# Patient Record
Sex: Female | Born: 1945 | Race: White | Hispanic: No | Smoking: Current every day smoker
Health system: Southern US, Community
[De-identification: ages and names within clinical notes are randomized; demographics above are authoritative.]

## PROBLEM LIST (undated history)

## (undated) DIAGNOSIS — M109 Gout, unspecified: Secondary | ICD-10-CM

## (undated) DIAGNOSIS — G8929 Other chronic pain: Secondary | ICD-10-CM

## (undated) DIAGNOSIS — J45909 Unspecified asthma, uncomplicated: Secondary | ICD-10-CM

## (undated) DIAGNOSIS — G2581 Restless legs syndrome: Secondary | ICD-10-CM

## (undated) DIAGNOSIS — K5792 Diverticulitis of intestine, part unspecified, without perforation or abscess without bleeding: Secondary | ICD-10-CM

## (undated) DIAGNOSIS — Z9289 Personal history of other medical treatment: Secondary | ICD-10-CM

## (undated) DIAGNOSIS — E039 Hypothyroidism, unspecified: Secondary | ICD-10-CM

## (undated) DIAGNOSIS — K760 Fatty (change of) liver, not elsewhere classified: Secondary | ICD-10-CM

## (undated) DIAGNOSIS — M545 Low back pain, unspecified: Secondary | ICD-10-CM

## (undated) DIAGNOSIS — IMO0001 Reserved for inherently not codable concepts without codable children: Secondary | ICD-10-CM

## (undated) DIAGNOSIS — E119 Type 2 diabetes mellitus without complications: Secondary | ICD-10-CM

## (undated) DIAGNOSIS — K5909 Other constipation: Secondary | ICD-10-CM

## (undated) DIAGNOSIS — F329 Major depressive disorder, single episode, unspecified: Secondary | ICD-10-CM

## (undated) DIAGNOSIS — M1990 Unspecified osteoarthritis, unspecified site: Secondary | ICD-10-CM

## (undated) DIAGNOSIS — F419 Anxiety disorder, unspecified: Secondary | ICD-10-CM

## (undated) DIAGNOSIS — C73 Malignant neoplasm of thyroid gland: Secondary | ICD-10-CM

## (undated) DIAGNOSIS — F17210 Nicotine dependence, cigarettes, uncomplicated: Secondary | ICD-10-CM

## (undated) DIAGNOSIS — D509 Iron deficiency anemia, unspecified: Secondary | ICD-10-CM

## (undated) DIAGNOSIS — E785 Hyperlipidemia, unspecified: Secondary | ICD-10-CM

## (undated) DIAGNOSIS — E1140 Type 2 diabetes mellitus with diabetic neuropathy, unspecified: Secondary | ICD-10-CM

## (undated) DIAGNOSIS — J449 Chronic obstructive pulmonary disease, unspecified: Secondary | ICD-10-CM

## (undated) DIAGNOSIS — R251 Tremor, unspecified: Secondary | ICD-10-CM

## (undated) DIAGNOSIS — M722 Plantar fascial fibromatosis: Secondary | ICD-10-CM

## (undated) DIAGNOSIS — K219 Gastro-esophageal reflux disease without esophagitis: Secondary | ICD-10-CM

## (undated) DIAGNOSIS — R945 Abnormal results of liver function studies: Secondary | ICD-10-CM

## (undated) DIAGNOSIS — G4700 Insomnia, unspecified: Secondary | ICD-10-CM

## (undated) DIAGNOSIS — I10 Essential (primary) hypertension: Secondary | ICD-10-CM

## (undated) DIAGNOSIS — R7989 Other specified abnormal findings of blood chemistry: Secondary | ICD-10-CM

## (undated) DIAGNOSIS — Z972 Presence of dental prosthetic device (complete) (partial): Secondary | ICD-10-CM

## (undated) DIAGNOSIS — J42 Unspecified chronic bronchitis: Secondary | ICD-10-CM

## (undated) DIAGNOSIS — F32A Depression, unspecified: Secondary | ICD-10-CM

## (undated) DIAGNOSIS — Z87891 Personal history of nicotine dependence: Principal | ICD-10-CM

## (undated) HISTORY — DX: Restless legs syndrome: G25.81

## (undated) HISTORY — PX: TUBAL LIGATION: SHX77

## (undated) HISTORY — DX: Abnormal results of liver function studies: R94.5

## (undated) HISTORY — DX: Personal history of nicotine dependence: Z87.891

## (undated) HISTORY — DX: Chronic obstructive pulmonary disease, unspecified: J44.9

## (undated) HISTORY — DX: Iron deficiency anemia, unspecified: D50.9

## (undated) HISTORY — DX: Type 2 diabetes mellitus with diabetic neuropathy, unspecified: E11.40

## (undated) HISTORY — DX: Insomnia, unspecified: G47.00

## (undated) HISTORY — DX: Tremor, unspecified: R25.1

## (undated) HISTORY — DX: Low back pain, unspecified: M54.50

## (undated) HISTORY — DX: Other chronic pain: G89.29

## (undated) HISTORY — DX: Low back pain: M54.5

## (undated) HISTORY — DX: Plantar fascial fibromatosis: M72.2

## (undated) HISTORY — PX: COLONOSCOPY: SHX174

## (undated) HISTORY — DX: Gastro-esophageal reflux disease without esophagitis: K21.9

## (undated) HISTORY — PX: UPPER GASTROINTESTINAL ENDOSCOPY: SHX188

## (undated) HISTORY — DX: Nicotine dependence, cigarettes, uncomplicated: F17.210

## (undated) HISTORY — DX: Anxiety disorder, unspecified: F41.9

## (undated) HISTORY — DX: Personal history of other medical treatment: Z92.89

## (undated) HISTORY — DX: Essential (primary) hypertension: I10

## (undated) HISTORY — DX: Fatty (change of) liver, not elsewhere classified: K76.0

## (undated) HISTORY — DX: Other specified abnormal findings of blood chemistry: R79.89

## (undated) HISTORY — DX: Malignant neoplasm of thyroid gland: C73

## (undated) HISTORY — DX: Unspecified chronic bronchitis: J42

## (undated) HISTORY — DX: Type 2 diabetes mellitus without complications: E11.9

## (undated) HISTORY — DX: Diverticulitis of intestine, part unspecified, without perforation or abscess without bleeding: K57.92

## (undated) HISTORY — DX: Hyperlipidemia, unspecified: E78.5

## (undated) HISTORY — DX: Other constipation: K59.09

---

## 2004-09-26 ENCOUNTER — Ambulatory Visit: Payer: Self-pay | Admitting: Internal Medicine

## 2005-11-28 ENCOUNTER — Ambulatory Visit: Payer: Self-pay | Admitting: Obstetrics and Gynecology

## 2005-12-11 ENCOUNTER — Ambulatory Visit: Payer: Self-pay | Admitting: Obstetrics and Gynecology

## 2007-04-07 ENCOUNTER — Ambulatory Visit: Payer: Self-pay | Admitting: Gastroenterology

## 2007-05-01 DIAGNOSIS — C73 Malignant neoplasm of thyroid gland: Secondary | ICD-10-CM

## 2007-05-01 HISTORY — PX: THYROIDECTOMY: SHX17

## 2007-05-01 HISTORY — PX: POLYPECTOMY: SHX149

## 2007-05-01 HISTORY — DX: Malignant neoplasm of thyroid gland: C73

## 2007-09-25 ENCOUNTER — Ambulatory Visit: Payer: Self-pay | Admitting: Internal Medicine

## 2007-10-09 ENCOUNTER — Ambulatory Visit: Payer: Self-pay | Admitting: Internal Medicine

## 2007-12-25 ENCOUNTER — Ambulatory Visit: Payer: Self-pay | Admitting: Unknown Physician Specialty

## 2008-10-20 ENCOUNTER — Ambulatory Visit: Payer: Self-pay | Admitting: Internal Medicine

## 2010-05-24 ENCOUNTER — Ambulatory Visit: Payer: Self-pay | Admitting: Internal Medicine

## 2011-06-25 ENCOUNTER — Ambulatory Visit: Payer: Self-pay | Admitting: Internal Medicine

## 2013-04-30 DIAGNOSIS — K5792 Diverticulitis of intestine, part unspecified, without perforation or abscess without bleeding: Secondary | ICD-10-CM

## 2013-04-30 HISTORY — DX: Diverticulitis of intestine, part unspecified, without perforation or abscess without bleeding: K57.92

## 2013-05-28 ENCOUNTER — Ambulatory Visit: Payer: Self-pay | Admitting: Internal Medicine

## 2013-05-28 DIAGNOSIS — Z9289 Personal history of other medical treatment: Secondary | ICD-10-CM

## 2013-05-28 HISTORY — DX: Personal history of other medical treatment: Z92.89

## 2014-01-25 ENCOUNTER — Ambulatory Visit: Payer: Self-pay | Admitting: Gastroenterology

## 2014-04-27 DIAGNOSIS — Z720 Tobacco use: Secondary | ICD-10-CM | POA: Insufficient documentation

## 2014-08-24 DIAGNOSIS — D649 Anemia, unspecified: Secondary | ICD-10-CM | POA: Insufficient documentation

## 2014-08-27 ENCOUNTER — Ambulatory Visit
Admit: 2014-08-27 | Disposition: A | Discharge status: Home / Self Care | Payer: Self-pay | Attending: Internal Medicine | Admitting: Internal Medicine

## 2015-03-08 ENCOUNTER — Other Ambulatory Visit: Payer: Self-pay | Admitting: *Deleted

## 2015-03-08 ENCOUNTER — Inpatient Hospital Stay: Payer: Medicare Other | Attending: Internal Medicine | Admitting: Internal Medicine

## 2015-03-08 ENCOUNTER — Encounter: Payer: Self-pay | Admitting: *Deleted

## 2015-03-08 ENCOUNTER — Inpatient Hospital Stay: Payer: Medicare Other

## 2015-03-08 VITALS — BP 140/62 | HR 109 | Temp 97.6°F | Resp 20 | Ht 64.96 in | Wt 165.1 lb

## 2015-03-08 DIAGNOSIS — R251 Tremor, unspecified: Secondary | ICD-10-CM | POA: Insufficient documentation

## 2015-03-08 DIAGNOSIS — D508 Other iron deficiency anemias: Secondary | ICD-10-CM

## 2015-03-08 DIAGNOSIS — J441 Chronic obstructive pulmonary disease with (acute) exacerbation: Secondary | ICD-10-CM | POA: Insufficient documentation

## 2015-03-08 DIAGNOSIS — E785 Hyperlipidemia, unspecified: Secondary | ICD-10-CM | POA: Insufficient documentation

## 2015-03-08 DIAGNOSIS — Z79899 Other long term (current) drug therapy: Secondary | ICD-10-CM | POA: Diagnosis not present

## 2015-03-08 DIAGNOSIS — G2581 Restless legs syndrome: Secondary | ICD-10-CM | POA: Diagnosis not present

## 2015-03-08 DIAGNOSIS — F17210 Nicotine dependence, cigarettes, uncomplicated: Secondary | ICD-10-CM | POA: Insufficient documentation

## 2015-03-08 DIAGNOSIS — F419 Anxiety disorder, unspecified: Secondary | ICD-10-CM | POA: Insufficient documentation

## 2015-03-08 DIAGNOSIS — D509 Iron deficiency anemia, unspecified: Secondary | ICD-10-CM | POA: Diagnosis not present

## 2015-03-08 DIAGNOSIS — Z7984 Long term (current) use of oral hypoglycemic drugs: Secondary | ICD-10-CM | POA: Insufficient documentation

## 2015-03-08 DIAGNOSIS — Z7982 Long term (current) use of aspirin: Secondary | ICD-10-CM | POA: Insufficient documentation

## 2015-03-08 DIAGNOSIS — K219 Gastro-esophageal reflux disease without esophagitis: Secondary | ICD-10-CM | POA: Diagnosis not present

## 2015-03-08 DIAGNOSIS — E1140 Type 2 diabetes mellitus with diabetic neuropathy, unspecified: Secondary | ICD-10-CM | POA: Diagnosis not present

## 2015-03-08 DIAGNOSIS — J449 Chronic obstructive pulmonary disease, unspecified: Secondary | ICD-10-CM | POA: Insufficient documentation

## 2015-03-08 DIAGNOSIS — I10 Essential (primary) hypertension: Secondary | ICD-10-CM | POA: Insufficient documentation

## 2015-03-08 DIAGNOSIS — E119 Type 2 diabetes mellitus without complications: Secondary | ICD-10-CM | POA: Insufficient documentation

## 2015-03-08 DIAGNOSIS — C73 Malignant neoplasm of thyroid gland: Secondary | ICD-10-CM | POA: Insufficient documentation

## 2015-03-08 DIAGNOSIS — D500 Iron deficiency anemia secondary to blood loss (chronic): Secondary | ICD-10-CM | POA: Insufficient documentation

## 2015-03-08 LAB — COMPREHENSIVE METABOLIC PANEL
ALK PHOS: 107 U/L (ref 38–126)
ALT: 29 U/L (ref 14–54)
AST: 65 U/L — ABNORMAL HIGH (ref 15–41)
Albumin: 3.9 g/dL (ref 3.5–5.0)
Anion gap: 12 (ref 5–15)
BUN: 14 mg/dL (ref 6–20)
CALCIUM: 8.9 mg/dL (ref 8.9–10.3)
CO2: 27 mmol/L (ref 22–32)
Chloride: 96 mmol/L — ABNORMAL LOW (ref 101–111)
Creatinine, Ser: 0.73 mg/dL (ref 0.44–1.00)
GFR calc non Af Amer: >60 mL/min (ref >60)
Glucose, Bld: 129 mg/dL — ABNORMAL HIGH (ref 65–99)
Potassium: 4.2 mmol/L (ref 3.5–5.1)
Sodium: 135 mmol/L (ref 135–145)
Total Bilirubin: 0.4 mg/dL (ref 0.3–1.2)
Total Protein: 8.2 g/dL — ABNORMAL HIGH (ref 6.5–8.1)

## 2015-03-08 LAB — CBC WITH DIFFERENTIAL/PLATELET
Basophils Absolute: 0.1 10*3/uL (ref 0–0.1)
EOS ABS: 0.2 10*3/uL (ref 0–0.7)
Eosinophils Relative: 3 %
HCT: 27.8 % — ABNORMAL LOW (ref 35.0–47.0)
Hemoglobin: 8.5 g/dL — ABNORMAL LOW (ref 12.0–16.0)
Lymphocytes Relative: 11 %
Lymphs Abs: 0.9 10*3/uL — ABNORMAL LOW (ref 1.0–3.6)
MCH: 21.3 pg — ABNORMAL LOW (ref 26.0–34.0)
MCHC: 30.7 g/dL — AB (ref 32.0–36.0)
MCV: 69.5 fL — ABNORMAL LOW (ref 80.0–100.0)
Monocytes Absolute: 0.4 10*3/uL (ref 0.2–0.9)
Neutro Abs: 6.9 10*3/uL — ABNORMAL HIGH (ref 1.4–6.5)
Platelets: 322 10*3/uL (ref 150–440)
RBC: 4 MIL/uL (ref 3.80–5.20)
RDW: 19.8 % — ABNORMAL HIGH (ref 11.5–14.5)
WBC: 8.6 10*3/uL (ref 3.6–11.0)

## 2015-03-08 LAB — LACTATE DEHYDROGENASE: LDH: 121 U/L (ref 98–192)

## 2015-03-08 LAB — FERRITIN: FERRITIN: 15 ng/mL (ref 11–307)

## 2015-03-08 NOTE — Patient Instructions (Addendum)
Iron Deficiency Anemia, Adult Anemia is when you have a low number of healthy red blood cells. It is often caused by too little iron. This is called iron deficiency anemia. It may make you tired and short of breath. HOME CARE   Take iron as told by your doctor.  Take vitamins as told by your doctor.  Eat foods that have iron in them. This includes liver, lean beef, whole-grain bread, eggs, dried fruit, and dark green leafy vegetables. GET HELP RIGHT AWAY IF:  You pass out (faint).  You have chest pain.  You feel sick to your stomach (nauseous) or throw up (vomit).  You get very short of breath with activity.  You are weak.  You have a fast heartbeat.  You start to sweat for no reason.  You become light-headed when getting up from a chair or bed. MAKE SURE YOU:  Understand these instructions.  Will watch your condition.  Will get help right away if you are not doing well or get worse.   This information is not intended to replace advice given to you by your health care provider. Make sure you discuss any questions you have with your health care provider.   Document Released: 05/19/2010 Document Revised: 05/07/2014 Document Reviewed: 12/22/2012 Elsevier Interactive Patient Education 2016 [place] [employer] What is this medicine? FERUMOXYTOL is an iron complex. Iron is used to make healthy red blood cells, which carry oxygen and nutrients throughout the body. This medicine is used to treat iron deficiency anemia in people with chronic kidney disease. This medicine may be used for other purposes; ask your health care provider or pharmacist if you have questions. What should I tell my health care provider before I take this medicine? They need to know if you have any of these conditions: -anemia not caused by low iron levels -high levels of iron in the blood -magnetic resonance imaging (MRI) test scheduled -an unusual or allergic reaction to iron, other  medicines, foods, dyes, or preservatives -pregnant or trying to get pregnant -breast-feeding How should I use this medicine? This medicine is for injection into a vein. It is given by a health care professional in a hospital or clinic setting. Talk to your pediatrician regarding the use of this medicine in children. Special care may be needed. Overdosage: If you think you have taken too much of this medicine contact a poison control center or emergency room at once. NOTE: This medicine is only for you. Do not share this medicine with others. What if I miss a dose? It is important not to miss your dose. Call your doctor or health care professional if you are unable to keep an appointment. What may interact with this medicine? This medicine may interact with the following medications: -other iron products This list may not describe all possible interactions. Give your health care provider a list of all the medicines, herbs, non-prescription drugs, or dietary supplements you use. Also tell them if you smoke, drink alcohol, or use illegal drugs. Some items may interact with your medicine. What should I watch for while using this medicine? Visit your doctor or healthcare professional regularly. Tell your doctor or healthcare professional if your symptoms do not start to get better or if they get worse. You may need blood work done while you are taking this medicine. You may need to follow a special diet. Talk to your doctor. Foods that contain iron include: whole grains/cereals, dried fruits, beans, or peas, leafy green  vegetables, and organ meats (liver, kidney). What side effects may I notice from receiving this medicine? Side effects that you should report to your doctor or health care professional as soon as possible: -allergic reactions like skin rash, itching or hives, swelling of the face, lips, or tongue -breathing problems -changes in blood pressure -feeling faint or lightheaded,  falls -fever or chills -flushing, sweating, or hot feelings -swelling of the ankles or feet Side effects that usually do not require medical attention (Report these to your doctor or health care professional if they continue or are bothersome.): -diarrhea -headache -nausea, vomiting -stomach pain This list may not describe all possible side effects. Call your doctor for medical advice about side effects. You may report side effects to FDA at 1-800-FDA-1088. Where should I keep my medicine? This drug is given in a hospital or clinic and will not be stored at home. NOTE: This sheet is a summary. It may not cover all possible information. If you have questions about this medicine, talk to your doctor, pharmacist, or health care provider.    2016, Elsevier/Gold Standard. (2011-11-30 15:23:36)

## 2015-03-08 NOTE — Progress Notes (Signed)
Clarification of lab order. md would like to order celiac panel test # [phone]. Lab department informed.

## 2015-03-08 NOTE — Progress Notes (Signed)
[facility] CONSULT NOTE  No care team member to display  CHIEF COMPLAINTS/PURPOSE OF CONSULTATION: Anemia   # AUG 2015- IRON DEFICIENCY ANEMIA- [Oct 2016 hb~8.7;MCV-76 Ferritin- 13] s/p colo [2015] & EGD;   # Thyroid ca s/p S & RAIU [June 2009]  # Chronic tremors; Active smoker  HISTORY OF PRESENTING ILLNESS:  Martha [name] 69 y.o. pleasant female patient noted to have iron deficiency anemia more profound in the last 4 months has been referred to [place] for further evaluation. Patient denies any blood in stools black stools. She also had a stool occult cards checked the negative. She had EGD and colonoscopies more recently this year again no active source of bleeding was found. Patient also had a CT scan of the abdomen in April 2016 that did not show any concerning kidney lesions.   Given the iron deficiency that was noted 4 months ago she was started on by mouth iron; however her hemoglobin most recently checked in October 2016 was 8.7 MCV 76.  Patient complains of extreme fatigue. Especially with exertion she gets short of breath. Patient denies any significant weight loss. No nausea no vomiting no difficulty swallowing.   ROS: A complete 10 point review of system is done which is negative except mentioned above in history of present illness  MEDICAL HISTORY:  Past Medical History  Diagnosis Date  . Type 2 diabetes mellitus ([place])   . Iron deficiency anemia   . Abnormal LFTs (liver function tests)   . Fatty liver   . Plantar fasciitis   . Diabetic neuropathy ([place])   . Chronic low back pain   . Essential hypertension   . Thyroid cancer ([place]) 2009  . Tremors of nervous system     "I think I have parkinson's disease"  . Anxiety   . Insomnia   . COPD (chronic obstructive pulmonary disease) ([place])   . Cigarette smoker     Has cut back Smoking to 1 pack every other day  . Low serum vitamin D   . Hyperlipidemia   . Adenomatous polyp of colon 2009  . Chronic bronchitis  ([place])   . Chronic constipation   . GERD (gastroesophageal reflux disease)   . RLS (restless legs syndrome)   . Diverticulitis 2015    gi recommended repeat scope in 10 years    SURGICAL HISTORY: Past Surgical History  Procedure Laterality Date  . Thyroidectomy  2009    states she was tx with radiactive iodine  . Colonoscopy  2004, 2009, 2015    Adenoma  . Tubal ligation    . Polypectomy  2009  . Upper gastrointestinal endoscopy      SOCIAL HISTORY: Social History   Social History  . Marital Status: Divorced    Spouse Name: N/A  . Number of Children: N/A  . Years of Education: N/A   Occupational History  . Not on file.   Social History Main Topics  . Smoking status: Current Every Day Smoker -- 0.50 packs/day for 30 years    Types: Cigarettes  . Smokeless tobacco: Never Used     Comment: curently smokes 12 cigerattes a day. I'm trying to cut back.  . Alcohol Use: No  . Drug Use: No  . Sexual Activity: No   Other Topics Concern  . Not on file   Social History Narrative  . No narrative on file    FAMILY HISTORY: Family History  Problem Relation Age of Onset  . Diabetes Mellitus  II Mother   . Arthritis Mother   . Diabetes Mellitus II Father   . Lung disease Father   . Thyroid disease    . Asthma Sister   . Emphysema Sister   . Hypertension Sister     ALLERGIES:  is allergic to codeine sulfate; nicotine polacrilex; nicotrol; and metformin.  MEDICATIONS:  Current Outpatient Prescriptions  Medication Sig Dispense Refill  . Alcohol Swabs (ALCOHOL PREP) 70 % PADS daily.  99  . ALPRAZolam (XANAX) 0.25 MG tablet Take 1 tablet by mouth at bedtime as needed. Sleep/insomnia    . aspirin EC 81 MG tablet Take 1 tablet by mouth daily.    . cetirizine (ZYRTEC) 10 MG tablet Take 1 tablet by mouth daily.    . Cyanocobalamin (RA VITAMIN B-12 TR) 1000 MCG TBCR Take 1 tablet by mouth daily.    [name] Calcium (STOOL SOFTENER PO) Take 1 capsule by mouth daily.    Marland Kitchen  EASY COMFORT LANCETS MISC daily. for testing  99  . ferrous sulfate 325 (65 FE) MG EC tablet Take 1 tablet by mouth daily.    Marland Kitchen gabapentin (NEURONTIN) 400 MG capsule Take 1 capsule by mouth 3 (three) times daily.    Marland Kitchen glipiZIDE (GLUCOTROL) 10 MG tablet Take 1 tablet by mouth 2 (two) times daily.    Marland Kitchen glucose blood test strip     . hydrochlorothiazide (HYDRODIURIL) 25 MG tablet Take 1 tablet by mouth daily.    Marland Kitchen levocetirizine (XYZAL) 5 MG tablet Take 1 tablet by mouth daily.    Marland Kitchen levothyroxine (SYNTHROID, LEVOTHROID) 150 MCG tablet Take 1 tablet by mouth daily.    . metFORMIN (GLUCOPHAGE) 1000 MG tablet Take 1 tablet by mouth 2 (two) times daily.    . ONE TOUCH ULTRA TEST test strip daily. for testing  99  . PROAIR HFA 108 (90 BASE) MCG/ACT inhaler Inhale 2 Inhalers into the lungs every 6 (six) hours as needed. Shortness of breath    . Probiotic Product (ALIGN) 4 MG CAPS Take 1 capsule by mouth daily.    . valsartan (DIOVAN) 160 MG tablet Take 1 tablet by mouth daily.    . Vitamin D, Ergocalciferol, (DRISDOL) 50000 UNITS CAPS capsule Take 1 capsule by mouth once a week.     No current facility-administered medications for this visit.      Marland Kitchen  PHYSICAL EXAMINATION: ECOG PERFORMANCE STATUS: 1 - Symptomatic but completely ambulatory  Filed Vitals:   03/08/15 1015  BP: 140/62  Pulse: 109  Temp: 97.6 F (36.4 C)  Resp: 20   Filed Weights   03/08/15 1015  Weight: 165 lb 2 oz (74.9 kg)    GENERAL: Well-nourished well-developed; Alert, no distress and comfortable.  She walks with a cane.She has chronic tremors of her head.  EYES: no pallor or icterus OROPHARYNX: no thrush or ulceration; poor dentition NECK: supple, no masses felt LYMPH:  no palpable lymphadenopathy in the cervical, axillary or inguinal regions LUNGS: Decreased breath sounds bilaterally No wheeze or crackles HEART/CVS: regular rate & rhythm and no murmurs; No lower extremity edema ABDOMEN: abdomen soft, non-tender  and normal bowel sounds Musculoskeletal:no cyanosis of digits and no clubbing  PSYCH: alert & oriented x 3 with fluent speech NEURO: no focal motor/sensory deficits SKIN:  no rashes or significant lesions  LABORATORY DATA:  I have reviewed the data as listed No results found for: WBC, HGB, HCT, MCV, PLT No results for input(s): NA, K, CL, CO2, GLUCOSE, BUN, CREATININE, CALCIUM,  GFRNONAA, GFRAA, PROT, ALBUMIN, AST, ALT, ALKPHOS, BILITOT, BILIDIR, IBILI in the last 8760 hours.  RADIOGRAPHIC STUDIES: I have personally reviewed the radiological images as listed and agreed with the findings in the report. No results found.  ASSESSMENT & PLAN:   # Iron deficiency anemia- unclear etiology. Check CBC CMP and LDH and reticulocyte count; celiac panel to rule out malabsorption. As patient is not responding to by mouth iron; I recommend IV iron.   # Patient will need IV iron/Feraheme next week 2 doses; check CBC in 1 month; and also CBC ferritin in 2 months/follow-up with me.  # Discussed quitting smoking- patient not interested.   All questions were answered. The patient knows to call the clinic with any problems, questions or concerns.  Thank you Dr.Hande for allowing me to participate in the care of your pleasant patient. Please do not hesitate to contact me with questions or concerns in the interim.  I spent 20 minutes counseling the patient face to face. The total time spent in the appointment was 30 minutes and more than 50% was on counseling.     Cammie Sickle, MD 03/08/2015 10:37 AM

## 2015-03-08 NOTE — Progress Notes (Signed)
Patient education on iron deficiency anemia and fereheme. RN spent 10 minutes educating patient. Teach back process performed with patient.

## 2015-03-08 NOTE — Progress Notes (Signed)
Patient states that she has a rash with metformin in the past, but has able to tolerate this drug without further complaints.  Patient has been on oral iron for only 4 months. Patient states that she has never had to have a blood transfusion.  The patient reports easy bruising. She is a chronic smoker. She has cut back to 12 cigarettes a day. She states that she wants to try to "quit on her own." She reports fatigue and dyspnea with mild to moderate exertion. She does not use any oxygen at home.

## 2015-03-09 LAB — RETICULOCYTES
RBC.: 4.08 MIL/uL (ref 3.80–5.20)
Retic Count, Absolute: 134.6 10*3/uL (ref 19.0–183.0)
Retic Ct Pct: 3.3 % — ABNORMAL HIGH (ref 0.4–3.1)

## 2015-03-10 LAB — CELIAC DISEASE PANEL
Endomysial Ab, IgA: NEGATIVE
IgA: 348 mg/dL (ref 87–352)
Tissue Transglutaminase Ab, IgA: <2 U/mL (ref 0–3)

## 2015-03-15 ENCOUNTER — Inpatient Hospital Stay: Payer: Medicare Other

## 2015-03-15 VITALS — BP 140/58 | HR 100 | Temp 97.0°F | Resp 20

## 2015-03-15 DIAGNOSIS — D508 Other iron deficiency anemias: Secondary | ICD-10-CM

## 2015-03-15 DIAGNOSIS — D509 Iron deficiency anemia, unspecified: Secondary | ICD-10-CM | POA: Diagnosis not present

## 2015-03-15 MED ORDER — SODIUM CHLORIDE 0.9 % IV SOLN
510.0000 mg | Freq: Once | INTRAVENOUS | Status: AC
  Administered 2015-03-15: 510 mg via INTRAVENOUS
  Filled 2015-03-15: qty 17

## 2015-03-15 NOTE — Patient Instructions (Signed)

## 2015-03-22 ENCOUNTER — Inpatient Hospital Stay: Payer: Medicare Other

## 2015-03-22 VITALS — BP 123/58 | HR 93 | Temp 96.4°F | Resp 20

## 2015-03-22 DIAGNOSIS — D509 Iron deficiency anemia, unspecified: Secondary | ICD-10-CM | POA: Diagnosis not present

## 2015-03-22 DIAGNOSIS — D508 Other iron deficiency anemias: Secondary | ICD-10-CM

## 2015-03-22 MED ORDER — SODIUM CHLORIDE 0.9 % IV SOLN
510.0000 mg | Freq: Once | INTRAVENOUS | Status: AC
  Administered 2015-03-22: 510 mg via INTRAVENOUS
  Filled 2015-03-22: qty 17

## 2015-04-05 ENCOUNTER — Inpatient Hospital Stay: Payer: Medicare Other | Attending: Internal Medicine

## 2015-04-05 DIAGNOSIS — D508 Other iron deficiency anemias: Secondary | ICD-10-CM

## 2015-04-05 DIAGNOSIS — D509 Iron deficiency anemia, unspecified: Secondary | ICD-10-CM | POA: Insufficient documentation

## 2015-04-05 DIAGNOSIS — Z79899 Other long term (current) drug therapy: Secondary | ICD-10-CM | POA: Insufficient documentation

## 2015-04-05 LAB — CBC WITH DIFFERENTIAL/PLATELET
BASOS PCT: 1 %
Basophils Absolute: 0.1 10*3/uL (ref 0–0.1)
EOS ABS: 0.2 10*3/uL (ref 0–0.7)
EOS PCT: 2 %
HCT: 36.8 % (ref 35.0–47.0)
HEMOGLOBIN: 11.7 g/dL — AB (ref 12.0–16.0)
LYMPHS ABS: 0.9 10*3/uL — AB (ref 1.0–3.6)
Lymphocytes Relative: 12 %
MCH: 26.4 pg (ref 26.0–34.0)
MCHC: 31.9 g/dL — ABNORMAL LOW (ref 32.0–36.0)
MCV: 82.8 fL (ref 80.0–100.0)
Monocytes Absolute: 0.4 10*3/uL (ref 0.2–0.9)
Monocytes Relative: 5 %
NEUTROS PCT: 80 %
Neutro Abs: 6.1 10*3/uL (ref 1.4–6.5)
PLATELETS: 272 10*3/uL (ref 150–440)
RBC: 4.44 MIL/uL (ref 3.80–5.20)
RDW: 29.5 % — ABNORMAL HIGH (ref 11.5–14.5)
WBC: 7.8 10*3/uL (ref 3.6–11.0)

## 2015-05-06 ENCOUNTER — Inpatient Hospital Stay: Payer: Medicare Other | Attending: Internal Medicine

## 2015-05-06 DIAGNOSIS — F17210 Nicotine dependence, cigarettes, uncomplicated: Secondary | ICD-10-CM | POA: Insufficient documentation

## 2015-05-06 DIAGNOSIS — Z7982 Long term (current) use of aspirin: Secondary | ICD-10-CM | POA: Diagnosis not present

## 2015-05-06 DIAGNOSIS — J449 Chronic obstructive pulmonary disease, unspecified: Secondary | ICD-10-CM | POA: Insufficient documentation

## 2015-05-06 DIAGNOSIS — Z7984 Long term (current) use of oral hypoglycemic drugs: Secondary | ICD-10-CM | POA: Diagnosis not present

## 2015-05-06 DIAGNOSIS — D509 Iron deficiency anemia, unspecified: Secondary | ICD-10-CM | POA: Insufficient documentation

## 2015-05-06 DIAGNOSIS — D508 Other iron deficiency anemias: Secondary | ICD-10-CM

## 2015-05-06 DIAGNOSIS — K5909 Other constipation: Secondary | ICD-10-CM | POA: Diagnosis not present

## 2015-05-06 DIAGNOSIS — Z79899 Other long term (current) drug therapy: Secondary | ICD-10-CM | POA: Insufficient documentation

## 2015-05-06 DIAGNOSIS — E1140 Type 2 diabetes mellitus with diabetic neuropathy, unspecified: Secondary | ICD-10-CM | POA: Diagnosis not present

## 2015-05-06 DIAGNOSIS — Z85850 Personal history of malignant neoplasm of thyroid: Secondary | ICD-10-CM | POA: Diagnosis not present

## 2015-05-06 DIAGNOSIS — I10 Essential (primary) hypertension: Secondary | ICD-10-CM | POA: Insufficient documentation

## 2015-05-06 DIAGNOSIS — K219 Gastro-esophageal reflux disease without esophagitis: Secondary | ICD-10-CM | POA: Insufficient documentation

## 2015-05-06 DIAGNOSIS — E785 Hyperlipidemia, unspecified: Secondary | ICD-10-CM | POA: Insufficient documentation

## 2015-05-06 DIAGNOSIS — G2581 Restless legs syndrome: Secondary | ICD-10-CM | POA: Diagnosis not present

## 2015-05-06 LAB — CBC WITH DIFFERENTIAL/PLATELET
Basophils Absolute: 0.1 10*3/uL (ref 0–0.1)
Basophils Relative: 1 %
Eosinophils Absolute: 0.3 10*3/uL (ref 0–0.7)
Eosinophils Relative: 4 %
HCT: 37.9 % (ref 35.0–47.0)
Hemoglobin: 12.4 g/dL (ref 12.0–16.0)
LYMPHS PCT: 16 %
Lymphs Abs: 1.1 10*3/uL (ref 1.0–3.6)
MCH: 27.2 pg (ref 26.0–34.0)
MCHC: 32.7 g/dL (ref 32.0–36.0)
MCV: 83.3 fL (ref 80.0–100.0)
MONO ABS: 0.4 10*3/uL (ref 0.2–0.9)
MONOS PCT: 5 %
NEUTROS ABS: 5.2 10*3/uL (ref 1.4–6.5)
Neutrophils Relative %: 74 %
Platelets: 238 10*3/uL (ref 150–440)
RBC: 4.55 MIL/uL (ref 3.80–5.20)
RDW: 21.9 % — AB (ref 11.5–14.5)
WBC: 7 10*3/uL (ref 3.6–11.0)

## 2015-05-06 LAB — FERRITIN: Ferritin: 31 ng/mL (ref 11–307)

## 2015-05-10 ENCOUNTER — Ambulatory Visit: Payer: Medicare Other

## 2015-05-10 ENCOUNTER — Ambulatory Visit: Payer: Medicare Other | Admitting: Internal Medicine

## 2015-05-17 ENCOUNTER — Inpatient Hospital Stay: Payer: Medicare Other

## 2015-05-17 ENCOUNTER — Encounter: Payer: Self-pay | Admitting: Internal Medicine

## 2015-05-17 ENCOUNTER — Inpatient Hospital Stay (HOSPITAL_BASED_OUTPATIENT_CLINIC_OR_DEPARTMENT_OTHER): Payer: Medicare Other | Admitting: Internal Medicine

## 2015-05-17 VITALS — BP 139/59 | HR 96 | Temp 98.1°F | Resp 21 | Wt 158.7 lb

## 2015-05-17 VITALS — BP 137/71 | HR 89 | Temp 97.1°F | Resp 16

## 2015-05-17 DIAGNOSIS — Z85850 Personal history of malignant neoplasm of thyroid: Secondary | ICD-10-CM | POA: Diagnosis not present

## 2015-05-17 DIAGNOSIS — Z79899 Other long term (current) drug therapy: Secondary | ICD-10-CM | POA: Diagnosis not present

## 2015-05-17 DIAGNOSIS — D509 Iron deficiency anemia, unspecified: Secondary | ICD-10-CM | POA: Diagnosis not present

## 2015-05-17 DIAGNOSIS — D649 Anemia, unspecified: Secondary | ICD-10-CM

## 2015-05-17 DIAGNOSIS — D508 Other iron deficiency anemias: Secondary | ICD-10-CM

## 2015-05-17 DIAGNOSIS — F17210 Nicotine dependence, cigarettes, uncomplicated: Secondary | ICD-10-CM | POA: Diagnosis not present

## 2015-05-17 MED ORDER — SODIUM CHLORIDE 0.9 % IV SOLN
510.0000 mg | Freq: Once | INTRAVENOUS | Status: AC
  Administered 2015-05-17: 510 mg via INTRAVENOUS
  Filled 2015-05-17: qty 17

## 2015-05-17 MED ORDER — SODIUM CHLORIDE 0.9 % IV SOLN
Freq: Once | INTRAVENOUS | Status: AC
  Administered 2015-05-17: 11:00:00 via INTRAVENOUS
  Filled 2015-05-17: qty 1000

## 2015-05-17 NOTE — Progress Notes (Signed)
[facility] CONSULT NOTE  No care team member to display  CHIEF COMPLAINTS/PURPOSE OF CONSULTATION: Anemia   # AUG 2015- IRON DEFICIENCY ANEMIA- [Oct 2016 hb~8.7;MCV-76 Ferritin- 13] s/p colo [2015] & EGD; April 2016- CT A/P- NEG; NOV 2016- Hb- 8.5/15-Ferritin-[place] x2;  # Thyroid ca s/p S & RAIU [June 2009]  # Chronic tremors; Active smoker  HISTORY OF PRESENTING ILLNESS:  Martha [name] 70 y.o. pleasant female 70 year old with iron deficiency anemia of unclear etiology [question malabsorption] is here for follow-up.  Patient had IV iron infusion in November 2016- with significant improvement of her hemoglobin to 12.4 with a ferritin of 31. Patient has significantly feeling better since the IV iron infusion. She is having better energy; she is able to walk better/study with a cane.   ROS: A complete 10 point review of system is done which is negative except mentioned above in history of present illness  MEDICAL HISTORY:  Past Medical History  Diagnosis Date  . Type 2 diabetes mellitus ([place])   . Iron deficiency anemia   . Abnormal LFTs (liver function tests)   . Fatty liver   . Plantar fasciitis   . Diabetic neuropathy ([place])   . Chronic low back pain   . Essential hypertension   . Thyroid cancer ([place]) 2009  . Tremors of nervous system     "I think I have parkinson's disease"  . Anxiety   . Insomnia   . COPD (chronic obstructive pulmonary disease) ([place])   . Cigarette smoker     Has cut back Smoking to 1 pack every other day  . Low serum vitamin D   . Hyperlipidemia   . Adenomatous polyp of colon 2009  . Chronic bronchitis ([place])   . Chronic constipation   . GERD (gastroesophageal reflux disease)   . RLS (restless legs syndrome)   . Diverticulitis 2015    gi recommended repeat scope in 10 years  . History of mammogram 05/28/2013    SURGICAL HISTORY: Past Surgical History  Procedure Laterality Date  . Thyroidectomy  2009    states she was tx with  radiactive iodine  . Colonoscopy  2004, 2009, 2015    Adenoma  . Tubal ligation    . Polypectomy  2009  . Upper gastrointestinal endoscopy      SOCIAL HISTORY: Social History   Social History  . Marital Status: Divorced    Spouse Name: N/A  . Number of Children: N/A  . Years of Education: N/A   Occupational History  . Not on file.   Social History Main Topics  . Smoking status: Current Every Day Smoker -- 0.50 packs/day for 30 years    Types: Cigarettes  . Smokeless tobacco: Never Used     Comment: curently smokes 12 cigerattes a day. I'm trying to cut back.  . Alcohol Use: No  . Drug Use: No  . Sexual Activity: No   Other Topics Concern  . Not on file   Social History Narrative    FAMILY HISTORY: Family History  Problem Relation Age of Onset  . Diabetes Mellitus II Mother   . Arthritis Mother   . Diabetes Mellitus II Father   . Lung disease Father   . Thyroid disease    . Asthma Sister   . Emphysema Sister   . Hypertension Sister     ALLERGIES:  is allergic to codeine sulfate; nicotine polacrilex; nicotrol; and metformin.  MEDICATIONS:  Current Outpatient Prescriptions  Medication Sig Dispense  Refill  . Alcohol Swabs (ALCOHOL PREP) 70 % PADS daily.  99  . ALPRAZolam (XANAX) 0.25 MG tablet Take 1 tablet by mouth at bedtime as needed. Sleep/insomnia    . aspirin EC 81 MG tablet Take 1 tablet by mouth daily.    . cetirizine (ZYRTEC) 10 MG tablet Take 1 tablet by mouth daily.    Martha [name] Calcium (STOOL SOFTENER PO) Take 1 capsule by mouth daily.    Marland Kitchen EASY COMFORT LANCETS MISC daily. for testing  99  . gabapentin (NEURONTIN) 400 MG capsule Take 1 capsule by mouth 3 (three) times daily.    Marland Kitchen glipiZIDE (GLUCOTROL) 10 MG tablet Take 1 tablet by mouth 2 (two) times daily.    Marland Kitchen glucose blood test strip     . hydrochlorothiazide (HYDRODIURIL) 25 MG tablet Take 1 tablet by mouth daily.    Marland Kitchen levothyroxine (SYNTHROID, LEVOTHROID) 150 MCG tablet Take 1 tablet by  mouth daily.    . metFORMIN (GLUCOPHAGE) 1000 MG tablet Take 1 tablet by mouth 2 (two) times daily.    . ONE TOUCH ULTRA TEST test strip daily. for testing  99  . PROAIR HFA 108 (90 BASE) MCG/ACT inhaler Inhale 2 Inhalers into the lungs every 6 (six) hours as needed. Shortness of breath    . Probiotic Product (ALIGN) 4 MG CAPS Take 1 capsule by mouth daily.    . valsartan (DIOVAN) 160 MG tablet Take 1 tablet by mouth daily.    . vitamin C (ASCORBIC ACID) 500 MG tablet Take 500 mg by mouth daily.    . Vitamin D, Ergocalciferol, (DRISDOL) 50000 UNITS CAPS capsule Take 1 capsule by mouth once a week.    . ferrous sulfate 325 (65 FE) MG EC tablet Take 1 tablet by mouth daily.    Marland Kitchen levocetirizine (XYZAL) 5 MG tablet Take 1 tablet by mouth daily. Reported on 05/17/2015     No current facility-administered medications for this visit.      Marland Kitchen  PHYSICAL EXAMINATION: ECOG PERFORMANCE STATUS: 1 - Symptomatic but completely ambulatory  Filed Vitals:   05/17/15 1057  BP: 139/59  Pulse: 96  Temp: 98.1 F (36.7 C)  Resp: 21   Filed Weights   05/17/15 1057  Weight: 158 lb 11.7 oz (72 kg)    GENERAL: Well-nourished well-developed; Alert, no distress and comfortable.  She walks with a cane.She has chronic tremors of her head.    LABORATORY DATA:  I have reviewed the data as listed Lab Results  Component Value Date   WBC 7.0 05/06/2015   HGB 12.4 05/06/2015   HCT 37.9 05/06/2015   MCV 83.3 05/06/2015   PLT 238 05/06/2015    Recent Labs  03/08/15 1111  NA 135  K 4.2  CL 96*  CO2 27  GLUCOSE 129*  BUN 14  CREATININE 0.73  CALCIUM 8.9  GFRNONAA >60  GFRAA >60  PROT 8.2*  ALBUMIN 3.9  AST 65*  ALT 29  ALKPHOS 107  BILITOT 0.4    RADIOGRAPHIC STUDIES: I have personally reviewed the radiological images as listed and agreed with the findings in the report. No results found.  ASSESSMENT & PLAN:   # Iron deficiency anemia- unclear etiology/probable malabsorption. Responded  well to IV iron November 2016. Most recent ferritin 10 days ago was 31; hemoglobin 12.4. I would recommend a repeat IV iron infusion today which hopefully will keep her iron stores up for the next many months.   # Discussed quitting smoking- patient not  interested.  # Will plan follow-up in approximately 4 months with possible IV iron; CBC ferritin 1 week prior to the visit.  # 15 minutes face-to-face with the patient discussing the above plan of care; more than 50% of time spent on prognosis/ natural history; counseling and coordination.     Cammie Sickle, MD 05/17/2015 11:07 AM

## 2015-07-13 ENCOUNTER — Other Ambulatory Visit: Payer: Self-pay | Admitting: Internal Medicine

## 2015-07-13 DIAGNOSIS — Z1231 Encounter for screening mammogram for malignant neoplasm of breast: Secondary | ICD-10-CM

## 2015-07-15 ENCOUNTER — Ambulatory Visit
Admission: RE | Admit: 2015-07-15 | Discharge: 2015-07-15 | Disposition: A | Discharge status: Home / Self Care | Payer: Medicare Other | Source: Ambulatory Visit | Attending: Internal Medicine | Admitting: Internal Medicine

## 2015-07-15 ENCOUNTER — Other Ambulatory Visit: Payer: Self-pay | Admitting: Internal Medicine

## 2015-07-15 DIAGNOSIS — Z1231 Encounter for screening mammogram for malignant neoplasm of breast: Secondary | ICD-10-CM

## 2015-09-06 ENCOUNTER — Other Ambulatory Visit: Payer: Self-pay | Admitting: Internal Medicine

## 2015-09-06 ENCOUNTER — Inpatient Hospital Stay: Payer: Medicare Other | Attending: Internal Medicine

## 2015-09-06 DIAGNOSIS — G2581 Restless legs syndrome: Secondary | ICD-10-CM | POA: Insufficient documentation

## 2015-09-06 DIAGNOSIS — Z85850 Personal history of malignant neoplasm of thyroid: Secondary | ICD-10-CM | POA: Diagnosis not present

## 2015-09-06 DIAGNOSIS — K219 Gastro-esophageal reflux disease without esophagitis: Secondary | ICD-10-CM | POA: Diagnosis not present

## 2015-09-06 DIAGNOSIS — E785 Hyperlipidemia, unspecified: Secondary | ICD-10-CM | POA: Diagnosis not present

## 2015-09-06 DIAGNOSIS — F17210 Nicotine dependence, cigarettes, uncomplicated: Secondary | ICD-10-CM | POA: Diagnosis not present

## 2015-09-06 DIAGNOSIS — K909 Intestinal malabsorption, unspecified: Secondary | ICD-10-CM | POA: Insufficient documentation

## 2015-09-06 DIAGNOSIS — G8929 Other chronic pain: Secondary | ICD-10-CM | POA: Diagnosis not present

## 2015-09-06 DIAGNOSIS — J449 Chronic obstructive pulmonary disease, unspecified: Secondary | ICD-10-CM | POA: Insufficient documentation

## 2015-09-06 DIAGNOSIS — E1140 Type 2 diabetes mellitus with diabetic neuropathy, unspecified: Secondary | ICD-10-CM | POA: Insufficient documentation

## 2015-09-06 DIAGNOSIS — D509 Iron deficiency anemia, unspecified: Secondary | ICD-10-CM | POA: Diagnosis not present

## 2015-09-06 DIAGNOSIS — M545 Low back pain: Secondary | ICD-10-CM | POA: Diagnosis not present

## 2015-09-06 DIAGNOSIS — K5909 Other constipation: Secondary | ICD-10-CM | POA: Insufficient documentation

## 2015-09-06 DIAGNOSIS — I10 Essential (primary) hypertension: Secondary | ICD-10-CM | POA: Insufficient documentation

## 2015-09-06 DIAGNOSIS — F419 Anxiety disorder, unspecified: Secondary | ICD-10-CM | POA: Diagnosis not present

## 2015-09-06 DIAGNOSIS — Z79899 Other long term (current) drug therapy: Secondary | ICD-10-CM | POA: Diagnosis not present

## 2015-09-06 DIAGNOSIS — Z7982 Long term (current) use of aspirin: Secondary | ICD-10-CM | POA: Diagnosis not present

## 2015-09-06 DIAGNOSIS — D649 Anemia, unspecified: Secondary | ICD-10-CM

## 2015-09-06 DIAGNOSIS — Z7984 Long term (current) use of oral hypoglycemic drugs: Secondary | ICD-10-CM | POA: Diagnosis not present

## 2015-09-06 LAB — CBC WITH DIFFERENTIAL/PLATELET
BASOS ABS: 0.1 10*3/uL (ref 0–0.1)
Eosinophils Absolute: 0.5 10*3/uL (ref 0–0.7)
HCT: 39.9 % (ref 35.0–47.0)
Hemoglobin: 13.2 g/dL (ref 12.0–16.0)
Lymphs Abs: 1.4 10*3/uL (ref 1.0–3.6)
MCH: 27.8 pg (ref 26.0–34.0)
MCHC: 33 g/dL (ref 32.0–36.0)
MCV: 84.1 fL (ref 80.0–100.0)
MONO ABS: 0.5 10*3/uL (ref 0.2–0.9)
Monocytes Relative: 5 %
NEUTROS ABS: 7 10*3/uL — AB (ref 1.4–6.5)
Neutrophils Relative %: 74 %
PLATELETS: 253 10*3/uL (ref 150–440)
RBC: 4.74 MIL/uL (ref 3.80–5.20)
RDW: 15.8 % — AB (ref 11.5–14.5)
WBC: 9.4 10*3/uL (ref 3.6–11.0)

## 2015-09-06 LAB — FERRITIN: Ferritin: 25 ng/mL (ref 11–307)

## 2015-09-13 ENCOUNTER — Inpatient Hospital Stay (HOSPITAL_BASED_OUTPATIENT_CLINIC_OR_DEPARTMENT_OTHER): Payer: Medicare Other | Admitting: Internal Medicine

## 2015-09-13 ENCOUNTER — Inpatient Hospital Stay: Payer: Medicare Other

## 2015-09-13 VITALS — BP 157/64 | HR 118 | Temp 98.2°F | Resp 18 | Wt 156.5 lb

## 2015-09-13 VITALS — BP 129/71 | HR 92 | Temp 96.7°F | Resp 16

## 2015-09-13 DIAGNOSIS — Z79899 Other long term (current) drug therapy: Secondary | ICD-10-CM

## 2015-09-13 DIAGNOSIS — Z85850 Personal history of malignant neoplasm of thyroid: Secondary | ICD-10-CM | POA: Diagnosis not present

## 2015-09-13 DIAGNOSIS — F17210 Nicotine dependence, cigarettes, uncomplicated: Secondary | ICD-10-CM | POA: Diagnosis not present

## 2015-09-13 DIAGNOSIS — K909 Intestinal malabsorption, unspecified: Secondary | ICD-10-CM

## 2015-09-13 DIAGNOSIS — D508 Other iron deficiency anemias: Secondary | ICD-10-CM

## 2015-09-13 DIAGNOSIS — C73 Malignant neoplasm of thyroid gland: Secondary | ICD-10-CM

## 2015-09-13 DIAGNOSIS — D509 Iron deficiency anemia, unspecified: Secondary | ICD-10-CM | POA: Diagnosis not present

## 2015-09-13 DIAGNOSIS — D649 Anemia, unspecified: Secondary | ICD-10-CM

## 2015-09-13 MED ORDER — SODIUM CHLORIDE 0.9 % IV SOLN
510.0000 mg | Freq: Once | INTRAVENOUS | Status: AC
  Administered 2015-09-13: 510 mg via INTRAVENOUS
  Filled 2015-09-13: qty 17

## 2015-09-13 MED ORDER — SODIUM CHLORIDE 0.9 % IV SOLN
Freq: Once | INTRAVENOUS | Status: AC
  Administered 2015-09-13: 11:00:00 via INTRAVENOUS
  Filled 2015-09-13: qty 1000

## 2015-09-13 NOTE — Progress Notes (Signed)
[facility] CONSULT NOTE  Patient Care Team: Tracie Harrier, MD as PCP - General (Internal Medicine)  CHIEF COMPLAINTS/PURPOSE OF CONSULTATION: Anemia   # AUG 2015- IRON DEFICIENCY ANEMIA- [Oct 2016 hb~8.7;MCV-76 Ferritin- 13] s/p colo [2015] & EGD; April 2016- CT A/P- NEG; NOV 2016- Hb- 8.5/15-Ferritin-Ferrahem x2;  # Thyroid ca s/p S & RAIU [June 2009]  # Chronic tremors; Active smoker  HISTORY OF PRESENTING ILLNESS:  Martha [name] 70 y.o. pleasant female 70 year old with iron deficiency anemia of unclear etiology [question malabsorption] is here for follow-up.  Patient's last IV iron was approximately 4 months ago. Patient felt improved energy after her IV iron. However again the last few weeks she noted to have worsening fatigue. She is chronic shortness of breath/underlying COPD.  Denies any blood in stools. Patient takes by mouth iron/constipated on stool softener.  She is having better energy; she is able to walk better/study with a cane.   ROS: A complete 10 point review of system is done which is negative except mentioned above in history of present illness  MEDICAL HISTORY:  Past Medical History  Diagnosis Date  . Type 2 diabetes mellitus ([place])   . Iron deficiency anemia   . Abnormal LFTs (liver function tests)   . Fatty liver   . Plantar fasciitis   . Diabetic neuropathy ([place])   . Chronic low back pain   . Essential hypertension   . Thyroid cancer ([place]) 2009  . Tremors of nervous system     "I think I have parkinson's disease"  . Anxiety   . Insomnia   . COPD (chronic obstructive pulmonary disease) ([place])   . Cigarette smoker     Has cut back Smoking to 1 pack every other day  . Low serum vitamin D   . Hyperlipidemia   . Adenomatous polyp of colon 2009  . Chronic bronchitis ([place])   . Chronic constipation   . GERD (gastroesophageal reflux disease)   . RLS (restless legs syndrome)   . Diverticulitis 2015    gi recommended repeat scope in 10  years  . History of mammogram 05/28/2013    SURGICAL HISTORY: Past Surgical History  Procedure Laterality Date  . Thyroidectomy  2009    states she was tx with radiactive iodine  . Colonoscopy  2004, 2009, 2015    Adenoma  . Tubal ligation    . Polypectomy  2009  . Upper gastrointestinal endoscopy      SOCIAL HISTORY: Social History   Social History  . Marital Status: Divorced    Spouse Name: N/A  . Number of Children: N/A  . Years of Education: N/A   Occupational History  . Not on file.   Social History Main Topics  . Smoking status: Current Every Day Smoker -- 0.50 packs/day for 30 years    Types: Cigarettes  . Smokeless tobacco: Never Used     Comment: curently smokes 12 cigerattes a day. I'm trying to cut back.  . Alcohol Use: No  . Drug Use: No  . Sexual Activity: No   Other Topics Concern  . Not on file   Social History Narrative    FAMILY HISTORY: Family History  Problem Relation Age of Onset  . Diabetes Mellitus II Mother   . Arthritis Mother   . Diabetes Mellitus II Father   . Lung disease Father   . Thyroid disease    . Asthma Sister   . Emphysema Sister   . Hypertension Sister  ALLERGIES:  is allergic to codeine sulfate; nicotine polacrilex; nicotrol; and metformin.  MEDICATIONS:  Current Outpatient Prescriptions  Medication Sig Dispense Refill  . Alcohol Swabs (ALCOHOL PREP) 70 % PADS daily.  99  . ALPRAZolam (XANAX) 0.25 MG tablet Take 1 tablet by mouth at bedtime as needed. Sleep/insomnia    . aspirin EC 81 MG tablet Take 1 tablet by mouth daily.    . cetirizine (ZYRTEC) 10 MG tablet Take 1 tablet by mouth daily.    [name] Calcium (STOOL SOFTENER PO) Take 1 capsule by mouth daily.    Marland Kitchen EASY COMFORT LANCETS MISC daily. for testing  99  . gabapentin (NEURONTIN) 400 MG capsule Take 1 capsule by mouth 3 (three) times daily.    Marland Kitchen glipiZIDE (GLUCOTROL) 10 MG tablet Take 1 tablet by mouth 2 (two) times daily.    Marland Kitchen glucose blood test  strip     . hydrochlorothiazide (HYDRODIURIL) 25 MG tablet Take 1 tablet by mouth daily.    Marland Kitchen levocetirizine (XYZAL) 5 MG tablet Take 1 tablet by mouth daily. Reported on 05/17/2015    . levothyroxine (SYNTHROID, LEVOTHROID) 150 MCG tablet Take 1 tablet by mouth daily.    . metFORMIN (GLUCOPHAGE) 1000 MG tablet Take 1 tablet by mouth 2 (two) times daily.    . ONE TOUCH ULTRA TEST test strip daily. for testing  99  . PROAIR HFA 108 (90 BASE) MCG/ACT inhaler Inhale 2 Inhalers into the lungs every 6 (six) hours as needed. Shortness of breath    . Probiotic Product (ALIGN) 4 MG CAPS Take 1 capsule by mouth daily.    . valsartan (DIOVAN) 160 MG tablet Take 1 tablet by mouth daily.    . vitamin C (ASCORBIC ACID) 500 MG tablet Take 500 mg by mouth daily.    . Vitamin D, Ergocalciferol, (DRISDOL) 50000 UNITS CAPS capsule Take 1 capsule by mouth once a week.    . ferrous sulfate 325 (65 FE) MG EC tablet Take 1 tablet by mouth daily.     No current facility-administered medications for this visit.      Marland Kitchen  PHYSICAL EXAMINATION:   Filed Vitals:   09/13/15 1027  BP: 157/64  Pulse: 118  Temp: 98.2 F (36.8 C)  Resp: 18   Filed Weights   09/13/15 1027  Weight: 156 lb 8.4 oz (71 kg)    GENERAL: Well-nourished well-developed; Alert, no distress and comfortable. She walks with a cane.She has chronic tremors of her head.  EYES: no pallor or icterus OROPHARYNX: no thrush or ulceration; poor dentition NECK: supple, no masses felt LYMPH: no palpable lymphadenopathy in the cervical, axillary or inguinal regions LUNGS: Decreased breath sounds bilaterally No wheeze or crackles HEART/CVS: regular rate & rhythm and no murmurs; No lower extremity edema ABDOMEN: abdomen soft, non-tender and normal bowel sounds Musculoskeletal:no cyanosis of digits and no clubbing  PSYCH: alert & oriented x 3 with fluent speech NEURO: no focal motor/sensory deficits SKIN: no rashes or significant  lesions   LABORATORY DATA:  I have reviewed the data as listed Lab Results  Component Value Date   WBC 9.4 09/06/2015   HGB 13.2 09/06/2015   HCT 39.9 09/06/2015   MCV 84.1 09/06/2015   PLT 253 09/06/2015    Recent Labs  03/08/15 1111  NA 135  K 4.2  CL 96*  CO2 27  GLUCOSE 129*  BUN 14  CREATININE 0.73  CALCIUM 8.9  GFRNONAA >60  GFRAA >60  PROT 8.2*  ALBUMIN  3.9  AST 65*  ALT 29  ALKPHOS 107  BILITOT 0.4     ASSESSMENT & PLAN:   # Iron deficiency anemia- unclear etiology/probable malabsorption. Responds well to IV iron. Most recent IV iron approximately 4 months ago/January 2017- today hemoglobin is 13 however ferritin is 25/trending down. Patient is symptomatic with worsening fatigue. Would recommend IV iron/ferritin today.  # Thyroid cancer-status post RAIU clinically no recurrence of recurrence..Check antithyroglobulin antibodies/thyroid profile next visit.  #  Discussed quitting smoking- patient not interested.  # Smoker- discussed lung cancer screening program. Patient interested. We'll refer to [employer].   # follow-up in 6 months/labs week prior/M.D. Visit/IV ferrahem possible.     Cammie Sickle, MD 09/13/2015 10:29 AM

## 2015-09-22 ENCOUNTER — Telehealth: Payer: Self-pay | Admitting: *Deleted

## 2015-09-22 NOTE — Telephone Encounter (Signed)
Received referral for initial lung cancer screening scan. Contacted patient and obtained smoking history as well as answering questions related to screening process. Patient is tentatively scheduled for shared decision making visit and CT scan on 09/30/15 at 2pm, pending insurance approval from business office.

## 2015-09-29 ENCOUNTER — Encounter: Payer: Self-pay | Admitting: Family Medicine

## 2015-09-29 ENCOUNTER — Other Ambulatory Visit: Payer: Self-pay | Admitting: Family Medicine

## 2015-09-29 DIAGNOSIS — Z87891 Personal history of nicotine dependence: Secondary | ICD-10-CM

## 2015-09-29 HISTORY — DX: Personal history of nicotine dependence: Z87.891

## 2015-09-30 ENCOUNTER — Encounter: Payer: Self-pay | Admitting: Family Medicine

## 2015-09-30 ENCOUNTER — Inpatient Hospital Stay: Payer: Medicare Other | Attending: Family Medicine | Admitting: Family Medicine

## 2015-09-30 ENCOUNTER — Ambulatory Visit
Admission: RE | Admit: 2015-09-30 | Discharge: 2015-09-30 | Disposition: A | Discharge status: Home / Self Care | Payer: Medicare Other | Source: Ambulatory Visit | Attending: Family Medicine | Admitting: Family Medicine

## 2015-09-30 DIAGNOSIS — Z87891 Personal history of nicotine dependence: Secondary | ICD-10-CM | POA: Diagnosis present

## 2015-09-30 DIAGNOSIS — R918 Other nonspecific abnormal finding of lung field: Secondary | ICD-10-CM | POA: Insufficient documentation

## 2015-09-30 DIAGNOSIS — Z122 Encounter for screening for malignant neoplasm of respiratory organs: Secondary | ICD-10-CM

## 2015-09-30 NOTE — Progress Notes (Signed)
In accordance with CMS guidelines, patient has meet eligibility criteria including age, absence of signs or symptoms of lung cancer, the specific calculation of cigarette smoking pack-years was 30 years and is a current smoker.   A shared decision-making session was conducted prior to the performance of CT scan. This includes one or more decision aids, includes benefits and harms of screening, follow-up diagnostic testing, over-diagnosis, false positive rate, and total radiation exposure.  Counseling on the importance of adherence to annual lung cancer LDCT screening, impact of co-morbidities, and ability or willingness to undergo diagnosis and treatment is imperative for compliance of the program.  Counseling on the importance of continued smoking cessation for former smokers; the importance of smoking cessation for current smokers and information about tobacco cessation interventions have been given to patient including the [place] at [facility], 1800 quit [place], as well as Cancer Center specific smoking cessation programs.  Written order for lung cancer screening with LDCT has been given to the patient and any and all questions have been answered to the best of my abilities.   Yearly follow up will be scheduled by Shawn Perkins, Thoracic Navigator.   

## 2015-10-03 ENCOUNTER — Telehealth: Payer: Self-pay | Admitting: *Deleted

## 2015-10-03 NOTE — Telephone Encounter (Signed)
Notified patient of LDCT lung cancer screening results with recommendation for 12 month follow up imaging. Also notified of incidental finding noted below. Patient verbalizes understanding.  Results to be forwarded to PCP and referring provider.  IMPRESSION: No suspicious pulmonary nodules. Lung-RADS Category 1, negative. Continue annual screening with low-dose chest CT without contrast in 12 months. Pack  Moderate emphysematous changes, with bullous changes in the medial left lung apex.

## 2015-11-17 ENCOUNTER — Encounter: Payer: Self-pay | Admitting: *Deleted

## 2015-11-24 ENCOUNTER — Ambulatory Visit
Admission: RE | Admit: 2015-11-24 | Discharge: 2015-11-24 | Disposition: A | Discharge status: Home / Self Care | Payer: Medicare Other | Source: Ambulatory Visit | Attending: Ophthalmology | Admitting: Ophthalmology

## 2015-11-24 ENCOUNTER — Encounter
Admission: RE | Disposition: A | Discharge status: Home / Self Care | Payer: Self-pay | Source: Ambulatory Visit | Attending: Ophthalmology

## 2015-11-24 ENCOUNTER — Ambulatory Visit: Payer: Medicare Other | Admitting: Anesthesiology

## 2015-11-24 DIAGNOSIS — Z885 Allergy status to narcotic agent status: Secondary | ICD-10-CM | POA: Diagnosis not present

## 2015-11-24 DIAGNOSIS — Z7982 Long term (current) use of aspirin: Secondary | ICD-10-CM | POA: Insufficient documentation

## 2015-11-24 DIAGNOSIS — Z9889 Other specified postprocedural states: Secondary | ICD-10-CM | POA: Diagnosis not present

## 2015-11-24 DIAGNOSIS — K219 Gastro-esophageal reflux disease without esophagitis: Secondary | ICD-10-CM | POA: Diagnosis not present

## 2015-11-24 DIAGNOSIS — Z888 Allergy status to other drugs, medicaments and biological substances status: Secondary | ICD-10-CM | POA: Diagnosis not present

## 2015-11-24 DIAGNOSIS — I10 Essential (primary) hypertension: Secondary | ICD-10-CM | POA: Diagnosis not present

## 2015-11-24 DIAGNOSIS — Z7984 Long term (current) use of oral hypoglycemic drugs: Secondary | ICD-10-CM | POA: Insufficient documentation

## 2015-11-24 DIAGNOSIS — Z87891 Personal history of nicotine dependence: Secondary | ICD-10-CM | POA: Insufficient documentation

## 2015-11-24 DIAGNOSIS — M1990 Unspecified osteoarthritis, unspecified site: Secondary | ICD-10-CM | POA: Diagnosis not present

## 2015-11-24 DIAGNOSIS — Z9851 Tubal ligation status: Secondary | ICD-10-CM | POA: Diagnosis not present

## 2015-11-24 DIAGNOSIS — Z79899 Other long term (current) drug therapy: Secondary | ICD-10-CM | POA: Insufficient documentation

## 2015-11-24 DIAGNOSIS — J45909 Unspecified asthma, uncomplicated: Secondary | ICD-10-CM | POA: Diagnosis not present

## 2015-11-24 DIAGNOSIS — F329 Major depressive disorder, single episode, unspecified: Secondary | ICD-10-CM | POA: Diagnosis not present

## 2015-11-24 DIAGNOSIS — E119 Type 2 diabetes mellitus without complications: Secondary | ICD-10-CM | POA: Insufficient documentation

## 2015-11-24 DIAGNOSIS — M109 Gout, unspecified: Secondary | ICD-10-CM | POA: Insufficient documentation

## 2015-11-24 DIAGNOSIS — H2512 Age-related nuclear cataract, left eye: Secondary | ICD-10-CM | POA: Diagnosis not present

## 2015-11-24 DIAGNOSIS — H269 Unspecified cataract: Secondary | ICD-10-CM | POA: Diagnosis present

## 2015-11-24 DIAGNOSIS — E079 Disorder of thyroid, unspecified: Secondary | ICD-10-CM | POA: Insufficient documentation

## 2015-11-24 HISTORY — DX: Gout, unspecified: M10.9

## 2015-11-24 HISTORY — DX: Depression, unspecified: F32.A

## 2015-11-24 HISTORY — DX: Hypothyroidism, unspecified: E03.9

## 2015-11-24 HISTORY — DX: Major depressive disorder, single episode, unspecified: F32.9

## 2015-11-24 HISTORY — DX: Unspecified asthma, uncomplicated: J45.909

## 2015-11-24 HISTORY — PX: CATARACT EXTRACTION W/PHACO: SHX586

## 2015-11-24 HISTORY — DX: Reserved for inherently not codable concepts without codable children: IMO0001

## 2015-11-24 HISTORY — DX: Unspecified osteoarthritis, unspecified site: M19.90

## 2015-11-24 LAB — GLUCOSE, CAPILLARY: GLUCOSE-CAPILLARY: 173 mg/dL — AB (ref 65–99)

## 2015-11-24 SURGERY — PHACOEMULSIFICATION, CATARACT, WITH IOL INSERTION
Anesthesia: Monitor Anesthesia Care | Site: Eye | Laterality: Left | Wound class: Clean

## 2015-11-24 MED ORDER — FENTANYL CITRATE (PF) 100 MCG/2ML IJ SOLN
INTRAMUSCULAR | Status: DC | PRN
  Administered 2015-11-24: 50 ug via INTRAVENOUS

## 2015-11-24 MED ORDER — ARMC OPHTHALMIC DILATING GEL
OPHTHALMIC | Status: AC
  Administered 2015-11-24: 1 via OPHTHALMIC
  Filled 2015-11-24: qty 0.25

## 2015-11-24 MED ORDER — TETRACAINE HCL 0.5 % OP SOLN
1.0000 [drp] | Freq: Once | OPHTHALMIC | Status: AC
  Administered 2015-11-24: 1 [drp] via OPHTHALMIC

## 2015-11-24 MED ORDER — MOXIFLOXACIN HCL 0.5 % OP SOLN
OPHTHALMIC | Status: DC | PRN
  Administered 2015-11-24: 1 [drp] via OPHTHALMIC

## 2015-11-24 MED ORDER — MOXIFLOXACIN HCL 0.5 % OP SOLN: 1.0000 [drp] | OPHTHALMIC | Status: AC | PRN

## 2015-11-24 MED ORDER — MOXIFLOXACIN HCL 0.5 % OP SOLN
OPHTHALMIC | Status: AC
  Filled 2015-11-24: qty 3

## 2015-11-24 MED ORDER — EPINEPHRINE HCL 1 MG/ML IJ SOLN
INTRAMUSCULAR | Status: AC
  Filled 2015-11-24: qty 1

## 2015-11-24 MED ORDER — NA CHONDROIT SULF-NA HYALURON 40-17 MG/ML IO SOLN
INTRAOCULAR | Status: AC
  Filled 2015-11-24: qty 1

## 2015-11-24 MED ORDER — CEFUROXIME OPHTHALMIC INJECTION 1 MG/0.1 ML
INJECTION | OPHTHALMIC | Status: DC | PRN
Start: 2015-11-24 — End: 2015-11-24
  Administered 2015-11-24: 0.1 mL via INTRACAMERAL

## 2015-11-24 MED ORDER — POVIDONE-IODINE 5 % OP SOLN
1.0000 "application " | Freq: Once | OPHTHALMIC | Status: AC
  Administered 2015-11-24: 1 via OPHTHALMIC

## 2015-11-24 MED ORDER — POVIDONE-IODINE 5 % OP SOLN
OPHTHALMIC | Status: AC
  Administered 2015-11-24: 1 via OPHTHALMIC
  Filled 2015-11-24: qty 30

## 2015-11-24 MED ORDER — CARBACHOL 0.01 % IO SOLN
INTRAOCULAR | Status: DC | PRN
  Administered 2015-11-24: 0.5 mL via INTRAOCULAR

## 2015-11-24 MED ORDER — CEFUROXIME OPHTHALMIC INJECTION 1 MG/0.1 ML
INJECTION | OPHTHALMIC | Status: AC
  Filled 2015-11-24: qty 0.1

## 2015-11-24 MED ORDER — NA CHONDROIT SULF-NA HYALURON 40-17 MG/ML IO SOLN
INTRAOCULAR | Status: DC | PRN
  Administered 2015-11-24: 1 mL via INTRAOCULAR

## 2015-11-24 MED ORDER — ARMC OPHTHALMIC DILATING GEL
1.0000 "application " | OPHTHALMIC | Status: AC | PRN
  Administered 2015-11-24 (×2): 1 via OPHTHALMIC

## 2015-11-24 MED ORDER — EPINEPHRINE HCL 1 MG/ML IJ SOLN
INTRAOCULAR | Status: DC | PRN
  Administered 2015-11-24: 08:00:00 via OPHTHALMIC

## 2015-11-24 MED ORDER — SODIUM CHLORIDE 0.9 % IV SOLN
INTRAVENOUS | Status: DC
  Administered 2015-11-24: 07:00:00 via INTRAVENOUS

## 2015-11-24 MED ORDER — TETRACAINE HCL 0.5 % OP SOLN
OPHTHALMIC | Status: AC
  Administered 2015-11-24: 1 [drp] via OPHTHALMIC
  Filled 2015-11-24: qty 2

## 2015-11-24 MED ORDER — MIDAZOLAM HCL 2 MG/2ML IJ SOLN
INTRAMUSCULAR | Status: DC | PRN
  Administered 2015-11-24: 1 mg via INTRAVENOUS

## 2015-11-24 SURGICAL SUPPLY — 21 items
CANNULA ANT/CHMB 27GA (MISCELLANEOUS) ×3 IMPLANT
CUP MEDICINE 2OZ PLAST GRAD ST (MISCELLANEOUS) ×3 IMPLANT
GLOVE BIO SURGEON STRL SZ8 (GLOVE) ×3 IMPLANT
GLOVE BIOGEL M 6.5 STRL (GLOVE) ×3 IMPLANT
GLOVE SURG LX 8.0 MICRO (GLOVE) ×2
GLOVE SURG LX STRL 8.0 MICRO (GLOVE) ×1 IMPLANT
GOWN STRL REUS W/ TWL LRG LVL3 (GOWN DISPOSABLE) ×2 IMPLANT
GOWN STRL REUS W/TWL LRG LVL3 (GOWN DISPOSABLE) ×4
LENS IOL TECNIS ITEC 21.0 (Intraocular Lens) ×3 IMPLANT
PACK CATARACT (MISCELLANEOUS) ×3 IMPLANT
PACK CATARACT BRASINGTON LX (MISCELLANEOUS) ×3 IMPLANT
PACK EYE AFTER SURG (MISCELLANEOUS) ×3 IMPLANT
SOL BSS BAG (MISCELLANEOUS) ×3
SOL PREP PVP 2OZ (MISCELLANEOUS) ×3
SOLUTION BSS BAG (MISCELLANEOUS) ×1 IMPLANT
SOLUTION PREP PVP 2OZ (MISCELLANEOUS) ×1 IMPLANT
SYR 3ML LL SCALE MARK (SYRINGE) ×3 IMPLANT
SYR 5ML LL (SYRINGE) ×3 IMPLANT
SYR TB 1ML 27GX1/2 LL (SYRINGE) ×3 IMPLANT
WATER STERILE IRR 1000ML POUR (IV SOLUTION) ×3 IMPLANT
WIPE NON LINTING 3.25X3.25 (MISCELLANEOUS) ×3 IMPLANT

## 2015-11-24 NOTE — Op Note (Signed)
PREOPERATIVE DIAGNOSIS:  Nuclear sclerotic cataract of the left eye.   POSTOPERATIVE DIAGNOSIS:  NUCLEAR SCLEROTIC CATARACT LEFT EYE   OPERATIVE PROCEDURE:  Procedure(s): CATARACT EXTRACTION PHACO AND INTRAOCULAR LENS PLACEMENT (IOC)   SURGEON:  Birder Robson, MD.   ANESTHESIA:   Anesthesiologist: Gijsbertus Lonia Mad, MD CRNA: Jonna Clark, CRNA  1.      Managed anesthesia care. 2.      Topical tetracaine drops followed by 2% Xylocaine jelly applied in the preoperative holding area.   COMPLICATIONS:  None.   TECHNIQUE:   Stop and chop   DESCRIPTION OF PROCEDURE:  The patient was examined and consented in the preoperative holding area where the aforementioned topical anesthesia was applied to the left eye and then brought back to the Operating Room where the left eye was prepped and draped in the usual sterile ophthalmic fashion and a lid speculum was placed. A paracentesis was created with the side port blade and the anterior chamber was filled with viscoelastic. A near clear corneal incision was performed with the steel keratome. A continuous curvilinear capsulorrhexis was performed with a cystotome followed by the capsulorrhexis forceps. Hydrodissection and hydrodelineation were carried out with BSS on a blunt cannula. The lens was removed in a stop and chop  technique and the remaining cortical material was removed with the irrigation-aspiration handpiece. The capsular bag was inflated with viscoelastic and the Technis [id number] lens was placed in the capsular bag without complication. The remaining viscoelastic was removed from the eye with the irrigation-aspiration handpiece. The wounds were hydrated. The anterior chamber was flushed with Miostat and the eye was inflated to physiologic pressure. 0.1 mL of cefuroxime concentration 10 mg/mL was placed in the anterior chamber. The wounds were found to be water tight. The eye was dressed with Vigamox. The patient was given protective  glasses to wear throughout the day and a shield with which to sleep tonight. The patient was also given drops with which to begin a drop regimen today and will follow-up with me in one day.  Implant Name Type Inv. Item Serial No. Manufacturer Lot No. LRB No. Used  Tecnis Aspheric IOL Intraocular Lens   [id number] [employer]   Left 1   Procedure(s) with comments: CATARACT EXTRACTION PHACO AND INTRAOCULAR LENS PLACEMENT (IOC) (Left) - [place] 38.3 AP% 20.6 CDE 7.89 FLUID PACK LOT # [id number]  Electronically signed: [place] 11/24/2015 8:08 AM

## 2015-11-24 NOTE — H&P (Signed)
  All labs reviewed. Abnormal studies sent to patients PCP when indicated.  Previous H&P reviewed, patient examined, there are NO CHANGES.  Martha Bellanger LOUIS7/27/20177:45 AM

## 2015-11-24 NOTE — Transfer of Care (Signed)
Immediate Anesthesia Transfer of Care Note  Patient: Parthenia Ames  Procedure(s) Performed: Procedure(s) with comments: CATARACT EXTRACTION PHACO AND INTRAOCULAR LENS PLACEMENT (IOC) (Left) - [place] 38.3 AP% 20.6 CDE 7.89 FLUID PACK LOT # [id number]  Patient Location: PACU and Short Stay  Anesthesia Type:MAC  Level of Consciousness: awake, alert  and oriented  Airway & Oxygen Therapy: Patient Spontanous Breathing  Post-op Assessment: Report given to RN and Post -op Vital signs reviewed and stable  Post vital signs: Reviewed and stable  Last Vitals:  Vitals:   11/24/15 0637 11/24/15 0810  BP: (!) 169/65 (!) 152/59  Pulse: 94   Resp: 16 18  Temp: 37.1 C 36.1 C    Last Pain:  Vitals:   11/24/15 0810  TempSrc: Tympanic         Complications: No apparent anesthesia complications

## 2015-11-24 NOTE — Anesthesia Preprocedure Evaluation (Signed)
Anesthesia Evaluation  Patient identified by MRN, date of birth, ID band Patient awake    Reviewed: Allergy & Precautions, NPO status , Patient's Chart, lab work & pertinent test results  Airway Mallampati: II       Dental  (+) Edentulous Upper, Edentulous Lower   Pulmonary COPD,  COPD inhaler, former smoker,    breath sounds clear to auscultation+ rhonchi        Cardiovascular Exercise Tolerance: Poor hypertension, Pt. on medications  Rhythm:Regular     Neuro/Psych Anxiety Depression    GI/Hepatic Neg liver ROS, GERD  Medicated,  Endo/Other  diabetes, Oral Hypoglycemic AgentsHypothyroidism   Renal/GU negative Renal ROS     Musculoskeletal   Abdominal Normal abdominal exam  (+)   Peds  Hematology  (+) anemia ,   Anesthesia Other Findings   Reproductive/Obstetrics                             Anesthesia Physical Anesthesia Plan  ASA: III  Anesthesia Plan: MAC   Post-op Pain Management:    Induction: Intravenous  Airway Management Planned: Natural Airway and Nasal Cannula  Additional Equipment:   Intra-op Plan:   Post-operative Plan:   Informed Consent: I have reviewed the patients History and Physical, chart, labs and discussed the procedure including the risks, benefits and alternatives for the proposed anesthesia with the patient or authorized representative who has indicated his/her understanding and acceptance.     Plan Discussed with: CRNA  Anesthesia Plan Comments:         Anesthesia Quick Evaluation

## 2015-11-24 NOTE — Anesthesia Postprocedure Evaluation (Signed)
Anesthesia Post Note  Patient: Martha [name]  Procedure(s) Performed: Procedure(s) (LRB): CATARACT EXTRACTION PHACO AND INTRAOCULAR LENS PLACEMENT (IOC) (Left)  Patient location during evaluation: Short Stay Anesthesia Type: MAC Level of consciousness: awake and alert and oriented Pain management: pain level controlled Vital Signs Assessment: post-procedure vital signs reviewed and stable Respiratory status: spontaneous breathing and nonlabored ventilation Cardiovascular status: stable Postop Assessment: no signs of nausea or vomiting Anesthetic complications: no    Last Vitals:  Vitals:   11/24/15 0810 11/24/15 0822  BP: (!) 152/59 (!) 157/76  Pulse:    Resp: 18   Temp: 36.1 C     Last Pain:  Vitals:   11/24/15 0810  TempSrc: Tympanic                 Lanora Manis

## 2015-11-24 NOTE — Discharge Instructions (Signed)
Eye Surgery Discharge Instructions  Expect mild scratchy sensation or mild soreness. DO NOT RUB YOUR EYE!  The day of surgery:  Minimal physical activity, but bed rest is not required  No reading, computer work, or close hand work  No bending, lifting, or straining.  May watch TV  For 24 hours:  No driving, legal decisions, or alcoholic beverages  Safety precautions  Eat anything you prefer: It is better to start with liquids, then soup then solid foods.  _____ Eye patch should be worn until postoperative exam tomorrow.  ____ Solar shield eyeglasses should be worn for comfort in the sunlight/patch while sleeping  Resume all regular medications including aspirin or Coumadin if these were discontinued prior to surgery. You may shower, bathe, shave, or wash your hair. Tylenol may be taken for mild discomfort.  Call your doctor if you experience significant pain, nausea, or vomiting, fever > 101 or other signs of infection. [phone] or [phone] Specific instructions:  Follow-up Information    Tim Lair, MD .   Specialty:  Ophthalmology Why:  11-25-15 at 9:45 Contact information: [address] [place] [address] [phone]          Eye Surgery Discharge Instructions  Expect mild scratchy sensation or mild soreness. DO NOT RUB YOUR EYE!  The day of surgery:  Minimal physical activity, but bed rest is not required  No reading, computer work, or close hand work  No bending, lifting, or straining.  May watch TV  For 24 hours:  No driving, legal decisions, or alcoholic beverages  Safety precautions  Eat anything you prefer: It is better to start with liquids, then soup then solid foods.  _____ Eye patch should be worn until postoperative exam tomorrow.  ____ Solar shield eyeglasses should be worn for comfort in the sunlight/patch while sleeping  Resume all regular medications including aspirin or Coumadin if these were  discontinued prior to surgery. You may shower, bathe, shave, or wash your hair. Tylenol may be taken for mild discomfort.  Call your doctor if you experience significant pain, nausea, or vomiting, fever > 101 or other signs of infection. [phone] or [phone] Specific instructions:  Follow-up Information    Tim Lair, MD .   Specialty:  Ophthalmology Why:  11-25-15 at 9:45 Contact information: [address] [place]  [id number] [phone]

## 2016-02-09 ENCOUNTER — Encounter: Payer: Self-pay | Admitting: Ophthalmology

## 2016-03-13 ENCOUNTER — Inpatient Hospital Stay: Payer: Medicare Other | Attending: Internal Medicine

## 2016-03-13 DIAGNOSIS — E039 Hypothyroidism, unspecified: Secondary | ICD-10-CM | POA: Insufficient documentation

## 2016-03-13 DIAGNOSIS — I10 Essential (primary) hypertension: Secondary | ICD-10-CM | POA: Insufficient documentation

## 2016-03-13 DIAGNOSIS — Z7984 Long term (current) use of oral hypoglycemic drugs: Secondary | ICD-10-CM | POA: Diagnosis not present

## 2016-03-13 DIAGNOSIS — C73 Malignant neoplasm of thyroid gland: Secondary | ICD-10-CM

## 2016-03-13 DIAGNOSIS — F419 Anxiety disorder, unspecified: Secondary | ICD-10-CM | POA: Diagnosis not present

## 2016-03-13 DIAGNOSIS — K5909 Other constipation: Secondary | ICD-10-CM | POA: Insufficient documentation

## 2016-03-13 DIAGNOSIS — D649 Anemia, unspecified: Secondary | ICD-10-CM

## 2016-03-13 DIAGNOSIS — D500 Iron deficiency anemia secondary to blood loss (chronic): Secondary | ICD-10-CM | POA: Diagnosis present

## 2016-03-13 DIAGNOSIS — E785 Hyperlipidemia, unspecified: Secondary | ICD-10-CM | POA: Diagnosis not present

## 2016-03-13 DIAGNOSIS — K219 Gastro-esophageal reflux disease without esophagitis: Secondary | ICD-10-CM | POA: Diagnosis not present

## 2016-03-13 DIAGNOSIS — F329 Major depressive disorder, single episode, unspecified: Secondary | ICD-10-CM | POA: Diagnosis not present

## 2016-03-13 DIAGNOSIS — M109 Gout, unspecified: Secondary | ICD-10-CM | POA: Insufficient documentation

## 2016-03-13 DIAGNOSIS — J449 Chronic obstructive pulmonary disease, unspecified: Secondary | ICD-10-CM | POA: Diagnosis not present

## 2016-03-13 DIAGNOSIS — E1140 Type 2 diabetes mellitus with diabetic neuropathy, unspecified: Secondary | ICD-10-CM | POA: Insufficient documentation

## 2016-03-13 DIAGNOSIS — Z79899 Other long term (current) drug therapy: Secondary | ICD-10-CM | POA: Diagnosis not present

## 2016-03-13 DIAGNOSIS — Z7982 Long term (current) use of aspirin: Secondary | ICD-10-CM | POA: Diagnosis not present

## 2016-03-13 DIAGNOSIS — G4700 Insomnia, unspecified: Secondary | ICD-10-CM | POA: Insufficient documentation

## 2016-03-13 DIAGNOSIS — Z85850 Personal history of malignant neoplasm of thyroid: Secondary | ICD-10-CM | POA: Insufficient documentation

## 2016-03-13 DIAGNOSIS — G2581 Restless legs syndrome: Secondary | ICD-10-CM | POA: Diagnosis not present

## 2016-03-13 DIAGNOSIS — Z87891 Personal history of nicotine dependence: Secondary | ICD-10-CM | POA: Insufficient documentation

## 2016-03-13 LAB — CBC WITH DIFFERENTIAL/PLATELET
BASOS PCT: 1 %
Basophils Absolute: 0.2 10*3/uL — ABNORMAL HIGH (ref 0–0.1)
EOS ABS: 0.3 10*3/uL (ref 0–0.7)
Eosinophils Relative: 2 %
HEMATOCRIT: 39.9 % (ref 35.0–47.0)
HEMOGLOBIN: 12.9 g/dL (ref 12.0–16.0)
LYMPHS ABS: 1.8 10*3/uL (ref 1.0–3.6)
Lymphocytes Relative: 14 %
MCH: 27.3 pg (ref 26.0–34.0)
MCHC: 32.3 g/dL (ref 32.0–36.0)
MCV: 84.5 fL (ref 80.0–100.0)
MONO ABS: 0.5 10*3/uL (ref 0.2–0.9)
MONOS PCT: 4 %
NEUTROS ABS: 10.3 10*3/uL — AB (ref 1.4–6.5)
Neutrophils Relative %: 79 %
Platelets: 204 10*3/uL (ref 150–440)
RBC: 4.72 MIL/uL (ref 3.80–5.20)
RDW: 16.3 % — AB (ref 11.5–14.5)
WBC: 13.1 10*3/uL — ABNORMAL HIGH (ref 3.6–11.0)

## 2016-03-13 LAB — COMPREHENSIVE METABOLIC PANEL
ALBUMIN: 4.1 g/dL (ref 3.5–5.0)
ALK PHOS: 91 U/L (ref 38–126)
ALT: 24 U/L (ref 14–54)
ANION GAP: 12 (ref 5–15)
AST: 29 U/L (ref 15–41)
BILIRUBIN TOTAL: 0.5 mg/dL (ref 0.3–1.2)
BUN: 18 mg/dL (ref 6–20)
CALCIUM: 9.8 mg/dL (ref 8.9–10.3)
CO2: 27 mmol/L (ref 22–32)
Chloride: 92 mmol/L — ABNORMAL LOW (ref 101–111)
Creatinine, Ser: 0.77 mg/dL (ref 0.44–1.00)
GFR calc non Af Amer: >60 mL/min (ref >60)
GLUCOSE: 256 mg/dL — AB (ref 65–99)
POTASSIUM: 3.9 mmol/L (ref 3.5–5.1)
Sodium: 131 mmol/L — ABNORMAL LOW (ref 135–145)
TOTAL PROTEIN: 7.8 g/dL (ref 6.5–8.1)

## 2016-03-13 LAB — FERRITIN: FERRITIN: 14 ng/mL (ref 11–307)

## 2016-03-13 LAB — IRON AND TIBC
IRON: 53 ug/dL (ref 28–170)
SATURATION RATIOS: 11 % (ref 10.4–31.8)
TIBC: 480 ug/dL — AB (ref 250–450)
UIBC: 427 ug/dL

## 2016-03-14 LAB — TGAB+THYROGLOBULIN IMA OR RIA: Thyroglobulin Antibody: <1 IU/mL (ref 0.0–0.9)

## 2016-03-14 LAB — THYROID PANEL WITH TSH
Free Thyroxine Index: 2.8 (ref 1.2–4.9)
T3 Uptake Ratio: 28 % (ref 24–39)
T4, Total: 9.9 ug/dL (ref 4.5–12.0)
TSH: 0.766 u[IU]/mL (ref 0.450–4.500)

## 2016-03-14 LAB — THYROGLOBULIN BY IMA: THYROGLOBULIN BY: 0.4 ng/mL — AB (ref 1.5–38.5)

## 2016-03-15 ENCOUNTER — Other Ambulatory Visit: Payer: Medicare Other

## 2016-03-20 ENCOUNTER — Inpatient Hospital Stay (HOSPITAL_BASED_OUTPATIENT_CLINIC_OR_DEPARTMENT_OTHER): Payer: Medicare Other | Admitting: Internal Medicine

## 2016-03-20 ENCOUNTER — Ambulatory Visit: Payer: Medicare Other

## 2016-03-20 VITALS — BP 157/70 | HR 112 | Temp 97.0°F | Resp 18 | Ht 63.0 in | Wt 159.6 lb

## 2016-03-20 VITALS — BP 139/80 | HR 94 | Resp 18

## 2016-03-20 DIAGNOSIS — Z79899 Other long term (current) drug therapy: Secondary | ICD-10-CM | POA: Diagnosis not present

## 2016-03-20 DIAGNOSIS — Z85850 Personal history of malignant neoplasm of thyroid: Secondary | ICD-10-CM

## 2016-03-20 DIAGNOSIS — D500 Iron deficiency anemia secondary to blood loss (chronic): Secondary | ICD-10-CM | POA: Diagnosis not present

## 2016-03-20 DIAGNOSIS — D508 Other iron deficiency anemias: Secondary | ICD-10-CM

## 2016-03-20 MED ORDER — SODIUM CHLORIDE 0.9 % IV SOLN
Freq: Once | INTRAVENOUS | Status: AC
  Administered 2016-03-20: 12:00:00 via INTRAVENOUS
  Filled 2016-03-20: qty 1000

## 2016-03-20 MED ORDER — SODIUM CHLORIDE 0.9 % IV SOLN
510.0000 mg | Freq: Once | INTRAVENOUS | Status: AC
  Administered 2016-03-20: 510 mg via INTRAVENOUS
  Filled 2016-03-20: qty 17

## 2016-03-20 NOTE — Progress Notes (Signed)
[facility] CONSULT NOTE  Patient Care Team: Tracie Harrier, MD as PCP - General (Internal Medicine)  CHIEF COMPLAINTS/PURPOSE OF CONSULTATION: Anemia   # AUG 2015- IRON DEFICIENCY ANEMIA- [Oct 2016 hb~8.7;MCV-76 Ferritin- 13] s/p colo [2015] & EGD; April 2016- CT A/P- NEG; NOV 2016- Hb- 8.5/15-Ferritin-F[place]hem x2;  # Thyroid ca s/p S & RAIU [June 2009]  # Chronic tremors; Active smoker  HISTORY OF PRESENTING ILLNESS:  Martha [name] 70 y.o. pleasant female with iron deficiency anemia of unclear etiology [question malabsorption] is here for follow-up.  Patient felt improved energy after her IV iron appx 4 months ago.Marland Kitchen However again the last few weeks she noted to have worsening fatigue. She is chronic shortness of breath/underlying COPD.  Denies any blood in stools. Patient takes by mouth iron daily. She is having better energy post IV iron; she is able to walk better/study with a cane.   ROS: A complete 10 point review of system is done which is negative except mentioned above in history of present illness  MEDICAL HISTORY:  Past Medical History:  Diagnosis Date  . Abnormal LFTs (liver function tests)   . Adenomatous polyp of colon 2009  . Anxiety   . Arthritis   . Asthma   . Chronic bronchitis ([place])   . Chronic constipation   . Chronic low back pain   . Cigarette smoker    Has cut back Smoking to 1 pack every other day  . COPD (chronic obstructive pulmonary disease) ([place])   . Depression   . Diabetic neuropathy ([place])   . Diverticulitis 2015   gi recommended repeat scope in 10 years  . Essential hypertension   . Fatty liver   . GERD (gastroesophageal reflux disease)   . Gout   . History of mammogram 05/28/2013  . Hyperlipidemia   . Hypothyroidism   . Insomnia   . Iron deficiency anemia   . Low serum vitamin D   . Personal history of tobacco use, presenting hazards to health 09/29/2015  . Plantar fasciitis   . RLS (restless legs syndrome)   .  Shortness of breath dyspnea   . Thyroid cancer ([place]) 2009  . Tremors of nervous system    "I think I have parkinson's disease"  . Type 2 diabetes mellitus ([place])     SURGICAL HISTORY: Past Surgical History:  Procedure Laterality Date  . CATARACT EXTRACTION W/PHACO Left 11/24/2015   Procedure: CATARACT EXTRACTION PHACO AND INTRAOCULAR LENS PLACEMENT (IOC);  Surgeon: Birder Robson, MD;  Location: [facility] ORS;  Service: Ophthalmology;  Laterality: Left;  [place] 38.3 AP% 20.6 CDE 7.89 FLUID PACK LOT # [id number] H  . COLONOSCOPY  2004, 2009, 2015   [place]  . POLYPECTOMY  2009  . THYROIDECTOMY  2009   states she was tx with radiactive iodine  . TUBAL LIGATION    . UPPER GASTROINTESTINAL ENDOSCOPY      SOCIAL HISTORY: Social History   Social History  . Marital status: Divorced    Spouse name: N/A  . Number of children: N/A  . Years of education: N/A   Occupational History  . Not on file.   Social History Main Topics  . Smoking status: Former Smoker    Packs/day: 0.75    Years: 40.00    Types: Cigarettes  . Smokeless tobacco: Never Used     Comment: curently smokes 12 cigerattes a day. I'm trying to cut back.  . Alcohol use No  . Drug use: No  . Sexual  activity: No   Other Topics Concern  . Not on file   Social History Narrative  . No narrative on file    FAMILY HISTORY: Family History  Problem Relation Age of Onset  . Diabetes Mellitus II Father   . Lung disease Father   . Diabetes Mellitus II Mother   . Arthritis Mother   . Thyroid disease    . Asthma Sister   . Emphysema Sister   . Hypertension Sister     ALLERGIES:  is allergic to nicotine polacrilex; nicotrol [nicotine]; and codeine sulfate.  MEDICATIONS:  Current Outpatient Prescriptions  Medication Sig Dispense Refill  . aspirin EC 81 MG tablet Take 81 mg by mouth daily.     . cetirizine (ZYRTEC) 10 MG tablet Take 10 mg by mouth daily.     [name] Calcium (STOOL SOFTENER PO) Take 1 capsule by mouth  daily.    . ferrous sulfate 325 (65 FE) MG EC tablet Take 325 mg by mouth daily.     Marland Kitchen gabapentin (NEURONTIN) 600 MG tablet Take 600 mg by mouth 3 (three) times daily.    Marland Kitchen glipiZIDE (GLUCOTROL) 10 MG tablet Take 10 mg by mouth 2 (two) times daily.     . hydrochlorothiazide (HYDRODIURIL) 25 MG tablet Take 25 mg by mouth at bedtime.     Marland Kitchen levothyroxine (SYNTHROID, LEVOTHROID) 150 MCG tablet Take 150 mcg by mouth daily before breakfast.     . metFORMIN (GLUCOPHAGE) 1000 MG tablet Take 1,000 mg by mouth 2 (two) times daily.     Marland Kitchen PROAIR HFA 108 (90 BASE) MCG/ACT inhaler Inhale 2 Inhalers into the lungs every 6 (six) hours as needed for wheezing or shortness of breath.     . traZODone (DESYREL) 50 MG tablet Take 50 mg by mouth at bedtime as needed for sleep.    . valsartan (DIOVAN) 160 MG tablet Take 160 mg by mouth daily.     . vitamin B-12 (CYANOCOBALAMIN) 1000 MCG tablet Take 1,000 mcg by mouth daily.    . vitamin C (ASCORBIC ACID) 500 MG tablet Take 500 mg by mouth daily.    . Vitamin D, Ergocalciferol, (DRISDOL) 50000 UNITS CAPS capsule Take 50,000 Units by mouth every 7 (seven) days. Tuesday     No current facility-administered medications for this visit.       Marland Kitchen  PHYSICAL EXAMINATION:   Vitals:   03/20/16 1105  BP: (!) 157/70  Pulse: (!) 112  Resp: 18  Temp: 97 F (36.1 C)   Filed Weights   03/20/16 1105  Weight: 159 lb 9.8 oz (72.4 kg)    GENERAL: Well-nourished well-developed; Alert, no distress and comfortable. She walks with a cane.She has chronic tremors of her head. She is accompanied by family. EYES: no pallor or icterus OROPHARYNX: no thrush or ulceration; poor dentition NECK: supple, no masses felt LYMPH: no palpable lymphadenopathy in the cervical, axillary or inguinal regions LUNGS: Decreased breath sounds bilaterally No wheeze or crackles HEART/CVS: regular rate & rhythm and no murmurs; No lower extremity edema ABDOMEN: abdomen soft, non-tender and normal  bowel sounds Musculoskeletal:no cyanosis of digits and no clubbing  PSYCH: alert & oriented x 3 with fluent speech NEURO: no focal motor/sensory deficits SKIN: no rashes or significant lesions   LABORATORY DATA:  I have reviewed the data as listed Lab Results  Component Value Date   WBC 13.1 (H) 03/13/2016   HGB 12.9 03/13/2016   HCT 39.9 03/13/2016   MCV 84.5 03/13/2016  PLT 204 03/13/2016    Recent Labs  03/13/16 1020  NA 131*  K 3.9  CL 92*  CO2 27  GLUCOSE 256*  BUN 18  CREATININE 0.77  CALCIUM 9.8  GFRNONAA >60  GFRAA >60  PROT 7.8  ALBUMIN 4.1  AST 29  ALT 24  ALKPHOS 91  BILITOT 0.5     ASSESSMENT & PLAN:   Iron deficiency anemia due to chronic blood loss # Iron deficiency anemia- unclear etiology/probable malabsorption. Responds well to IV iron. Plan ferrahem today. hemoglobin is 13 however ferritin is 14 /trending down. Patient is symptomatic with worsening fatigue. Would recommend IV iron today.   # Thyroid cancer-status post RAIU [2009] clinically no recurrence of recurrence. On Synthroid.  # Smoker/ lung cancer screening program- CT scan June 2017- NED.   # follow-up in 6 months/labs week prior/M.D. Visit/IV ferrahem possible.     Cammie Sickle, MD 03/20/2016 1:13 PM

## 2016-03-20 NOTE — Assessment & Plan Note (Signed)
#   Iron deficiency anemia- unclear etiology/probable malabsorption. Responds well to IV iron. Plan ferrahem today. hemoglobin is 13 however ferritin is 14 /trending down. Patient is symptomatic with worsening fatigue. Would recommend IV iron today.   # Thyroid cancer-status post RAIU [2009] clinically no recurrence of recurrence. On Synthroid.  # Smoker/ lung cancer screening program- CT scan June 2017- NED.   # follow-up in 6 months/labs week prior/M.D. Visit/IV ferrahem possible.

## 2016-09-11 ENCOUNTER — Inpatient Hospital Stay: Payer: Medicare Other | Attending: Internal Medicine

## 2016-09-11 DIAGNOSIS — J449 Chronic obstructive pulmonary disease, unspecified: Secondary | ICD-10-CM | POA: Diagnosis not present

## 2016-09-11 DIAGNOSIS — G2581 Restless legs syndrome: Secondary | ICD-10-CM | POA: Insufficient documentation

## 2016-09-11 DIAGNOSIS — E785 Hyperlipidemia, unspecified: Secondary | ICD-10-CM | POA: Diagnosis not present

## 2016-09-11 DIAGNOSIS — Z85850 Personal history of malignant neoplasm of thyroid: Secondary | ICD-10-CM | POA: Diagnosis not present

## 2016-09-11 DIAGNOSIS — K219 Gastro-esophageal reflux disease without esophagitis: Secondary | ICD-10-CM | POA: Insufficient documentation

## 2016-09-11 DIAGNOSIS — E039 Hypothyroidism, unspecified: Secondary | ICD-10-CM | POA: Insufficient documentation

## 2016-09-11 DIAGNOSIS — D509 Iron deficiency anemia, unspecified: Secondary | ICD-10-CM | POA: Diagnosis present

## 2016-09-11 DIAGNOSIS — Z79899 Other long term (current) drug therapy: Secondary | ICD-10-CM | POA: Insufficient documentation

## 2016-09-11 DIAGNOSIS — F419 Anxiety disorder, unspecified: Secondary | ICD-10-CM | POA: Insufficient documentation

## 2016-09-11 DIAGNOSIS — D500 Iron deficiency anemia secondary to blood loss (chronic): Secondary | ICD-10-CM

## 2016-09-11 DIAGNOSIS — E871 Hypo-osmolality and hyponatremia: Secondary | ICD-10-CM | POA: Diagnosis not present

## 2016-09-11 DIAGNOSIS — F329 Major depressive disorder, single episode, unspecified: Secondary | ICD-10-CM | POA: Diagnosis not present

## 2016-09-11 DIAGNOSIS — M109 Gout, unspecified: Secondary | ICD-10-CM | POA: Diagnosis not present

## 2016-09-11 DIAGNOSIS — I10 Essential (primary) hypertension: Secondary | ICD-10-CM | POA: Insufficient documentation

## 2016-09-11 DIAGNOSIS — Z7982 Long term (current) use of aspirin: Secondary | ICD-10-CM | POA: Insufficient documentation

## 2016-09-11 DIAGNOSIS — F17210 Nicotine dependence, cigarettes, uncomplicated: Secondary | ICD-10-CM | POA: Diagnosis not present

## 2016-09-11 DIAGNOSIS — Z7984 Long term (current) use of oral hypoglycemic drugs: Secondary | ICD-10-CM | POA: Diagnosis not present

## 2016-09-11 DIAGNOSIS — E1140 Type 2 diabetes mellitus with diabetic neuropathy, unspecified: Secondary | ICD-10-CM | POA: Diagnosis not present

## 2016-09-11 DIAGNOSIS — G4700 Insomnia, unspecified: Secondary | ICD-10-CM | POA: Diagnosis not present

## 2016-09-11 LAB — IRON AND TIBC
Iron: 47 ug/dL (ref 28–170)
Saturation Ratios: 9 % — ABNORMAL LOW (ref 10.4–31.8)
TIBC: 516 ug/dL — ABNORMAL HIGH (ref 250–450)
UIBC: 469 ug/dL

## 2016-09-11 LAB — CBC WITH DIFFERENTIAL/PLATELET
Basophils Absolute: 0.1 10*3/uL (ref 0–0.1)
Basophils Relative: 1 %
Eosinophils Absolute: 0.3 10*3/uL (ref 0–0.7)
Eosinophils Relative: 2 %
HEMATOCRIT: 38.5 % (ref 35.0–47.0)
Hemoglobin: 12.5 g/dL (ref 12.0–16.0)
LYMPHS PCT: 11 %
Lymphs Abs: 1.5 10*3/uL (ref 1.0–3.6)
MCH: 26 pg (ref 26.0–34.0)
MCHC: 32.4 g/dL (ref 32.0–36.0)
MCV: 80.3 fL (ref 80.0–100.0)
MONO ABS: 0.7 10*3/uL (ref 0.2–0.9)
Monocytes Relative: 5 %
NEUTROS ABS: 11.3 10*3/uL — AB (ref 1.4–6.5)
Neutrophils Relative %: 81 %
Platelets: 314 10*3/uL (ref 150–440)
RBC: 4.79 MIL/uL (ref 3.80–5.20)
RDW: 17 % — ABNORMAL HIGH (ref 11.5–14.5)
WBC: 14 10*3/uL — ABNORMAL HIGH (ref 3.6–11.0)

## 2016-09-11 LAB — BASIC METABOLIC PANEL
Anion gap: 10 (ref 5–15)
BUN: 24 mg/dL — AB (ref 6–20)
CALCIUM: 9.5 mg/dL (ref 8.9–10.3)
CHLORIDE: 94 mmol/L — AB (ref 101–111)
CO2: 24 mmol/L (ref 22–32)
CREATININE: 1.1 mg/dL — AB (ref 0.44–1.00)
GFR calc Af Amer: 57 mL/min — ABNORMAL LOW (ref >60)
GFR, EST NON AFRICAN AMERICAN: 49 mL/min — AB (ref >60)
Glucose, Bld: 153 mg/dL — ABNORMAL HIGH (ref 65–99)
Potassium: 4.6 mmol/L (ref 3.5–5.1)
SODIUM: 128 mmol/L — AB (ref 135–145)

## 2016-09-11 LAB — FERRITIN: FERRITIN: 14 ng/mL (ref 11–307)

## 2016-09-18 ENCOUNTER — Inpatient Hospital Stay: Payer: Medicare Other

## 2016-09-18 ENCOUNTER — Inpatient Hospital Stay (HOSPITAL_BASED_OUTPATIENT_CLINIC_OR_DEPARTMENT_OTHER): Payer: Medicare Other | Admitting: Internal Medicine

## 2016-09-18 VITALS — BP 139/69 | HR 94 | Temp 97.7°F | Resp 20 | Wt 161.2 lb

## 2016-09-18 VITALS — BP 128/67 | HR 77 | Resp 20

## 2016-09-18 DIAGNOSIS — D509 Iron deficiency anemia, unspecified: Secondary | ICD-10-CM | POA: Diagnosis not present

## 2016-09-18 DIAGNOSIS — D508 Other iron deficiency anemias: Secondary | ICD-10-CM

## 2016-09-18 DIAGNOSIS — Z85850 Personal history of malignant neoplasm of thyroid: Secondary | ICD-10-CM | POA: Diagnosis not present

## 2016-09-18 DIAGNOSIS — D500 Iron deficiency anemia secondary to blood loss (chronic): Secondary | ICD-10-CM

## 2016-09-18 DIAGNOSIS — E871 Hypo-osmolality and hyponatremia: Secondary | ICD-10-CM | POA: Diagnosis not present

## 2016-09-18 DIAGNOSIS — Z79899 Other long term (current) drug therapy: Secondary | ICD-10-CM | POA: Diagnosis not present

## 2016-09-18 DIAGNOSIS — F17210 Nicotine dependence, cigarettes, uncomplicated: Secondary | ICD-10-CM | POA: Diagnosis not present

## 2016-09-18 DIAGNOSIS — C73 Malignant neoplasm of thyroid gland: Secondary | ICD-10-CM

## 2016-09-18 MED ORDER — SODIUM CHLORIDE 0.9 % IV SOLN
510.0000 mg | Freq: Once | INTRAVENOUS | Status: AC
  Administered 2016-09-18: 510 mg via INTRAVENOUS
  Filled 2016-09-18: qty 17

## 2016-09-18 MED ORDER — SODIUM CHLORIDE 0.9 % IV SOLN
Freq: Once | INTRAVENOUS | Status: AC
  Administered 2016-09-18: 12:00:00 via INTRAVENOUS
  Filled 2016-09-18: qty 1000

## 2016-09-18 NOTE — Patient Instructions (Signed)

## 2016-09-18 NOTE — Assessment & Plan Note (Addendum)
#   Iron deficiency anemia- unclear etiology/probable malabsorption. Responds well to IV iron. Plan ferrahem today. hemoglobin is 12 however ferritin is 14 /trending down. Patient is symptomatic with worsening fatigue. Would recommend IV iron today.   # Hyponatremia sodium 128/ Creat slightly up - 1.1 [baseline 0.9]; recommend holding off NSAIDs; okay with tylenol. [Hx of DM]; on HCTZ.  Defer to PCP.   # Thyroid cancer-status post RAIU [2009] clinically no recurrence of recurrence. On Synthroid. Check thyroglobulin levels; thyroid profile in 6 months/next visit.  # Smoker/ lung cancer screening program- CT scan June 2017- NED. Discussed re: quitting; not interested.   # IV ferrahem today; next week.  follow-up in 6 months/labs week prior/M.D. Visit/IV ferrahem possible.   Cc: Dr.Hande.

## 2016-09-18 NOTE — Progress Notes (Signed)
[facility] NOTE  Patient Care Team: Tracie Harrier, MD as PCP - General (Internal Medicine)  CHIEF COMPLAINTS/PURPOSE OF CONSULTATION: Anemia   # AUG 2015- IRON DEFICIENCY ANEMIA- [Oct 2016 hb~8.7;MCV-76 Ferritin- 13] s/p colo [2015] & EGD; April 2016- CT A/P- NEG; NOV 2016- Hb- 8.5/15-Ferritin-Ferrahem x2;  # Thyroid ca s/p S & RAIU [June 2009]  # Chronic tremors; Active smoker  HISTORY OF PRESENTING ILLNESS:  Martha [name] 71 y.o. pleasant female with iron deficiency anemia of unclear etiology [question malabsorption] is here for follow-up.  Patient felt improved energy after her IV iron appx 6 months ago.Marland Kitchen However again the last few months she noted to have worsening fatigue. She is chronic shortness of breath/underlying COPD.   Denies any blood in stools. She walks with a cane. Denies any bone pain.  ROS: A complete 10 point review of system is done which is negative except mentioned above in history of present illness  MEDICAL HISTORY:  Past Medical History:  Diagnosis Date  . Abnormal LFTs (liver function tests)   . Adenomatous polyp of colon 2009  . Anxiety   . Arthritis   . Asthma   . Chronic bronchitis ([place])   . Chronic constipation   . Chronic low back pain   . Cigarette smoker    Has cut back Smoking to 1 pack every other day  . COPD (chronic obstructive pulmonary disease) ([place])   . Depression   . Diabetic neuropathy ([place])   . Diverticulitis 2015   gi recommended repeat scope in 10 years  . Essential hypertension   . Fatty liver   . GERD (gastroesophageal reflux disease)   . Gout   . History of mammogram 05/28/2013  . Hyperlipidemia   . Hypothyroidism   . Insomnia   . Iron deficiency anemia   . Low serum vitamin D   . Personal history of tobacco use, presenting hazards to health 09/29/2015  . Plantar fasciitis   . RLS (restless legs syndrome)   . Shortness of breath dyspnea   . Thyroid cancer ([place]) 2009  . Tremors of nervous  system    "I think I have parkinson's disease"  . Type 2 diabetes mellitus ([place])     SURGICAL HISTORY: Past Surgical History:  Procedure Laterality Date  . CATARACT EXTRACTION W/PHACO Left 11/24/2015   Procedure: CATARACT EXTRACTION PHACO AND INTRAOCULAR LENS PLACEMENT (IOC);  Surgeon: Birder Robson, MD;  Location: [facility] ORS;  Service: Ophthalmology;  Laterality: Left;  [place] 38.3 AP% 20.6 CDE 7.89 FLUID PACK LOT # [id number] H  . COLONOSCOPY  2004, 2009, 2015   [place]  . POLYPECTOMY  2009  . THYROIDECTOMY  2009   states she was tx with radiactive iodine  . TUBAL LIGATION    . UPPER GASTROINTESTINAL ENDOSCOPY      SOCIAL HISTORY: Social History   Social History  . Marital status: Divorced    Spouse name: N/A  . Number of children: N/A  . Years of education: N/A   Occupational History  . Not on file.   Social History Main Topics  . Smoking status: Former Smoker    Packs/day: 0.75    Years: 40.00    Types: Cigarettes  . Smokeless tobacco: Never Used     Comment: curently smokes 12 cigerattes a day. I'm trying to cut back.  . Alcohol use No  . Drug use: No  . Sexual activity: No   Other Topics Concern  . Not on file  Social History Narrative  . No narrative on file    FAMILY HISTORY: Family History  Problem Relation Age of Onset  . Diabetes Mellitus II Father   . Lung disease Father   . Diabetes Mellitus II Mother   . Arthritis Mother   . Thyroid disease Unknown   . Asthma Sister   . Emphysema Sister   . Hypertension Sister     ALLERGIES:  is allergic to nicotine polacrilex; nicotrol [nicotine]; and codeine sulfate.  MEDICATIONS:  Current Outpatient Prescriptions  Medication Sig Dispense Refill  . aspirin EC 81 MG tablet Take 81 mg by mouth daily.     . cetirizine (ZYRTEC) 10 MG tablet Take 10 mg by mouth daily.     [name] Calcium (STOOL SOFTENER PO) Take 1 capsule by mouth daily.    . ferrous sulfate 325 (65 FE) MG EC tablet Take 325 mg by mouth  daily.     Marland Kitchen gabapentin (NEURONTIN) 600 MG tablet Take 600 mg by mouth 3 (three) times daily.    Marland Kitchen glipiZIDE (GLUCOTROL) 10 MG tablet Take 10 mg by mouth 2 (two) times daily.     . hydrochlorothiazide (HYDRODIURIL) 25 MG tablet Take 25 mg by mouth at bedtime.     Marland Kitchen levothyroxine (SYNTHROID, LEVOTHROID) 150 MCG tablet Take 150 mcg by mouth daily before breakfast.     . metFORMIN (GLUCOPHAGE) 1000 MG tablet Take 1,000 mg by mouth 2 (two) times daily.     . primidone (MYSOLINE) 50 MG tablet 50 mg at bedtime.     Marland Kitchen PROAIR HFA 108 (90 BASE) MCG/ACT inhaler Inhale 2 Inhalers into the lungs every 6 (six) hours as needed for wheezing or shortness of breath.     . traZODone (DESYREL) 50 MG tablet Take 50 mg by mouth at bedtime as needed for sleep.    . valsartan (DIOVAN) 160 MG tablet Take 160 mg by mouth daily.     . vitamin B-12 (CYANOCOBALAMIN) 1000 MCG tablet Take 1,000 mcg by mouth daily.    . vitamin C (ASCORBIC ACID) 500 MG tablet Take 500 mg by mouth daily.    . Vitamin D, Ergocalciferol, (DRISDOL) 50000 UNITS CAPS capsule Take 50,000 Units by mouth every 7 (seven) days. Tuesday     No current facility-administered medications for this visit.       Marland Kitchen  PHYSICAL EXAMINATION:   Vitals:   09/18/16 1109  BP: 139/69  Pulse: 94  Resp: 20  Temp: 97.7 F (36.5 C)   Filed Weights   09/18/16 1109  Weight: 161 lb 2.5 oz (73.1 kg)    GENERAL: Well-nourished well-developed; Alert, no distress and comfortable. She walks with a cane.She has chronic tremors of her head. She is alone.  EYES: no pallor or icterus OROPHARYNX: no thrush or ulceration; poor dentition NECK: supple, no masses felt LYMPH: no palpable lymphadenopathy in the cervical, axillary or inguinal regions LUNGS: Decreased breath sounds bilaterally No wheeze or crackles HEART/CVS: regular rate & rhythm and no murmurs; No lower extremity edema ABDOMEN: abdomen soft, non-tender and normal bowel sounds Musculoskeletal:no  cyanosis of digits and no clubbing  PSYCH: alert & oriented x 3 with fluent speech NEURO: no focal motor/sensory deficits SKIN: no rashes or significant lesions   LABORATORY DATA:  I have reviewed the data as listed Lab Results  Component Value Date   WBC 14.0 (H) 09/11/2016   HGB 12.5 09/11/2016   HCT 38.5 09/11/2016   MCV 80.3 09/11/2016   PLT 314  09/11/2016    Recent Labs  03/13/16 1020 09/11/16 1047  NA 131* 128*  K 3.9 4.6  CL 92* 94*  CO2 27 24  GLUCOSE 256* 153*  BUN 18 24*  CREATININE 0.77 1.10*  CALCIUM 9.8 9.5  GFRNONAA >60 49*  GFRAA >60 57*  PROT 7.8  --   ALBUMIN 4.1  --   AST 29  --   ALT 24  --   ALKPHOS 91  --   BILITOT 0.5  --      ASSESSMENT & PLAN:   Iron deficiency anemia due to chronic blood loss # Iron deficiency anemia- unclear etiology/probable malabsorption. Responds well to IV iron. Plan ferrahem today. hemoglobin is 12 however ferritin is 14 /trending down. Patient is symptomatic with worsening fatigue. Would recommend IV iron today.   # Hyponatremia sodium 128/ Creat slightly up - 1.1 [baseline 0.9]; recommend holding off NSAIDs; okay with tylenol. [Hx of DM]; on HCTZ.  Defer to PCP.   # Thyroid cancer-status post RAIU [2009] clinically no recurrence of recurrence. On Synthroid. Check thyroglobulin levels; thyroid profile in 6 months/next visit.  # Smoker/ lung cancer screening program- CT scan June 2017- NED. Discussed re: quitting; not interested.   # IV ferrahem today; next week.  follow-up in 6 months/labs week prior/M.D. Visit/IV ferrahem possible.   Cc: Dr.Hande.     Cammie Sickle, MD 09/18/2016 5:28 PM

## 2016-09-18 NOTE — Progress Notes (Signed)
Here for follow up

## 2016-09-26 ENCOUNTER — Inpatient Hospital Stay: Payer: Medicare Other

## 2016-09-26 VITALS — BP 115/70 | HR 123 | Temp 98.3°F | Resp 20

## 2016-09-26 DIAGNOSIS — D509 Iron deficiency anemia, unspecified: Secondary | ICD-10-CM | POA: Diagnosis not present

## 2016-09-26 DIAGNOSIS — D508 Other iron deficiency anemias: Secondary | ICD-10-CM

## 2016-09-26 MED ORDER — SODIUM CHLORIDE 0.9 % IV SOLN
510.0000 mg | Freq: Once | INTRAVENOUS | Status: AC
  Administered 2016-09-26: 510 mg via INTRAVENOUS
  Filled 2016-09-26: qty 17

## 2016-09-26 MED ORDER — SODIUM CHLORIDE 0.9 % IV SOLN
Freq: Once | INTRAVENOUS | Status: AC
  Administered 2016-09-26: 11:00:00 via INTRAVENOUS
  Filled 2016-09-26: qty 1000

## 2016-09-26 NOTE — Patient Instructions (Signed)

## 2016-09-27 ENCOUNTER — Telehealth: Payer: Self-pay | Admitting: *Deleted

## 2016-09-27 NOTE — Telephone Encounter (Signed)
Notified patient that annual lung cancer screening low dose CT scan is due currently or will be in near future. Patient requests contact at end of month due to her transportation person having foot surgery next Monday.

## 2016-11-08 ENCOUNTER — Telehealth: Payer: Self-pay | Admitting: *Deleted

## 2016-11-08 DIAGNOSIS — Z87891 Personal history of nicotine dependence: Secondary | ICD-10-CM

## 2016-11-08 NOTE — Telephone Encounter (Signed)
Notified patient that annual lung cancer screening low dose CT scan is due currently or will be in near future. Confirmed that patient is within the age range of 55-77, and asymptomatic, (no signs or symptoms of lung cancer). Patient denies illness that would prevent curative treatment for lung cancer if found. Verified smoking history, (current, 38 pack year). The shared decision making visit was done 09/30/15. Patient is agreeable for CT scan being scheduled.

## 2016-11-23 ENCOUNTER — Ambulatory Visit
Admission: RE | Admit: 2016-11-23 | Discharge: 2016-11-23 | Disposition: A | Discharge status: Home / Self Care | Payer: Medicare Other | Source: Ambulatory Visit | Attending: Oncology | Admitting: Oncology

## 2016-11-23 DIAGNOSIS — F17210 Nicotine dependence, cigarettes, uncomplicated: Secondary | ICD-10-CM | POA: Diagnosis not present

## 2016-11-23 DIAGNOSIS — R590 Localized enlarged lymph nodes: Secondary | ICD-10-CM | POA: Diagnosis not present

## 2016-11-23 DIAGNOSIS — I700 Atherosclerosis of aorta: Secondary | ICD-10-CM | POA: Insufficient documentation

## 2016-11-23 DIAGNOSIS — Z87891 Personal history of nicotine dependence: Secondary | ICD-10-CM

## 2016-11-23 DIAGNOSIS — I2510 Atherosclerotic heart disease of native coronary artery without angina pectoris: Secondary | ICD-10-CM | POA: Insufficient documentation

## 2016-11-23 DIAGNOSIS — Z122 Encounter for screening for malignant neoplasm of respiratory organs: Secondary | ICD-10-CM | POA: Insufficient documentation

## 2016-11-23 DIAGNOSIS — J438 Other emphysema: Secondary | ICD-10-CM | POA: Diagnosis not present

## 2016-11-23 DIAGNOSIS — J432 Centrilobular emphysema: Secondary | ICD-10-CM | POA: Diagnosis not present

## 2016-11-27 ENCOUNTER — Encounter: Payer: Self-pay | Admitting: *Deleted

## 2016-12-22 IMAGING — CT CT CHEST LUNG CANCER SCREENING LOW DOSE W/O CM
1 series · 15 of 33 positions shown, 19 images · non-contrast
Comparison: None.

CLINICAL DATA: 69-year-old female current smoker, with 30 pack-year
history of smoking, for initial lung cancer screening

EXAM:
CT CHEST WITHOUT CONTRAST LOW-DOSE FOR LUNG CANCER SCREENING
TECHNIQUE: Multidetector CT imaging of the chest was performed following the
standard protocol without IV contrast.

[Series 2: axial st · axial · 0.71mm/px · z∈[-717,-467]mm · 15 of 60 slices shown, 19 images]
[im 5/60  mediastinal]
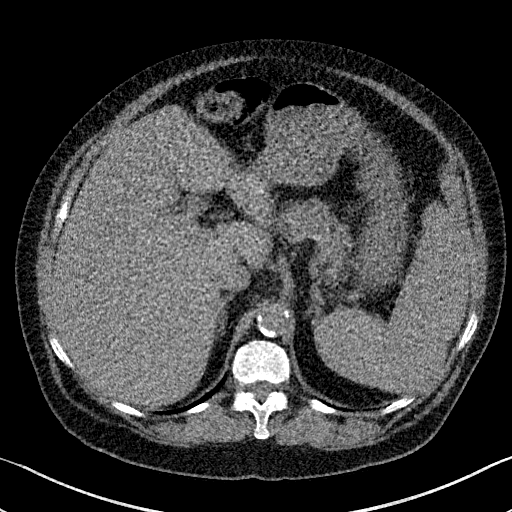
[im 5/60  lung]
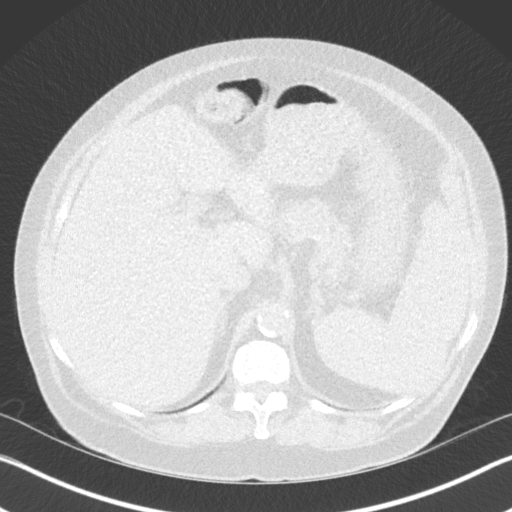
[im 9/60  lung]
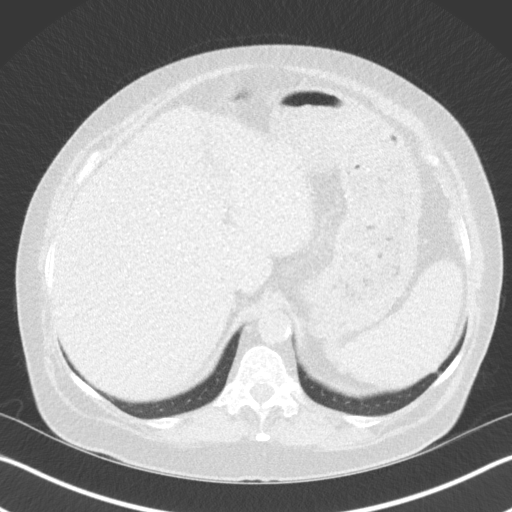
[im 12/60  lung]
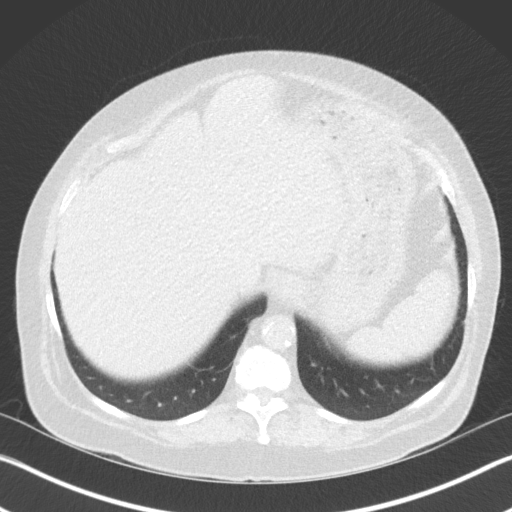
[im 16/60  lung]
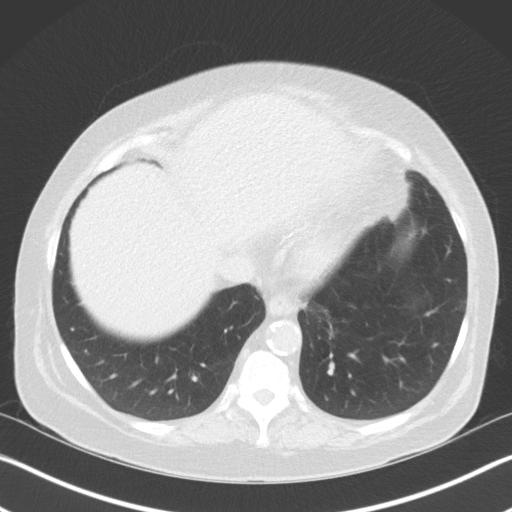
[im 20/60  mediastinal]
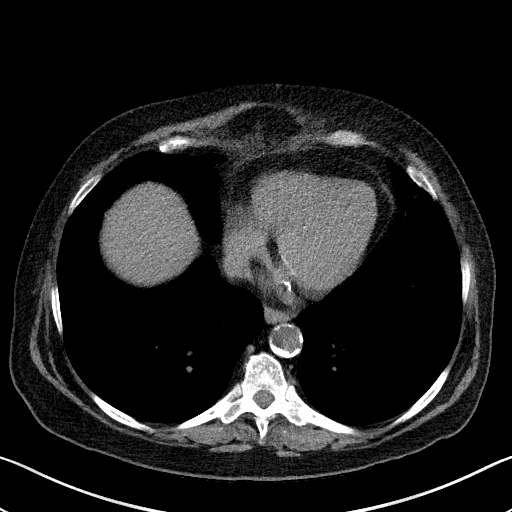
[im 20/60  lung]
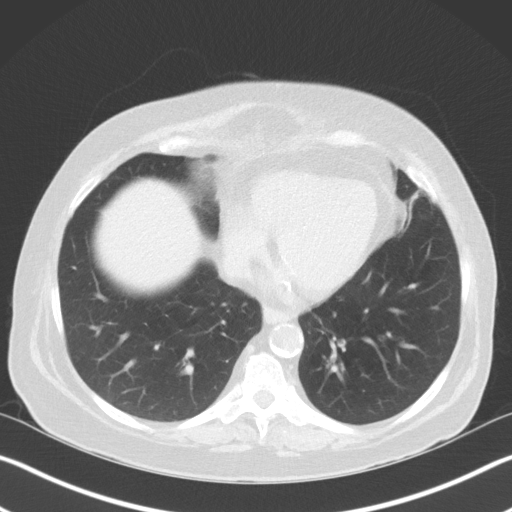
[im 24/60  lung]
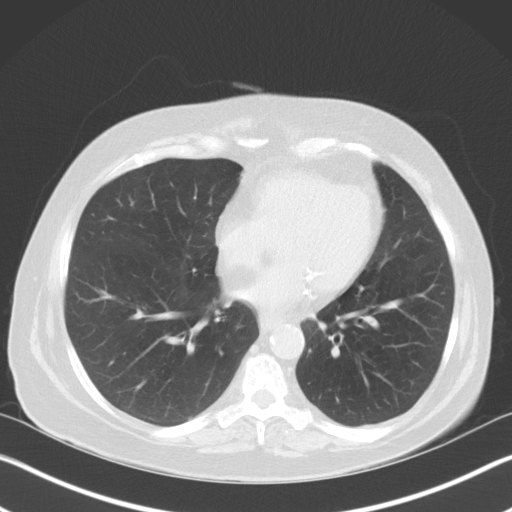
[im 27/60  lung]
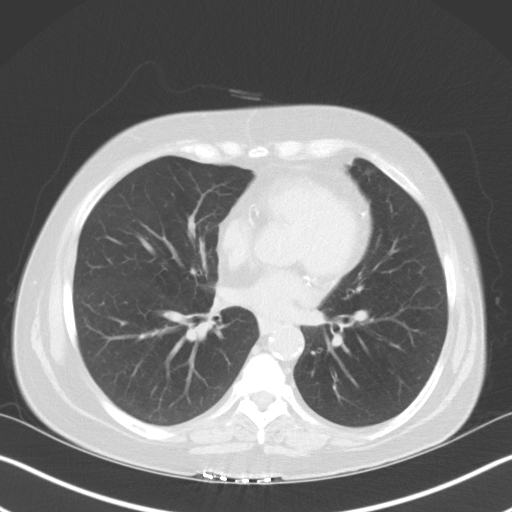
[im 31/60  lung]
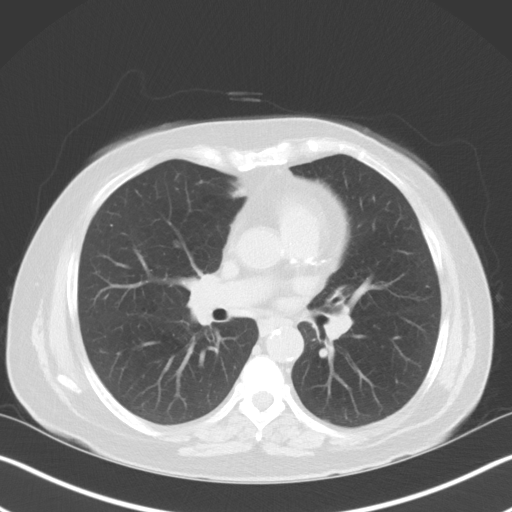
[im 33/60  mediastinal]
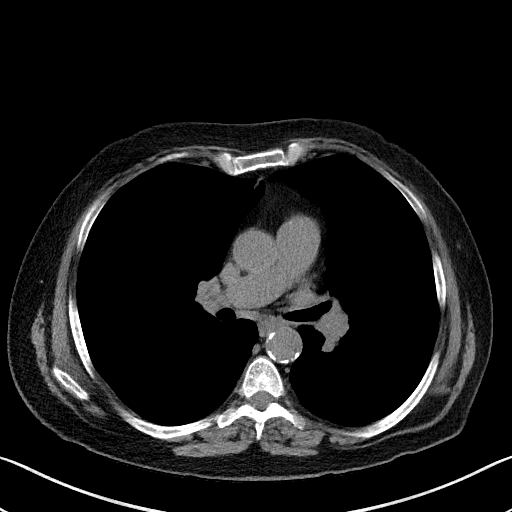
[im 33/60  lung]
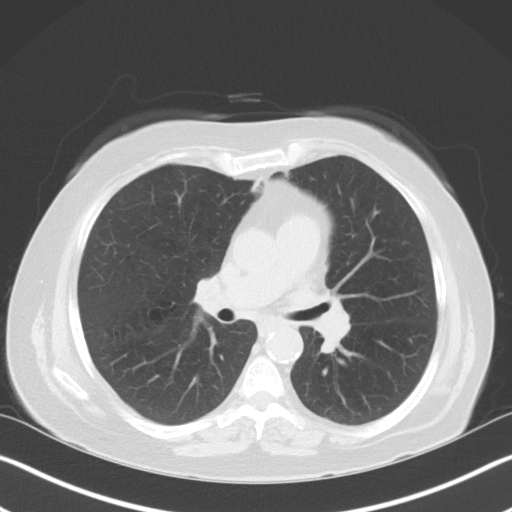
[im 36/60  lung]
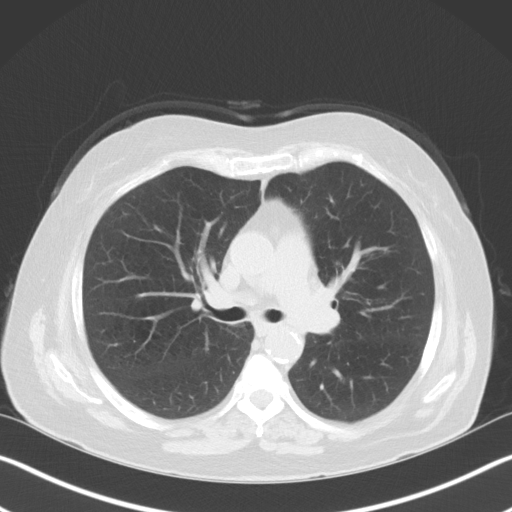
[im 40/60  lung]
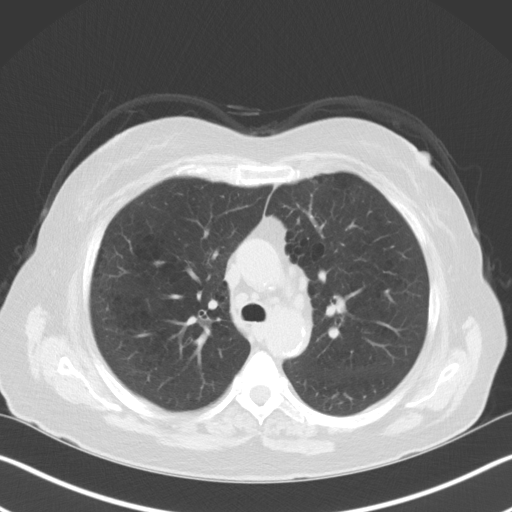
[im 44/60  lung]
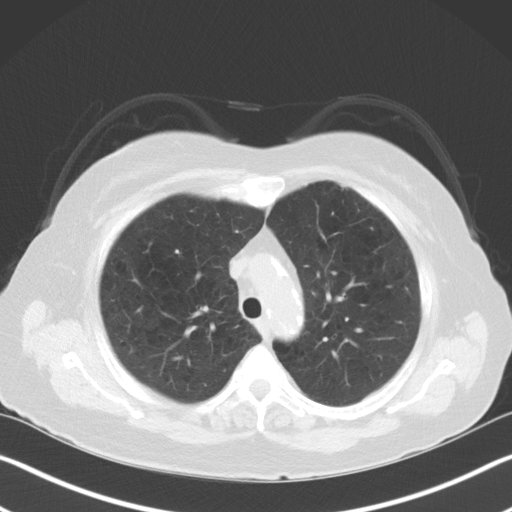
[im 48/60  mediastinal]
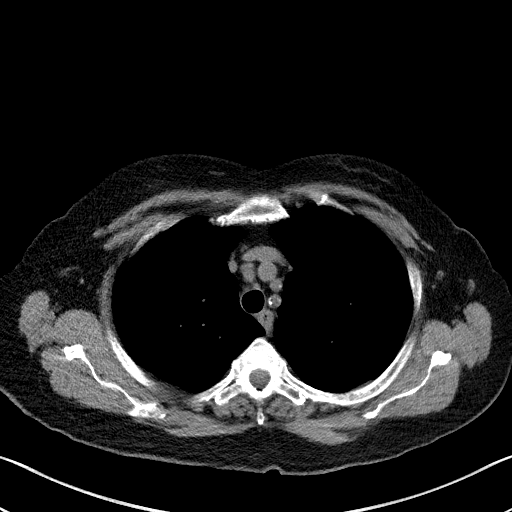
[im 48/60  lung]
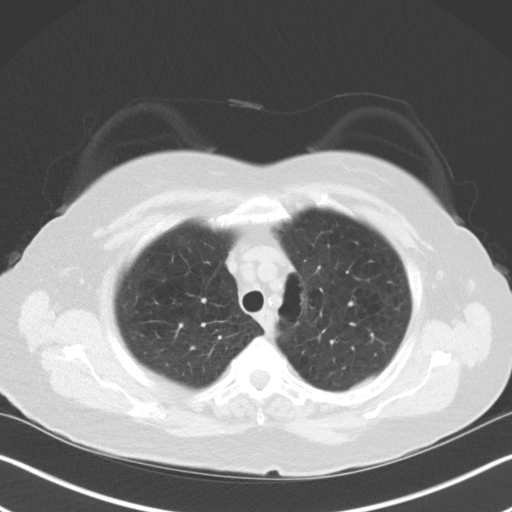
[im 51/60  lung]
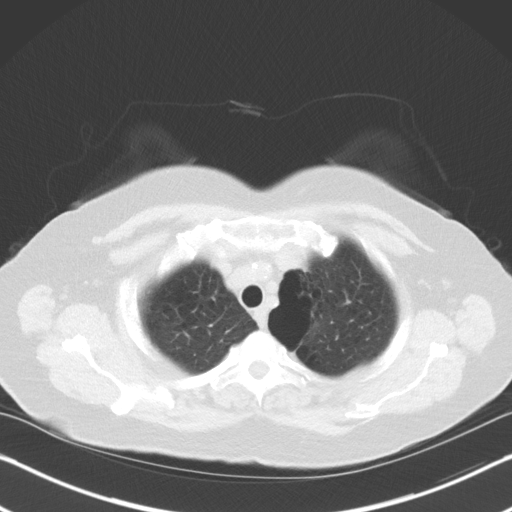
[im 55/60  lung]
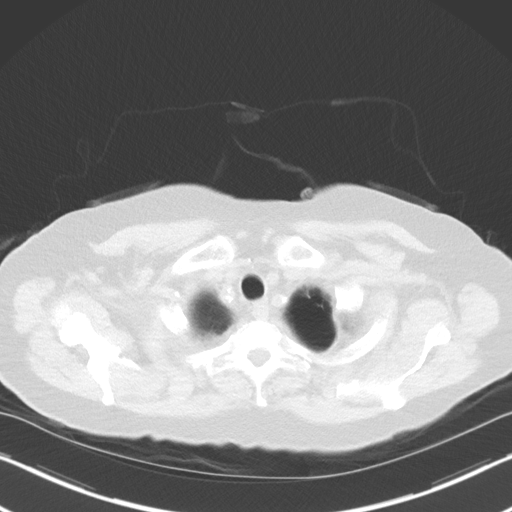

[15 of 33 positions shown; findings below may reference images not displayed]

FINDINGS: Mediastinum/Nodes: The heart is normal in size. No pericardial
effusion.

Three vessel coronary atherosclerosis.

Atherosclerotic calcifications of the aortic arch.

Small mediastinal lymph nodes, including a 9 mm short axis low right
paratracheal node (series 2/image 24), within the upper limits of
normal.

Status post thyroidectomy.

Lungs/Pleura: No suspicious pulmonary nodules.

Moderate centrilobular and paraseptal emphysematous changes, with
bullous changes in the medial left lung apex.

Biapical pleural-parenchymal scarring.

No focal consolidation.

No pleural effusion or pneumothorax.

Upper abdomen: Visualized upper abdomen is notable for mild hepatic
steatosis and vascular calcifications.

Musculoskeletal: Mild degenerative changes of the visualized
thoracolumbar spine.
IMPRESSION: No suspicious pulmonary nodules. Lung-RADS Category 1, negative.
Continue annual screening with low-dose chest CT without contrast in
12 months. Pack

Moderate emphysematous changes, with bullous changes in the medial
left lung apex.

## 2017-03-12 ENCOUNTER — Inpatient Hospital Stay: Payer: Medicare Other | Attending: Internal Medicine

## 2017-03-12 ENCOUNTER — Other Ambulatory Visit: Payer: Self-pay

## 2017-03-12 ENCOUNTER — Encounter: Payer: Self-pay | Admitting: Emergency Medicine

## 2017-03-12 ENCOUNTER — Ambulatory Visit
Admission: EM | Admit: 2017-03-12 | Discharge: 2017-03-12 | Discharge status: Against Medical Advice | Payer: Medicare Other | Attending: Family Medicine | Admitting: Family Medicine

## 2017-03-12 DIAGNOSIS — M109 Gout, unspecified: Secondary | ICD-10-CM | POA: Insufficient documentation

## 2017-03-12 DIAGNOSIS — F17210 Nicotine dependence, cigarettes, uncomplicated: Secondary | ICD-10-CM | POA: Insufficient documentation

## 2017-03-12 DIAGNOSIS — E871 Hypo-osmolality and hyponatremia: Secondary | ICD-10-CM | POA: Insufficient documentation

## 2017-03-12 DIAGNOSIS — C73 Malignant neoplasm of thyroid gland: Secondary | ICD-10-CM

## 2017-03-12 DIAGNOSIS — E1140 Type 2 diabetes mellitus with diabetic neuropathy, unspecified: Secondary | ICD-10-CM | POA: Insufficient documentation

## 2017-03-12 DIAGNOSIS — E039 Hypothyroidism, unspecified: Secondary | ICD-10-CM | POA: Diagnosis not present

## 2017-03-12 DIAGNOSIS — D500 Iron deficiency anemia secondary to blood loss (chronic): Secondary | ICD-10-CM | POA: Diagnosis not present

## 2017-03-12 DIAGNOSIS — J069 Acute upper respiratory infection, unspecified: Secondary | ICD-10-CM | POA: Insufficient documentation

## 2017-03-12 DIAGNOSIS — F329 Major depressive disorder, single episode, unspecified: Secondary | ICD-10-CM | POA: Diagnosis not present

## 2017-03-12 DIAGNOSIS — Z7984 Long term (current) use of oral hypoglycemic drugs: Secondary | ICD-10-CM | POA: Diagnosis not present

## 2017-03-12 DIAGNOSIS — J449 Chronic obstructive pulmonary disease, unspecified: Secondary | ICD-10-CM | POA: Diagnosis not present

## 2017-03-12 DIAGNOSIS — K219 Gastro-esophageal reflux disease without esophagitis: Secondary | ICD-10-CM | POA: Diagnosis not present

## 2017-03-12 DIAGNOSIS — Z79899 Other long term (current) drug therapy: Secondary | ICD-10-CM | POA: Diagnosis not present

## 2017-03-12 DIAGNOSIS — Z85850 Personal history of malignant neoplasm of thyroid: Secondary | ICD-10-CM | POA: Diagnosis not present

## 2017-03-12 DIAGNOSIS — I10 Essential (primary) hypertension: Secondary | ICD-10-CM | POA: Diagnosis not present

## 2017-03-12 DIAGNOSIS — G2581 Restless legs syndrome: Secondary | ICD-10-CM | POA: Diagnosis not present

## 2017-03-12 DIAGNOSIS — Z7982 Long term (current) use of aspirin: Secondary | ICD-10-CM | POA: Insufficient documentation

## 2017-03-12 DIAGNOSIS — E785 Hyperlipidemia, unspecified: Secondary | ICD-10-CM | POA: Insufficient documentation

## 2017-03-12 DIAGNOSIS — G4700 Insomnia, unspecified: Secondary | ICD-10-CM | POA: Diagnosis not present

## 2017-03-12 DIAGNOSIS — F419 Anxiety disorder, unspecified: Secondary | ICD-10-CM | POA: Diagnosis not present

## 2017-03-12 LAB — CBC WITH DIFFERENTIAL/PLATELET
Basophils Absolute: 0.1 10*3/uL (ref 0–0.1)
Basophils Relative: 1 %
EOS PCT: 3 %
Eosinophils Absolute: 0.4 10*3/uL (ref 0–0.7)
HCT: 39.1 % (ref 35.0–47.0)
Hemoglobin: 13.1 g/dL (ref 12.0–16.0)
LYMPHS ABS: 1.3 10*3/uL (ref 1.0–3.6)
LYMPHS PCT: 10 %
MCH: 28.5 pg (ref 26.0–34.0)
MCHC: 33.4 g/dL (ref 32.0–36.0)
MCV: 85.2 fL (ref 80.0–100.0)
MONO ABS: 0.6 10*3/uL (ref 0.2–0.9)
Monocytes Relative: 5 %
Neutro Abs: 11.4 10*3/uL — ABNORMAL HIGH (ref 1.4–6.5)
Neutrophils Relative %: 81 %
PLATELETS: 407 10*3/uL (ref 150–440)
RBC: 4.58 MIL/uL (ref 3.80–5.20)
RDW: 14.9 % — AB (ref 11.5–14.5)
WBC: 13.9 10*3/uL — ABNORMAL HIGH (ref 3.6–11.0)

## 2017-03-12 LAB — IRON AND TIBC
IRON: 35 ug/dL (ref 28–170)
Saturation Ratios: 10 % — ABNORMAL LOW (ref 10.4–31.8)
TIBC: 341 ug/dL (ref 250–450)
UIBC: 306 ug/dL

## 2017-03-12 LAB — BASIC METABOLIC PANEL
Anion gap: 10 (ref 5–15)
BUN: 21 mg/dL — ABNORMAL HIGH (ref 6–20)
CO2: 25 mmol/L (ref 22–32)
Calcium: 8.9 mg/dL (ref 8.9–10.3)
Chloride: 93 mmol/L — ABNORMAL LOW (ref 101–111)
Creatinine, Ser: 0.69 mg/dL (ref 0.44–1.00)
GFR calc Af Amer: >60 mL/min (ref >60)
GFR calc non Af Amer: >60 mL/min (ref >60)
Glucose, Bld: 149 mg/dL — ABNORMAL HIGH (ref 65–99)
Potassium: 4.1 mmol/L (ref 3.5–5.1)
Sodium: 128 mmol/L — ABNORMAL LOW (ref 135–145)

## 2017-03-12 LAB — FERRITIN: FERRITIN: 43 ng/mL (ref 11–307)

## 2017-03-12 NOTE — ED Triage Notes (Addendum)
Patient in today c/o productive cough (clear) and runny nose x 1 week. Patient left w/o seeing a provider.

## 2017-03-13 LAB — THYROID PANEL WITH TSH
FREE THYROXINE INDEX: 3.4 (ref 1.2–4.9)
T3 UPTAKE RATIO: 30 % (ref 24–39)
T4, Total: 11.3 ug/dL (ref 4.5–12.0)
TSH: 0.782 u[IU]/mL (ref 0.450–4.500)

## 2017-03-13 LAB — TGAB+THYROGLOBULIN IMA OR RIA: Thyroglobulin Antibody: <1 IU/mL (ref 0.0–0.9)

## 2017-03-13 LAB — THYROGLOBULIN BY IMA: Thyroglobulin by IMA: 0.1 ng/mL — ABNORMAL LOW (ref 1.5–38.5)

## 2017-03-14 NOTE — ED Provider Notes (Signed)
Patient left without being seen.  No charge.  [place], [place], [place] 03/14/17 [phone]

## 2017-03-19 ENCOUNTER — Other Ambulatory Visit: Payer: Self-pay

## 2017-03-19 ENCOUNTER — Encounter: Payer: Self-pay | Admitting: Internal Medicine

## 2017-03-19 ENCOUNTER — Inpatient Hospital Stay (HOSPITAL_BASED_OUTPATIENT_CLINIC_OR_DEPARTMENT_OTHER): Payer: Medicare Other | Admitting: Internal Medicine

## 2017-03-19 ENCOUNTER — Inpatient Hospital Stay: Payer: Medicare Other

## 2017-03-19 VITALS — BP 158/76 | HR 109 | Temp 97.8°F | Resp 20 | Ht 63.0 in | Wt 154.3 lb

## 2017-03-19 DIAGNOSIS — E871 Hypo-osmolality and hyponatremia: Secondary | ICD-10-CM

## 2017-03-19 DIAGNOSIS — Z85850 Personal history of malignant neoplasm of thyroid: Secondary | ICD-10-CM | POA: Diagnosis not present

## 2017-03-19 DIAGNOSIS — Z79899 Other long term (current) drug therapy: Secondary | ICD-10-CM

## 2017-03-19 DIAGNOSIS — D500 Iron deficiency anemia secondary to blood loss (chronic): Secondary | ICD-10-CM

## 2017-03-19 DIAGNOSIS — F17210 Nicotine dependence, cigarettes, uncomplicated: Secondary | ICD-10-CM | POA: Diagnosis not present

## 2017-03-19 DIAGNOSIS — J069 Acute upper respiratory infection, unspecified: Secondary | ICD-10-CM

## 2017-03-19 NOTE — Assessment & Plan Note (Addendum)
#   Iron deficiency anemia- unclear etiology/probable malabsorption. Responds well to IV iron.  hemoglobin is 13 however ferritin is 43; Iron sat- 10% /trending down.  However patient is asymptomatic.  Hold off IV iron today.  # # Thyroid cancer-status post RAIU [2009] clinically no recurrence of recurrence. On Synthroid.  Thyroglobulin levels normal.  We will again recheck in 12 months.  # Smoker/ lung cancer screening program- CT scan July 2018- NED.  # "Cold"- URI-slightly elevated white count/  symptomatic treatment- clairitin/ mucinex. ?   # Hyponatremia sodium 128-chronic/stable.  # NO IV iron today; recommend follow up in 6 months/ labs in 6 months; labs- few days prior.

## 2017-03-19 NOTE — Progress Notes (Signed)
[place] NOTE  Patient Care Team: Tracie Harrier, MD as PCP - General (Internal Medicine)  CHIEF COMPLAINTS/PURPOSE OF CONSULTATION: Anemia   # AUG 2015- IRON DEFICIENCY ANEMIA- [Oct 2016 hb~8.7;MCV-76 Ferritin- 13] s/p colo [2015] & EGD; April 2016- CT A/P- NEG; NOV 2016- Hb- 8.5/15-Ferritin-Ferrahem x2;  # Thyroid ca s/p S & RAIU [June 2009]  # Chronic tremors; Active smoker  HISTORY OF PRESENTING ILLNESS:  Martha [name] 71 y.o. pleasant female with iron deficiency anemia of unclear etiology [question malabsorption] is here for follow-up.  Patient felt improved energy after her IV iron appx 6 months ago. She is chronic shortness of breath/underlying COPD. Denies any blood in stools. She walks with a cane. Denies any bone pain.  The last few days she noted to have postnasal discharge/drip nasal congestion.  Mild cough.  No fevers or chills.  ROS: A complete 10 point review of system is done which is negative except mentioned above in history of present illness  MEDICAL HISTORY:  Past Medical History:  Diagnosis Date  . Abnormal LFTs (liver function tests)   . Adenomatous polyp of colon 2009  . Anxiety   . Arthritis   . Asthma   . Chronic bronchitis ([place])   . Chronic constipation   . Chronic low back pain   . Cigarette smoker    Has cut back Smoking to 1 pack every other day  . COPD (chronic obstructive pulmonary disease) ([place])   . Depression   . Diabetic neuropathy ([place])   . Diverticulitis 2015   gi recommended repeat scope in 10 years  . Essential hypertension   . Fatty liver   . GERD (gastroesophageal reflux disease)   . Gout   . History of mammogram 05/28/2013  . Hyperlipidemia   . Hypothyroidism   . Insomnia   . Iron deficiency anemia   . Low serum vitamin D   . Personal history of tobacco use, presenting hazards to health 09/29/2015  . Plantar fasciitis   . RLS (restless legs syndrome)   . Shortness of breath dyspnea   . Thyroid  cancer ([place]) 2009  . Tremors of nervous system    "I think I have parkinson's disease"  . Type 2 diabetes mellitus ([place])     SURGICAL HISTORY: Past Surgical History:  Procedure Laterality Date  . CATARACT EXTRACTION W/PHACO Left 11/24/2015   Procedure: CATARACT EXTRACTION PHACO AND INTRAOCULAR LENS PLACEMENT (IOC);  Surgeon: Birder Robson, MD;  Location: [facility] ORS;  Service: Ophthalmology;  Laterality: Left;  [place] 38.3 AP% 20.6 CDE 7.89 FLUID PACK LOT # [id number] H  . COLONOSCOPY  2004, 2009, 2015   [place]  . POLYPECTOMY  2009  . THYROIDECTOMY  2009   states she was tx with radiactive iodine  . TUBAL LIGATION    . UPPER GASTROINTESTINAL ENDOSCOPY      SOCIAL HISTORY: Social History   Socioeconomic History  . Marital status: Divorced    Spouse name: Not on file  . Number of children: Not on file  . Years of education: Not on file  . Highest education level: Not on file  Social Needs  . Financial resource strain: Not on file  . Food insecurity - worry: Not on file  . Food insecurity - inability: Not on file  . Transportation needs - medical: Not on file  . Transportation needs - non-medical: Not on file  Occupational History  . Not on file  Tobacco Use  . Smoking status: Current Every  Day Smoker    Packs/day: 0.50    Years: 40.00    Pack years: 20.00    Types: Cigarettes  . Smokeless tobacco: Never Used  . Tobacco comment: curently smokes 12 cigerattes a day. I'm trying to cut back.  Substance and Sexual Activity  . Alcohol use: No    Alcohol/week: 0.0 oz  . Drug use: No  . Sexual activity: No  Other Topics Concern  . Not on file  Social History Narrative  . Not on file    FAMILY HISTORY: Family History  Problem Relation Age of Onset  . Diabetes Mellitus II Father   . Lung disease Father   . Diabetes Mellitus II Mother   . Arthritis Mother   . Thyroid disease Unknown   . Asthma Sister   . Emphysema Sister   . Hypertension Sister     ALLERGIES:  is  allergic to nicotine polacrilex; nicotrol [nicotine]; and codeine sulfate.  MEDICATIONS:  Current Outpatient Medications  Medication Sig Dispense Refill  . alendronate (FOSAMAX) 70 MG tablet Take 1 tablet by mouth once a week.    Marland Kitchen aspirin EC 81 MG tablet Take 81 mg by mouth daily.     . cetirizine (ZYRTEC) 10 MG tablet Take 10 mg by mouth daily.     [name] Calcium (STOOL SOFTENER PO) Take 1 capsule by mouth daily.    . ferrous sulfate 325 (65 FE) MG EC tablet Take 325 mg by mouth daily.     Marland Kitchen gabapentin (NEURONTIN) 600 MG tablet Take 600 mg by mouth 3 (three) times daily.    Marland Kitchen glipiZIDE (GLUCOTROL) 10 MG tablet Take 10 mg by mouth 2 (two) times daily.     . hydrochlorothiazide (HYDRODIURIL) 25 MG tablet Take 25 mg by mouth at bedtime.     Marland Kitchen levothyroxine (SYNTHROID, LEVOTHROID) 150 MCG tablet Take 150 mcg by mouth daily before breakfast.     . metFORMIN (GLUCOPHAGE) 1000 MG tablet Take 1,000 mg by mouth 2 (two) times daily.     . primidone (MYSOLINE) 50 MG tablet 50 mg at bedtime.     . valsartan (DIOVAN) 160 MG tablet Take 160 mg by mouth daily.     . vitamin B-12 (CYANOCOBALAMIN) 1000 MCG tablet Take 1,000 mcg by mouth daily.    . vitamin C (ASCORBIC ACID) 500 MG tablet Take 500 mg by mouth daily.    . Vitamin D, Ergocalciferol, (DRISDOL) 50000 UNITS CAPS capsule Take 50,000 Units by mouth every 7 (seven) days. Tuesday    . PROAIR HFA 108 (90 BASE) MCG/ACT inhaler Inhale 2 Inhalers into the lungs every 6 (six) hours as needed for wheezing or shortness of breath.      No current facility-administered medications for this visit.       Marland Kitchen  PHYSICAL EXAMINATION:   Vitals:   03/19/17 1051  BP: (!) 158/76  Pulse: (!) 109  Resp: 20  Temp: 97.8 F (36.6 C)   Filed Weights   03/19/17 1051  Weight: 154 lb 5.2 oz (70 kg)    GENERAL: Well-nourished well-developed; Alert, no distress and comfortable. She walks with a cane.She has chronic tremors of her head. She is alone.   EYES: no pallor or icterus OROPHARYNX: no thrush or ulceration; poor dentition NECK: supple, no masses felt LYMPH: no palpable lymphadenopathy in the cervical, axillary or inguinal regions LUNGS: Decreased breath sounds bilaterally No wheeze or crackles HEART/CVS: regular rate & rhythm and no murmurs; No lower extremity edema ABDOMEN:  abdomen soft, non-tender and normal bowel sounds Musculoskeletal:no cyanosis of digits and no clubbing  PSYCH: alert & oriented x 3 with fluent speech NEURO: no focal motor/sensory deficits SKIN: no rashes or significant lesions   LABORATORY DATA:  I have reviewed the data as listed Lab Results  Component Value Date   WBC 13.9 (H) 03/12/2017   HGB 13.1 03/12/2017   HCT 39.1 03/12/2017   MCV 85.2 03/12/2017   PLT 407 03/12/2017   Recent Labs    09/11/16 1047 03/12/17 1009  NA 128* 128*  K 4.6 4.1  CL 94* 93*  CO2 24 25  GLUCOSE 153* 149*  BUN 24* 21*  CREATININE 1.10* 0.69  CALCIUM 9.5 8.9  GFRNONAA 49* >60  GFRAA 57* >60     ASSESSMENT & PLAN:   Iron deficiency anemia due to chronic blood loss # Iron deficiency anemia- unclear etiology/probable malabsorption. Responds well to IV iron.  hemoglobin is 13 however ferritin is 43; Iron sat- 10% /trending down.  However patient is asymptomatic.  Hold off IV iron today.  # # Thyroid cancer-status post RAIU [2009] clinically no recurrence of recurrence. On Synthroid.  Thyroglobulin levels normal.  We will again recheck in 12 months.  # Smoker/ lung cancer screening program- CT scan July 2018- NED.  # "Cold"- URI-slightly elevated white count/  symptomatic treatment- clairitin/ mucinex. ?   # Hyponatremia sodium 128-chronic/stable.  # NO IV iron today; recommend follow up in 6 months/ labs in 6 months; labs- few days prior.     Cammie Sickle, MD 03/21/2017 4:53 AM

## 2017-08-26 ENCOUNTER — Other Ambulatory Visit: Payer: Self-pay | Admitting: Internal Medicine

## 2017-08-26 DIAGNOSIS — Z1239 Encounter for other screening for malignant neoplasm of breast: Secondary | ICD-10-CM

## 2017-09-09 ENCOUNTER — Other Ambulatory Visit: Payer: Self-pay | Admitting: *Deleted

## 2017-09-09 DIAGNOSIS — D500 Iron deficiency anemia secondary to blood loss (chronic): Secondary | ICD-10-CM

## 2017-09-12 ENCOUNTER — Inpatient Hospital Stay: Payer: Medicare Other | Attending: Internal Medicine

## 2017-09-17 ENCOUNTER — Inpatient Hospital Stay: Payer: Medicare Other | Admitting: Internal Medicine

## 2017-09-17 ENCOUNTER — Inpatient Hospital Stay: Payer: Medicare Other | Attending: Internal Medicine

## 2017-09-17 ENCOUNTER — Inpatient Hospital Stay: Payer: Medicare Other

## 2017-09-17 DIAGNOSIS — I10 Essential (primary) hypertension: Secondary | ICD-10-CM | POA: Diagnosis not present

## 2017-09-17 DIAGNOSIS — J449 Chronic obstructive pulmonary disease, unspecified: Secondary | ICD-10-CM | POA: Insufficient documentation

## 2017-09-17 DIAGNOSIS — F17210 Nicotine dependence, cigarettes, uncomplicated: Secondary | ICD-10-CM | POA: Diagnosis not present

## 2017-09-17 DIAGNOSIS — D500 Iron deficiency anemia secondary to blood loss (chronic): Secondary | ICD-10-CM

## 2017-09-17 DIAGNOSIS — Z79899 Other long term (current) drug therapy: Secondary | ICD-10-CM | POA: Insufficient documentation

## 2017-09-17 DIAGNOSIS — Z85850 Personal history of malignant neoplasm of thyroid: Secondary | ICD-10-CM | POA: Diagnosis not present

## 2017-09-17 LAB — COMPREHENSIVE METABOLIC PANEL
ALT: 15 U/L (ref 14–54)
AST: 21 U/L (ref 15–41)
Albumin: 4 g/dL (ref 3.5–5.0)
Alkaline Phosphatase: 82 U/L (ref 38–126)
Anion gap: 14 (ref 5–15)
BUN: 11 mg/dL (ref 6–20)
CHLORIDE: 91 mmol/L — AB (ref 101–111)
CO2: 26 mmol/L (ref 22–32)
Calcium: 9.1 mg/dL (ref 8.9–10.3)
Creatinine, Ser: 0.74 mg/dL (ref 0.44–1.00)
GFR calc Af Amer: >60 mL/min (ref >60)
Glucose, Bld: 190 mg/dL — ABNORMAL HIGH (ref 65–99)
POTASSIUM: 3.7 mmol/L (ref 3.5–5.1)
SODIUM: 131 mmol/L — AB (ref 135–145)
Total Bilirubin: 0.3 mg/dL (ref 0.3–1.2)
Total Protein: 7.8 g/dL (ref 6.5–8.1)

## 2017-09-17 LAB — CBC WITH DIFFERENTIAL/PLATELET
Basophils Absolute: 0.1 10*3/uL (ref 0–0.1)
Basophils Relative: 1 %
EOS PCT: 2 %
Eosinophils Absolute: 0.2 10*3/uL (ref 0–0.7)
HCT: 40.1 % (ref 35.0–47.0)
Hemoglobin: 13.3 g/dL (ref 12.0–16.0)
LYMPHS ABS: 1.5 10*3/uL (ref 1.0–3.6)
Lymphocytes Relative: 12 %
MCH: 28.9 pg (ref 26.0–34.0)
MCHC: 33.2 g/dL (ref 32.0–36.0)
MCV: 87.2 fL (ref 80.0–100.0)
MONOS PCT: 6 %
Monocytes Absolute: 0.7 10*3/uL (ref 0.2–0.9)
Neutro Abs: 9.9 10*3/uL — ABNORMAL HIGH (ref 1.4–6.5)
Neutrophils Relative %: 79 %
PLATELETS: 281 10*3/uL (ref 150–440)
RBC: 4.6 MIL/uL (ref 3.80–5.20)
RDW: 16.5 % — ABNORMAL HIGH (ref 11.5–14.5)
WBC: 12.4 10*3/uL — AB (ref 3.6–11.0)

## 2017-09-17 LAB — FERRITIN: Ferritin: 14 ng/mL (ref 11–307)

## 2017-09-17 LAB — IRON AND TIBC
Iron: 92 ug/dL (ref 28–170)
Saturation Ratios: 23 % (ref 10.4–31.8)
TIBC: 401 ug/dL (ref 250–450)
UIBC: 309 ug/dL

## 2017-09-24 ENCOUNTER — Inpatient Hospital Stay (HOSPITAL_BASED_OUTPATIENT_CLINIC_OR_DEPARTMENT_OTHER): Payer: Medicare Other | Admitting: Internal Medicine

## 2017-09-24 ENCOUNTER — Encounter: Payer: Self-pay | Admitting: Internal Medicine

## 2017-09-24 ENCOUNTER — Inpatient Hospital Stay: Payer: Medicare Other

## 2017-09-24 ENCOUNTER — Other Ambulatory Visit: Payer: Self-pay

## 2017-09-24 VITALS — BP 151/67 | HR 112 | Temp 99.1°F | Resp 22 | Ht 63.0 in | Wt 161.8 lb

## 2017-09-24 DIAGNOSIS — Z79899 Other long term (current) drug therapy: Secondary | ICD-10-CM | POA: Diagnosis not present

## 2017-09-24 DIAGNOSIS — J449 Chronic obstructive pulmonary disease, unspecified: Secondary | ICD-10-CM | POA: Diagnosis not present

## 2017-09-24 DIAGNOSIS — Z85850 Personal history of malignant neoplasm of thyroid: Secondary | ICD-10-CM | POA: Diagnosis not present

## 2017-09-24 DIAGNOSIS — F17210 Nicotine dependence, cigarettes, uncomplicated: Secondary | ICD-10-CM | POA: Diagnosis not present

## 2017-09-24 DIAGNOSIS — D500 Iron deficiency anemia secondary to blood loss (chronic): Secondary | ICD-10-CM | POA: Diagnosis not present

## 2017-09-24 NOTE — Progress Notes (Signed)
[place] NOTE  Patient Care Team: Tracie Harrier, MD as PCP - General (Internal Medicine)  CHIEF COMPLAINTS/PURPOSE OF CONSULTATION: Anemia   # AUG 2015- IRON DEFICIENCY ANEMIA- [Oct 2016 hb~8.7;MCV-76 Ferritin- 13] s/p colo [2015] & EGD; April 2016- CT A/P- NEG; NOV 2016- Hb- 8.5/15-Ferritin-F[place]hem x2;  # Thyroid ca s/p S & RAIU [June 2009]   # Chronic tremors; Active smoker  HISTORY OF PRESENTING ILLNESS:  Martha [name] 72 y.o. pleasant female with iron deficiency anemia of unclear etiology [question malabsorption] is here for follow-up.  Patient denies any blood in stools.  She has chronic shortness of breath secondary to underlying COPD.  She denies any pleural bronchitis for which she was treated with antibiotics.  Unfortunately she continues to smoke.  Review of Systems  Constitutional: Negative for chills, diaphoresis, fever, malaise/fatigue and weight loss.  HENT: Negative for nosebleeds and sore throat.   Eyes: Negative for double vision.  Respiratory: Positive for cough, sputum production and shortness of breath. Negative for hemoptysis.   Cardiovascular: Negative for chest pain, palpitations, orthopnea and leg swelling.  Gastrointestinal: Negative for abdominal pain, blood in stool, constipation, diarrhea, heartburn, melena, nausea and vomiting.  Genitourinary: Negative for dysuria, frequency and urgency.  Musculoskeletal: Negative for back pain and joint pain.  Skin: Negative.  Negative for itching and rash.  Neurological: Negative for dizziness, tingling, focal weakness, weakness and headaches.  Endo/Heme/Allergies: Does not bruise/bleed easily.  Psychiatric/Behavioral: Negative for depression. The patient is not nervous/anxious and does not have insomnia.      MEDICAL HISTORY:  Past Medical History:  Diagnosis Date  . Abnormal LFTs (liver function tests)   . Adenomatous polyp of colon 2009  . Anxiety   . Arthritis   . Asthma   .  Chronic bronchitis ([place])   . Chronic constipation   . Chronic low back pain   . Cigarette smoker    Has cut back Smoking to 1 pack every other day  . COPD (chronic obstructive pulmonary disease) ([place])   . Depression   . Diabetic neuropathy ([place])   . Diverticulitis 2015   gi recommended repeat scope in 10 years  . Essential hypertension   . Fatty liver   . GERD (gastroesophageal reflux disease)   . Gout   . History of mammogram 05/28/2013  . Hyperlipidemia   . Hypothyroidism   . Insomnia   . Iron deficiency anemia   . Low serum vitamin D   . Personal history of tobacco use, presenting hazards to health 09/29/2015  . Plantar fasciitis   . RLS (restless legs syndrome)   . Shortness of breath dyspnea   . Thyroid cancer ([place]) 2009  . Tremors of nervous system    "I think I have parkinson's disease"  . Type 2 diabetes mellitus ([place])     SURGICAL HISTORY: Past Surgical History:  Procedure Laterality Date  . CATARACT EXTRACTION W/PHACO Left 11/24/2015   Procedure: CATARACT EXTRACTION PHACO AND INTRAOCULAR LENS PLACEMENT (IOC);  Surgeon: Birder Robson, MD;  Location: [facility] ORS;  Service: Ophthalmology;  Laterality: Left;  [place] 38.3 AP% 20.6 CDE 7.89 FLUID PACK LOT # [id number] H  . COLONOSCOPY  2004, 2009, 2015   Adenoma  . POLYPECTOMY  2009  . THYROIDECTOMY  2009   states she was tx with radiactive iodine  . TUBAL LIGATION    . UPPER GASTROINTESTINAL ENDOSCOPY      SOCIAL HISTORY: Social History   Socioeconomic History  . Marital status: Divorced  Spouse name: Not on file  . Number of children: Not on file  . Years of education: Not on file  . Highest education level: Not on file  Occupational History  . Not on file  Social Needs  . Financial resource strain: Not on file  . Food insecurity:    Worry: Not on file    Inability: Not on file  . Transportation needs:    Medical: Not on file    Non-medical: Not on file  Tobacco Use  . Smoking status: Current Every Day  Smoker    Packs/day: 0.50    Years: 40.00    Pack years: 20.00    Types: Cigarettes  . Smokeless tobacco: Never Used  . Tobacco comment: curently smokes 12 cigerattes a day. I'm trying to cut back.  Substance and Sexual Activity  . Alcohol use: No    Alcohol/week: 0.0 oz  . Drug use: No  . Sexual activity: Never  Lifestyle  . Physical activity:    Days per week: Not on file    Minutes per session: Not on file  . Stress: Not on file  Relationships  . Social connections:    Talks on phone: Not on file    Gets together: Not on file    Attends religious service: Not on file    Active member of club or organization: Not on file    Attends meetings of clubs or organizations: Not on file    Relationship status: Not on file  . Intimate partner violence:    Fear of current or ex partner: Not on file    Emotionally abused: Not on file    Physically abused: Not on file    Forced sexual activity: Not on file  Other Topics Concern  . Not on file  Social History Narrative  . Not on file    FAMILY HISTORY: Family History  Problem Relation Age of Onset  . Diabetes Mellitus II Father   . Lung disease Father   . Diabetes Mellitus II Mother   . Arthritis Mother   . Thyroid disease Unknown   . Asthma Sister   . Emphysema Sister   . Hypertension Sister     ALLERGIES:  is allergic to nicotine polacrilex; nicotrol [nicotine]; and codeine sulfate.  MEDICATIONS:  Current Outpatient Medications  Medication Sig Dispense Refill  . aspirin EC 81 MG tablet Take 81 mg by mouth daily.     . cetirizine (ZYRTEC) 10 MG tablet Take 10 mg by mouth daily.     [name] Calcium (STOOL SOFTENER PO) Take 1 capsule by mouth daily.    . ferrous sulfate 325 (65 FE) MG EC tablet Take 325 mg by mouth daily.     Marland Kitchen gabapentin (NEURONTIN) 600 MG tablet Take 600 mg by mouth 3 (three) times daily.    Marland Kitchen glipiZIDE (GLUCOTROL) 10 MG tablet Take 10 mg by mouth 2 (two) times daily.     . hydrochlorothiazide  (HYDRODIURIL) 25 MG tablet Take 25 mg by mouth at bedtime.     Marland Kitchen levothyroxine (SYNTHROID, LEVOTHROID) 150 MCG tablet Take 150 mcg by mouth daily before breakfast.     . metFORMIN (GLUCOPHAGE) 1000 MG tablet Take 1,000 mg by mouth 2 (two) times daily.     . primidone (MYSOLINE) 50 MG tablet 50 mg at bedtime.     Marland Kitchen PROAIR HFA 108 (90 BASE) MCG/ACT inhaler Inhale 2 Inhalers into the lungs every 6 (six) hours as needed for wheezing or  shortness of breath.     . valsartan (DIOVAN) 160 MG tablet Take 160 mg by mouth daily.     . vitamin B-12 (CYANOCOBALAMIN) 1000 MCG tablet Take 1,000 mcg by mouth daily.    . vitamin C (ASCORBIC ACID) 500 MG tablet Take 500 mg by mouth daily.    . Vitamin D, Ergocalciferol, (DRISDOL) 50000 UNITS CAPS capsule Take 50,000 Units by mouth every 7 (seven) days. Tuesday     No current facility-administered medications for this visit.       Marland Kitchen  PHYSICAL EXAMINATION:   Vitals:   09/24/17 1430  BP: (!) 151/67  Pulse: (!) 112  Resp: (!) 22  Temp: 99.1 F (37.3 C)   Filed Weights   09/24/17 1440  Weight: 161 lb 13.1 oz (73.4 kg)    GENERAL: Well-nourished well-developed; Alert, no distress and comfortable.  She is alone.  She walks with a cane.  She has chronic tremors of the hand and head. EYES: no pallor or icterus OROPHARYNX: no thrush or ulceration; poor dentition.  NECK: supple; no lymph nodes felt. LYMPH:  no palpable lymphadenopathy in the axillary or inguinal regions LUNGS: Decreased breath sounds auscultation bilaterally. No wheeze or crackles HEART/CVS: regular rate & rhythm and no murmurs; No lower extremity edema ABDOMEN:abdomen soft, non-tender and normal bowel sounds. No hepatomegaly or splenomegaly.  Musculoskeletal:no cyanosis of digits and no clubbing  PSYCH: alert & oriented x 3 with fluent speech NEURO: no focal motor/sensory deficits SKIN:  no rashes or significant lesions  LABORATORY DATA:  I have reviewed the data as listed Lab  Results  Component Value Date   WBC 12.4 (H) 09/17/2017   HGB 13.3 09/17/2017   HCT 40.1 09/17/2017   MCV 87.2 09/17/2017   PLT 281 09/17/2017   Recent Labs    03/12/17 1009 09/17/17 1016  NA 128* 131*  K 4.1 3.7  CL 93* 91*  CO2 25 26  GLUCOSE 149* 190*  BUN 21* 11  CREATININE 0.69 0.74  CALCIUM 8.9 9.1  GFRNONAA >60 >60  GFRAA >60 >60  PROT  --  7.8  ALBUMIN  --  4.0  AST  --  21  ALT  --  15  ALKPHOS  --  82  BILITOT  --  0.3     ASSESSMENT & PLAN:   Iron deficiency anemia due to chronic blood loss #Iron deficient anemia unclear etiology question malabsorption-responded well to IV iron.;  Today hemoglobin 13 saturation-23%; ferritin 14.  Patient asymptomatic.  Hold IV iron.  Continue p.o. Iron.  #Thyroid cancer; there is post radioactive iodine 2009; clinically no evidence of recurrence.  Continue Synthroid.  Follow-up tumor markers in 6 months  # Smoker/ lung cancer screening program- CT scan July 2018- NED.  Again discussed some of smoking cessation patient not interested.  #Recent bronchitis-second smoking; patient thinks otherwise.  Currently resolved.  # Hyponatremia sodium-131 chronic; improved.   # NO IV iron today; recommend follow up in 6 months/ labs in 6 months; labs- few days prior.     Cammie Sickle, MD 09/24/2017 3:44 PM

## 2017-09-24 NOTE — Assessment & Plan Note (Addendum)
#  Iron deficient anemia unclear etiology question malabsorption-responded well to IV iron.;  Today hemoglobin 13 saturation-23%; ferritin 14.  Patient asymptomatic.  Hold IV iron.  Continue p.o. Iron.  #Thyroid cancer; there is post radioactive iodine 2009; clinically no evidence of recurrence.  Continue Synthroid.  Follow-up tumor markers in 6 months  # Smoker/ lung cancer screening program- CT scan July 2018- NED.  Again discussed some of smoking cessation patient not interested.  #Recent bronchitis-second smoking; patient thinks otherwise.  Currently resolved.  # Hyponatremia sodium-131 chronic; improved.   # NO IV iron today; recommend follow up in 6 months/ labs in 6 months; labs- few days prior.

## 2017-10-01 ENCOUNTER — Other Ambulatory Visit: Payer: Self-pay | Admitting: Internal Medicine

## 2017-10-01 DIAGNOSIS — Z1231 Encounter for screening mammogram for malignant neoplasm of breast: Secondary | ICD-10-CM

## 2017-10-18 ENCOUNTER — Ambulatory Visit
Admission: RE | Admit: 2017-10-18 | Discharge: 2017-10-18 | Disposition: A | Discharge status: Home / Self Care | Payer: Medicare Other | Source: Ambulatory Visit | Attending: Internal Medicine | Admitting: Internal Medicine

## 2017-10-18 DIAGNOSIS — Z1231 Encounter for screening mammogram for malignant neoplasm of breast: Secondary | ICD-10-CM

## 2017-11-14 ENCOUNTER — Telehealth: Payer: Self-pay | Admitting: Nurse Practitioner

## 2017-11-19 ENCOUNTER — Telehealth: Payer: Self-pay | Admitting: *Deleted

## 2017-11-19 DIAGNOSIS — Z122 Encounter for screening for malignant neoplasm of respiratory organs: Secondary | ICD-10-CM

## 2017-11-19 DIAGNOSIS — Z87891 Personal history of nicotine dependence: Secondary | ICD-10-CM

## 2017-11-19 NOTE — Telephone Encounter (Signed)
Notified patient that annual lung cancer screening low dose CT scan is due currently or will be in near future. Confirmed that patient is within the age range of 55-77, and asymptomatic, (no signs or symptoms of lung cancer). Patient denies illness that would prevent curative treatment for lung cancer if found. Verified smoking history, (current, 39 pack year). The shared decision making visit was done 09/30/15. Patient is agreeable for CT scan being scheduled.

## 2017-11-26 ENCOUNTER — Ambulatory Visit: Admission: RE | Admit: 2017-11-26 | Payer: Medicare Other | Source: Ambulatory Visit

## 2017-12-05 ENCOUNTER — Telehealth: Payer: Self-pay | Admitting: *Deleted

## 2017-12-05 NOTE — Telephone Encounter (Signed)
Patient called r/t CT screening of lung due at this time.  Patient voiced understanding and agreement with CT screening.  Appointment sent to scheduling for future appointment.    

## 2017-12-13 ENCOUNTER — Ambulatory Visit
Admission: RE | Admit: 2017-12-13 | Discharge: 2017-12-13 | Disposition: A | Discharge status: Home / Self Care | Payer: Medicare Other | Source: Ambulatory Visit | Attending: Nurse Practitioner | Admitting: Nurse Practitioner

## 2017-12-13 ENCOUNTER — Ambulatory Visit: Payer: Medicare Other

## 2017-12-13 DIAGNOSIS — Z122 Encounter for screening for malignant neoplasm of respiratory organs: Secondary | ICD-10-CM | POA: Insufficient documentation

## 2017-12-13 DIAGNOSIS — I700 Atherosclerosis of aorta: Secondary | ICD-10-CM | POA: Insufficient documentation

## 2017-12-13 DIAGNOSIS — Z87891 Personal history of nicotine dependence: Secondary | ICD-10-CM | POA: Diagnosis present

## 2017-12-13 DIAGNOSIS — J439 Emphysema, unspecified: Secondary | ICD-10-CM | POA: Insufficient documentation

## 2017-12-16 ENCOUNTER — Encounter: Payer: Self-pay | Admitting: *Deleted

## 2017-12-16 ENCOUNTER — Telehealth: Payer: Self-pay | Admitting: *Deleted

## 2017-12-16 NOTE — Telephone Encounter (Signed)
Notified patient of LDCT lung cancer screening program results with recommendation for 12 month follow up imaging.  Also notified of incidental findings noted below and is encouraged to discuss further questions with PCP who will receive a copy of this not and/or the CT reports.  Patient verbalized understanding.    IMPRESSION: Lung-RADS 2, benign appearance or behavior. Continue annual screening with low-dose chest CT without contrast in 12 months.  Aortic Atherosclerosis (ICD10-I70.0) and Emphysema (ICD10-J43.9).

## 2017-12-17 ENCOUNTER — Encounter: Payer: Self-pay | Admitting: *Deleted

## 2018-01-04 ENCOUNTER — Encounter: Payer: Self-pay | Admitting: Gynecology

## 2018-01-04 ENCOUNTER — Ambulatory Visit (INDEPENDENT_AMBULATORY_CARE_PROVIDER_SITE_OTHER): Payer: Medicare Other

## 2018-01-04 ENCOUNTER — Other Ambulatory Visit: Payer: Self-pay

## 2018-01-04 ENCOUNTER — Ambulatory Visit
Admission: EM | Admit: 2018-01-04 | Discharge: 2018-01-04 | Disposition: A | Discharge status: Home / Self Care | Payer: Medicare Other | Attending: Family Medicine | Admitting: Family Medicine

## 2018-01-04 DIAGNOSIS — S20212A Contusion of left front wall of thorax, initial encounter: Secondary | ICD-10-CM

## 2018-01-04 DIAGNOSIS — R0789 Other chest pain: Secondary | ICD-10-CM

## 2018-01-04 DIAGNOSIS — W19XXXA Unspecified fall, initial encounter: Secondary | ICD-10-CM | POA: Diagnosis not present

## 2018-01-04 NOTE — ED Provider Notes (Signed)
[facility]    CSN: [id number] Arrival date & time: 01/04/18  1201     History   Chief Complaint Chief Complaint  Patient presents with  . Fall    HPI Martha [name] is a 72 y.o. female.   HPI  72 year old female with multiple comorbidities that she fell 3 days ago in her bathroom when she lost her balance and fell against the door frame.  Daughter who accompanies her dates that because of increasing pain particularly last night they decided to come in.  Her daughter states that the patient has tremors and fell forward while trying to pick up a piece of paper.  The patient states that she did not hit her head did not lose consciousness.  History of COPD and used to smoke.  In addition she has diabetes mellitus type 2 is post cancer of the thyroid gland hypertension.  When she first presented to our office her pulse rate was 123 blood pressure was 156/130 but she had walked in from the parking lot.  Repeat pressure was 156/64 and pulse rate of 94.  She does not have respiratory distress.  Normal bowel movement last night.  Does not have any abdominal pain.  She has not had any hemoptysis.  Pain is localized over the anterior ribs on the left in the anterior axillary line ribs 8 area.  Is a very minimal air movement on auscultation.  There is no crepitus there is no crackles or chai.            Past Medical History:  Diagnosis Date  . Abnormal LFTs (liver function tests)   . Anxiety   . Arthritis   . Asthma   . Chronic bronchitis ([place])   . Chronic constipation   . Chronic low back pain   . Cigarette smoker    Has cut back Smoking to 1 pack every other day  . COPD (chronic obstructive pulmonary disease) ([place])   . Depression   . Diabetic neuropathy ([place])   . Diverticulitis 2015   gi recommended repeat scope in 10 years  . Essential hypertension   . Fatty liver   . GERD (gastroesophageal reflux disease)   . Gout   . History of mammogram 05/28/2013  .  Hyperlipidemia   . Hypothyroidism   . Insomnia   . Iron deficiency anemia   . Low serum vitamin D   . Personal history of tobacco use, presenting hazards to health 09/29/2015  . Plantar fasciitis   . RLS (restless legs syndrome)   . Shortness of breath dyspnea   . Thyroid cancer ([place]) 2009  . Tremors of nervous system    "I think I have parkinson's disease"  . Type 2 diabetes mellitus [facility])     Patient Active Problem List   Diagnosis Date Noted  . Personal history of tobacco use, presenting hazards to health 09/29/2015  . Chronic obstructive pulmonary disease ([place]) 03/08/2015  . Type 2 diabetes mellitus ([place]) 03/08/2015  . BP (high blood pressure) 03/08/2015  . Malignant neoplasm of thyroid gland ([place]) 03/08/2015  . Has a tremor 03/08/2015  . Iron deficiency anemia due to chronic blood loss 03/08/2015  . Chronic anemia 08/24/2014  . Current tobacco use 04/27/2014    Past Surgical History:  Procedure Laterality Date  . CATARACT EXTRACTION W/PHACO Left 11/24/2015   Procedure: CATARACT EXTRACTION PHACO AND INTRAOCULAR LENS PLACEMENT (IOC);  Surgeon: Birder Robson, MD;  Location: [facility] ORS;  Service: Ophthalmology;  Laterality: Left;  [place] 38.3 AP% 20.6 CDE 7.89 FLUID PACK LOT # [id number] H  . COLONOSCOPY  2004, 2009, 2015   [place]  . POLYPECTOMY  2009  . THYROIDECTOMY  2009   states she was tx with radiactive iodine  . TUBAL LIGATION    . UPPER GASTROINTESTINAL ENDOSCOPY      OB History   None      Home Medications    Prior to Admission medications   Medication Sig Start Date End Date Taking? Authorizing Provider  alendronate (FOSAMAX) 70 MG tablet TAKE ONE TABLET BY MOUTH EACH WEEK, ON AN EMPTY STOMACH BEFORE BREAKFAST WITH 8oz OF WATER AND REMAIN UPRIGHT FOR :30 09/17/17  Yes [provider]  aspirin EC 81 MG tablet Take 81 mg by mouth daily.    Yes [provider]  cetirizine (ZYRTEC) 10 MG tablet Take 10 mg by mouth daily.    Yes [provider]  colchicine 0.6 MG tablet 1 tab po bid prn 06/28/15  Yes [provider]  Docusate Calcium (STOOL SOFTENER PO) Take 1 capsule by mouth daily.   Yes [provider]  ferrous sulfate 325 (65 FE) MG EC tablet Take 325 mg by mouth daily.  04/27/14 03/20/67 Yes [provider]  gabapentin (NEURONTIN) 600 MG tablet Take 600 mg by mouth 3 (three) times daily.   Yes [provider]  glipiZIDE (GLUCOTROL) 10 MG tablet Take 10 mg by mouth 2 (two) times daily.  12/10/14  Yes [provider]  hydrochlorothiazide (HYDRODIURIL) 25 MG tablet Take 25 mg by mouth at bedtime.  02/25/15  Yes [provider]  levothyroxine (SYNTHROID, LEVOTHROID) 150 MCG tablet Take 150 mcg by mouth daily before breakfast.  02/25/15  Yes [provider]  metFORMIN (GLUCOPHAGE) 1000 MG tablet Take 1,000 mg by mouth 2 (two) times daily.  12/10/14  Yes [provider]  primidone (MYSOLINE) 50 MG tablet 50 mg at bedtime.  08/22/16  Yes [provider]  PROAIR HFA 108 (90 BASE) MCG/ACT inhaler Inhale 2 Inhalers into the lungs every 6 (six) hours as needed for wheezing or shortness of breath.  12/03/14  Yes [provider]  tiZANidine (ZANAFLEX) 2 MG tablet TAKE 1 TABLET BY MOUTH 3 TIMES DAILY AS NEEDED FOR MUSCLE SPASMS 12/11/17  Yes [provider]  vitamin B-12 (CYANOCOBALAMIN) 1000 MCG tablet Take 1,000 mcg by mouth daily.   Yes [provider]  vitamin C (ASCORBIC ACID) 500 MG tablet Take 500 mg by mouth daily.   Yes [provider]  Vitamin D, Ergocalciferol, (DRISDOL) 50000 UNITS CAPS capsule Take 50,000 Units by mouth every 7 (seven) days. Tuesday 01/04/15  Yes [provider]  valsartan (DIOVAN) 160 MG tablet Take 160 mg by mouth daily.  12/15/14   [provider]    Family History Family History  Problem Relation Age of Onset  . Diabetes Mellitus II Father   . Lung disease Father   .  Diabetes Mellitus II Mother   . Arthritis Mother   . Thyroid disease Unknown   . Asthma Sister   . Emphysema Sister   . Hypertension Sister   . Breast cancer Paternal Aunt     Social History Social History   Tobacco Use  . Smoking status: Current Every Day Smoker    Packs/day: 0.50    Years: 40.00    Pack years: 20.00    Types: Cigarettes  . Smokeless tobacco: Never Used  . Tobacco comment: curently smokes 12  cigerattes a day. I'm trying to cut back.  Substance Use Topics  . Alcohol use: No    Alcohol/week: 0.0 standard drinks  . Drug use: No     Allergies   Nicotine polacrilex; Nicotrol [nicotine]; and Codeine sulfate   Review of Systems Review of Systems  Constitutional: Positive for activity change. Negative for appetite change, chills, fatigue and fever.  Cardiovascular: Positive for chest pain.  All other systems reviewed and are negative.    Physical Exam Triage Vital Signs ED Triage Vitals  Enc Vitals Group     BP 01/04/18 1211 (!) 156/130     Pulse Rate 01/04/18 1211 (!) 123     Resp 01/04/18 1211 16     Temp 01/04/18 1219 97.6 F (36.4 C)     Temp Source 01/04/18 1211 Oral     SpO2 01/04/18 1211 93 %     Weight 01/04/18 1213 157 lb (71.2 kg)     Height 01/04/18 1213 5\' 3"  (1.6 m)     Head Circumference --      Peak Flow --      Pain Score 01/04/18 1213 5     Pain Loc --      Pain Edu? --      Excl. in [place]? --    No data found.  Updated Vital Signs BP (!) 156/64 (BP Location: Right Arm)   Pulse 94   Temp 97.6 F (36.4 C) (Oral)   Resp 16   Ht 5\' 3"  (1.6 m)   Wt 157 lb (71.2 kg)   SpO2 93%   BMI 27.81 kg/m   Visual Acuity Right Eye Distance:   Left Eye Distance:   Bilateral Distance:    Right Eye Near:   Left Eye Near:    Bilateral Near:     Physical Exam  Constitutional: She is oriented to person, place, and time. She appears well-developed and well-nourished. No distress.  HENT:  Head: Normocephalic.  Eyes: Pupils are  equal, round, and reactive to light. Right eye exhibits no discharge. Left eye exhibits no discharge.  Neck: Normal range of motion.  Pulmonary/Chest: Effort normal.  Has decreased air movement and decreased breath sounds throughout.  No crackles or rhonchi or wheezing present.  Tenderness of the lateral ribs anteriorly in line of the anterior axillary line 7 8 level.  This reproduces her symptoms.  Musculoskeletal: She exhibits tenderness.  Neurological: She is alert and oriented to person, place, and time.  Skin: Skin is warm and dry. She is not diaphoretic.  Psychiatric: She has a normal mood and affect. Her behavior is normal. Judgment and thought content normal.  Nursing note and vitals reviewed.    UC Treatments / Results  Labs (all labs ordered are listed, but only abnormal results are displayed) Labs Reviewed - No data to display  EKG None  Radiology Dg Ribs Unilateral W/chest Left  Result Date: 01/04/2018 CLINICAL DATA:  Pain after fall 2 days ago. EXAM: LEFT RIBS AND CHEST - 3+ VIEW COMPARISON:  CT scan December 13, 2017 FINDINGS: Mild opacity in left base is stable, consistent with atelectasis or scar. No pneumothorax. A nodular density projected over the right first costochondral junction is likely focal degenerative change in this region. No pulmonary nodule seen in this region on the comparison CT scan from December 13, 2017. No new nodules. No masses. IMPRESSION: No rib fractures identified.  Atelectasis or scar in the left base. Electronically Signed   By: Dorise Bullion  III M.D   On: 01/04/2018 13:27    Procedures Procedures (including critical care time)  Medications Ordered in UC Medications - No data to display  Initial Impression / Assessment and Plan / UC Course  I have reviewed the triage vital signs and the nursing notes.  Pertinent labs & imaging results that were available during my care of the patient were reviewed by me and considered in my medical decision  making (see chart for details).     Use a Tylenol 500 mg and ibuprofen 200 mg every 6 hours as necessary for pain.  Incentive spirometer which you have at home 10 puffs every hour while awake.  For your rib cage with your opposite hand to help reduce pain with movement.  Follow-up with your primary care physician if you not improving Final Clinical Impressions(s) / UC Diagnoses   Final diagnoses:  Rib contusion, left, initial encounter     Discharge Instructions     Use Tylenol 500 mg and ibuprofen 200 mg every 6 hours as necessary for pain.  Use incentive spirometer 10 puffs every hour while awake.  I holding the area of pain with your other hand to help brace your ribs to not have as much pain.    ED Prescriptions    None     Controlled Substance Prescriptions [place] Controlled Substance Registry consulted? Not Applicable   Lorin Picket, PA-C 01/04/18 1355

## 2018-01-04 NOTE — Discharge Instructions (Signed)
Use Tylenol 500 mg and ibuprofen 200 mg every 6 hours as necessary for pain.  Use incentive spirometer 10 puffs every hour while awake.  I holding the area of pain with your other hand to help brace your ribs to not have as much pain.

## 2018-01-04 NOTE — ED Triage Notes (Addendum)
Patient c/o fell x 3 days ago at home in her bathroom, Per daughter mother has tremors and fell forward while picking up paper in bathroom. Daughter stated mother hit her left side on bathroom door. Per patient has not taken her medications for this am.

## 2018-03-14 ENCOUNTER — Ambulatory Visit
Admission: EM | Admit: 2018-03-14 | Discharge: 2018-03-14 | Disposition: A | Discharge status: Home / Self Care | Payer: Medicare Other | Attending: Family Medicine | Admitting: Family Medicine

## 2018-03-14 DIAGNOSIS — M545 Low back pain, unspecified: Secondary | ICD-10-CM

## 2018-03-14 DIAGNOSIS — M25552 Pain in left hip: Secondary | ICD-10-CM | POA: Diagnosis not present

## 2018-03-14 MED ORDER — PREDNISONE 10 MG (21) PO TBPK: ORAL_TABLET | ORAL | 0 refills | Status: DC

## 2018-03-14 NOTE — Discharge Instructions (Signed)
Rest.  Prednisone as prescribed.  Follow up with your primary.  Take care  Dr. Lacinda Axon

## 2018-03-14 NOTE — ED Triage Notes (Signed)
Pt reports left hip pain ongoing over last 3-4 days. Pt denies known injury. Reports awoke this morning with nausea. While obtaining vitals pt initial O2 84%. Pt denies using O2 at home reports intermittent SOB at baseline states " Mostly when walking" Pt denies CP

## 2018-03-14 NOTE — ED Provider Notes (Signed)
[facility]    CSN: [id number] Arrival date & time: 03/14/18  1029  History   Chief Complaint Chief Complaint  Patient presents with  . Hip Pain   HPI  72 year old female presents with the above complaint.  Patient reports a 3 to 4-day history of "left hip pain".  When asked to localize where the pain is, she points to her left low back.  Patient states that she feels like she "has a pinched nerve".  Worse with activity.  No relieving factors.  No medications or interventions tried.  No fall, trauma, injury.  No radicular symptoms.  No other complaints.  PMH, Surgical Hx, Family Hx, Social History reviewed and updated as below.  Past Medical History:  Diagnosis Date  . Abnormal LFTs (liver function tests)   . Anxiety   . Arthritis   . Asthma   . Chronic bronchitis ([place])   . Chronic constipation   . Chronic low back pain   . Cigarette smoker    Has cut back Smoking to 1 pack every other day  . COPD (chronic obstructive pulmonary disease) ([place])   . Depression   . Diabetic neuropathy ([place])   . Diverticulitis 2015   gi recommended repeat scope in 10 years  . Essential hypertension   . Fatty liver   . GERD (gastroesophageal reflux disease)   . Gout   . History of mammogram 05/28/2013  . Hyperlipidemia   . Hypothyroidism   . Insomnia   . Iron deficiency anemia   . Low serum vitamin D   . Personal history of tobacco use, presenting hazards to health 09/29/2015  . Plantar fasciitis   . RLS (restless legs syndrome)   . Shortness of breath dyspnea   . Thyroid cancer ([place]) 2009  . Tremors of nervous system    "I think I have parkinson's disease"  . Type 2 diabetes mellitus [facility])     Patient Active Problem List   Diagnosis Date Noted  . Personal history of tobacco use, presenting hazards to health 09/29/2015  . Chronic obstructive pulmonary disease ([place]) 03/08/2015  . Type 2 diabetes mellitus ([place]) 03/08/2015  . BP (high blood pressure) 03/08/2015  .  Malignant neoplasm of thyroid gland ([place]) 03/08/2015  . Has a tremor 03/08/2015  . Iron deficiency anemia due to chronic blood loss 03/08/2015  . Chronic anemia 08/24/2014  . Current tobacco use 04/27/2014    Past Surgical History:  Procedure Laterality Date  . CATARACT EXTRACTION W/PHACO Left 11/24/2015   Procedure: CATARACT EXTRACTION PHACO AND INTRAOCULAR LENS PLACEMENT (IOC);  Surgeon: Birder Robson, MD;  Location: [facility] ORS;  Service: Ophthalmology;  Laterality: Left;  [place] 38.3 AP% 20.6 CDE 7.89 FLUID PACK LOT # [id number] H  . COLONOSCOPY  2004, 2009, 2015   [place]  . POLYPECTOMY  2009  . THYROIDECTOMY  2009   states she was tx with radiactive iodine  . TUBAL LIGATION    . UPPER GASTROINTESTINAL ENDOSCOPY      OB History   None      Home Medications    Prior to Admission medications   Medication Sig Start Date End Date Taking? Authorizing Provider  sertraline (ZOLOFT) 100 MG tablet Take 100 mg by mouth daily.   Yes [provider]  alendronate (FOSAMAX) 70 MG tablet TAKE ONE TABLET BY MOUTH EACH WEEK, ON AN EMPTY STOMACH BEFORE BREAKFAST WITH 8oz OF WATER AND REMAIN UPRIGHT FOR :30 09/17/17   [provider]  aspirin EC 81 MG  tablet Take 81 mg by mouth daily.     [provider]  cetirizine (ZYRTEC) 10 MG tablet Take 10 mg by mouth daily.     [provider]  colchicine 0.6 MG tablet 1 tab po bid prn 06/28/15   [provider]  Docusate Calcium (STOOL SOFTENER PO) Take 1 capsule by mouth daily.    [provider]  ferrous sulfate 325 (65 FE) MG EC tablet Take 325 mg by mouth daily.  04/27/14 03/20/67  [provider]  gabapentin (NEURONTIN) 600 MG tablet Take 600 mg by mouth 3 (three) times daily.    [provider]  glipiZIDE (GLUCOTROL) 10 MG tablet Take 10 mg by mouth 2 (two) times daily.  12/10/14   [provider]  hydrochlorothiazide (HYDRODIURIL) 25 MG tablet Take 25 mg by mouth at  bedtime.  02/25/15   [provider]  levothyroxine (SYNTHROID, LEVOTHROID) 150 MCG tablet Take 150 mcg by mouth daily before breakfast.  02/25/15   [provider]  metFORMIN (GLUCOPHAGE) 1000 MG tablet Take 1,000 mg by mouth 2 (two) times daily.  12/10/14   [provider]  predniSONE (STERAPRED UNI-PAK 21 TAB) 10 MG (21) TBPK tablet 6 tablets on day 1; decrease by 1 tablet daily until gone. 03/14/18   Coral Spikes, DO  primidone (MYSOLINE) 50 MG tablet 50 mg at bedtime.  08/22/16   [provider]  PROAIR HFA 108 (90 BASE) MCG/ACT inhaler Inhale 2 Inhalers into the lungs every 6 (six) hours as needed for wheezing or shortness of breath.  12/03/14   [provider]  tiZANidine (ZANAFLEX) 2 MG tablet TAKE 1 TABLET BY MOUTH 3 TIMES DAILY AS NEEDED FOR MUSCLE SPASMS 12/11/17   [provider]  valsartan (DIOVAN) 160 MG tablet Take 160 mg by mouth daily.  12/15/14   [provider]  vitamin B-12 (CYANOCOBALAMIN) 1000 MCG tablet Take 1,000 mcg by mouth daily.    [provider]  vitamin C (ASCORBIC ACID) 500 MG tablet Take 500 mg by mouth daily.    [provider]  Vitamin D, Ergocalciferol, (DRISDOL) 50000 UNITS CAPS capsule Take 50,000 Units by mouth every 7 (seven) days. Tuesday 01/04/15   [provider]    Family History Family History  Problem Relation Age of Onset  . Diabetes Mellitus II Father   . Lung disease Father   . Diabetes Mellitus II Mother   . Arthritis Mother   . Thyroid disease Unknown   . Asthma Sister   . Emphysema Sister   . Hypertension Sister   . Breast cancer Paternal Aunt     Social History Social History   Tobacco Use  . Smoking status: Current Every Day Smoker    Packs/day: 0.50    Years: 40.00    Pack years: 20.00    Types: Cigarettes  . Smokeless tobacco: Never Used  . Tobacco comment: curently smokes 12 cigerattes a day. I'm trying to cut back.  Substance Use Topics  .  Alcohol use: No    Alcohol/week: 0.0 standard drinks  . Drug use: No     Allergies   Nicotine polacrilex; Nicotrol [nicotine]; and Codeine sulfate   Review of Systems Review of Systems  Respiratory: Negative for shortness of breath.   Musculoskeletal: Positive for back pain.   Physical Exam Triage Vital Signs ED Triage Vitals  Enc Vitals Group     BP 03/14/18 1100 (!) 143/66     Pulse Rate 03/14/18  1100 75     Resp --      Temp 03/14/18 1100 97.8 F (36.6 C)     Temp Source 03/14/18 1100 Oral     SpO2 03/14/18 1100 (!) 86 %     Weight 03/14/18 1101 155 lb (70.3 kg)     Height 03/14/18 1101 5\' 3"  (1.6 m)     Head Circumference --      Peak Flow --      Pain Score 03/14/18 1101 8     Pain Loc --      Pain Edu? --      Excl. in [place]? --    Updated Vital Signs BP (!) 143/66   Pulse 75   Temp 97.8 F (36.6 C) (Oral)   Ht 5\' 3"  (1.6 m)   Wt 70.3 kg   SpO2 (!) 86%   BMI 27.46 kg/m SPO2 on recheck was 93%.  Visual Acuity Right Eye Distance:   Left Eye Distance:   Bilateral Distance:    Right Eye Near:   Left Eye Near:    Bilateral Near:     Physical Exam  Constitutional: She is oriented to person, place, and time.  Chronically ill-appearing female no acute distress.  HENT:  Head: Normocephalic and atraumatic.  Eyes: Conjunctivae are normal.  Cardiovascular: Normal rate and regular rhythm.  Pulmonary/Chest: Effort normal. She has no wheezes.  Decreased breath sounds throughout.  Musculoskeletal:  Left low back with mild tenderness to palpation laterally.  Neurological: She is alert and oriented to person, place, and time.  Psychiatric: She has a normal mood and affect. Her behavior is normal.  Nursing note and vitals reviewed.  UC Treatments / Results  Labs (all labs ordered are listed, but only abnormal results are displayed) Labs Reviewed - No data to display  EKG None  Radiology No results found.  Procedures Procedures (including critical care  time)  Medications Ordered in UC Medications - No data to display  Initial Impression / Assessment and Plan / UC Course  I have reviewed the triage vital signs and the nursing notes.  Pertinent labs & imaging results that were available during my care of the patient were reviewed by me and considered in my medical decision making (see chart for details).    72 year old female presents with acute low back pain.  Treating with prednisone taper  Final Clinical Impressions(s) / UC Diagnoses   Final diagnoses:  Acute left-sided low back pain without sciatica     Discharge Instructions     Rest.  Prednisone as prescribed.  Follow up with your primary.  Take care  Dr. Lacinda Axon    ED Prescriptions    Medication Sig Dispense Auth. Provider   predniSONE (STERAPRED UNI-PAK 21 TAB) 10 MG (21) TBPK tablet 6 tablets on day 1; decrease by 1 tablet daily until gone. 21 tablet Coral Spikes, DO     Controlled Substance Prescriptions [place] Controlled Substance Registry consulted? Not Applicable   Coral Spikes, DO 03/14/18 1316

## 2018-03-19 ENCOUNTER — Ambulatory Visit (INDEPENDENT_AMBULATORY_CARE_PROVIDER_SITE_OTHER): Payer: Medicare Other

## 2018-03-19 ENCOUNTER — Other Ambulatory Visit: Payer: Self-pay

## 2018-03-19 ENCOUNTER — Ambulatory Visit
Admission: EM | Admit: 2018-03-19 | Discharge: 2018-03-19 | Disposition: A | Discharge status: Home / Self Care | Payer: Medicare Other | Attending: Family Medicine | Admitting: Family Medicine

## 2018-03-19 ENCOUNTER — Encounter: Payer: Self-pay | Admitting: Emergency Medicine

## 2018-03-19 DIAGNOSIS — S22080A Wedge compression fracture of T11-T12 vertebra, initial encounter for closed fracture: Secondary | ICD-10-CM

## 2018-03-19 DIAGNOSIS — M4854XA Collapsed vertebra, not elsewhere classified, thoracic region, initial encounter for fracture: Secondary | ICD-10-CM

## 2018-03-19 DIAGNOSIS — M545 Low back pain, unspecified: Secondary | ICD-10-CM

## 2018-03-19 MED ORDER — TRAMADOL HCL 50 MG PO TABS: 50.0000 mg | ORAL_TABLET | Freq: Four times a day (QID) | ORAL | 0 refills | Status: DC | PRN

## 2018-03-19 NOTE — Discharge Instructions (Signed)
Follow up with primary care provider 

## 2018-03-19 NOTE — ED Triage Notes (Addendum)
Patient in today c/o back pain x 3 weeks. Patient denies any injury. Patient was seen 03/14/18 for hip pain. Patient was prescribed steroids and Tylenol and arthritis cream. Patient denies any urinary symptoms.

## 2018-03-19 NOTE — ED Provider Notes (Addendum)
[facility]    CSN: [id number] Arrival date & time: 03/19/18  1018     History   Chief Complaint Chief Complaint  Patient presents with  . Back Pain    HPI Martha [name] is a 72 y.o. female.   72 yo female with a c/o low to mid back pain for 3 weeks. Patient denies any traumatic injuries, fevers, chills, rash, bowel or bladder problems. Patient has a h/o osteoporosis.   The history is provided by the patient.    Past Medical History:  Diagnosis Date  . Abnormal LFTs (liver function tests)   . Anxiety   . Arthritis   . Asthma   . Chronic bronchitis ([place])   . Chronic constipation   . Chronic low back pain   . Cigarette smoker    Has cut back Smoking to 1 pack every other day  . COPD (chronic obstructive pulmonary disease) ([place])   . Depression   . Diabetic neuropathy ([place])   . Diverticulitis 2015   gi recommended repeat scope in 10 years  . Essential hypertension   . Fatty liver   . GERD (gastroesophageal reflux disease)   . Gout   . History of mammogram 05/28/2013  . Hyperlipidemia   . Hypothyroidism   . Insomnia   . Iron deficiency anemia   . Low serum vitamin D   . Personal history of tobacco use, presenting hazards to health 09/29/2015  . Plantar fasciitis   . RLS (restless legs syndrome)   . Shortness of breath dyspnea   . Thyroid cancer ([place]) 2009  . Tremors of nervous system    "I think I have parkinson's disease"  . Type 2 diabetes mellitus [facility])     Patient Active Problem List   Diagnosis Date Noted  . Personal history of tobacco use, presenting hazards to health 09/29/2015  . Chronic obstructive pulmonary disease ([place]) 03/08/2015  . Type 2 diabetes mellitus ([place]) 03/08/2015  . BP (high blood pressure) 03/08/2015  . Malignant neoplasm of thyroid gland ([place]) 03/08/2015  . Has a tremor 03/08/2015  . Iron deficiency anemia due to chronic blood loss 03/08/2015  . Chronic anemia 08/24/2014  . Current tobacco use 04/27/2014    Past  Surgical History:  Procedure Laterality Date  . CATARACT EXTRACTION W/PHACO Left 11/24/2015   Procedure: CATARACT EXTRACTION PHACO AND INTRAOCULAR LENS PLACEMENT (IOC);  Surgeon: Birder Robson, MD;  Location: [facility] ORS;  Service: Ophthalmology;  Laterality: Left;  [place] 38.3 AP% 20.6 CDE 7.89 FLUID PACK LOT # [id number] H  . COLONOSCOPY  2004, 2009, 2015   [place]  . POLYPECTOMY  2009  . THYROIDECTOMY  2009   states she was tx with radiactive iodine  . TUBAL LIGATION    . UPPER GASTROINTESTINAL ENDOSCOPY      OB History   None      Home Medications    Prior to Admission medications   Medication Sig Start Date End Date Taking? Authorizing Provider  alendronate (FOSAMAX) 70 MG tablet TAKE ONE TABLET BY MOUTH EACH WEEK, ON AN EMPTY STOMACH BEFORE BREAKFAST WITH 8oz OF WATER AND REMAIN UPRIGHT FOR :30 09/17/17  Yes [provider]  aspirin EC 81 MG tablet Take 81 mg by mouth daily.    Yes [provider]  cetirizine (ZYRTEC) 10 MG tablet Take 10 mg by mouth daily.    Yes [provider]  Docusate Calcium (STOOL SOFTENER PO) Take 1 capsule by mouth daily.   Yes [provider]  ferrous sulfate 325 (65 FE) MG EC tablet Take 325 mg by mouth daily.  04/27/14 03/20/67 Yes [provider]  gabapentin (NEURONTIN) 600 MG tablet Take 600 mg by mouth 3 (three) times daily.   Yes [provider]  glipiZIDE (GLUCOTROL) 10 MG tablet Take 10 mg by mouth 2 (two) times daily.  12/10/14  Yes [provider]  hydrochlorothiazide (HYDRODIURIL) 25 MG tablet Take 25 mg by mouth at bedtime.  02/25/15  Yes [provider]  levothyroxine (SYNTHROID, LEVOTHROID) 150 MCG tablet Take 150 mcg by mouth daily before breakfast.  02/25/15  Yes [provider]  metFORMIN (GLUCOPHAGE) 1000 MG tablet Take 1,000 mg by mouth 2 (two) times daily.  12/10/14  Yes [provider]  predniSONE (STERAPRED UNI-PAK 21 TAB) 10 MG (21) TBPK tablet 6  tablets on day 1; decrease by 1 tablet daily until gone. 03/14/18  Yes Cook, Jayce G, DO  primidone (MYSOLINE) 50 MG tablet 50 mg at bedtime.  08/22/16  Yes [provider]  PROAIR HFA 108 (90 BASE) MCG/ACT inhaler Inhale 2 Inhalers into the lungs every 6 (six) hours as needed for wheezing or shortness of breath.  12/03/14  Yes [provider]  sertraline (ZOLOFT) 100 MG tablet Take 100 mg by mouth daily.   Yes [provider]  tiZANidine (ZANAFLEX) 2 MG tablet TAKE 1 TABLET BY MOUTH 3 TIMES DAILY AS NEEDED FOR MUSCLE SPASMS 12/11/17  Yes [provider]  valsartan (DIOVAN) 160 MG tablet Take 160 mg by mouth daily.  12/15/14  Yes [provider]  vitamin B-12 (CYANOCOBALAMIN) 1000 MCG tablet Take 1,000 mcg by mouth daily.   Yes [provider]  vitamin C (ASCORBIC ACID) 500 MG tablet Take 500 mg by mouth daily.   Yes [provider]  Vitamin D, Ergocalciferol, (DRISDOL) 50000 UNITS CAPS capsule Take 50,000 Units by mouth every 7 (seven) days. Tuesday 01/04/15  Yes [provider]  colchicine 0.6 MG tablet 1 tab po bid prn 06/28/15   [provider]  traMADol (ULTRAM) 50 MG tablet Take 1 tablet (50 mg total) by mouth every 6 (six) hours as needed. 03/19/18   Norval Gable, MD    Family History Family History  Problem Relation Age of Onset  . Diabetes Mellitus II Father   . Lung disease Father   . Diabetes Mellitus II Mother   . Arthritis Mother   . Thyroid disease Unknown   . Asthma Sister   . Emphysema Sister   . Hypertension Sister   . Breast cancer Paternal Aunt     Social History Social History   Tobacco Use  . Smoking status: Current Every Day Smoker    Packs/day: 0.50    Years: 40.00    Pack years: 20.00    Types: Cigarettes  . Smokeless tobacco: Never Used  . Tobacco comment: curently smokes 12 cigerattes a day. I'm trying to cut back.  Substance Use Topics  . Alcohol use: No    Alcohol/week: 0.0  standard drinks  . Drug use: No     Allergies   Nicotine polacrilex; Nicotrol [nicotine]; and Codeine sulfate   Review of Systems Review of Systems   Physical Exam Triage Vital Signs ED Triage Vitals  Enc Vitals Group     BP 03/19/18 1031 114/65     Pulse Rate 03/19/18 1031 80     Resp 03/19/18 1031 16     Temp 03/19/18 1031 (!) 97.4 F (36.3  C)     Temp Source 03/19/18 1031 Oral     SpO2 03/19/18 1031 95 %     Weight 03/19/18 1032 154 lb (69.9 kg)     Height 03/19/18 1032 5\' 3"  (1.6 m)     Head Circumference --      Peak Flow --      Pain Score 03/19/18 1032 10     Pain Loc --      Pain Edu? --      Excl. in [place]? --    No data found.  Updated Vital Signs BP 114/65 (BP Location: Left Arm)   Pulse 80   Temp (!) 97.4 F (36.3 C) (Oral)   Resp 16   Ht 5\' 3"  (1.6 m)   Wt 69.9 kg   SpO2 95%   BMI 27.28 kg/m   Visual Acuity Right Eye Distance:   Left Eye Distance:   Bilateral Distance:    Right Eye Near:   Left Eye Near:    Bilateral Near:     Physical Exam  Constitutional: She appears well-developed and well-nourished. No distress.  Musculoskeletal:       Lumbar back: She exhibits tenderness and bony tenderness. She exhibits normal range of motion, no swelling, no edema, no deformity, no laceration, no pain, no spasm and normal pulse.  Skin: She is not diaphoretic.  Nursing note and vitals reviewed.    UC Treatments / Results  Labs (all labs ordered are listed, but only abnormal results are displayed) Labs Reviewed - No data to display  EKG None  Radiology Dg Lumbar Spine Complete  Result Date: 03/19/2018 CLINICAL DATA:  Low back pain, right-sided over the last week after twisting injury EXAM: LUMBAR SPINE - COMPLETE 4+ VIEW COMPARISON:  CT abdomen pelvis of 08/27/2014 FINDINGS: The lumbar vertebrae are normal alignment with only mild degenerative disc disease at L5-S1. However, there is a mild compression deformity of the superior endplate of  Q00 with approximately 30% compression deformity. Very mild retropulsion may be present. Moderate abdominal aortic atherosclerosis is noted. The bowel gas pattern is nonspecific. IMPRESSION: 1. 30% compression deformity of the anterior superior aspect of T12 vertebral body with very minimal retropulsion. 2. The lumbar vertebrae are normal alignment with mild degenerative disc disease at L5-S1. Electronically Signed   By: Ivar Drape M.D.   On: 03/19/2018 11:38    Procedures Procedures (including critical care time)  Medications Ordered in UC Medications - No data to display  Initial Impression / Assessment and Plan / UC Course  I have reviewed the triage vital signs and the nursing notes.  Pertinent labs & imaging results that were available during my care of the patient were reviewed by me and considered in my medical decision making (see chart for details).      Final Clinical Impressions(s) / UC Diagnoses   Final diagnoses:  Compression fracture of T12 vertebra, initial encounter ([place])  Acute left-sided low back pain without sciatica     Discharge Instructions     Follow up with primary care provider     ED Prescriptions    Medication Sig Dispense Auth. Provider   traMADol (ULTRAM) 50 MG tablet Take 1 tablet (50 mg total) by mouth every 6 (six) hours as needed. 15 tablet Breckon Reeves, Linward Foster, MD      1. x-ray results and diagnosis reviewed with patient 2. rx as per orders above; reviewed possible side effects, interactions, risks and benefits  3. Recommend follow up with PCP  4. Follow-up prn if symptoms worsen or don't improve   Controlled Substance Prescriptions [place] Controlled Substance Registry consulted? Not Applicable   Norval Gable, MD 03/19/18 [place], [place], MD 03/19/18 1504

## 2018-03-25 ENCOUNTER — Ambulatory Visit
Admission: EM | Admit: 2018-03-25 | Discharge: 2018-03-25 | Disposition: A | Discharge status: Home / Self Care | Payer: Medicare Other | Attending: Family Medicine | Admitting: Family Medicine

## 2018-03-25 ENCOUNTER — Encounter: Payer: Self-pay | Admitting: Emergency Medicine

## 2018-03-25 ENCOUNTER — Other Ambulatory Visit: Payer: Self-pay

## 2018-03-25 ENCOUNTER — Inpatient Hospital Stay: Payer: Medicare Other | Attending: Internal Medicine

## 2018-03-25 DIAGNOSIS — Z79899 Other long term (current) drug therapy: Secondary | ICD-10-CM | POA: Insufficient documentation

## 2018-03-25 DIAGNOSIS — Z8781 Personal history of (healed) traumatic fracture: Secondary | ICD-10-CM | POA: Diagnosis not present

## 2018-03-25 DIAGNOSIS — D500 Iron deficiency anemia secondary to blood loss (chronic): Secondary | ICD-10-CM | POA: Diagnosis not present

## 2018-03-25 DIAGNOSIS — Z85850 Personal history of malignant neoplasm of thyroid: Secondary | ICD-10-CM | POA: Diagnosis not present

## 2018-03-25 LAB — CBC WITH DIFFERENTIAL/PLATELET
ABS IMMATURE GRANULOCYTES: 0.16 10*3/uL — AB (ref 0.00–0.07)
Basophils Absolute: 0.1 10*3/uL (ref 0.0–0.1)
Basophils Relative: 0 %
EOS ABS: 0.1 10*3/uL (ref 0.0–0.5)
Eosinophils Relative: 0 %
HCT: 46.8 % — ABNORMAL HIGH (ref 36.0–46.0)
Hemoglobin: 15.8 g/dL — ABNORMAL HIGH (ref 12.0–15.0)
Immature Granulocytes: 1 %
LYMPHS ABS: 0.9 10*3/uL (ref 0.7–4.0)
Lymphocytes Relative: 4 %
MCH: 29.9 pg (ref 26.0–34.0)
MCHC: 33.8 g/dL (ref 30.0–36.0)
MCV: 88.6 fL (ref 80.0–100.0)
MONO ABS: 0.8 10*3/uL (ref 0.1–1.0)
MONOS PCT: 3 %
NEUTROS PCT: 92 %
Neutro Abs: 22.7 10*3/uL — ABNORMAL HIGH (ref 1.7–7.7)
PLATELETS: 310 10*3/uL (ref 150–400)
RBC: 5.28 MIL/uL — AB (ref 3.87–5.11)
RDW: 13.8 % (ref 11.5–15.5)
WBC: 24.8 10*3/uL — ABNORMAL HIGH (ref 4.0–10.5)
nRBC: 0 % (ref 0.0–0.2)

## 2018-03-25 LAB — COMPREHENSIVE METABOLIC PANEL
ALBUMIN: 4.3 g/dL (ref 3.5–5.0)
ALK PHOS: 98 U/L (ref 38–126)
ALT: 15 U/L (ref 0–44)
ANION GAP: 14 (ref 5–15)
AST: 22 U/L (ref 15–41)
BUN: 15 mg/dL (ref 8–23)
CALCIUM: 8.9 mg/dL (ref 8.9–10.3)
CO2: 25 mmol/L (ref 22–32)
CREATININE: 0.59 mg/dL (ref 0.44–1.00)
Chloride: 92 mmol/L — ABNORMAL LOW (ref 98–111)
GFR calc Af Amer: >60 mL/min (ref >60)
GFR calc non Af Amer: >60 mL/min (ref >60)
GLUCOSE: 155 mg/dL — AB (ref 70–99)
Potassium: 3.1 mmol/L — ABNORMAL LOW (ref 3.5–5.1)
SODIUM: 131 mmol/L — AB (ref 135–145)
Total Bilirubin: 0.3 mg/dL (ref 0.3–1.2)
Total Protein: 7.8 g/dL (ref 6.5–8.1)

## 2018-03-25 LAB — IRON AND TIBC
Iron: 57 ug/dL (ref 28–170)
Saturation Ratios: 15 % (ref 10.4–31.8)
TIBC: 388 ug/dL (ref 250–450)
UIBC: 331 ug/dL

## 2018-03-25 LAB — FERRITIN: Ferritin: 22 ng/mL (ref 11–307)

## 2018-03-25 MED ORDER — TRAMADOL HCL 50 MG PO TABS: 50.0000 mg | ORAL_TABLET | Freq: Four times a day (QID) | ORAL | 0 refills | Status: DC | PRN

## 2018-03-25 NOTE — ED Provider Notes (Signed)
[facility]    CSN: [id number] Arrival date & time: 03/25/18  1453     History   Chief Complaint Chief Complaint  Patient presents with  . Back Pain    HPI Martha [name] is a 72 y.o. female.   72 yo female seen one week ago here with back pain found to have a vertebral compression fracture. States has not seen PCP yet, but has an appointment for next month. States tramadol helps and is requesting a refill on the medication.   The history is provided by the patient.  Back Pain    Past Medical History:  Diagnosis Date  . Abnormal LFTs (liver function tests)   . Anxiety   . Arthritis   . Asthma   . Chronic bronchitis ([place])   . Chronic constipation   . Chronic low back pain   . Cigarette smoker    Has cut back Smoking to 1 pack every other day  . COPD (chronic obstructive pulmonary disease) ([place])   . Depression   . Diabetic neuropathy ([place])   . Diverticulitis 2015   gi recommended repeat scope in 10 years  . Essential hypertension   . Fatty liver   . GERD (gastroesophageal reflux disease)   . Gout   . History of mammogram 05/28/2013  . Hyperlipidemia   . Hypothyroidism   . Insomnia   . Iron deficiency anemia   . Low serum vitamin D   . Personal history of tobacco use, presenting hazards to health 09/29/2015  . Plantar fasciitis   . RLS (restless legs syndrome)   . Shortness of breath dyspnea   . Thyroid cancer ([place]) 2009  . Tremors of nervous system    "I think I have parkinson's disease"  . Type 2 diabetes mellitus [facility])     Patient Active Problem List   Diagnosis Date Noted  . Personal history of tobacco use, presenting hazards to health 09/29/2015  . Chronic obstructive pulmonary disease ([place]) 03/08/2015  . Type 2 diabetes mellitus ([place]) 03/08/2015  . BP (high blood pressure) 03/08/2015  . Malignant neoplasm of thyroid gland ([place]) 03/08/2015  . Has a tremor 03/08/2015  . Iron deficiency anemia due to chronic blood loss 03/08/2015  .  Chronic anemia 08/24/2014  . Current tobacco use 04/27/2014    Past Surgical History:  Procedure Laterality Date  . CATARACT EXTRACTION W/PHACO Left 11/24/2015   Procedure: CATARACT EXTRACTION PHACO AND INTRAOCULAR LENS PLACEMENT (IOC);  Surgeon: Birder Robson, MD;  Location: [facility] ORS;  Service: Ophthalmology;  Laterality: Left;  [place] 38.3 AP% 20.6 CDE 7.89 FLUID PACK LOT # [id number] H  . COLONOSCOPY  2004, 2009, 2015   [place]  . POLYPECTOMY  2009  . THYROIDECTOMY  2009   states she was tx with radiactive iodine  . TUBAL LIGATION    . UPPER GASTROINTESTINAL ENDOSCOPY      OB History   None      Home Medications    Prior to Admission medications   Medication Sig Start Date End Date Taking? Authorizing Provider  alendronate (FOSAMAX) 70 MG tablet TAKE ONE TABLET BY MOUTH EACH WEEK, ON AN EMPTY STOMACH BEFORE BREAKFAST WITH 8oz OF WATER AND REMAIN UPRIGHT FOR :30 09/17/17  Yes [provider]  aspirin EC 81 MG tablet Take 81 mg by mouth daily.    Yes [provider]  cetirizine (ZYRTEC) 10 MG tablet Take 10 mg by mouth daily.    Yes [provider]  colchicine 0.6  MG tablet 1 tab po bid prn 06/28/15  Yes [provider]  Docusate Calcium (STOOL SOFTENER PO) Take 1 capsule by mouth daily.   Yes [provider]  ferrous sulfate 325 (65 FE) MG EC tablet Take 325 mg by mouth daily.  04/27/14 03/20/67 Yes [provider]  gabapentin (NEURONTIN) 600 MG tablet Take 600 mg by mouth 3 (three) times daily.   Yes [provider]  glipiZIDE (GLUCOTROL) 10 MG tablet Take 10 mg by mouth 2 (two) times daily.  12/10/14  Yes [provider]  hydrochlorothiazide (HYDRODIURIL) 25 MG tablet Take 25 mg by mouth at bedtime.  02/25/15  Yes [provider]  levothyroxine (SYNTHROID, LEVOTHROID) 150 MCG tablet Take 150 mcg by mouth daily before breakfast.  02/25/15  Yes [provider]  metFORMIN (GLUCOPHAGE) 1000 MG  tablet Take 1,000 mg by mouth 2 (two) times daily.  12/10/14  Yes [provider]  predniSONE (STERAPRED UNI-PAK 21 TAB) 10 MG (21) TBPK tablet 6 tablets on day 1; decrease by 1 tablet daily until gone. 03/14/18  Yes Cook, Jayce G, DO  primidone (MYSOLINE) 50 MG tablet 50 mg at bedtime.  08/22/16  Yes [provider]  PROAIR HFA 108 (90 BASE) MCG/ACT inhaler Inhale 2 Inhalers into the lungs every 6 (six) hours as needed for wheezing or shortness of breath.  12/03/14  Yes [provider]  sertraline (ZOLOFT) 100 MG tablet Take 100 mg by mouth daily.   Yes [provider]  tiZANidine (ZANAFLEX) 2 MG tablet TAKE 1 TABLET BY MOUTH 3 TIMES DAILY AS NEEDED FOR MUSCLE SPASMS 12/11/17  Yes [provider]  valsartan (DIOVAN) 160 MG tablet Take 160 mg by mouth daily.  12/15/14  Yes [provider]  vitamin B-12 (CYANOCOBALAMIN) 1000 MCG tablet Take 1,000 mcg by mouth daily.   Yes [provider]  vitamin C (ASCORBIC ACID) 500 MG tablet Take 500 mg by mouth daily.   Yes [provider]  traMADol (ULTRAM) 50 MG tablet Take 1 tablet (50 mg total) by mouth every 6 (six) hours as needed. 03/25/18   Norval Gable, MD  Vitamin D, Ergocalciferol, (DRISDOL) 50000 UNITS CAPS capsule Take 50,000 Units by mouth every 7 (seven) days. Tuesday 01/04/15   [provider]    Family History Family History  Problem Relation Age of Onset  . Diabetes Mellitus II Father   . Lung disease Father   . Diabetes Mellitus II Mother   . Arthritis Mother   . Thyroid disease Unknown   . Asthma Sister   . Emphysema Sister   . Hypertension Sister   . Breast cancer Paternal Aunt     Social History Social History   Tobacco Use  . Smoking status: Current Every Day Smoker    Packs/day: 0.50    Years: 40.00    Pack years: 20.00    Types: Cigarettes  . Smokeless tobacco: Never Used  . Tobacco comment: curently smokes 12 cigerattes a day. I'm trying to cut  back.  Substance Use Topics  . Alcohol use: No    Alcohol/week: 0.0 standard drinks  . Drug use: No     Allergies   Nicotine polacrilex; Nicotrol [nicotine]; and Codeine sulfate   Review of Systems Review of Systems  Musculoskeletal: Positive for back pain.     Physical Exam Triage Vital Signs ED Triage Vitals  Enc Vitals Group     BP 03/25/18 1502 (S) (!) 180/79  Pulse Rate 03/25/18 1502 97     Resp 03/25/18 1502 16     Temp 03/25/18 1502 98.2 F (36.8 C)     Temp Source 03/25/18 1502 Oral     SpO2 03/25/18 1502 94 %     Weight 03/25/18 1459 150 lb (68 kg)     Height 03/25/18 1459 5\' 3"  (1.6 m)     Head Circumference --      Peak Flow --      Pain Score 03/25/18 1459 8     Pain Loc --      Pain Edu? --      Excl. in [place]? --    No data found.  Updated Vital Signs BP (S) (!) 180/79 (BP Location: Left Arm)   Pulse 97   Temp 98.2 F (36.8 C) (Oral)   Resp 16   Ht 5\' 3"  (1.6 m)   Wt 68 kg   SpO2 94%   BMI 26.57 kg/m   Visual Acuity Right Eye Distance:   Left Eye Distance:   Bilateral Distance:    Right Eye Near:   Left Eye Near:    Bilateral Near:     Physical Exam  Constitutional: She appears well-developed and well-nourished. No distress.  HENT:  Right Ear: Tympanic membrane and ear canal normal.  Left Ear: Tympanic membrane and ear canal normal.  Nose: Mucosal edema and rhinorrhea present. No nose lacerations, sinus tenderness, nasal deformity, septal deviation or nasal septal hematoma. No epistaxis.  No foreign bodies. Right sinus exhibits maxillary sinus tenderness and frontal sinus tenderness. Left sinus exhibits maxillary sinus tenderness and frontal sinus tenderness.  Mouth/Throat: Uvula is midline and mucous membranes are normal.  Cardiovascular: Normal rate.  Pulmonary/Chest: Effort normal. No respiratory distress.  Musculoskeletal:       Lumbar back: She exhibits tenderness (upper lumbar/lower thoracic).  Skin: She is not diaphoretic.    Nursing note and vitals reviewed.    UC Treatments / Results  Labs (all labs ordered are listed, but only abnormal results are displayed) Labs Reviewed - No data to display  EKG None  Radiology No results found.  Procedures Procedures (including critical care time)  Medications Ordered in UC Medications - No data to display  Initial Impression / Assessment and Plan / UC Course  I have reviewed the triage vital signs and the nursing notes.  Pertinent labs & imaging results that were available during my care of the patient were reviewed by me and considered in my medical decision making (see chart for details).      Final Clinical Impressions(s) / UC Diagnoses   Final diagnoses:  History of vertebral compression fracture     Discharge Instructions     Follow up with primary care provider    ED Prescriptions    Medication Sig Dispense Auth. Provider   traMADol (ULTRAM) 50 MG tablet  (Status: Discontinued) Take 1 tablet (50 mg total) by mouth every 6 (six) hours as needed. 30 tablet Norval Gable, MD   traMADol (ULTRAM) 50 MG tablet Take 1 tablet (50 mg total) by mouth every 6 (six) hours as needed. 30 tablet Norval Gable, MD      1. diagnosis reviewed with patient 2. rx as per orders above; reviewed possible side effects, interactions, risks and benefits  3. Follow up with PCP 4. Follow up here prn Controlled Substance Prescriptions [place] Controlled Substance Registry consulted? Not Applicable   Norval Gable, MD 03/25/18 1530

## 2018-03-25 NOTE — Discharge Instructions (Signed)
Follow up with primary care provider 

## 2018-03-25 NOTE — ED Triage Notes (Signed)
Patient c/o ongoing back pain that has not gotten better since her last visit here.  Patient states that she wants a refill on her Tramadol.

## 2018-04-01 ENCOUNTER — Inpatient Hospital Stay: Payer: Medicare Other | Attending: Internal Medicine | Admitting: Internal Medicine

## 2018-04-01 ENCOUNTER — Encounter: Payer: Self-pay | Admitting: Internal Medicine

## 2018-04-01 VITALS — BP 174/89 | HR 101 | Temp 97.9°F | Resp 16 | Wt 155.0 lb

## 2018-04-01 DIAGNOSIS — E871 Hypo-osmolality and hyponatremia: Secondary | ICD-10-CM

## 2018-04-01 DIAGNOSIS — D500 Iron deficiency anemia secondary to blood loss (chronic): Secondary | ICD-10-CM | POA: Insufficient documentation

## 2018-04-01 DIAGNOSIS — D751 Secondary polycythemia: Secondary | ICD-10-CM | POA: Insufficient documentation

## 2018-04-01 DIAGNOSIS — D72829 Elevated white blood cell count, unspecified: Secondary | ICD-10-CM | POA: Diagnosis not present

## 2018-04-01 DIAGNOSIS — F17210 Nicotine dependence, cigarettes, uncomplicated: Secondary | ICD-10-CM | POA: Insufficient documentation

## 2018-04-01 DIAGNOSIS — C73 Malignant neoplasm of thyroid gland: Secondary | ICD-10-CM | POA: Insufficient documentation

## 2018-04-01 LAB — THYROGLOBULIN BY IMA: THYROGLOBULIN BY: 0.2 ng/mL

## 2018-04-01 LAB — THYROID PANEL WITH TSH
Free Thyroxine Index: 3 (ref 1.2–4.9)
T3 UPTAKE RATIO: 31 %
T4, Total: 9.7 ug/dL (ref 4.5–12.0)
TSH: 1.52 u[IU]/mL (ref 0.450–4.500)

## 2018-04-01 LAB — TGAB+THYROGLOBULIN IMA OR RIA

## 2018-04-01 NOTE — Assessment & Plan Note (Addendum)
#  Iron deficient anemia unclear etiology question malabsorption-responded well to IV iron.  Currently on p.o. iron.   #  However today today Hb 15.8/ HCT- 46.8.  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  Monitor for now- will repeat labs in 3 months-CBC BMP erythropoietin level Jak 2 mutation.  # Thyroid cancer; there is post radioactive iodine 2009; clinically no evidence of recurrence.  Continue Synthroid.  Repeat tumor markers.  # Smoker/ lung cancer screening program-August 2019 CT scan negative for any concerning nodules.  Because cessation not interested.  # Leucocytosis/neutrophilia- ? Prednisone; recent LCSP- CT aug 2019- WNL.   # Hyponatremia sodium-131 chronic-STABLE  # Mild Hypokalemia- 3.1 sec to diuretics- recommend dietary supplementation.   # DISPOSITION: # labs- in 3 months/erythropoietin/cbc/Jak-2 testing.  # Follow up in 6 months- Dr.Corocoran/labs- cbc/bmp/iron studies/ferrtin/thyroid studies.-Dr.B

## 2018-04-01 NOTE — Progress Notes (Signed)
[facility] CONSULT NOTE  Patient Care Team: Tracie Harrier, MD as PCP - General (Internal Medicine)  CHIEF COMPLAINTS/PURPOSE OF CONSULTATION: Anemia   Oncology History   # AUG 2015- IRON DEFICIENCY ANEMIA- [Oct 2016 hb~8.7;MCV-76 Ferritin- 13] s/p colo [2015] & EGD; April 2016- CT A/P- NEG; NOV 2016- Hb- 8.5/15-Ferritin-[place] x2;  # Thyroid ca s/p S & RAIU [June 2009]-on Synthroid.  # Chronic tremors; Active smoker     Malignant neoplasm of thyroid gland (HCC)     HISTORY OF PRESENTING ILLNESS:  Martha [name] 72 y.o. pleasant female with iron deficiency anemia of unclear etiology [question malabsorption] is here for follow-up.  Patient unfortunately continues to smoke.  Complains of hip pain for which she has been started on prednisone by PCP.   Has chronic cough chronic shortness of breath.  Chronic tremors.  Not worse.  Review of Systems  Constitutional: Negative for chills, diaphoresis, fever, malaise/fatigue and weight loss.  HENT: Negative for nosebleeds and sore throat.   Eyes: Negative for double vision.  Respiratory: Positive for cough, sputum production and shortness of breath. Negative for hemoptysis.   Cardiovascular: Negative for chest pain, palpitations, orthopnea and leg swelling.  Gastrointestinal: Negative for abdominal pain, blood in stool, constipation, diarrhea, heartburn, melena, nausea and vomiting.  Genitourinary: Negative for dysuria, frequency and urgency.  Musculoskeletal: Negative for back pain and joint pain.  Skin: Negative.  Negative for itching and rash.  Neurological: Negative for dizziness, tingling, focal weakness, weakness and headaches.  Endo/Heme/Allergies: Does not bruise/bleed easily.  Psychiatric/Behavioral: Negative for depression. The patient is not nervous/anxious and does not have insomnia.      MEDICAL HISTORY:  Past Medical History:  Diagnosis Date  . Abnormal LFTs (liver function tests)   . Anxiety    . Arthritis   . Asthma   . Chronic bronchitis ([place])   . Chronic constipation   . Chronic low back pain   . Cigarette smoker    Has cut back Smoking to 1 pack every other day  . COPD (chronic obstructive pulmonary disease) ([place])   . Depression   . Diabetic neuropathy ([place])   . Diverticulitis 2015   gi recommended repeat scope in 10 years  . Essential hypertension   . Fatty liver   . GERD (gastroesophageal reflux disease)   . Gout   . History of mammogram 05/28/2013  . Hyperlipidemia   . Hypothyroidism   . Insomnia   . Iron deficiency anemia   . Low serum vitamin D   . Personal history of tobacco use, presenting hazards to health 09/29/2015  . Plantar fasciitis   . RLS (restless legs syndrome)   . Shortness of breath dyspnea   . Thyroid cancer ([place]) 2009  . Tremors of nervous system    "I think I have parkinson's disease"  . Type 2 diabetes mellitus ([place])     SURGICAL HISTORY: Past Surgical History:  Procedure Laterality Date  . CATARACT EXTRACTION W/PHACO Left 11/24/2015   Procedure: CATARACT EXTRACTION PHACO AND INTRAOCULAR LENS PLACEMENT (IOC);  Surgeon: Birder Robson, MD;  Location: [facility] ORS;  Service: Ophthalmology;  Laterality: Left;  [place] 38.3 AP% 20.6 CDE 7.89 FLUID PACK LOT # [id number] H  . COLONOSCOPY  2004, 2009, 2015   [place]  . POLYPECTOMY  2009  . THYROIDECTOMY  2009   states she was tx with radiactive iodine  . TUBAL LIGATION    . UPPER GASTROINTESTINAL ENDOSCOPY      SOCIAL HISTORY: Social History  Socioeconomic History  . Marital status: Divorced    Spouse name: Not on file  . Number of children: Not on file  . Years of education: Not on file  . Highest education level: Not on file  Occupational History  . Not on file  Social Needs  . Financial resource strain: Not on file  . Food insecurity:    Worry: Not on file    Inability: Not on file  . Transportation needs:    Medical: Not on file    Non-medical: Not on file  Tobacco Use  .  Smoking status: Current Every Day Smoker    Packs/day: 0.50    Years: 40.00    Pack years: 20.00    Types: Cigarettes  . Smokeless tobacco: Never Used  . Tobacco comment: curently smokes 12 cigerattes a day. I'm trying to cut back.  Substance and Sexual Activity  . Alcohol use: No    Alcohol/week: 0.0 standard drinks  . Drug use: No  . Sexual activity: Never  Lifestyle  . Physical activity:    Days per week: Not on file    Minutes per session: Not on file  . Stress: Not on file  Relationships  . Social connections:    Talks on phone: Not on file    Gets together: Not on file    Attends religious service: Not on file    Active member of club or organization: Not on file    Attends meetings of clubs or organizations: Not on file    Relationship status: Not on file  . Intimate partner violence:    Fear of current or ex partner: Not on file    Emotionally abused: Not on file    Physically abused: Not on file    Forced sexual activity: Not on file  Other Topics Concern  . Not on file  Social History Narrative  . Not on file    FAMILY HISTORY: Family History  Problem Relation Age of Onset  . Diabetes Mellitus II Father   . Lung disease Father   . Diabetes Mellitus II Mother   . Arthritis Mother   . Thyroid disease Unknown   . Asthma Sister   . Emphysema Sister   . Hypertension Sister   . Breast cancer Paternal Aunt     ALLERGIES:  is allergic to nicotine polacrilex; nicotrol [nicotine]; and codeine sulfate.  MEDICATIONS:  Current Outpatient Medications  Medication Sig Dispense Refill  . alendronate (FOSAMAX) 70 MG tablet TAKE ONE TABLET BY MOUTH EACH WEEK, ON AN EMPTY STOMACH BEFORE BREAKFAST WITH 8oz OF WATER AND REMAIN UPRIGHT FOR :30    . aspirin EC 81 MG tablet Take 81 mg by mouth daily.     . cetirizine (ZYRTEC) 10 MG tablet Take 10 mg by mouth daily.     . colchicine 0.6 MG tablet 1 tab po bid prn    . Docusate Calcium (STOOL SOFTENER PO) Take 1 capsule by  mouth daily.    . ferrous sulfate 325 (65 FE) MG EC tablet Take 325 mg by mouth daily.     Marland Kitchen gabapentin (NEURONTIN) 600 MG tablet Take 600 mg by mouth 3 (three) times daily.    Marland Kitchen glipiZIDE (GLUCOTROL) 10 MG tablet Take 10 mg by mouth 2 (two) times daily.     . hydrochlorothiazide (HYDRODIURIL) 25 MG tablet Take 25 mg by mouth at bedtime.     Marland Kitchen levothyroxine (SYNTHROID, LEVOTHROID) 150 MCG tablet Take 150 mcg by mouth daily  before breakfast.     . metFORMIN (GLUCOPHAGE) 1000 MG tablet Take 1,000 mg by mouth 2 (two) times daily.     . predniSONE (STERAPRED UNI-PAK 21 TAB) 10 MG (21) TBPK tablet 6 tablets on day 1; decrease by 1 tablet daily until gone. 21 tablet 0  . primidone (MYSOLINE) 50 MG tablet 50 mg at bedtime.     Marland Kitchen PROAIR HFA 108 (90 BASE) MCG/ACT inhaler Inhale 2 Inhalers into the lungs every 6 (six) hours as needed for wheezing or shortness of breath.     . sertraline (ZOLOFT) 100 MG tablet Take 100 mg by mouth daily.    Marland Kitchen tiZANidine (ZANAFLEX) 2 MG tablet TAKE 1 TABLET BY MOUTH 3 TIMES DAILY AS NEEDED FOR MUSCLE SPASMS    . traMADol (ULTRAM) 50 MG tablet Take 1 tablet (50 mg total) by mouth every 6 (six) hours as needed. 30 tablet 0  . valsartan (DIOVAN) 160 MG tablet Take 160 mg by mouth daily.     . vitamin B-12 (CYANOCOBALAMIN) 1000 MCG tablet Take 1,000 mcg by mouth daily.    . vitamin C (ASCORBIC ACID) 500 MG tablet Take 500 mg by mouth daily.    . Vitamin D, Ergocalciferol, (DRISDOL) 50000 UNITS CAPS capsule Take 50,000 Units by mouth every 7 (seven) days. Tuesday     No current facility-administered medications for this visit.       Marland Kitchen  PHYSICAL EXAMINATION:   Vitals:   04/01/18 1430  BP: (!) 174/89  Pulse: (!) 101  Resp: 16  Temp: 97.9 F (36.6 C)   Filed Weights   04/01/18 1430  Weight: 155 lb (70.3 kg)    Physical Exam  Constitutional: She is oriented to person, place, and time and well-developed, well-nourished, and in no distress.  Alone.  She is in a  wheelchair because of arthritis.  Chronic bilateral tremors upper extremities.  HENT:  Head: Normocephalic and atraumatic.  Mouth/Throat: Oropharynx is clear and moist. No oropharyngeal exudate.  Eyes: Pupils are equal, round, and reactive to light.  Neck: Normal range of motion. Neck supple.  Cardiovascular: Normal rate and regular rhythm.  Decreased air entry bilaterally.  Pulmonary/Chest: No respiratory distress. She has no wheezes.  Abdominal: Soft. Bowel sounds are normal. She exhibits no distension and no mass. There is no tenderness. There is no rebound and no guarding.  Musculoskeletal: Normal range of motion. She exhibits no edema or tenderness.  Tremors.  Chronic  Neurological: She is alert and oriented to person, place, and time.  Skin: Skin is warm.  Psychiatric: Affect normal.     LABORATORY DATA:  I have reviewed the data as listed Lab Results  Component Value Date   WBC 24.8 (H) 03/25/2018   HGB 15.8 (H) 03/25/2018   HCT 46.8 (H) 03/25/2018   MCV 88.6 03/25/2018   PLT 310 03/25/2018   Recent Labs    09/17/17 1016 03/25/18 1434  NA 131* 131*  K 3.7 3.1*  CL 91* 92*  CO2 26 25  GLUCOSE 190* 155*  BUN 11 15  CREATININE 0.74 0.59  CALCIUM 9.1 8.9  GFRNONAA >60 >60  GFRAA >60 >60  PROT 7.8 7.8  ALBUMIN 4.0 4.3  AST 21 22  ALT 15 15  ALKPHOS 82 98  BILITOT 0.3 0.3     ASSESSMENT & PLAN:   Iron deficiency anemia due to chronic blood loss #Iron deficient anemia unclear etiology question malabsorption-responded well to IV iron.  Currently on p.o. iron.   #  However today today Hb 15.8/ HCT- 46.8.  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  Monitor for now- will repeat labs in 3 months-CBC BMP erythropoietin level Jak 2 mutation.  # Thyroid cancer; there is post radioactive iodine 2009; clinically no evidence of recurrence.  Continue Synthroid.  Repeat tumor markers.  # Smoker/ lung cancer screening program-August 2019 CT scan negative for any concerning  nodules.  Because cessation not interested.  # Leucocytosis/neutrophilia- ? Prednisone; recent LCSP- CT aug 2019- WNL.   # Hyponatremia sodium-131 chronic-STABLE  # Mild Hypokalemia- 3.1 sec to diuretics- recommend dietary supplementation.   # DISPOSITION: # labs- in 3 months/erythropoietin/cbc/Jak-2 testing.  # Follow up in 6 months- Dr.Corocoran/labs- cbc/bmp/iron studies/ferrtin/thyroid studies.-Dr.B    Cammie Sickle, MD 04/01/2018 4:06 PM

## 2018-05-31 ENCOUNTER — Encounter: Payer: Self-pay | Admitting: Emergency Medicine

## 2018-05-31 ENCOUNTER — Ambulatory Visit
Admission: EM | Admit: 2018-05-31 | Discharge: 2018-05-31 | Disposition: A | Discharge status: Home / Self Care | Payer: Medicare Other | Attending: Family Medicine | Admitting: Family Medicine

## 2018-05-31 ENCOUNTER — Other Ambulatory Visit: Payer: Self-pay

## 2018-05-31 DIAGNOSIS — R42 Dizziness and giddiness: Secondary | ICD-10-CM

## 2018-05-31 DIAGNOSIS — I10 Essential (primary) hypertension: Secondary | ICD-10-CM | POA: Diagnosis not present

## 2018-05-31 MED ORDER — MECLIZINE HCL 25 MG PO TABS: 25.0000 mg | ORAL_TABLET | Freq: Three times a day (TID) | ORAL | 0 refills | Status: DC | PRN

## 2018-05-31 NOTE — ED Provider Notes (Signed)
[facility]  CSN: [id number] Arrival date & time: 05/31/18  1441   History   Chief Complaint Chief Complaint  Patient presents with  . Dizziness   HPI  73 year old female with an extensive PMH presents with dizziness.   Patient reports ongoing chronic cough. Daughter states she has been less active and has been in the bed a lot since she has been having back pain. This morning patient states that she developed dizziness. Describes it as being off balance. Worse with activity. No reports of nausea or vomiting. No known relieving factors. No other associated symptoms. No other complaints.   PMH, Surgical Hx, Family Hx, Social History reviewed and updated as below.  Past Medical History:  Diagnosis Date  . Abnormal LFTs (liver function tests)   . Anxiety   . Arthritis   . Asthma   . Chronic bronchitis ([place])   . Chronic constipation   . Chronic low back pain   . Cigarette smoker    Has cut back Smoking to 1 pack every other day  . COPD (chronic obstructive pulmonary disease) ([place])   . Depression   . Diabetic neuropathy ([place])   . Diverticulitis 2015   gi recommended repeat scope in 10 years  . Essential hypertension   . Fatty liver   . GERD (gastroesophageal reflux disease)   . Gout   . History of mammogram 05/28/2013  . Hyperlipidemia   . Hypothyroidism   . Insomnia   . Iron deficiency anemia   . Low serum vitamin D   . Personal history of tobacco use, presenting hazards to health 09/29/2015  . Plantar fasciitis   . RLS (restless legs syndrome)   . Shortness of breath dyspnea   . Thyroid cancer ([place]) 2009  . Tremors of nervous system    "I think I have parkinson's disease"  . Type 2 diabetes mellitus [facility])     Patient Active Problem List   Diagnosis Date Noted  . Personal history of tobacco use, presenting hazards to health 09/29/2015  . Chronic obstructive pulmonary disease ([place]) 03/08/2015  . Type 2 diabetes mellitus ([place]) 03/08/2015  . BP (high blood  pressure) 03/08/2015  . Malignant neoplasm of thyroid gland ([place]) 03/08/2015  . Has a tremor 03/08/2015  . Iron deficiency anemia due to chronic blood loss 03/08/2015  . Chronic anemia 08/24/2014  . Current tobacco use 04/27/2014    Past Surgical History:  Procedure Laterality Date  . CATARACT EXTRACTION W/PHACO Left 11/24/2015   Procedure: CATARACT EXTRACTION PHACO AND INTRAOCULAR LENS PLACEMENT (IOC);  Surgeon: Birder Robson, MD;  Location: [facility] ORS;  Service: Ophthalmology;  Laterality: Left;  [place] 38.3 AP% 20.6 CDE 7.89 FLUID PACK LOT # [id number] H  . COLONOSCOPY  2004, 2009, 2015   [place]  . POLYPECTOMY  2009  . THYROIDECTOMY  2009   states she was tx with radiactive iodine  . TUBAL LIGATION    . UPPER GASTROINTESTINAL ENDOSCOPY      OB History   No obstetric history on file.      Home Medications    Prior to Admission medications   Medication Sig Start Date End Date Taking? Authorizing Provider  alendronate (FOSAMAX) 70 MG tablet TAKE ONE TABLET BY MOUTH EACH WEEK, ON AN EMPTY STOMACH BEFORE BREAKFAST WITH 8oz OF WATER AND REMAIN UPRIGHT FOR :30 09/17/17   [provider]  aspirin EC 81 MG tablet Take 81 mg by mouth daily.     [provider]  cetirizine (ZYRTEC) 10 MG tablet Take 10 mg by mouth daily.     [provider]  colchicine 0.6 MG tablet 1 tab po bid prn 06/28/15   [provider]  Docusate Calcium (STOOL SOFTENER PO) Take 1 capsule by mouth daily.    [provider]  ferrous sulfate 325 (65 FE) MG EC tablet Take 325 mg by mouth daily.  04/27/14 03/20/67  [provider]  gabapentin (NEURONTIN) 600 MG tablet Take 600 mg by mouth 3 (three) times daily.    [provider]  glipiZIDE (GLUCOTROL) 10 MG tablet Take 10 mg by mouth 2 (two) times daily.  12/10/14   [provider]  hydrochlorothiazide (HYDRODIURIL) 25 MG tablet Take 25 mg by mouth at bedtime.  02/25/15   [provider]    levothyroxine (SYNTHROID, LEVOTHROID) 150 MCG tablet Take 150 mcg by mouth daily before breakfast.  02/25/15   [provider]  meclizine (ANTIVERT) 25 MG tablet Take 1 tablet (25 mg total) by mouth 3 (three) times daily as needed for dizziness. 05/31/18   Coral Spikes, DO  metFORMIN (GLUCOPHAGE) 1000 MG tablet Take 1,000 mg by mouth 2 (two) times daily.  12/10/14   [provider]  primidone (MYSOLINE) 50 MG tablet 50 mg at bedtime.  08/22/16   [provider]  PROAIR HFA 108 (90 BASE) MCG/ACT inhaler Inhale 2 Inhalers into the lungs every 6 (six) hours as needed for wheezing or shortness of breath.  12/03/14   [provider]  sertraline (ZOLOFT) 100 MG tablet Take 100 mg by mouth daily.    [provider]  tiZANidine (ZANAFLEX) 2 MG tablet TAKE 1 TABLET BY MOUTH 3 TIMES DAILY AS NEEDED FOR MUSCLE SPASMS 12/11/17   [provider]  traMADol (ULTRAM) 50 MG tablet Take 1 tablet (50 mg total) by mouth every 6 (six) hours as needed. 03/25/18   Norval Gable, MD  valsartan (DIOVAN) 160 MG tablet Take 160 mg by mouth daily.  12/15/14   [provider]  vitamin B-12 (CYANOCOBALAMIN) 1000 MCG tablet Take 1,000 mcg by mouth daily.    [provider]  vitamin C (ASCORBIC ACID) 500 MG tablet Take 500 mg by mouth daily.    [provider]  Vitamin D, Ergocalciferol, (DRISDOL) 50000 UNITS CAPS capsule Take 50,000 Units by mouth every 7 (seven) days. Tuesday 01/04/15   [provider]    Family History Family History  Problem Relation Age of Onset  . Diabetes Mellitus II Father   . Lung disease Father   . Diabetes Mellitus II Mother   . Arthritis Mother   . Thyroid disease Other   . Asthma Sister   . Emphysema Sister   . Hypertension Sister   . Breast cancer Paternal Aunt     Social History Social History   Tobacco Use  . Smoking status: Current Every Day Smoker    Packs/day: 0.50    Years: 40.00    Pack years:  20.00    Types: Cigarettes  . Smokeless tobacco: Never Used  . Tobacco comment: curently smokes 12 cigerattes a day. I'm trying to cut back.  Substance Use Topics  . Alcohol use: No    Alcohol/week: 0.0 standard drinks  . Drug use: No     Allergies   Nicotine polacrilex; Nicotrol [nicotine]; and Codeine sulfate   Review of Systems Review of Systems  Respiratory: Positive for cough.   Neurological: Positive for dizziness.   Physical Exam  Triage Vital Signs ED Triage Vitals  Enc Vitals Group     BP 05/31/18 1509 (!) 153/93     Pulse Rate 05/31/18 1509 90     Resp 05/31/18 1509 16     Temp 05/31/18 1509 98 F (36.7 C)     Temp Source 05/31/18 1509 Oral     SpO2 05/31/18 1509 92 %     Weight 05/31/18 1508 154 lb (69.9 kg)     Height 05/31/18 1508 5\' 4"  (1.626 m)     Head Circumference --      Peak Flow --      Pain Score 05/31/18 1507 0     Pain Loc --      Pain Edu? --      Excl. in [place]? --    Updated Vital Signs BP (!) 153/93 (BP Location: Left Arm)   Pulse 90   Temp 98 F (36.7 C) (Oral)   Resp 16   Ht 5\' 4"  (1.626 m)   Wt 69.9 kg   SpO2 92%   BMI 26.43 kg/m   Visual Acuity Right Eye Distance:   Left Eye Distance:   Bilateral Distance:    Right Eye Near:   Left Eye Near:    Bilateral Near:     Physical Exam Vitals signs and nursing note reviewed.  Constitutional:      General: She is not in acute distress.    Appearance: Normal appearance.  HENT:     Head: Normocephalic and atraumatic.  Eyes:     General:        Right eye: No discharge.        Left eye: No discharge.     Conjunctiva/sclera: Conjunctivae normal.  Cardiovascular:     Rate and Rhythm: Normal rate and regular rhythm.  Pulmonary:     Effort: Pulmonary effort is normal.     Breath sounds: Normal breath sounds.  Neurological:     Mental Status: She is alert and oriented to person, place, and time.     Comments: No apparent deficits.  Psychiatric:        Mood and Affect: Mood  normal.        Behavior: Behavior normal.      UC Treatments / Results  Labs (all labs ordered are listed, but only abnormal results are displayed) Labs Reviewed - No data to display  EKG None  Radiology No results found.  Procedures Procedures (including critical care time)  Medications Ordered in UC Medications - No data to display  Initial Impression / Assessment and Plan / UC Course  I have reviewed the triage vital signs and the nursing notes.  Pertinent labs & imaging results that were available during my care of the patient were reviewed by me and considered in my medical decision making (see chart for details).    73 year old female presents with dizziness.  Benign exam. Likely multifactorial - medications, deconditioning/inactivity, etc. Meclizine as needed. Supportive care.  Final Clinical Impressions(s) / UC Diagnoses   Final diagnoses:  Dizziness   Discharge Instructions   None    ED Prescriptions    Medication Sig Dispense Auth. Provider   meclizine (ANTIVERT) 25 MG tablet Take 1 tablet (25 mg total) by mouth 3 (three) times daily as needed for dizziness. 30 tablet Coral Spikes, DO     Controlled Substance Prescriptions [place] Controlled Substance Registry consulted? Not Applicable   Coral Spikes, DO 05/31/18 2119

## 2018-05-31 NOTE — ED Triage Notes (Signed)
Patient c/o cough for 2 weeks.  Patient c/o dizziness that started this morning.

## 2018-07-01 ENCOUNTER — Inpatient Hospital Stay: Payer: Medicare Other | Attending: Urgent Care

## 2018-09-23 ENCOUNTER — Encounter (INDEPENDENT_AMBULATORY_CARE_PROVIDER_SITE_OTHER): Payer: Medicare Other | Admitting: Vascular Surgery

## 2018-09-26 ENCOUNTER — Other Ambulatory Visit: Payer: Self-pay

## 2018-09-26 ENCOUNTER — Encounter (INDEPENDENT_AMBULATORY_CARE_PROVIDER_SITE_OTHER): Payer: Self-pay | Admitting: Vascular Surgery

## 2018-09-26 ENCOUNTER — Ambulatory Visit (INDEPENDENT_AMBULATORY_CARE_PROVIDER_SITE_OTHER): Payer: Medicare Other | Admitting: Vascular Surgery

## 2018-09-26 VITALS — BP 180/92 | HR 91 | Resp 16 | Ht 65.0 in | Wt 154.0 lb

## 2018-09-26 DIAGNOSIS — E119 Type 2 diabetes mellitus without complications: Secondary | ICD-10-CM

## 2018-09-26 DIAGNOSIS — M79605 Pain in left leg: Secondary | ICD-10-CM | POA: Diagnosis not present

## 2018-09-26 DIAGNOSIS — M79604 Pain in right leg: Secondary | ICD-10-CM | POA: Diagnosis not present

## 2018-09-26 DIAGNOSIS — R600 Localized edema: Secondary | ICD-10-CM

## 2018-09-26 DIAGNOSIS — M79609 Pain in unspecified limb: Secondary | ICD-10-CM | POA: Insufficient documentation

## 2018-09-26 DIAGNOSIS — Z7984 Long term (current) use of oral hypoglycemic drugs: Secondary | ICD-10-CM

## 2018-09-26 DIAGNOSIS — I10 Essential (primary) hypertension: Secondary | ICD-10-CM | POA: Diagnosis not present

## 2018-09-26 DIAGNOSIS — M7989 Other specified soft tissue disorders: Secondary | ICD-10-CM

## 2018-09-26 DIAGNOSIS — F17210 Nicotine dependence, cigarettes, uncomplicated: Secondary | ICD-10-CM

## 2018-09-26 DIAGNOSIS — Z79899 Other long term (current) drug therapy: Secondary | ICD-10-CM

## 2018-09-26 NOTE — Progress Notes (Signed)
Patient ID: Martha [name], female   DOB: 1945-12-23, 73 y.o.   MRN: [id number]  Chief Complaint  Patient presents with   New Patient (Initial Visit)    ref [facility] for abnormal ABI    HPI Martha [name] is a 73 y.o. female.  I am asked to see the patient by Dr. Ginette Pitman for evaluation of abnormal ABIs and leg.  She had ABIs done that likely were increased due to medial calcification but had normal numbers of 0.97 on the right and 1.04 on the left.  She has longstanding diabetes and tobacco use.  She complains of a lot of pain and swelling in both legs.  The left leg is the more severely affected of the 2 legs.  She brings her study in today and she actually has strong biphasic waveforms distally in both lower extremities with his ABIs.  The leg swelling is more pronounced as the day goes on.  Her ambulation and to video are far more limited than the used to be and she is now in a wheelchair.  Her legs are dependent much of the time.  No open ulcerations or infection.  No fever or chills.   Past Medical History:  Diagnosis Date   Abnormal LFTs (liver function tests)    Anxiety    Arthritis    Asthma    Chronic bronchitis (HCC)    Chronic constipation    Chronic low back pain    Cigarette smoker    Has cut back Smoking to 1 pack every other day   COPD (chronic obstructive pulmonary disease) ([place])    Depression    Diabetic neuropathy ([place])    Diverticulitis 2015   gi recommended repeat scope in 10 years   Essential hypertension    Fatty liver    GERD (gastroesophageal reflux disease)    Gout    History of mammogram 05/28/2013   Hyperlipidemia    Hypothyroidism    Insomnia    Iron deficiency anemia    Low serum vitamin D    Personal history of tobacco use, presenting hazards to health 09/29/2015   Plantar fasciitis    RLS (restless legs syndrome)    Shortness of breath dyspnea    Thyroid cancer ([place]) 2009   Tremors of nervous system    "I think I  have parkinson's disease"   Type 2 diabetes mellitus ([place])     Past Surgical History:  Procedure Laterality Date   CATARACT EXTRACTION W/PHACO Left 11/24/2015   Procedure: CATARACT EXTRACTION PHACO AND INTRAOCULAR LENS PLACEMENT ([place]);  Surgeon: Birder Robson, MD;  Location: [facility] ORS;  Service: Ophthalmology;  Laterality: Left;  [place] 38.3 AP% 20.6 CDE 7.89 FLUID PACK LOT # [id number] H   COLONOSCOPY  2004, 2009, 2015   [place]   POLYPECTOMY  2009   THYROIDECTOMY  2009   states she was tx with radiactive iodine   TUBAL LIGATION     UPPER GASTROINTESTINAL ENDOSCOPY      Family History Family History  Problem Relation Age of Onset   Diabetes Mellitus II Father    Lung disease Father    Diabetes Mellitus II Mother    Arthritis Mother    Thyroid disease Other    Asthma Sister    Emphysema Sister    Hypertension Sister    Breast cancer Paternal Aunt     Social History Social History   Tobacco Use   Smoking status: Current Every Day Smoker    Packs/day:  0.50    Years: 40.00    Pack years: 20.00    Types: Cigarettes   Smokeless tobacco: Never Used   Tobacco comment: curently smokes 12 cigerattes a day. I'm trying to cut back.  Substance Use Topics   Alcohol use: No    Alcohol/week: 0.0 standard drinks   Drug use: No    Allergies  Allergen Reactions   Nicotine Polacrilex Cough    Onset 04/29/2006.   Nicotrol [Nicotine] Cough   Codeine Sulfate Itching    Onset 11/24/1998. tingling    Current Outpatient Medications  Medication Sig Dispense Refill   alendronate (FOSAMAX) 70 MG tablet TAKE ONE TABLET BY MOUTH EACH WEEK, ON AN EMPTY STOMACH BEFORE BREAKFAST WITH 8oz OF WATER AND REMAIN UPRIGHT FOR :30     aspirin EC 81 MG tablet Take 81 mg by mouth daily.      cetirizine (ZYRTEC) 10 MG tablet Take 10 mg by mouth daily.      colchicine 0.6 MG tablet 1 tab po bid prn     Docusate Calcium (STOOL SOFTENER PO) Take 1 capsule by mouth daily.      ferrous sulfate 325 (65 FE) MG EC tablet Take 325 mg by mouth daily.      gabapentin (NEURONTIN) 600 MG tablet Take 600 mg by mouth 3 (three) times daily.     glipiZIDE (GLUCOTROL) 10 MG tablet Take 10 mg by mouth 2 (two) times daily.      hydrochlorothiazide (HYDRODIURIL) 25 MG tablet Take 25 mg by mouth at bedtime.      levothyroxine (SYNTHROID, LEVOTHROID) 150 MCG tablet Take 150 mcg by mouth daily before breakfast.      meclizine (ANTIVERT) 25 MG tablet Take 1 tablet (25 mg total) by mouth 3 (three) times daily as needed for dizziness. 30 tablet 0   metFORMIN (GLUCOPHAGE) 1000 MG tablet Take 1,000 mg by mouth 2 (two) times daily.      primidone (MYSOLINE) 50 MG tablet 50 mg at bedtime.      PROAIR HFA 108 (90 BASE) MCG/ACT inhaler Inhale 2 Inhalers into the lungs every 6 (six) hours as needed for wheezing or shortness of breath.      sertraline (ZOLOFT) 100 MG tablet Take 100 mg by mouth daily.     tiZANidine (ZANAFLEX) 2 MG tablet TAKE 1 TABLET BY MOUTH 3 TIMES DAILY AS NEEDED FOR MUSCLE SPASMS     traMADol (ULTRAM) 50 MG tablet Take 1 tablet (50 mg total) by mouth every 6 (six) hours as needed. 30 tablet 0   valsartan (DIOVAN) 160 MG tablet Take 160 mg by mouth daily.      vitamin B-12 (CYANOCOBALAMIN) 1000 MCG tablet Take 1,000 mcg by mouth daily.     vitamin C (ASCORBIC ACID) 500 MG tablet Take 500 mg by mouth daily.     Vitamin D, Ergocalciferol, (DRISDOL) 50000 UNITS CAPS capsule Take 50,000 Units by mouth every 7 (seven) days. Tuesday     No current facility-administered medications for this visit.       REVIEW OF SYSTEMS (Negative unless checked)  Constitutional: [] Weight loss  [] Fever  [] Chills Cardiac: [] Chest pain   [] Chest pressure   [] Palpitations   [] Shortness of breath when laying flat   [] Shortness of breath at rest   [x] Shortness of breath with exertion. Vascular:  [] Pain in legs with walking   [] Pain in legs at rest   [] Pain in legs when laying flat    [] Claudication   [] Pain in feet  when walking  [] Pain in feet at rest  [] Pain in feet when laying flat   [] History of DVT   [] Phlebitis   [] Swelling in legs   [] Varicose veins   [] Non-healing ulcers Pulmonary:   [] Uses home oxygen   [] Productive cough   [] Hemoptysis   [] Wheeze  [x] COPD   [x] Asthma Neurologic:  [] Dizziness  [] Blackouts   [] Seizures   [] History of stroke   [] History of TIA  [] Aphasia   [] Temporary blindness   [] Dysphagia   [] Weakness or numbness in arms   [] Weakness or numbness in legs Musculoskeletal:  [x] Arthritis   [] Joint swelling   [] Joint pain   [] Low back pain Hematologic:  [] Easy bruising  [] Easy bleeding   [] Hypercoagulable state   [x] Anemic  [] Hepatitis Gastrointestinal:  [] Blood in stool   [] Vomiting blood  [x] Gastroesophageal reflux/heartburn   [] Abdominal pain Genitourinary:  [] Chronic kidney disease   [] Difficult urination  [] Frequent urination  [] Burning with urination   [] Hematuria Skin:  [] Rashes   [] Ulcers   [] Wounds Psychological:  [x] History of anxiety   []  History of major depression.    Physical Exam BP (!) 180/92 (BP Location: Right Arm)    Pulse 91    Resp 16    Ht 5\' 5"  (1.651 m)    Wt 154 lb (69.9 kg)    BMI 25.63 kg/m  Gen:  WD/WN, NAD Head: [place]/AT, No temporalis wasting.  Ear/Nose/Throat: Hearing grossly intact, nares w/o erythema or drainage, oropharynx w/o Erythema/Exudate Eyes: Conjunctiva clear, sclera non-icteric  Neck: trachea midline.  No JVD.  Pulmonary:  Good air movement, respirations not labored, no use of accessory muscles  Cardiac: RRR, no JVD Vascular:  Vessel Right Left  Radial Palpable Palpable                          PT  1+  trace  DP  2+  2+   Gastrointestinal:. No masses, surgical incisions, or scars. Musculoskeletal: M/S 5/5 throughout.  Extremities without ischemic changes.  No deformity or atrophy.  Trace right lower extremity edema, 1-2+ left lower extremity edema.  Uses a wheelchair. Neurologic: Sensation grossly  intact in extremities.  Symmetrical.  Speech is fluent. Motor exam as listed above. Psychiatric: Judgment intact, Mood & affect appropriate for pt's clinical situation. Dermatologic: No rashes or ulcers noted.  No cellulitis or open wounds.    Radiology Imaging Results - documented in this encounter  CARD Lower Extremity ABI/PVR Complete at Rest (08/22/2018 11:02 AM EDT) CARD Lower Extremity ABI/PVR Complete at Rest (08/22/2018 11:02 AM EDT)  Specimen     CARD Lower Extremity ABI/PVR Complete at Rest (08/22/2018 11:02 AM EDT)  Narrative Performed At  [facility]    Lower extremity arterial evaluation.     SONOGRAPHER: Avanell Shackleton, RCS  ORDERING: Tracie Harrier, MD  READING: Tracie Harrier, MD    Indications: Bilateral Leg Pain    History: Leg weakness, Smoker, HTN, DM    Study Information:    Study status: Routine.   Procedure: A vascular evaluation was performed. Image quality was good.   ABI. Segmental pressure measurements, ankle-brachial indices, and   photoplethysmography.   Location: Internal Medicine.     Brachial pressures:    Right Left Max    Systolic [id number]      Arterial pressure indices:    Location  Pressure Brachial Index Waveform    Right dorsalis pedis 179 mmHg 0.92  Biphasic  Right posterior tibial 189 mmHg  0.97  Biphasic  Left dorsalis pedis 171 mmHg 0.88  Biphasic  Left posterior tibial 201 mmHg 1.04  Biphasic       Summary:    Right Ankle-Brachial Index: 0.97  Left Ankle-Brachial Index: 1.04    Normal Ankle-Brachial Indecis bilaterally        Signed: Tracie Harrier, MD        Labs No results found for this or any previous visit (from the past 2160 hour(s)).  Assessment/Plan:  BP (high blood pressure) blood pressure control important in reducing the progression of atherosclerotic disease. On appropriate oral  medications.   Type 2 diabetes mellitus (HCC) blood glucose control important in reducing the progression of atherosclerotic disease. Also, involved in wound healing. On appropriate medications.   Pain in limb Noninvasive studies show that her perfusion is intact although there is likely some medial calcification and some degree of peripheral arterial disease.  A venous work-up will be performed to help evaluate her leg swelling and pain.  Recommend compression stockings, elevation, and increasing her activity.  Swelling of limb I have had a long discussion with the patient regarding swelling and why it  causes symptoms.  Patient will begin wearing graduated compression stockings class 1 (20-30 mmHg) on a daily basis a prescription was given. The patient will  beginning wearing the stockings first thing in the morning and removing them in the evening. The patient is instructed specifically not to sleep in the stockings.   In addition, behavioral modification will be initiated.  This will include frequent elevation, use of over the counter pain medications and exercise such as walking.  I have reviewed systemic causes for chronic edema such as liver, kidney and cardiac etiologies.  The patient denies problems with these organ systems.    Consideration for a lymph pump will also be made based upon the effectiveness of conservative therapy.  This would help to improve the edema control and prevent sequela such as ulcers and infections   Patient should undergo duplex ultrasound of the venous system to ensure that DVT or reflux is not present.  The patient will follow-up with me after the ultrasound.        Leotis Pain 09/26/2018, 12:24 PM   This note was created with Dragon medical transcription system.  Any errors from dictation are unintentional.

## 2018-09-26 NOTE — Patient Instructions (Signed)

## 2018-09-26 NOTE — Assessment & Plan Note (Signed)
Noninvasive studies show that her perfusion is intact although there is likely some medial calcification and some degree of peripheral arterial disease.  A venous work-up will be performed to help evaluate her leg swelling and pain.  Recommend compression stockings, elevation, and increasing her activity.

## 2018-09-26 NOTE — Assessment & Plan Note (Signed)
blood glucose control important in reducing the progression of atherosclerotic disease. Also, involved in wound healing. On appropriate medications.  

## 2018-09-26 NOTE — Assessment & Plan Note (Signed)
blood pressure control important in reducing the progression of atherosclerotic disease. On appropriate oral medications.  

## 2018-09-26 NOTE — Assessment & Plan Note (Signed)

## 2018-09-30 ENCOUNTER — Other Ambulatory Visit: Payer: Medicare Other

## 2018-10-01 ENCOUNTER — Ambulatory Visit: Payer: Medicare Other | Admitting: Hematology and Oncology

## 2018-10-10 ENCOUNTER — Encounter (INDEPENDENT_AMBULATORY_CARE_PROVIDER_SITE_OTHER): Payer: Self-pay | Admitting: Nurse Practitioner

## 2018-10-10 ENCOUNTER — Other Ambulatory Visit: Payer: Self-pay

## 2018-10-10 ENCOUNTER — Ambulatory Visit (INDEPENDENT_AMBULATORY_CARE_PROVIDER_SITE_OTHER): Payer: Medicare Other

## 2018-10-10 ENCOUNTER — Ambulatory Visit (INDEPENDENT_AMBULATORY_CARE_PROVIDER_SITE_OTHER): Payer: Medicare Other | Admitting: Nurse Practitioner

## 2018-10-10 VITALS — BP 147/83 | HR 93 | Resp 16 | Wt 152.0 lb

## 2018-10-10 DIAGNOSIS — M7989 Other specified soft tissue disorders: Secondary | ICD-10-CM

## 2018-10-10 DIAGNOSIS — M79604 Pain in right leg: Secondary | ICD-10-CM | POA: Diagnosis not present

## 2018-10-10 DIAGNOSIS — I890 Lymphedema, not elsewhere classified: Secondary | ICD-10-CM | POA: Diagnosis not present

## 2018-10-10 DIAGNOSIS — I872 Venous insufficiency (chronic) (peripheral): Secondary | ICD-10-CM

## 2018-10-10 DIAGNOSIS — Z720 Tobacco use: Secondary | ICD-10-CM | POA: Diagnosis not present

## 2018-10-10 DIAGNOSIS — M79605 Pain in left leg: Secondary | ICD-10-CM

## 2018-10-10 DIAGNOSIS — I739 Peripheral vascular disease, unspecified: Secondary | ICD-10-CM

## 2018-10-10 DIAGNOSIS — R600 Localized edema: Secondary | ICD-10-CM

## 2018-10-10 NOTE — Progress Notes (Signed)
SUBJECTIVE:  Patient ID: Martha [name], female    DOB: [birth year], 73 y.o.   MRN: [id number] Chief Complaint  Patient presents with   Follow-up    ultrasound follow up    HPI  Martha [name] is a 73 y.o. female that presents today for follow-up studies regarding lower extremity leg swelling.  The patient states that her leg swelling is more pronounced as the day goes on.  The patient is limited to utilizing wheelchair on a daily basis.  Her legs are dependent for a great period of time.  She is also had a fracture of her left lower extremity ankle and after that time it swells much more than the right.  She has no open ulcerations or wounds.  She denies any fever, chills nausea or vomiting.  Her main concern however continues to be the pain in her bilateral lower extremities.  The patient is a current smoker.  Today the patient's noninvasive studies show that she has reflux of the common femoral vein in the left lower extremity as well as reflux within the popliteal and great saphenous vein of the right.  There is no evidence of DVT or superficial venous thrombosis bilaterally.  Past Medical History:  Diagnosis Date   Abnormal LFTs (liver function tests)    Anxiety    Arthritis    Asthma    Chronic bronchitis (HCC)    Chronic constipation    Chronic low back pain    Cigarette smoker    Has cut back Smoking to 1 pack every other day   COPD (chronic obstructive pulmonary disease) ([place])    Depression    Diabetic neuropathy ([place])    Diverticulitis 2015   gi recommended repeat scope in 10 years   Essential hypertension    Fatty liver    GERD (gastroesophageal reflux disease)    Gout    History of mammogram 05/28/2013   Hyperlipidemia    Hypothyroidism    Insomnia    Iron deficiency anemia    Low serum vitamin D    Personal history of tobacco use, presenting hazards to health 09/29/2015   Plantar fasciitis    RLS (restless legs syndrome)     Shortness of breath dyspnea    Thyroid cancer ([place]) 2009   Tremors of nervous system    "I think I have parkinson's disease"   Type 2 diabetes mellitus ([place])     Past Surgical History:  Procedure Laterality Date   CATARACT EXTRACTION W/PHACO Left 11/24/2015   Procedure: CATARACT EXTRACTION PHACO AND INTRAOCULAR LENS PLACEMENT ([place]);  Surgeon: Birder Robson, MD;  Location: [facility] ORS;  Service: Ophthalmology;  Laterality: Left;  [place] 38.3 AP% 20.6 CDE 7.89 FLUID PACK LOT # [id number] H   COLONOSCOPY  2004, 2009, 2015   [place]   POLYPECTOMY  2009   THYROIDECTOMY  2009   states she was tx with radiactive iodine   TUBAL LIGATION     UPPER GASTROINTESTINAL ENDOSCOPY      Social History   Socioeconomic History   Marital status: Divorced    Spouse name: Not on file   Number of children: Not on file   Years of education: Not on file   Highest education level: Not on file  Occupational History   Not on file  Social Needs   Financial resource strain: Not on file   Food insecurity    Worry: Not on file    Inability: Not on file   Transportation needs  Medical: Not on file    Non-medical: Not on file  Tobacco Use   Smoking status: Current Every Day Smoker    Packs/day: 0.50    Years: 40.00    Pack years: 20.00    Types: Cigarettes   Smokeless tobacco: Never Used   Tobacco comment: curently smokes 12 cigerattes a day. I'm trying to cut back.  Substance and Sexual Activity   Alcohol use: No    Alcohol/week: 0.0 standard drinks   Drug use: No   Sexual activity: Never  Lifestyle   Physical activity    Days per week: Not on file    Minutes per session: Not on file   Stress: Not on file  Relationships   Social connections    Talks on phone: Not on file    Gets together: Not on file    Attends religious service: Not on file    Active member of club or organization: Not on file    Attends meetings of clubs or organizations: Not on file     Relationship status: Not on file   Intimate partner violence    Fear of current or ex partner: Not on file    Emotionally abused: Not on file    Physically abused: Not on file    Forced sexual activity: Not on file  Other Topics Concern   Not on file  Social History Narrative   Not on file    Family History  Problem Relation Age of Onset   Diabetes Mellitus II Father    Lung disease Father    Diabetes Mellitus II Mother    Arthritis Mother    Thyroid disease Other    Asthma Sister    Emphysema Sister    Hypertension Sister    Breast cancer Paternal Aunt     Allergies  Allergen Reactions   Nicotine Polacrilex Cough    Onset 04/29/2006.   Nicotrol [Nicotine] Cough   Codeine Sulfate Itching    Onset 11/24/1998. tingling     Review of Systems   Review of Systems: Negative Unless Checked Constitutional: [] Weight loss  [] Fever  [] Chills Cardiac: [] Chest pain   []  Atrial Fibrillation  [] Palpitations   [] Shortness of breath when laying flat   [x] Shortness of breath with exertion. [] Shortness of breath at rest Vascular:  [] Pain in legs with walking   [] Pain in legs with standing [] Pain in legs when laying flat   [] Claudication    [x] Pain in feet when laying flat    [] History of DVT   [] Phlebitis   [x] Swelling in legs   [] Varicose veins   [] Non-healing ulcers Pulmonary:   [] Uses home oxygen   [] Productive cough   [] Hemoptysis   [] Wheeze  [x] COPD   [x] Asthma Neurologic:  [] Dizziness   [] Seizures  [] Blackouts [] History of stroke   [] History of TIA  [] Aphasia   [] Temporary Blindness   [] Weakness or numbness in arm   [] Weakness or numbness in leg Musculoskeletal:   [] Joint swelling   [] Joint pain   [] Low back pain  []  History of Knee Replacement [x] Arthritis [] back Surgeries  []  Spinal Stenosis    Hematologic:  [] Easy bruising  [] Easy bleeding   [] Hypercoagulable state   [x] Anemic Gastrointestinal:  [] Diarrhea   [] Vomiting  [] Gastroesophageal reflux/heartburn    [] Difficulty swallowing. [] Abdominal pain Genitourinary:  [] Chronic kidney disease   [] Difficult urination  [] Anuric   [] Blood in urine [] Frequent urination  [] Burning with urination   [] Hematuria Skin:  [] Rashes   [] Ulcers [] Wounds Psychological:  [  x]History of anxiety   []  History of major depression  [x]  Memory Difficulties      OBJECTIVE:   Physical Exam  BP (!) 147/83 (BP Location: Left Arm)    Pulse 93    Resp 16    Wt 152 lb (68.9 kg)    BMI 25.29 kg/m   Gen: WD/WN, NAD Head: [place]/AT, No temporalis wasting.  Ear/Nose/Throat: Hearing grossly intact, nares w/o erythema or drainage Eyes: PER, EOMI, sclera nonicteric.  Neck: Supple, no masses.  No JVD.  Pulmonary:  Good air movement, no use of accessory muscles.  Cardiac: RRR Vascular:  2+ left lower extremity edema, 1+ right lower extremity Vessel Right Left  Radial Palpable Palpable  Dorsalis Pedis Palpable Palpable  Posterior Tibial Palpable Palpable   Gastrointestinal: soft, non-distended. No guarding/no peritoneal signs.  Musculoskeletal: Wheelchair-bound No deformity or atrophy.  Neurologic: Pain and light touch intact in extremities.  Symmetrical.  Speech is fluent. Motor exam as listed above..  Tremors Psychiatric: Judgment intact, Mood & affect appropriate for pt's clinical situation. Dermatologic: No Venous rashes. No Ulcers Noted.  No changes consistent with cellulitis. Lymph : No Cervical lymphadenopathy, no lichenification or skin changes of chronic lymphedema.       ASSESSMENT AND PLAN:  1. Peripheral artery disease ([place]) Patient does have some evidence of peripheral arterial disease as evidenced by the ABIs done at Dr. Linton Ham office.  However, the degree of peripheral artery disease that she has should not explain the pain that she had her lower extremities, especially due to the fact that this pain is been ongoing for several years.  Given the patient's multiple risk factors for peripheral arterial disease,  as well as current smoking status we will continue to follow the patient on a regular basis for peripheral artery disease.  The patient should follow-up in 6 months with noninvasive studies as ordered. - VAS [place] ABI WITH/WO TBI; Future - VAS [place] LOWER EXTREMITY ARTERIAL DUPLEX; Future  2. Current tobacco use Smoking cessation was discussed, 3-10 minutes spent on this topic specifically   3. Chronic venous insufficiency Recommend:  The patient is complaining of varicose veins.    I have had a long discussion with the patient regarding  varicose veins and why they cause symptoms.  Patient will begin wearing graduated compression stockings on a daily basis, beginning first thing in the morning and removing them in the evening. The patient is instructed specifically not to sleep in the stockings.    The patient  will also begin using over-the-counter analgesics such as Motrin 600 mg po TID to help control the symptoms as needed.    In addition, behavioral modification including elevation during the day will be initiated, utilizing a recliner was recommended.    We will discuss conservative therapy measures with the patient when she returns in 6 months.  4. Lymphedema See above, depending on conservative therapy measures we may discuss lymph pump once again.    Current Outpatient Medications on File Prior to Visit  Medication Sig Dispense Refill   alendronate (FOSAMAX) 70 MG tablet TAKE ONE TABLET BY MOUTH EACH WEEK, ON AN EMPTY STOMACH BEFORE BREAKFAST WITH 8oz OF WATER AND REMAIN UPRIGHT FOR :30     aspirin EC 81 MG tablet Take 81 mg by mouth daily.      cetirizine (ZYRTEC) 10 MG tablet Take 10 mg by mouth daily.      colchicine 0.6 MG tablet 1 tab po bid prn     Docusate  Calcium (STOOL SOFTENER PO) Take 1 capsule by mouth daily.     ferrous sulfate 325 (65 FE) MG EC tablet Take 325 mg by mouth daily.      gabapentin (NEURONTIN) 600 MG tablet Take 600 mg by mouth 3 (three) times  daily.     glipiZIDE (GLUCOTROL) 10 MG tablet Take 10 mg by mouth 2 (two) times daily.      hydrochlorothiazide (HYDRODIURIL) 25 MG tablet Take 25 mg by mouth at bedtime.      levothyroxine (SYNTHROID, LEVOTHROID) 150 MCG tablet Take 150 mcg by mouth daily before breakfast.      meclizine (ANTIVERT) 25 MG tablet Take 1 tablet (25 mg total) by mouth 3 (three) times daily as needed for dizziness. 30 tablet 0   metFORMIN (GLUCOPHAGE) 1000 MG tablet Take 1,000 mg by mouth 2 (two) times daily.      primidone (MYSOLINE) 50 MG tablet 50 mg at bedtime.      PROAIR HFA 108 (90 BASE) MCG/ACT inhaler Inhale 2 Inhalers into the lungs every 6 (six) hours as needed for wheezing or shortness of breath.      sertraline (ZOLOFT) 100 MG tablet Take 100 mg by mouth daily.     tiZANidine (ZANAFLEX) 2 MG tablet TAKE 1 TABLET BY MOUTH 3 TIMES DAILY AS NEEDED FOR MUSCLE SPASMS     traMADol (ULTRAM) 50 MG tablet Take 1 tablet (50 mg total) by mouth every 6 (six) hours as needed. 30 tablet 0   valsartan (DIOVAN) 160 MG tablet Take 160 mg by mouth daily.      vitamin B-12 (CYANOCOBALAMIN) 1000 MCG tablet Take 1,000 mcg by mouth daily.     vitamin C (ASCORBIC ACID) 500 MG tablet Take 500 mg by mouth daily.     Vitamin D, Ergocalciferol, (DRISDOL) 50000 UNITS CAPS capsule Take 50,000 Units by mouth every 7 (seven) days. Tuesday     No current facility-administered medications on file prior to visit.     There are no Patient Instructions on file for this visit. No follow-ups on file.   Kris Hartmann, NP  This note was completed with Sales executive.  Any errors are purely unintentional.

## 2018-11-04 ENCOUNTER — Other Ambulatory Visit: Payer: Self-pay

## 2018-11-04 ENCOUNTER — Inpatient Hospital Stay: Payer: Medicare Other

## 2018-11-04 NOTE — Progress Notes (Signed)
[facility]  [address], [address] [place], [place] [address] Phone: [phone]  Fax: [phone]   Clinic Day:  11/05/2018  Referring physician: Tracie Harrier, MD  Chief Complaint: Martha [name] is a 73 y.o. female with iron deficiency anemia who is seen for new patient assessment.  HPI: The patient was diagnosed with iron deficiency anemia in 11/2013. She was initially seen in the clinic on 03/08/2015 by Dr. Rogue Bussing for worsening anemia; she had been on oral iron for 4 months. She was fatigued and had shortness of breath with exertion. Work-up showed: hemoglobin 8.5, hematocrit 27.8, MCV 69.5, platelets 322,000, LDH 121, retic 3.3%, negative for celiac, ferritin 15.    EGD and colonoscopy on 01/25/2014 revealed no blood loss to explain anemia.   Abdomen and pelvis CT on 08/27/2014 showed fatty infiltration of liver without focal liver lesion. Spleen upper normal in size. There was nodular mild enlargement left adrenal gland likely due to adenoma.  There was a 10 x 15 mm lipoma second portion of the duodenum has a benign appearance. There was sigmoid diverticulosis.  Ferritin has been followed: 15 on 03/08/2015, 31 on 05/06/2015, 25 on 09/06/2015, 14 on 03/13/2016, 14 on 09/11/2016, 43 on 03/12/2017, 14 on 09/17/2017, and 22 on 03/25/2018.   She has received Feraheme x2 (03/15/2015 - 03/22/2015), 05/17/2015, 09/13/2015, 05/21/2015, and x2 (09/18/2016 - 09/26/2016).     She is followed in the low dose chest CT lung cancer screening program. Chest CT on 12/13/2017 revealed Lung-RADS 2, benign appearance or behavior. Annual follow-up was recommended.   She has a history of thyroid cancer.  She is s/p thyroidectomy and radioactive iodine in 09/2007. She is followed by Dr. Ginette Pitman.   The patient was last seen in the hematology clinic on 04/11/2018.  At that time, she continued to smoke. She had hip pain for which she was on prednisone. She had stable, chronic  shortness of breath and tremors. Hematocrit was 46.8, hemoglobin 15.8, and MCV 88.6.  Ferritin was 22 with an iron saturation of 15%.  She continued on oral iron.   During the interim, she is doing "okay." She is a poor historian. She denies any fevers, sweats, or weight loss. She denies any headaches or vision changes. She has a cough. She denies any blood in her stools or black stools. She has joint pain in her knees.   She eats what she wants, but only consumes meat every few days. She takes oral iron once daily.   She continues to smoke about 1/2 ppd, but is trying to cut back. Bp is 97/65 in the clinic today with pulse of 85. She denies any symptoms or new medications.    Past Medical History:  Diagnosis Date  . Abnormal LFTs (liver function tests)   . Anxiety   . Arthritis   . Asthma   . Chronic bronchitis ([place])   . Chronic constipation   . Chronic low back pain   . Cigarette smoker    Has cut back Smoking to 1 pack every other day  . COPD (chronic obstructive pulmonary disease) ([place])   . Depression   . Diabetic neuropathy ([place])   . Diverticulitis 2015   gi recommended repeat scope in 10 years  . Essential hypertension   . Fatty liver   . GERD (gastroesophageal reflux disease)   . Gout   . History of mammogram 05/28/2013  . Hyperlipidemia   . Hypothyroidism   . Insomnia   . Iron  deficiency anemia   . Low serum vitamin D   . Personal history of tobacco use, presenting hazards to health 09/29/2015  . Plantar fasciitis   . RLS (restless legs syndrome)   . Shortness of breath dyspnea   . Thyroid cancer ([place]) 2009  . Tremors of nervous system    "I think I have parkinson's disease"  . Type 2 diabetes mellitus ([place])     Past Surgical History:  Procedure Laterality Date  . CATARACT EXTRACTION W/PHACO Left 11/24/2015   Procedure: CATARACT EXTRACTION PHACO AND INTRAOCULAR LENS PLACEMENT (IOC);  Surgeon: Birder Robson, MD;  Location: [facility] ORS;  Service: Ophthalmology;   Laterality: Left;  [place] 38.3 AP% 20.6 CDE 7.89 FLUID PACK LOT # [id number]  . COLONOSCOPY  2004, 2009, 2015   Adenoma  . POLYPECTOMY  2009  . THYROIDECTOMY  2009   states she was tx with radiactive iodine  . TUBAL LIGATION    . UPPER GASTROINTESTINAL ENDOSCOPY      Family History  Problem Relation Age of Onset  . Diabetes Mellitus II Father   . Lung disease Father   . Diabetes Mellitus II Mother   . Arthritis Mother   . Thyroid disease Other   . Asthma Sister   . Emphysema Sister   . Hypertension Sister   . Breast cancer Paternal Aunt     Social History:  reports that she has been smoking cigarettes. She has a 20.00 pack-year smoking history. She has never used smokeless tobacco. She reports that she does not drink alcohol or use drugs. She is retired but she used to work at [employer]. She currently smokes 1/2 ppd. She lives in [place], [place]. The patient is alone today.  Allergies:  Allergies  Allergen Reactions  . Nicotine Polacrilex Cough    Onset 04/29/2006.  Marland Kitchen Nicotrol [Nicotine] Cough  . Codeine Sulfate Itching    Onset 11/24/1998. tingling    Current Medications: Current Outpatient Medications  Medication Sig Dispense Refill  . alendronate (FOSAMAX) 70 MG tablet TAKE ONE TABLET BY MOUTH EACH WEEK, ON AN EMPTY STOMACH BEFORE BREAKFAST WITH 8oz OF WATER AND REMAIN UPRIGHT FOR :30    . aspirin EC 81 MG tablet Take 81 mg by mouth daily.     . cetirizine (ZYRTEC) 10 MG tablet Take 10 mg by mouth daily.     . colchicine 0.6 MG tablet 1 tab po bid prn    . Docusate Calcium (STOOL SOFTENER PO) Take 1 capsule by mouth as needed.     . ferrous sulfate 325 (65 FE) MG EC tablet Take 325 mg by mouth daily.     Marland Kitchen gabapentin (NEURONTIN) 600 MG tablet Take 600 mg by mouth 3 (three) times daily.    Marland Kitchen glipiZIDE (GLUCOTROL) 10 MG tablet Take 10 mg by mouth 2 (two) times daily.     . hydrochlorothiazide (HYDRODIURIL) 25 MG tablet Take 25 mg by mouth at bedtime.     Marland Kitchen levothyroxine  (SYNTHROID, LEVOTHROID) 150 MCG tablet Take 150 mcg by mouth daily before breakfast.     . losartan (COZAAR) 50 MG tablet Take 50 mg by mouth daily.     . metFORMIN (GLUCOPHAGE) 1000 MG tablet Take 1,000 mg by mouth 2 (two) times daily.     . primidone (MYSOLINE) 50 MG tablet 50 mg at bedtime.     Marland Kitchen PROAIR HFA 108 (90 BASE) MCG/ACT inhaler Inhale 2 Inhalers into the lungs every 6 (six) hours as needed for wheezing or  shortness of breath.     . rosuvastatin (CRESTOR) 5 MG tablet Take 5 mg by mouth daily at 6 PM.     . sertraline (ZOLOFT) 100 MG tablet Take 100 mg by mouth daily.    Marland Kitchen tiZANidine (ZANAFLEX) 2 MG tablet TAKE 1 TABLET BY MOUTH 3 TIMES DAILY AS NEEDED FOR MUSCLE SPASMS    . traMADol (ULTRAM) 50 MG tablet Take 1 tablet (50 mg total) by mouth every 6 (six) hours as needed. 30 tablet 0  . valsartan (DIOVAN) 160 MG tablet Take 160 mg by mouth daily.     . vitamin B-12 (CYANOCOBALAMIN) 1000 MCG tablet Take 1,000 mcg by mouth daily.    . vitamin C (ASCORBIC ACID) 500 MG tablet Take 500 mg by mouth daily.    . Vitamin D, Ergocalciferol, (DRISDOL) 50000 UNITS CAPS capsule Take 50,000 Units by mouth every 7 (seven) days. Tuesday     No current facility-administered medications for this visit.     Review of Systems  Constitutional: Negative.  Negative for chills, diaphoresis, fever, malaise/fatigue and weight loss.       Doing "okay."  HENT: Negative.  Negative for congestion, hearing loss, sinus pain and sore throat.   Eyes: Negative.  Negative for blurred vision.  Respiratory: Positive for cough and shortness of breath. Negative for sputum production and wheezing.   Cardiovascular: Negative.  Negative for chest pain, palpitations, orthopnea, leg swelling and PND.  Gastrointestinal: Negative.  Negative for abdominal pain, blood in stool, constipation, diarrhea, melena, nausea and vomiting.  Genitourinary: Negative.  Negative for dysuria, frequency, hematuria and urgency.  Musculoskeletal:  Positive for joint pain (knees). Negative for back pain and myalgias.  Skin: Negative.  Negative for rash.  Neurological: Positive for tremors (chronic). Negative for dizziness, sensory change, weakness and headaches.  Endo/Heme/Allergies: Negative.  Does not bruise/bleed easily.  Psychiatric/Behavioral: Positive for memory loss. Negative for depression and substance abuse. The patient is not nervous/anxious and does not have insomnia.   All other systems reviewed and are negative.  Performance status (ECOG): 2  Vitals Blood pressure 97/65, pulse 85, temperature 97.7 F (36.5 C), temperature source Oral, resp. rate 20, weight 155 lb 6.4 oz (70.5 kg), SpO2 94 %.   Physical Exam  Constitutional: She is oriented to person, place, and time. She appears well-developed and well-nourished. No distress.  She is sitting comfortably in a wheelchair in no acute distress.  HENT:  Head: Normocephalic and atraumatic.  Mouth/Throat: Oropharynx is clear and moist. No oropharyngeal exudate.  Gray hair. Mask.  Edentulous.  Eyes: Pupils are equal, round, and reactive to light. Conjunctivae and EOM are normal. No scleral icterus.  Glasses. Brown eyes s/p cataract surgery.  Bilateral arcus.  Neck: Normal range of motion. Neck supple. No JVD present.  Cardiovascular: Normal rate, regular rhythm and normal heart sounds.  No murmur heard. Pulmonary/Chest: Effort normal and breath sounds normal. No respiratory distress. She has no wheezes. She has no rales.  Intermittent cough.  Abdominal: Soft. Bowel sounds are normal. She exhibits no distension and no mass. There is no abdominal tenderness. There is no rebound and no guarding. A hernia (umbilical) is present.  Musculoskeletal: Normal range of motion.        General: No edema.  Lymphadenopathy:    She has no cervical adenopathy.    She has no axillary adenopathy.       Right: No supraclavicular adenopathy present.       Left: No supraclavicular adenopathy  present.  Neurological: She is alert and oriented to person, place, and time.  Skin: Skin is warm and dry. She is not diaphoretic. No erythema.  Psychiatric: She has a normal mood and affect. Her behavior is normal. Judgment and thought content normal.  Nursing note and vitals reviewed.   Appointment on 11/05/2018  Component Date Value Ref Range Status  . Sodium 11/05/2018 131* 135 - 145 mmol/L Final  . Potassium 11/05/2018 3.9  3.5 - 5.1 mmol/L Final  . Chloride 11/05/2018 96* 98 - 111 mmol/L Final  . CO2 11/05/2018 23  22 - 32 mmol/L Final  . Glucose, Bld 11/05/2018 87  70 - 99 mg/dL Final  . BUN 11/05/2018 14  8 - 23 mg/dL Final  . Creatinine, Ser 11/05/2018 0.77  0.44 - 1.00 mg/dL Final  . Calcium 11/05/2018 8.9  8.9 - 10.3 mg/dL Final  . Total Protein 11/05/2018 8.0  6.5 - 8.1 g/dL Final  . Albumin 11/05/2018 4.5  3.5 - 5.0 g/dL Final  . AST 11/05/2018 19  15 - 41 U/L Final  . ALT 11/05/2018 16  0 - 44 U/L Final  . Alkaline Phosphatase 11/05/2018 69  38 - 126 U/L Final  . Total Bilirubin 11/05/2018 0.6  0.3 - 1.2 mg/dL Final  . GFR calc non Af Amer 11/05/2018 >60  >60 mL/min Final  . GFR calc Af Amer 11/05/2018 >60  >60 mL/min Final  . Anion gap 11/05/2018 12  5 - 15 Final   Performed at [facility], [address], [place], [place] [address]  . WBC 11/05/2018 10.2  4.0 - 10.5 K/uL Final  . RBC 11/05/2018 4.91  3.87 - 5.11 MIL/uL Final  . Hemoglobin 11/05/2018 15.2* 12.0 - 15.0 g/dL Final  . HCT 11/05/2018 44.1  36.0 - 46.0 % Final  . MCV 11/05/2018 89.8  80.0 - 100.0 fL Final  . MCH 11/05/2018 31.0  26.0 - 34.0 pg Final  . MCHC 11/05/2018 34.5  30.0 - 36.0 g/dL Final  . RDW 11/05/2018 13.2  11.5 - 15.5 % Final  . Platelets 11/05/2018 214  150 - 400 K/uL Final  . nRBC 11/05/2018 0.0  0.0 - 0.2 % Final  . Neutrophils Relative % 11/05/2018 78  % Final  . Neutro Abs 11/05/2018 8.0* 1.7 - 7.7 K/uL Final  . Lymphocytes Relative 11/05/2018 13  % Final  . Lymphs  Abs 11/05/2018 1.3  0.7 - 4.0 K/uL Final  . Monocytes Relative 11/05/2018 5  % Final  . Monocytes Absolute 11/05/2018 0.6  0.1 - 1.0 K/uL Final  . Eosinophils Relative 11/05/2018 2  % Final  . Eosinophils Absolute 11/05/2018 0.2  0.0 - 0.5 K/uL Final  . Basophils Relative 11/05/2018 1  % Final  . Basophils Absolute 11/05/2018 0.1  0.0 - 0.1 K/uL Final  . Immature Granulocytes 11/05/2018 1  % Final  . Abs Immature Granulocytes 11/05/2018 0.05  0.00 - 0.07 K/uL Final   Performed at [facility], [address] [place],  [address]    Assessment:  Martha [name] is a 73 y.o. female with iron deficiency anemia.  Diet is fair.  She is on oral iron 1/day.  She has received Feraheme x2 (03/15/2015 - 03/22/2015), 05/17/2015, 09/13/2015, 05/21/2015, and x2 (09/18/2016 - 09/26/2016).     Ferritin has been followed: 15 on 03/08/2015, 31 on 05/06/2015, 25 on 09/06/2015, 14 on 03/13/2016, 14 on 09/11/2016, 43 on 03/12/2017, 14 on 09/17/2017, and 22 on 03/25/2018.  EGD and colonoscopy on 01/25/2014 revealed no blood loss to explain anemia.   Abdomen and pelvis CT on 08/27/2014 showed fatty infiltration of liver without focal liver lesion. Spleen upper normal in size. There was nodular mild enlargement left adrenal gland likely due to adenoma.  There was a 10 x 15 mm lipoma second portion of the duodenum has a benign appearance. There was sigmoid diverticulosis.  She has a history of thyroid cancer.  She has smoking history.  Low dose chest CT on 12/13/2017 revealed Lung-RADS 2, benign appearance or behavior. Annual follow-up was recommended.   Symptomatically, she feels "ok".  She has a chronic cough.  She denies any bleeding.  Exam is unremarkable.  Plan: 1.   Labs today: CBC with diff, CMP, ferritin, iron studies. 2.   Iron deficiency anemia  Review entire medical history, diagnosis and management of iron deficiency anemia.     Hematocrit 44.1.  Hemoglobin 15.2.  Ferritin 27  with an iron saturation of 28%.  No Venofer for today.  Obtain copy of EGD and colonoscopy report. 3.   Smoking history  Discuss low dose chest CT program.  Patient is interested.  RN to contact [facility] re: low dose chest CT program.  Encourage smoking cessation. 4.   RTC in 6 months for MD assessment, labs (CBC with diff, ferritin, iron studies).  I discussed the assessment and treatment plan with the patient.  The patient was provided an opportunity to ask questions and all were answered.  The patient agreed with the plan and demonstrated an understanding of the instructions.  The patient was advised to call back if the symptoms worsen or if the condition fails to improve as anticipated.  I provided 25 minutes of face-to-face time during this this encounter and > 50% was spent counseling as documented under my assessment and plan.    Lequita Asal, MD, PhD    11/05/2018, 3:47 PM  I, Molly Dorshimer, am acting as Education administrator for [employer]. Mike Gip, MD, PhD.  I,  C. Mike Gip, MD, have reviewed the above documentation for accuracy and completeness, and I agree with the above.

## 2018-11-05 ENCOUNTER — Inpatient Hospital Stay: Payer: Medicare Other

## 2018-11-05 ENCOUNTER — Encounter: Payer: Self-pay | Admitting: Hematology and Oncology

## 2018-11-05 ENCOUNTER — Inpatient Hospital Stay: Payer: Medicare Other | Attending: Hematology and Oncology | Admitting: Hematology and Oncology

## 2018-11-05 VITALS — BP 97/65 | HR 85 | Temp 97.7°F | Resp 20 | Wt 155.4 lb

## 2018-11-05 DIAGNOSIS — R251 Tremor, unspecified: Secondary | ICD-10-CM | POA: Diagnosis not present

## 2018-11-05 DIAGNOSIS — Z85850 Personal history of malignant neoplasm of thyroid: Secondary | ICD-10-CM

## 2018-11-05 DIAGNOSIS — M25559 Pain in unspecified hip: Secondary | ICD-10-CM | POA: Insufficient documentation

## 2018-11-05 DIAGNOSIS — Z833 Family history of diabetes mellitus: Secondary | ICD-10-CM | POA: Diagnosis not present

## 2018-11-05 DIAGNOSIS — K760 Fatty (change of) liver, not elsewhere classified: Secondary | ICD-10-CM | POA: Diagnosis not present

## 2018-11-05 DIAGNOSIS — C73 Malignant neoplasm of thyroid gland: Secondary | ICD-10-CM

## 2018-11-05 DIAGNOSIS — F17210 Nicotine dependence, cigarettes, uncomplicated: Secondary | ICD-10-CM | POA: Diagnosis not present

## 2018-11-05 DIAGNOSIS — Z885 Allergy status to narcotic agent status: Secondary | ICD-10-CM

## 2018-11-05 DIAGNOSIS — M25561 Pain in right knee: Secondary | ICD-10-CM

## 2018-11-05 DIAGNOSIS — Z825 Family history of asthma and other chronic lower respiratory diseases: Secondary | ICD-10-CM | POA: Diagnosis not present

## 2018-11-05 DIAGNOSIS — D500 Iron deficiency anemia secondary to blood loss (chronic): Secondary | ICD-10-CM

## 2018-11-05 DIAGNOSIS — M25562 Pain in left knee: Secondary | ICD-10-CM

## 2018-11-05 DIAGNOSIS — Z8261 Family history of arthritis: Secondary | ICD-10-CM | POA: Diagnosis not present

## 2018-11-05 DIAGNOSIS — D509 Iron deficiency anemia, unspecified: Secondary | ICD-10-CM | POA: Diagnosis present

## 2018-11-05 DIAGNOSIS — R0602 Shortness of breath: Secondary | ICD-10-CM | POA: Diagnosis not present

## 2018-11-05 DIAGNOSIS — Z8249 Family history of ischemic heart disease and other diseases of the circulatory system: Secondary | ICD-10-CM | POA: Diagnosis not present

## 2018-11-05 DIAGNOSIS — Z87891 Personal history of nicotine dependence: Secondary | ICD-10-CM

## 2018-11-05 DIAGNOSIS — I10 Essential (primary) hypertension: Secondary | ICD-10-CM

## 2018-11-05 DIAGNOSIS — M1990 Unspecified osteoarthritis, unspecified site: Secondary | ICD-10-CM | POA: Insufficient documentation

## 2018-11-05 DIAGNOSIS — Z8349 Family history of other endocrine, nutritional and metabolic diseases: Secondary | ICD-10-CM

## 2018-11-05 DIAGNOSIS — Z803 Family history of malignant neoplasm of breast: Secondary | ICD-10-CM

## 2018-11-05 DIAGNOSIS — K5730 Diverticulosis of large intestine without perforation or abscess without bleeding: Secondary | ICD-10-CM | POA: Insufficient documentation

## 2018-11-05 DIAGNOSIS — Z79899 Other long term (current) drug therapy: Secondary | ICD-10-CM | POA: Diagnosis not present

## 2018-11-05 DIAGNOSIS — E1140 Type 2 diabetes mellitus with diabetic neuropathy, unspecified: Secondary | ICD-10-CM | POA: Diagnosis not present

## 2018-11-05 LAB — COMPREHENSIVE METABOLIC PANEL
ALT: 16 U/L (ref 0–44)
AST: 19 U/L (ref 15–41)
Albumin: 4.5 g/dL (ref 3.5–5.0)
Alkaline Phosphatase: 69 U/L (ref 38–126)
Anion gap: 12 (ref 5–15)
BUN: 14 mg/dL (ref 8–23)
CO2: 23 mmol/L (ref 22–32)
Calcium: 8.9 mg/dL (ref 8.9–10.3)
Chloride: 96 mmol/L — ABNORMAL LOW (ref 98–111)
Creatinine, Ser: 0.77 mg/dL (ref 0.44–1.00)
GFR calc Af Amer: >60 mL/min (ref >60)
GFR calc non Af Amer: >60 mL/min (ref >60)
Glucose, Bld: 87 mg/dL (ref 70–99)
Potassium: 3.9 mmol/L (ref 3.5–5.1)
Sodium: 131 mmol/L — ABNORMAL LOW (ref 135–145)
Total Bilirubin: 0.6 mg/dL (ref 0.3–1.2)
Total Protein: 8 g/dL (ref 6.5–8.1)

## 2018-11-05 LAB — IRON AND TIBC
Iron: 107 ug/dL (ref 28–170)
Saturation Ratios: 28 % (ref 10.4–31.8)
TIBC: 384 ug/dL (ref 250–450)
UIBC: 277 ug/dL

## 2018-11-05 LAB — CBC WITH DIFFERENTIAL/PLATELET
Abs Immature Granulocytes: 0.05 10*3/uL (ref 0.00–0.07)
Basophils Absolute: 0.1 10*3/uL (ref 0.0–0.1)
Basophils Relative: 1 %
Eosinophils Absolute: 0.2 10*3/uL (ref 0.0–0.5)
Eosinophils Relative: 2 %
HCT: 44.1 % (ref 36.0–46.0)
Hemoglobin: 15.2 g/dL — ABNORMAL HIGH (ref 12.0–15.0)
Immature Granulocytes: 1 %
Lymphocytes Relative: 13 %
Lymphs Abs: 1.3 10*3/uL (ref 0.7–4.0)
MCH: 31 pg (ref 26.0–34.0)
MCHC: 34.5 g/dL (ref 30.0–36.0)
MCV: 89.8 fL (ref 80.0–100.0)
Monocytes Absolute: 0.6 10*3/uL (ref 0.1–1.0)
Monocytes Relative: 5 %
Neutro Abs: 8 10*3/uL — ABNORMAL HIGH (ref 1.7–7.7)
Neutrophils Relative %: 78 %
Platelets: 214 10*3/uL (ref 150–400)
RBC: 4.91 MIL/uL (ref 3.87–5.11)
RDW: 13.2 % (ref 11.5–15.5)
WBC: 10.2 10*3/uL (ref 4.0–10.5)
nRBC: 0 % (ref 0.0–0.2)

## 2018-11-05 LAB — FERRITIN: Ferritin: 27 ng/mL (ref 11–307)

## 2018-11-05 NOTE — Progress Notes (Signed)
Pt here for follow up. Previous Dr. Brahmanday patient. Denies any concerns.  

## 2018-12-16 ENCOUNTER — Encounter: Payer: Self-pay | Admitting: Gastroenterology

## 2018-12-26 ENCOUNTER — Telehealth: Payer: Self-pay | Admitting: *Deleted

## 2018-12-26 NOTE — Telephone Encounter (Signed)
Patient has been notified that lung cancer screening CT scan is due currently or will be in near future. Confirmed that patient is within the appropriate age range, and asymptomatic, (no signs or symptoms of lung cancer). Patient denies illness that would prevent curative treatment for lung cancer if found. Verified smoking history (Current Smoker 0.5ppd). Patient is agreeable for CT scan being scheduled and needs her appointment in the afternoon.

## 2018-12-30 ENCOUNTER — Encounter: Payer: Self-pay | Admitting: *Deleted

## 2018-12-30 ENCOUNTER — Other Ambulatory Visit: Payer: Self-pay | Admitting: *Deleted

## 2018-12-30 DIAGNOSIS — Z87891 Personal history of nicotine dependence: Secondary | ICD-10-CM

## 2018-12-30 DIAGNOSIS — Z122 Encounter for screening for malignant neoplasm of respiratory organs: Secondary | ICD-10-CM

## 2018-12-30 NOTE — Telephone Encounter (Signed)
Current smoking history is current smoker, 39.5 pack year

## 2019-01-06 ENCOUNTER — Other Ambulatory Visit: Payer: Self-pay

## 2019-01-06 ENCOUNTER — Ambulatory Visit
Admission: RE | Admit: 2019-01-06 | Discharge: 2019-01-06 | Disposition: A | Discharge status: Home / Self Care | Payer: Medicare Other | Source: Ambulatory Visit | Attending: Nurse Practitioner | Admitting: Nurse Practitioner

## 2019-01-06 DIAGNOSIS — Z122 Encounter for screening for malignant neoplasm of respiratory organs: Secondary | ICD-10-CM | POA: Diagnosis not present

## 2019-01-06 DIAGNOSIS — Z87891 Personal history of nicotine dependence: Secondary | ICD-10-CM | POA: Insufficient documentation

## 2019-01-08 ENCOUNTER — Encounter: Payer: Self-pay | Admitting: *Deleted

## 2019-04-13 ENCOUNTER — Encounter (INDEPENDENT_AMBULATORY_CARE_PROVIDER_SITE_OTHER): Payer: Medicare Other

## 2019-04-13 ENCOUNTER — Ambulatory Visit (INDEPENDENT_AMBULATORY_CARE_PROVIDER_SITE_OTHER): Payer: Medicare Other | Admitting: Nurse Practitioner

## 2019-05-05 ENCOUNTER — Other Ambulatory Visit: Payer: Self-pay

## 2019-05-05 ENCOUNTER — Inpatient Hospital Stay: Payer: Medicare HMO | Attending: Hematology and Oncology

## 2019-05-05 DIAGNOSIS — C73 Malignant neoplasm of thyroid gland: Secondary | ICD-10-CM

## 2019-05-05 DIAGNOSIS — Z85850 Personal history of malignant neoplasm of thyroid: Secondary | ICD-10-CM | POA: Insufficient documentation

## 2019-05-05 DIAGNOSIS — D509 Iron deficiency anemia, unspecified: Secondary | ICD-10-CM | POA: Diagnosis not present

## 2019-05-05 DIAGNOSIS — D500 Iron deficiency anemia secondary to blood loss (chronic): Secondary | ICD-10-CM

## 2019-05-05 LAB — IRON AND TIBC
Iron: 56 ug/dL (ref 28–170)
Saturation Ratios: 14 % (ref 10.4–31.8)
TIBC: 405 ug/dL (ref 250–450)
UIBC: 349 ug/dL

## 2019-05-05 LAB — CBC WITH DIFFERENTIAL/PLATELET
Abs Immature Granulocytes: 0.03 10*3/uL (ref 0.00–0.07)
Basophils Absolute: 0.1 10*3/uL (ref 0.0–0.1)
Basophils Relative: 1 %
Eosinophils Absolute: 0.2 10*3/uL (ref 0.0–0.5)
Eosinophils Relative: 2 %
HCT: 43.7 % (ref 36.0–46.0)
Hemoglobin: 14.5 g/dL (ref 12.0–15.0)
Immature Granulocytes: 0 %
Lymphocytes Relative: 16 %
Lymphs Abs: 1.2 10*3/uL (ref 0.7–4.0)
MCH: 30 pg (ref 26.0–34.0)
MCHC: 33.2 g/dL (ref 30.0–36.0)
MCV: 90.5 fL (ref 80.0–100.0)
Monocytes Absolute: 0.6 10*3/uL (ref 0.1–1.0)
Monocytes Relative: 8 %
Neutro Abs: 5.3 10*3/uL (ref 1.7–7.7)
Neutrophils Relative %: 73 %
Platelets: 227 10*3/uL (ref 150–400)
RBC: 4.83 MIL/uL (ref 3.87–5.11)
RDW: 15.5 % (ref 11.5–15.5)
WBC: 7.4 10*3/uL (ref 4.0–10.5)
nRBC: 0 % (ref 0.0–0.2)

## 2019-05-05 LAB — FERRITIN: Ferritin: 31 ng/mL (ref 11–307)

## 2019-05-05 NOTE — Progress Notes (Signed)
[facility]  [address] [place], [place] [address] Phone: [phone]  Fax: [phone]   Telephone Office Visit:  05/06/2019  Referring physician: Tracie Harrier, MD  I connected with Martha [name] at 12:00 PM by telephone and verified that I was speaking with the correct person using 2 identifiers.  The patient was at home.  I discussed the limitations, risk, security and privacy concerns of performing an evaluation and management service by telephone and the availability of in person appointments.  I also discussed with the patient that there may be a patient responsible charge related to this service.  The patient expressed understanding and agreed to proceed.   Chief Complaint: Martha [name] is a 74 y.o. female with iron deficiency anemia who is seen for 6 month assessment.  HPI: The patient was last seen in the hematology clinic on 11/05/2018 for new patient assessment.  At that time, she felt "ok".  She had a chronic cough.  She denied any bleeding.  Exam was unremarkable.  Hematocrit was 44.1, hemoglobin 15.2.  Ferritin was 27 with an iron saturation of 28%.  She did not receive Venofer.  Low dose chest CT on 01/06/2019 showed lung-RADS 2, benign appearance or behavior and emphysema. Low-dose chest CT without contrast in 12 months was recommended.  CBC at [facility] on 12/31/2018 revealed hematocrit 46.6, hemoglobin 15.4, MCV 91.9, platelets 288,000, WBC 9900, ANC 7060.   Labs on 05/04/2018 revealed a hematocrit 43.7, hemoglobin 14.5, MCV 90.5, platelets 227,000, WBC 7400, ANC 5300. Ferritin was 31 with an iron saturation of 14% and a TIBC 384.  Colonoscopy in 01/25/2014 was performed by Dr. Candace Cruise. She had diverticulosis in her entire colon.  During the interim, she has been doing "ok".  She denies any bleeding.  She notes tremors.  She has some joint pain.  Her knees don't bother her as long as she takes her medication.  She is still smoking 1/2 ppd.  She has cut back from 10 cigarettes to 8 a day.   Past Medical History:  Diagnosis Date  . Abnormal LFTs (liver function tests)   . Anxiety   . Arthritis   . Asthma   . Chronic bronchitis ([place])   . Chronic constipation   . Chronic low back pain   . Cigarette smoker    Has cut back Smoking to 1 pack every other day  . COPD (chronic obstructive pulmonary disease) ([place])   . Depression   . Diabetic neuropathy ([place])   . Diverticulitis 2015   gi recommended repeat scope in 10 years  . Essential hypertension   . Fatty liver   . GERD (gastroesophageal reflux disease)   . Gout   . History of mammogram 05/28/2013  . Hyperlipidemia   . Hypothyroidism   . Insomnia   . Iron deficiency anemia   . Low serum vitamin D   . Personal history of tobacco use, presenting hazards to health 09/29/2015  . Plantar fasciitis   . RLS (restless legs syndrome)   . Shortness of breath dyspnea   . Thyroid cancer ([place]) 2009  . Tremors of nervous system    "I think I have parkinson's disease"  . Type 2 diabetes mellitus ([place])     Past Surgical History:  Procedure Laterality Date  . CATARACT EXTRACTION W/PHACO Left 11/24/2015   Procedure: CATARACT EXTRACTION PHACO AND INTRAOCULAR LENS PLACEMENT (IOC);  Surgeon: Birder Robson, MD;  Location: [facility] ORS;  Service: Ophthalmology;  Laterality: Left;  [place] 38.3 AP% 20.6 CDE 7.89 FLUID PACK LOT # [id number]  . COLONOSCOPY  2004, 2009, 2015   Aden[place]  . POLYPECTOMY  2009  . THYROIDECTOMY  2009   states she was tx with radiactive iodine  . TUBAL LIGATION    . UPPER GASTROINTESTINAL ENDOSCOPY      Family History  Problem Relation Age of Onset  . Diabetes Mellitus II Father   . Lung disease Father   . Diabetes Mellitus II Mother   . Arthritis Mother   . Thyroid disease Other   . Asthma Sister   . Emphysema Sister   . Hypertension Sister   . Breast cancer Paternal Aunt     Social History:  reports that she has been smoking cigarettes. She has a 39.50  pack-year smoking history. She has never used smokeless tobacco. She reports that she does not drink alcohol or use drugs. She is retired but she used to work at [employer]. She currently smokes 1/2 ppd. She lives in [place], [place]. The patient is alone today.  Participants in the patient's visit and their role in the encounter included the patient, Samul Dada, scribe, and Orlene Och, RN today.  The intake visit was provided by Samul Dada, scribe Orlene Och, RN.   Allergies:  Allergies  Allergen Reactions  . Nicotine Polacrilex Cough    Onset 04/29/2006.  Marland Kitchen Nicotrol [Nicotine] Cough  . Codeine Sulfate Itching    Onset 11/24/1998. tingling    Current Medications: Current Outpatient Medications  Medication Sig Dispense Refill  . alendronate (FOSAMAX) 70 MG tablet TAKE ONE TABLET BY MOUTH EACH WEEK, ON AN EMPTY STOMACH BEFORE BREAKFAST WITH 8oz OF WATER AND REMAIN UPRIGHT FOR :30    . aspirin EC 81 MG tablet Take 81 mg by mouth daily.     . cetirizine (ZYRTEC) 10 MG tablet Take 10 mg by mouth daily.     . colchicine 0.6 MG tablet 1 tab po bid prn    . Docusate Calcium (STOOL SOFTENER PO) Take 1 capsule by mouth as needed.     . ferrous sulfate 325 (65 FE) MG EC tablet Take 325 mg by mouth daily.     Marland Kitchen gabapentin (NEURONTIN) 600 MG tablet Take 600 mg by mouth 3 (three) times daily.    Marland Kitchen glipiZIDE (GLUCOTROL) 10 MG tablet Take 10 mg by mouth 2 (two) times daily.     . hydrochlorothiazide (HYDRODIURIL) 25 MG tablet Take 25 mg by mouth at bedtime.     Marland Kitchen levothyroxine (SYNTHROID, LEVOTHROID) 150 MCG tablet Take 150 mcg by mouth daily before breakfast.     . losartan (COZAAR) 50 MG tablet Take 50 mg by mouth daily.     . metFORMIN (GLUCOPHAGE) 1000 MG tablet Take 1,000 mg by mouth 2 (two) times daily.     . primidone (MYSOLINE) 50 MG tablet 50 mg at bedtime.     Marland Kitchen PROAIR HFA 108 (90 BASE) MCG/ACT inhaler Inhale 2 Inhalers into the lungs every 6 (six) hours as needed for wheezing or  shortness of breath.     . rosuvastatin (CRESTOR) 5 MG tablet Take 5 mg by mouth daily at 6 PM.     . sertraline (ZOLOFT) 100 MG tablet Take 100 mg by mouth daily.    Marland Kitchen tiZANidine (ZANAFLEX) 2 MG tablet TAKE 1 TABLET BY MOUTH 3 TIMES DAILY AS NEEDED FOR MUSCLE SPASMS    . traMADol (ULTRAM) 50 MG tablet Take 1 tablet (50 mg total) by mouth  every 6 (six) hours as needed. 30 tablet 0  . valsartan (DIOVAN) 160 MG tablet Take 160 mg by mouth daily.     . vitamin B-12 (CYANOCOBALAMIN) 1000 MCG tablet Take 1,000 mcg by mouth daily.    . vitamin C (ASCORBIC ACID) 500 MG tablet Take 500 mg by mouth daily.    . Vitamin D, Ergocalciferol, (DRISDOL) 50000 UNITS CAPS capsule Take 50,000 Units by mouth every 7 (seven) days. Tuesday     No current facility-administered medications for this visit.    Review of Systems  Constitutional: Negative.  Negative for chills, diaphoresis, fever, malaise/fatigue and weight loss.       Feels "ok".  HENT: Negative.  Negative for congestion, ear pain, hearing loss, nosebleeds, sinus pain and sore throat.   Eyes: Negative.  Negative for blurred vision and double vision.  Respiratory: Positive for cough and shortness of breath. Negative for hemoptysis, sputum production and wheezing.   Cardiovascular: Negative.  Negative for chest pain, palpitations, orthopnea, leg swelling and PND.  Gastrointestinal: Negative.  Negative for abdominal pain, blood in stool, constipation, diarrhea, melena, nausea and vomiting.       Diverticulosis.  Genitourinary: Negative.  Negative for dysuria, frequency, hematuria and urgency.  Musculoskeletal: Positive for joint pain (knees). Negative for myalgias.  Skin: Negative.  Negative for rash.  Neurological: Positive for tremors (chronic). Negative for sensory change, weakness and headaches.  Endo/Heme/Allergies: Negative.  Does not bruise/bleed easily.  Psychiatric/Behavioral: Positive for memory loss. Negative for depression. The patient is  not nervous/anxious and does not have insomnia.   All other systems reviewed and are negative.  Performance status (ECOG): 1-2  Vitals There were no vitals taken for this visit.   Physical Exam  Constitutional: She is oriented to person, place, and time.  Neurological: She is alert and oriented to person, place, and time.  Psychiatric: She has a normal mood and affect. Her behavior is normal. Judgment and thought content normal.  Nursing note reviewed.   Appointment on 05/05/2019  Component Date Value Ref Range Status  . Iron 05/05/2019 56  28 - 170 ug/dL Final  . TIBC 05/05/2019 405  250 - 450 ug/dL Final  . Saturation Ratios 05/05/2019 14  10.4 - 31.8 % Final  . UIBC 05/05/2019 349  ug/dL Final   Performed at [facility], [address] [place], [place] [address]  . Ferritin 05/05/2019 31  11 - 307 ng/mL Final   Performed at [facility], [address] [place], [place] [address]  . WBC 05/05/2019 7.4  4.0 - 10.5 K/uL Final  . RBC 05/05/2019 4.83  3.87 - 5.11 MIL/uL Final  . Hemoglobin 05/05/2019 14.5  12.0 - 15.0 g/dL Final  . HCT 05/05/2019 43.7  36.0 - 46.0 % Final  . MCV 05/05/2019 90.5  80.0 - 100.0 fL Final  . MCH 05/05/2019 30.0  26.0 - 34.0 pg Final  . MCHC 05/05/2019 33.2  30.0 - 36.0 g/dL Final  . RDW 05/05/2019 15.5  11.5 - 15.5 % Final  . Platelets 05/05/2019 227  150 - 400 K/uL Final  . nRBC 05/05/2019 0.0  0.0 - 0.2 % Final  . Neutrophils Relative % 05/05/2019 73  % Final  . Neutro Abs 05/05/2019 5.3  1.7 - 7.7 K/uL Final  . Lymphocytes Relative 05/05/2019 16  % Final  . Lymphs Abs 05/05/2019 1.2  0.7 - 4.0 K/uL Final  . Monocytes Relative 05/05/2019 8  % Final  . Monocytes Absolute  05/05/2019 0.6  0.1 - 1.0 K/uL Final  . Eosinophils Relative 05/05/2019 2  % Final  . Eosinophils Absolute 05/05/2019 0.2  0.0 - 0.5 K/uL Final  . Basophils Relative 05/05/2019 1  % Final  . Basophils Absolute 05/05/2019 0.1  0.0 - 0.1 K/uL Final  .  Immature Granulocytes 05/05/2019 0  % Final  . Abs Immature Granulocytes 05/05/2019 0.03  0.00 - 0.07 K/uL Final   Performed at [facility], [address] [place], [place] [address]    Assessment:  Martha [name] is a 74 y.o. female with iron deficiency anemia.  Diet is fair.  She is on oral iron 1/day.  She has received Feraheme x2 (03/15/2015 - 03/22/2015), 05/17/2015, 09/13/2015, 05/21/2015, and x2 (09/18/2016 - 09/26/2016).     Ferritin has been followed: 15 on 03/08/2015, 31 on 05/06/2015, 25 on 09/06/2015, 14 on 03/13/2016, 14 on 09/11/2016, 43 on 03/12/2017, 14 on 09/17/2017, and 22 on 03/25/2018.   EGD on 01/25/2014 revealed no blood loss to explain anemia.  Colonoscopy on 01/25/2014 revealed diverticulosis in her entire colon.  Abdomen and pelvis CT on 08/27/2014 showed fatty infiltration of liver without focal liver lesion. Spleen upper normal in size. There was nodular mild enlargement left adrenal gland likely due to adenoma.  There was a 10 x 15 mm lipoma second portion of the duodenum has a benign appearance. There was sigmoid diverticulosis.  She has a history of thyroid cancer.  She has smoking history.  Low dose chest CT on 12/13/2017 revealed Lung-RADS 2, benign appearance or behavior.  Low dose chest CT on 01/06/2019 showed lung-RADS 2, benign appearance or behavior and emphysema.   Symptomatically, she is doing well.  She denies any bleeding.  Plan: 1.   Review labs from 05/05/2019. 2.   Iron deficiency anemia  Hematocrit 43.7.  Hemoglobin 14.5.  MCV 90.5.             Ferritin 31 with an iron saturation of 14%.             No Venofer today.             Review interval colonoscopy report. 3.   Smoking history             Review interval low dose chest CT- images reviewed.  Agree with radiology.   No evidence of malignancy.  Continue annual low dose chest CT.  Encourage smoking cessation.  Patient has cut back from 10 to 8 cigarettes/day. 4.    RTC in 6 months for labs (CBC with diff, ferritin).   5.   RTC in 12 months for MD assessment, labs (CBC with diff, ferritin, iron studies).  I discussed the assessment and treatment plan with the patient.  The patient was provided an opportunity to ask questions and all were answered.  The patient agreed with the plan and demonstrated an understanding of the instructions.  The patient was advised to call back if the symptoms worsen or if the condition fails to improve as anticipated.  I provided 9 minutes (12:00 PM - 12:08 PM) of face-to-face time during this this encounter and > 50% was spent counseling as documented under my assessment and plan.    Lequita Asal, MD, PhD    05/06/2019, 12:08 PM  I, Samul Dada, am acting as a scribe for Lequita Asal, MD.  I, [place] Mike Gip, MD, have reviewed the above documentation for accuracy and completeness, and I agree with the  above.

## 2019-05-06 ENCOUNTER — Inpatient Hospital Stay (HOSPITAL_BASED_OUTPATIENT_CLINIC_OR_DEPARTMENT_OTHER): Payer: Medicare HMO | Admitting: Hematology and Oncology

## 2019-05-06 ENCOUNTER — Encounter: Payer: Self-pay | Admitting: Hematology and Oncology

## 2019-05-06 ENCOUNTER — Other Ambulatory Visit: Payer: Medicare Other

## 2019-05-06 DIAGNOSIS — I10 Essential (primary) hypertension: Secondary | ICD-10-CM | POA: Diagnosis not present

## 2019-05-06 DIAGNOSIS — R0602 Shortness of breath: Secondary | ICD-10-CM | POA: Diagnosis not present

## 2019-05-06 DIAGNOSIS — Z122 Encounter for screening for malignant neoplasm of respiratory organs: Secondary | ICD-10-CM

## 2019-05-06 DIAGNOSIS — R05 Cough: Secondary | ICD-10-CM

## 2019-05-06 DIAGNOSIS — D500 Iron deficiency anemia secondary to blood loss (chronic): Secondary | ICD-10-CM

## 2019-05-06 DIAGNOSIS — Z7984 Long term (current) use of oral hypoglycemic drugs: Secondary | ICD-10-CM

## 2019-05-06 DIAGNOSIS — E785 Hyperlipidemia, unspecified: Secondary | ICD-10-CM

## 2019-05-06 DIAGNOSIS — Z833 Family history of diabetes mellitus: Secondary | ICD-10-CM

## 2019-05-06 DIAGNOSIS — E039 Hypothyroidism, unspecified: Secondary | ICD-10-CM | POA: Diagnosis not present

## 2019-05-06 DIAGNOSIS — E119 Type 2 diabetes mellitus without complications: Secondary | ICD-10-CM

## 2019-05-06 DIAGNOSIS — Z7982 Long term (current) use of aspirin: Secondary | ICD-10-CM

## 2019-05-06 DIAGNOSIS — F17210 Nicotine dependence, cigarettes, uncomplicated: Secondary | ICD-10-CM

## 2019-05-06 DIAGNOSIS — Z79899 Other long term (current) drug therapy: Secondary | ICD-10-CM

## 2019-05-06 DIAGNOSIS — Z85850 Personal history of malignant neoplasm of thyroid: Secondary | ICD-10-CM

## 2019-05-06 NOTE — Progress Notes (Signed)
Patient just wants to know about her blood results

## 2019-05-29 DIAGNOSIS — E113393 Type 2 diabetes mellitus with moderate nonproliferative diabetic retinopathy without macular edema, bilateral: Secondary | ICD-10-CM | POA: Diagnosis not present

## 2019-06-12 DIAGNOSIS — J449 Chronic obstructive pulmonary disease, unspecified: Secondary | ICD-10-CM | POA: Diagnosis not present

## 2019-06-12 DIAGNOSIS — H2511 Age-related nuclear cataract, right eye: Secondary | ICD-10-CM | POA: Diagnosis not present

## 2019-06-15 ENCOUNTER — Encounter: Payer: Self-pay | Admitting: Ophthalmology

## 2019-06-19 ENCOUNTER — Other Ambulatory Visit
Admission: RE | Admit: 2019-06-19 | Discharge: 2019-06-19 | Disposition: A | Discharge status: Home / Self Care | Payer: Medicare HMO | Source: Ambulatory Visit | Attending: Ophthalmology | Admitting: Ophthalmology

## 2019-06-19 DIAGNOSIS — Z01812 Encounter for preprocedural laboratory examination: Secondary | ICD-10-CM | POA: Diagnosis not present

## 2019-06-19 DIAGNOSIS — Z20822 Contact with and (suspected) exposure to covid-19: Secondary | ICD-10-CM | POA: Diagnosis not present

## 2019-06-19 LAB — SARS CORONAVIRUS 2 (TAT 6-24 HRS): SARS Coronavirus 2: NEGATIVE

## 2019-06-19 NOTE — Discharge Instructions (Signed)

## 2019-06-22 NOTE — Anesthesia Preprocedure Evaluation (Addendum)
Anesthesia Evaluation  Patient identified by MRN, date of birth, ID band Patient awake    Reviewed: NPO status   History of Anesthesia Complications Negative for: history of anesthetic complications  Airway Mallampati: II  TM Distance: >3 FB Neck ROM: full    Dental no notable dental hx. (+) Edentulous Upper, Edentulous Lower   Pulmonary COPD (moderate; R.A. sats in high 80's%), Current SmokerPatient did not abstain from smoking.,    Pulmonary exam normal        Cardiovascular Exercise Tolerance: Good hypertension, Normal cardiovascular exam     Neuro/Psych Anxiety Depression Neuropathy;  Hand tremors; RLS    GI/Hepatic Neg liver ROS, GERD  Controlled,Fatty liver   Endo/Other  diabetesHypothyroidism   Renal/GU negative Renal ROS  negative genitourinary   Musculoskeletal  (+) Arthritis , gout   Abdominal   Peds  Hematology  (+) anemia ,   Anesthesia Other Findings Lexiscan Myoview on 06/04/2017. Gated scintigraphy revealed LVEF 57%. SPECT imaging revealed normal wall motion without evidence of scar or ischemia. 2D echocardiogram on 06/04/2017 revealed normal left ventricular function, with LVEF greater than 55%, with moderate LVH, with mild mitral and pulmonic valve regurgitation.    Cards: Isaias Cowman, MD at 06/17/2019;  Covid: NEG.  3/20: Oxygen Saturation 93%  8/20: Oxygen Saturation 94%  9/20: Oxygen Saturation 91%  06/17/2019: Oxygen Saturation 89%  06/23/19: room air sats: 89%   Reproductive/Obstetrics                            Anesthesia Physical Anesthesia Plan  ASA: III  Anesthesia Plan: MAC   Post-op Pain Management:    Induction:   PONV Risk Score and Plan: 2 and TIVA and Midazolam  Airway Management Planned:   Additional Equipment:   Intra-op Plan:   Post-operative Plan:   Informed Consent: I have reviewed the patients History and Physical, chart,  labs and discussed the procedure including the risks, benefits and alternatives for the proposed anesthesia with the patient or authorized representative who has indicated his/her understanding and acceptance.       Plan Discussed with: CRNA  Anesthesia Plan Comments:         Anesthesia Quick Evaluation

## 2019-06-23 ENCOUNTER — Encounter
Admission: RE | Disposition: A | Discharge status: Home / Self Care | Payer: Self-pay | Source: Ambulatory Visit | Attending: Ophthalmology

## 2019-06-23 ENCOUNTER — Other Ambulatory Visit: Payer: Self-pay

## 2019-06-23 ENCOUNTER — Ambulatory Visit
Admission: RE | Admit: 2019-06-23 | Discharge: 2019-06-23 | Disposition: A | Discharge status: Home / Self Care | Payer: Medicare HMO | Source: Ambulatory Visit | Attending: Ophthalmology | Admitting: Ophthalmology

## 2019-06-23 ENCOUNTER — Encounter: Payer: Self-pay | Admitting: Ophthalmology

## 2019-06-23 ENCOUNTER — Ambulatory Visit: Payer: Medicare HMO | Admitting: Anesthesiology

## 2019-06-23 DIAGNOSIS — R251 Tremor, unspecified: Secondary | ICD-10-CM | POA: Insufficient documentation

## 2019-06-23 DIAGNOSIS — J449 Chronic obstructive pulmonary disease, unspecified: Secondary | ICD-10-CM | POA: Insufficient documentation

## 2019-06-23 DIAGNOSIS — I371 Nonrheumatic pulmonary valve insufficiency: Secondary | ICD-10-CM | POA: Diagnosis not present

## 2019-06-23 DIAGNOSIS — M109 Gout, unspecified: Secondary | ICD-10-CM | POA: Insufficient documentation

## 2019-06-23 DIAGNOSIS — I10 Essential (primary) hypertension: Secondary | ICD-10-CM | POA: Insufficient documentation

## 2019-06-23 DIAGNOSIS — F329 Major depressive disorder, single episode, unspecified: Secondary | ICD-10-CM | POA: Diagnosis not present

## 2019-06-23 DIAGNOSIS — H2511 Age-related nuclear cataract, right eye: Secondary | ICD-10-CM | POA: Diagnosis not present

## 2019-06-23 DIAGNOSIS — F419 Anxiety disorder, unspecified: Secondary | ICD-10-CM | POA: Insufficient documentation

## 2019-06-23 DIAGNOSIS — F17200 Nicotine dependence, unspecified, uncomplicated: Secondary | ICD-10-CM | POA: Diagnosis not present

## 2019-06-23 DIAGNOSIS — G629 Polyneuropathy, unspecified: Secondary | ICD-10-CM | POA: Insufficient documentation

## 2019-06-23 DIAGNOSIS — M1990 Unspecified osteoarthritis, unspecified site: Secondary | ICD-10-CM | POA: Diagnosis not present

## 2019-06-23 DIAGNOSIS — D649 Anemia, unspecified: Secondary | ICD-10-CM | POA: Diagnosis not present

## 2019-06-23 DIAGNOSIS — I340 Nonrheumatic mitral (valve) insufficiency: Secondary | ICD-10-CM | POA: Insufficient documentation

## 2019-06-23 DIAGNOSIS — K760 Fatty (change of) liver, not elsewhere classified: Secondary | ICD-10-CM | POA: Diagnosis not present

## 2019-06-23 DIAGNOSIS — K219 Gastro-esophageal reflux disease without esophagitis: Secondary | ICD-10-CM | POA: Insufficient documentation

## 2019-06-23 DIAGNOSIS — H25811 Combined forms of age-related cataract, right eye: Secondary | ICD-10-CM | POA: Diagnosis not present

## 2019-06-23 HISTORY — PX: CATARACT EXTRACTION W/PHACO: SHX586

## 2019-06-23 HISTORY — DX: Presence of dental prosthetic device (complete) (partial): Z97.2

## 2019-06-23 LAB — GLUCOSE, CAPILLARY
Glucose-Capillary: 148 mg/dL — ABNORMAL HIGH (ref 70–99)
Glucose-Capillary: 145 mg/dL — ABNORMAL HIGH (ref 70–99)

## 2019-06-23 SURGERY — PHACOEMULSIFICATION, CATARACT, WITH IOL INSERTION
Anesthesia: Monitor Anesthesia Care | Site: Eye | Laterality: Right

## 2019-06-23 MED ORDER — MIDAZOLAM HCL 2 MG/2ML IJ SOLN
INTRAMUSCULAR | Status: DC | PRN
  Administered 2019-06-23: 1 mg via INTRAVENOUS

## 2019-06-23 MED ORDER — EPINEPHRINE PF 1 MG/ML IJ SOLN
INTRAOCULAR | Status: DC | PRN
  Administered 2019-06-23: 40 mL via OPHTHALMIC

## 2019-06-23 MED ORDER — ARMC OPHTHALMIC DILATING DROPS
1.0000 "application " | OPHTHALMIC | Status: DC | PRN
  Administered 2019-06-23 (×3): 1 via OPHTHALMIC

## 2019-06-23 MED ORDER — LACTATED RINGERS IV SOLN: 100.0000 mL/h | INTRAVENOUS | Status: DC

## 2019-06-23 MED ORDER — TETRACAINE HCL 0.5 % OP SOLN
1.0000 [drp] | OPHTHALMIC | Status: DC | PRN
  Administered 2019-06-23 (×3): 1 [drp] via OPHTHALMIC

## 2019-06-23 MED ORDER — MOXIFLOXACIN HCL 0.5 % OP SOLN
OPHTHALMIC | Status: DC | PRN
  Administered 2019-06-23: 0.2 mL via OPHTHALMIC

## 2019-06-23 MED ORDER — LIDOCAINE HCL (PF) 2 % IJ SOLN
INTRAOCULAR | Status: DC | PRN
  Administered 2019-06-23: 1 mL via INTRAMUSCULAR

## 2019-06-23 MED ORDER — NA CHONDROIT SULF-NA HYALURON 40-17 MG/ML IO SOLN
INTRAOCULAR | Status: DC | PRN
  Administered 2019-06-23: 1 mL via INTRAOCULAR

## 2019-06-23 MED ORDER — FENTANYL CITRATE (PF) 100 MCG/2ML IJ SOLN
INTRAMUSCULAR | Status: DC | PRN
  Administered 2019-06-23: 50 ug via INTRAVENOUS

## 2019-06-23 MED ORDER — BRIMONIDINE TARTRATE-TIMOLOL 0.2-0.5 % OP SOLN
OPHTHALMIC | Status: DC | PRN
  Administered 2019-06-23: 1 [drp] via OPHTHALMIC

## 2019-06-23 SURGICAL SUPPLY — 22 items
CANNULA ANT/CHMB 27G (MISCELLANEOUS) ×2 IMPLANT
CANNULA ANT/CHMB 27GA (MISCELLANEOUS) ×4 IMPLANT
GLOVE SURG LX 8.0 MICRO (GLOVE) ×1
GLOVE SURG LX STRL 8.0 MICRO (GLOVE) ×1 IMPLANT
GLOVE SURG TRIUMPH 8.0 PF LTX (GLOVE) ×3 IMPLANT
GOWN STRL REUS W/ TWL LRG LVL3 (GOWN DISPOSABLE) ×2 IMPLANT
GOWN STRL REUS W/TWL LRG LVL3 (GOWN DISPOSABLE) ×2
LENS IOL TECNIS ITEC 20.5 (Intraocular Lens) ×1 IMPLANT
MARKER SKIN DUAL TIP RULER LAB (MISCELLANEOUS) ×2 IMPLANT
NDL FILTER BLUNT 18X1 1/2 (NEEDLE) ×1 IMPLANT
NDL RETROBULBAR .5 NSTRL (NEEDLE) ×2 IMPLANT
NEEDLE FILTER BLUNT 18X 1/2SAF (NEEDLE) ×1
NEEDLE FILTER BLUNT 18X1 1/2 (NEEDLE) ×1 IMPLANT
PACK EYE AFTER SURG (MISCELLANEOUS) ×2 IMPLANT
PACK OPTHALMIC (MISCELLANEOUS) ×2 IMPLANT
PACK PORFILIO (MISCELLANEOUS) ×2 IMPLANT
SUT ETHILON 10-0 CS-B-6CS-B-6 (SUTURE)
SUTURE EHLN 10-0 CS-B-6CS-B-6 (SUTURE) IMPLANT
SYR 3ML LL SCALE MARK (SYRINGE) ×2 IMPLANT
SYR TB 1ML LUER SLIP (SYRINGE) ×2 IMPLANT
WATER STERILE IRR 250ML POUR (IV SOLUTION) ×2 IMPLANT
WIPE NON LINTING 3.25X3.25 (MISCELLANEOUS) ×2 IMPLANT

## 2019-06-23 NOTE — Transfer of Care (Signed)
Immediate Anesthesia Transfer of Care Note  Patient: Martha [name]  Procedure(s) Performed: CATARACT EXTRACTION PHACO AND INTRAOCULAR LENS PLACEMENT (IOC) RIGHT DIABETIC (Right Eye)  Patient Location: PACU  Anesthesia Type: MAC  Level of Consciousness: awake, alert  and patient cooperative  Airway and Oxygen Therapy: Patient Spontanous Breathing and Patient connected to supplemental oxygen  Post-op Assessment: Post-op Vital signs reviewed, Patient's Cardiovascular Status Stable, Respiratory Function Stable, Patent Airway and No signs of Nausea or vomiting  Post-op Vital Signs: Reviewed and stable  Complications: No apparent anesthesia complications

## 2019-06-23 NOTE — Op Note (Signed)
PREOPERATIVE DIAGNOSIS:  Nuclear sclerotic cataract of the right eye.   POSTOPERATIVE DIAGNOSIS:  Cataract   OPERATIVE PROCEDURE:@   SURGEON:  Birder Robson, MD.   ANESTHESIA:  Anesthesiologist: Fidel Levy, MD CRNA: Mayme Genta, CRNA  1.      Managed anesthesia care. 2.      0.39ml of Shugarcaine was instilled in the eye following the paracentesis.   COMPLICATIONS:  None.   TECHNIQUE:   Stop and chop   DESCRIPTION OF PROCEDURE:  The patient was examined and consented in the preoperative holding area where the aforementioned topical anesthesia was applied to the right eye and then brought back to the Operating Room where the right eye was prepped and draped in the usual sterile ophthalmic fashion and a lid speculum was placed. A paracentesis was created with the side port blade and the anterior chamber was filled with viscoelastic. A near clear corneal incision was performed with the steel keratome. A continuous curvilinear capsulorrhexis was performed with a cystotome followed by the capsulorrhexis forceps. Hydrodissection and hydrodelineation were carried out with BSS on a blunt cannula. The lens was removed in a stop and chop  technique and the remaining cortical material was removed with the irrigation-aspiration handpiece. The capsular bag was inflated with viscoelastic and the Technis [id number]  lens was placed in the capsular bag without complication. The remaining viscoelastic was removed from the eye with the irrigation-aspiration handpiece. The wounds were hydrated. The anterior chamber was flushed with BSS and the eye was inflated to physiologic pressure. 0.43ml of Vigamox was placed in the anterior chamber. The wounds were found to be water tight. The eye was dressed with Combigan. The patient was given protective glasses to wear throughout the day and a shield with which to sleep tonight. The patient was also given drops with which to begin a drop regimen today and will follow-up  with me in one day. Implant Name Type Inv. Item Serial No. Manufacturer Lot No. LRB No. Used Action  LENS IOL DIOP 20.5 - [id number] Intraocular Lens LENS IOL DIOP 20.5 [id number]  Right 1 Implanted   Procedure(s) with comments: CATARACT EXTRACTION PHACO AND INTRAOCULAR LENS PLACEMENT (IOC) RIGHT DIABETIC (Right) - Diabetic  Electronically signed: Birder Robson 06/23/2019 7:51 AM

## 2019-06-23 NOTE — Anesthesia Procedure Notes (Signed)
Performed by: Rajni Holsworth, CRNA Pre-anesthesia Checklist: Patient identified, Emergency Drugs available, Suction available, Timeout performed and Patient being monitored Patient Re-evaluated:Patient Re-evaluated prior to induction Oxygen Delivery Method: Nasal cannula Placement Confirmation: positive ETCO2       

## 2019-06-23 NOTE — Anesthesia Postprocedure Evaluation (Signed)
Anesthesia Post Note  Patient: Martha [name]  Procedure(s) Performed: CATARACT EXTRACTION PHACO AND INTRAOCULAR LENS PLACEMENT (IOC) RIGHT DIABETIC (Right Eye)     Patient location during evaluation: PACU Anesthesia Type: MAC Level of consciousness: awake and alert Pain management: pain level controlled Vital Signs Assessment: post-procedure vital signs reviewed and stable Respiratory status: spontaneous breathing, nonlabored ventilation, respiratory function stable and patient connected to nasal cannula oxygen Cardiovascular status: stable and blood pressure returned to baseline Postop Assessment: no apparent nausea or vomiting Anesthetic complications: no Comments: Post op room air sats 88-89%. Baseline sats were similar.    Fidel Levy

## 2019-06-23 NOTE — H&P (Signed)
All labs reviewed. Abnormal studies sent to patients PCP when indicated.  Previous H&P reviewed, patient examined, there are NO CHANGES.  Martha Campus Porfilio2/23/20217:22 AM

## 2019-06-24 ENCOUNTER — Encounter: Payer: Self-pay | Admitting: *Deleted

## 2019-11-03 ENCOUNTER — Other Ambulatory Visit: Payer: Self-pay

## 2019-11-03 ENCOUNTER — Inpatient Hospital Stay: Payer: Medicare Other | Attending: Hematology and Oncology

## 2019-11-03 DIAGNOSIS — D509 Iron deficiency anemia, unspecified: Secondary | ICD-10-CM | POA: Diagnosis present

## 2019-11-03 DIAGNOSIS — Z122 Encounter for screening for malignant neoplasm of respiratory organs: Secondary | ICD-10-CM

## 2019-11-03 DIAGNOSIS — D500 Iron deficiency anemia secondary to blood loss (chronic): Secondary | ICD-10-CM

## 2019-11-03 LAB — CBC WITH DIFFERENTIAL/PLATELET
Abs Immature Granulocytes: 0.06 10*3/uL (ref 0.00–0.07)
Basophils Absolute: 0.1 10*3/uL (ref 0.0–0.1)
Basophils Relative: 1 %
Eosinophils Absolute: 0.3 10*3/uL (ref 0.0–0.5)
Eosinophils Relative: 2 %
HCT: 44.9 % (ref 36.0–46.0)
Hemoglobin: 14.9 g/dL (ref 12.0–15.0)
Immature Granulocytes: 1 %
Lymphocytes Relative: 13 %
Lymphs Abs: 1.5 10*3/uL (ref 0.7–4.0)
MCH: 29.4 pg (ref 26.0–34.0)
MCHC: 33.2 g/dL (ref 30.0–36.0)
MCV: 88.7 fL (ref 80.0–100.0)
Monocytes Absolute: 0.7 10*3/uL (ref 0.1–1.0)
Monocytes Relative: 6 %
Neutro Abs: 8.8 10*3/uL — ABNORMAL HIGH (ref 1.7–7.7)
Neutrophils Relative %: 77 %
Platelets: 273 10*3/uL (ref 150–400)
RBC: 5.06 MIL/uL (ref 3.87–5.11)
RDW: 16.2 % — ABNORMAL HIGH (ref 11.5–15.5)
WBC: 11.4 10*3/uL — ABNORMAL HIGH (ref 4.0–10.5)
nRBC: 0 % (ref 0.0–0.2)

## 2019-11-03 LAB — FERRITIN: Ferritin: 30 ng/mL (ref 11–307)

## 2019-12-09 ENCOUNTER — Telehealth: Payer: Self-pay | Admitting: *Deleted

## 2019-12-09 NOTE — Telephone Encounter (Signed)
(  12/09/2019) Left message for pt to notify them that it is time to schedule annual low dose lung cancer screening CT scan. Instructed patient to call back to verify information prior to the scan being scheduled SRW

## 2019-12-15 ENCOUNTER — Telehealth: Payer: Self-pay | Admitting: *Deleted

## 2019-12-15 NOTE — Telephone Encounter (Signed)
(  12/15/2019) Pt notified that lung cancer screening imaging is due currently or in the near future. Verified smoking history (Current Smoker,1 ppd ). Tentative appt for 01/21/20 @ 11:00 am. SRW

## 2019-12-16 ENCOUNTER — Other Ambulatory Visit: Payer: Self-pay | Admitting: *Deleted

## 2019-12-16 DIAGNOSIS — Z87891 Personal history of nicotine dependence: Secondary | ICD-10-CM

## 2019-12-16 DIAGNOSIS — Z122 Encounter for screening for malignant neoplasm of respiratory organs: Secondary | ICD-10-CM

## 2019-12-23 ENCOUNTER — Ambulatory Visit: Payer: Medicare HMO

## 2020-01-18 ENCOUNTER — Other Ambulatory Visit: Payer: Self-pay

## 2020-01-18 ENCOUNTER — Other Ambulatory Visit: Payer: Self-pay | Admitting: Internal Medicine

## 2020-01-18 ENCOUNTER — Ambulatory Visit
Admission: RE | Admit: 2020-01-18 | Discharge: 2020-01-18 | Disposition: A | Discharge status: Home / Self Care | Payer: Medicare Other | Source: Ambulatory Visit | Attending: Internal Medicine | Admitting: Internal Medicine

## 2020-01-18 DIAGNOSIS — R600 Localized edema: Secondary | ICD-10-CM

## 2020-01-18 DIAGNOSIS — M7989 Other specified soft tissue disorders: Secondary | ICD-10-CM | POA: Diagnosis present

## 2020-01-18 DIAGNOSIS — R809 Proteinuria, unspecified: Secondary | ICD-10-CM

## 2020-01-19 ENCOUNTER — Other Ambulatory Visit: Payer: Self-pay | Admitting: Internal Medicine

## 2020-01-19 DIAGNOSIS — R600 Localized edema: Secondary | ICD-10-CM

## 2020-01-19 DIAGNOSIS — R809 Proteinuria, unspecified: Secondary | ICD-10-CM

## 2020-01-21 ENCOUNTER — Ambulatory Visit: Admission: RE | Admit: 2020-01-21 | Payer: Medicare Other | Source: Ambulatory Visit

## 2020-01-28 ENCOUNTER — Ambulatory Visit: Payer: Medicare Other | Attending: Internal Medicine

## 2020-02-03 ENCOUNTER — Telehealth: Payer: Self-pay | Admitting: *Deleted

## 2020-02-03 DIAGNOSIS — Z87891 Personal history of nicotine dependence: Secondary | ICD-10-CM

## 2020-02-03 DIAGNOSIS — Z122 Encounter for screening for malignant neoplasm of respiratory organs: Secondary | ICD-10-CM

## 2020-02-03 NOTE — Telephone Encounter (Signed)
Attempted to contact and schedule lung screening scan. Message left for patient to call back to schedule. Note, patient had prior no show appointment for lung screening.

## 2020-02-15 NOTE — Addendum Note (Signed)
Addended by: Lieutenant Diego on: 02/15/2020 11:21 AM   Modules accepted: Orders

## 2020-02-15 NOTE — Telephone Encounter (Signed)
Contacted and scheduled. Prefers [place] location. Current smoker, 42.5 pack year

## 2020-02-22 ENCOUNTER — Ambulatory Visit: Admission: RE | Admit: 2020-02-22 | Payer: Medicare Other | Source: Ambulatory Visit

## 2020-03-03 ENCOUNTER — Other Ambulatory Visit (HOSPITAL_COMMUNITY): Payer: Self-pay | Admitting: Nephrology

## 2020-03-03 ENCOUNTER — Other Ambulatory Visit: Payer: Self-pay | Admitting: Nephrology

## 2020-03-03 DIAGNOSIS — R809 Proteinuria, unspecified: Secondary | ICD-10-CM

## 2020-03-03 DIAGNOSIS — E1122 Type 2 diabetes mellitus with diabetic chronic kidney disease: Secondary | ICD-10-CM

## 2020-03-03 DIAGNOSIS — N1831 Chronic kidney disease, stage 3a: Secondary | ICD-10-CM

## 2020-03-03 DIAGNOSIS — R600 Localized edema: Secondary | ICD-10-CM

## 2020-03-10 ENCOUNTER — Ambulatory Visit: Admission: RE | Admit: 2020-03-10 | Payer: Medicare Other | Source: Ambulatory Visit

## 2020-03-15 ENCOUNTER — Other Ambulatory Visit (INDEPENDENT_AMBULATORY_CARE_PROVIDER_SITE_OTHER): Payer: Self-pay | Admitting: Nurse Practitioner

## 2020-03-15 ENCOUNTER — Other Ambulatory Visit (INDEPENDENT_AMBULATORY_CARE_PROVIDER_SITE_OTHER): Payer: Self-pay | Admitting: Vascular Surgery

## 2020-03-15 DIAGNOSIS — I739 Peripheral vascular disease, unspecified: Secondary | ICD-10-CM

## 2020-03-15 DIAGNOSIS — R609 Edema, unspecified: Secondary | ICD-10-CM

## 2020-03-15 DIAGNOSIS — I872 Venous insufficiency (chronic) (peripheral): Secondary | ICD-10-CM

## 2020-03-16 ENCOUNTER — Ambulatory Visit (INDEPENDENT_AMBULATORY_CARE_PROVIDER_SITE_OTHER): Payer: Medicare Other | Admitting: Nurse Practitioner

## 2020-03-16 ENCOUNTER — Encounter (INDEPENDENT_AMBULATORY_CARE_PROVIDER_SITE_OTHER): Payer: Medicare Other

## 2020-03-22 ENCOUNTER — Telehealth: Payer: Self-pay | Admitting: *Deleted

## 2020-03-22 NOTE — Telephone Encounter (Signed)
Contacted regarding no show appt for lung screening. Patient would like appt. Rescheduled in [place] location. Will obtain appt and call patient back.

## 2020-03-23 ENCOUNTER — Other Ambulatory Visit: Payer: Self-pay | Admitting: Nephrology

## 2020-03-23 DIAGNOSIS — R809 Proteinuria, unspecified: Secondary | ICD-10-CM

## 2020-03-29 NOTE — Progress Notes (Signed)
Patient on schedule for Renal Biopsy 03/31/2020, called and spoke with Bernice/daughter on phone with instructions given. Made aware to be here @ 0830, NPO after [place] as well as driver for discharge post procedure/recovery. Will also hold am dose of Metformin,and will take am dose BP meds for procedure. Stated understanding.

## 2020-03-30 ENCOUNTER — Other Ambulatory Visit: Payer: Self-pay | Admitting: Student

## 2020-03-30 ENCOUNTER — Ambulatory Visit: Payer: Medicare Other

## 2020-03-31 ENCOUNTER — Ambulatory Visit: Admission: RE | Admit: 2020-03-31 | Payer: Medicare Other | Source: Ambulatory Visit

## 2020-03-31 ENCOUNTER — Ambulatory Visit
Admission: RE | Admit: 2020-03-31 | Discharge: 2020-03-31 | Disposition: A | Discharge status: Home / Self Care | Payer: Medicare Other | Source: Ambulatory Visit | Attending: Nephrology | Admitting: Nephrology

## 2020-03-31 ENCOUNTER — Other Ambulatory Visit: Payer: Self-pay

## 2020-03-31 ENCOUNTER — Ambulatory Visit: Payer: Medicare Other

## 2020-03-31 DIAGNOSIS — R809 Proteinuria, unspecified: Secondary | ICD-10-CM

## 2020-03-31 DIAGNOSIS — I130 Hypertensive heart and chronic kidney disease with heart failure and stage 1 through stage 4 chronic kidney disease, or unspecified chronic kidney disease: Secondary | ICD-10-CM | POA: Diagnosis not present

## 2020-03-31 DIAGNOSIS — J810 Acute pulmonary edema: Secondary | ICD-10-CM | POA: Diagnosis not present

## 2020-03-31 LAB — CBC
HCT: 52.6 % — ABNORMAL HIGH (ref 36.0–46.0)
Hemoglobin: 16.9 g/dL — ABNORMAL HIGH (ref 12.0–15.0)
MCH: 28.5 pg (ref 26.0–34.0)
MCHC: 32.1 g/dL (ref 30.0–36.0)
MCV: 88.9 fL (ref 80.0–100.0)
Platelets: 246 10*3/uL (ref 150–400)
RBC: 5.92 MIL/uL — ABNORMAL HIGH (ref 3.87–5.11)
RDW: 19.6 % — ABNORMAL HIGH (ref 11.5–15.5)
WBC: 12.7 10*3/uL — ABNORMAL HIGH (ref 4.0–10.5)
nRBC: 0 % (ref 0.0–0.2)

## 2020-03-31 LAB — PROTIME-INR
INR: 1 (ref 0.8–1.2)
Prothrombin Time: 12.8 seconds (ref 11.4–15.2)

## 2020-03-31 MED ORDER — SODIUM CHLORIDE 0.9 % IV SOLN: INTRAVENOUS | Status: DC

## 2020-03-31 NOTE — H&P (Signed)
Chief Complaint: Patient was seen in consultation today for random renal biopsy at the request of Korrapati,Madhu  Referring Physician(s): Korrapati,Madhu  Patient Status: [facility] - Out-pt  History of Present Illness: Martha [name] is a 74 y.o. female referred by Dr. Lanora Manis with nephrology for random renal biopsy due to worsening CKD and severe proteinuria. Presents today wheezing, dyspneic and hypoxic. Smokes at least 1 pack of cigarettes per day. Sees Dr. Ginette Pitman but admits that she rarely ever uses her prescribed inhalers. Not on home oxygen. O2 sat on RA today 85-88%. Increased to low 90's on 2L O2. Does not feel that she can lay on stomach for procedure.    Past Medical History:  Diagnosis Date  . Abnormal LFTs (liver function tests)   . Anxiety   . Arthritis    knees, feet  . Asthma   . Chronic bronchitis ([place])   . Chronic constipation   . Chronic low back pain   . Cigarette smoker    Has cut back Smoking to 1 pack every other day  . COPD (chronic obstructive pulmonary disease) ([place])   . Depression   . Diabetic neuropathy ([place])   . Diverticulitis 2015   gi recommended repeat scope in 10 years  . Essential hypertension   . Fatty liver   . GERD (gastroesophageal reflux disease)   . Gout   . History of mammogram 05/28/2013  . Hyperlipidemia   . Hypothyroidism   . Insomnia   . Iron deficiency anemia   . Low serum vitamin D   . Personal history of tobacco use, presenting hazards to health 09/29/2015  . Plantar fasciitis   . RLS (restless legs syndrome)   . Shortness of breath dyspnea   . Thyroid cancer ([place]) 2009  . Tremors of nervous system    "I think I have parkinson's disease"  . Type 2 diabetes mellitus ([place])   . Wears dentures    uppers    Past Surgical History:  Procedure Laterality Date  . CATARACT EXTRACTION W/PHACO Left 11/24/2015   Procedure: CATARACT EXTRACTION PHACO AND INTRAOCULAR LENS PLACEMENT (IOC);  Surgeon: Birder Robson, MD;  Location:  [facility] ORS;  Service: Ophthalmology;  Laterality: Left;  [place] 38.3 AP% 20.6 CDE 7.89 FLUID PACK LOT # [id number]  . CATARACT EXTRACTION W/PHACO Right 06/23/2019   Procedure: CATARACT EXTRACTION PHACO AND INTRAOCULAR LENS PLACEMENT ([place]) RIGHT DIABETIC;  Surgeon: Birder Robson, MD;  Location: [place];  Service: Ophthalmology;  Laterality: Right;  Diabetic  . COLONOSCOPY  2004, 2009, 2015   [place]  . POLYPECTOMY  2009  . THYROIDECTOMY  2009   states she was tx with radiactive iodine  . TUBAL LIGATION    . UPPER GASTROINTESTINAL ENDOSCOPY      Allergies: Nicotine polacrilex, Nicotrol [nicotine], and Codeine sulfate  Medications: Prior to Admission medications   Medication Sig Start Date End Date Taking? Authorizing Provider  alendronate (FOSAMAX) 70 MG tablet TAKE ONE TABLET BY MOUTH EACH WEEK, ON AN EMPTY STOMACH BEFORE BREAKFAST WITH 8oz OF WATER AND REMAIN UPRIGHT FOR :30 09/17/17   [provider]  aspirin EC 81 MG tablet Take 81 mg by mouth daily.     [provider]  cetirizine (ZYRTEC) 10 MG tablet Take 10 mg by mouth daily.     [provider]  colchicine 0.6 MG tablet 1 tab po bid prn 06/28/15   [provider]  Docusate Calcium (STOOL SOFTENER PO) Take 1 capsule by mouth as needed.  [provider]  ferrous sulfate 325 (65 FE) MG EC tablet Take 325 mg by mouth daily.  04/27/14 03/20/67  [provider]  fluticasone (FLONASE) 50 MCG/ACT nasal spray Place 2 sprays into both nostrils daily.    [provider]  gabapentin (NEURONTIN) 600 MG tablet Take 600 mg by mouth 3 (three) times daily.    [provider]  glipiZIDE (GLUCOTROL) 10 MG tablet Take 10 mg by mouth 2 (two) times daily.  12/10/14   [provider]  hydrochlorothiazide (HYDRODIURIL) 25 MG tablet Take 25 mg by mouth at bedtime.  02/25/15   [provider]  ketotifen (ZADITOR) 0.025 % ophthalmic solution Place 1 drop into  both eyes as needed.    [provider]  levothyroxine (SYNTHROID, LEVOTHROID) 150 MCG tablet Take 150 mcg by mouth daily before breakfast.  02/25/15   [provider]  losartan (COZAAR) 50 MG tablet Take 50 mg by mouth daily.  09/30/18   [provider]  metFORMIN (GLUCOPHAGE) 1000 MG tablet Take 1,000 mg by mouth 2 (two) times daily.  12/10/14   [provider]  primidone (MYSOLINE) 50 MG tablet 50 mg at bedtime.  08/22/16   [provider]  PROAIR HFA 108 (90 BASE) MCG/ACT inhaler Inhale 2 Inhalers into the lungs every 6 (six) hours as needed for wheezing or shortness of breath.  12/03/14   [provider]  rosuvastatin (CRESTOR) 5 MG tablet Take 5 mg by mouth daily at 6 PM.  05/07/18 06/23/19  [provider]  sertraline (ZOLOFT) 100 MG tablet Take 100 mg by mouth daily.    [provider]  tiZANidine (ZANAFLEX) 2 MG tablet TAKE 1 TABLET BY MOUTH 3 TIMES DAILY AS NEEDED FOR MUSCLE SPASMS 12/11/17   [provider]  traMADol (ULTRAM) 50 MG tablet Take 1 tablet (50 mg total) by mouth every 6 (six) hours as needed. Patient not taking: Reported on 06/15/2019 03/25/18   Norval Gable, MD  valsartan (DIOVAN) 160 MG tablet Take 160 mg by mouth daily.  12/15/14   [provider]  vitamin B-12 (CYANOCOBALAMIN) 1000 MCG tablet Take 1,000 mcg by mouth daily.    [provider]  vitamin C (ASCORBIC ACID) 500 MG tablet Take 500 mg by mouth daily.    [provider]  Vitamin D, Ergocalciferol, (DRISDOL) 50000 UNITS CAPS capsule Take 50,000 Units by mouth every 7 (seven) days. Tuesday 01/04/15   [provider]     Family History  Problem Relation Age of Onset  . Diabetes Mellitus II Father   . Lung disease Father   . Diabetes Mellitus II Mother   . Arthritis Mother   . Thyroid disease Other   . Asthma Sister   . Emphysema Sister   . Hypertension Sister   . Breast cancer Paternal Aunt     Social  History   Socioeconomic History  . Marital status: Divorced    Spouse name: Not on file  . Number of children: Not on file  . Years of education: Not on file  . Highest education level: Not on file  Occupational History  . Not on file  Tobacco Use  . Smoking status: Current Every Day Smoker    Packs/day: 1.00    Years: 39.50    Pack years: 39.50    Types: Cigarettes  . Smokeless tobacco: Never Used  . Tobacco comment: curently smokes 12 cigerattes a day. I'm trying to cut back.  Vaping Use  .  Vaping Use: Never used  Substance and Sexual Activity  . Alcohol use: No    Alcohol/week: 0.0 standard drinks  . Drug use: No  . Sexual activity: Never  Other Topics Concern  . Not on file  Social History Narrative  . Not on file   Social Determinants of Health   Financial Resource Strain:   . Difficulty of Paying Living Expenses: Not on file  Food Insecurity:   . Worried About Charity fundraiser in the Last Year: Not on file  . Ran Out of Food in the Last Year: Not on file  Transportation Needs:   . Lack of Transportation (Medical): Not on file  . Lack of Transportation (Non-Medical): Not on file  Physical Activity:   . Days of Exercise per Week: Not on file  . Minutes of Exercise per Session: Not on file  Stress:   . Feeling of Stress : Not on file  Social Connections:   . Frequency of Communication with Friends and Family: Not on file  . Frequency of Social Gatherings with Friends and Family: Not on file  . Attends Religious Services: Not on file  . Active Member of Clubs or Organizations: Not on file  . Attends Archivist Meetings: Not on file  . Marital Status: Not on file   Review of Systems: A 12 point ROS discussed and pertinent positives are indicated in the HPI above.  All other systems are negative.  Review of Systems  Constitutional: Positive for fatigue.  Respiratory: Positive for shortness of breath and wheezing.   Cardiovascular: Positive for  leg swelling.  Gastrointestinal: Negative.   Genitourinary: Negative.   Musculoskeletal: Positive for arthralgias, gait problem and joint swelling.  Neurological:       No neurological symptoms.    Vital Signs: BP (!) 155/75   Pulse 94   Temp 97.8 F (36.6 C) (Oral)   Resp (!) 27   Ht 5' (1.524 m)   Wt 72.6 kg   SpO2 93%   BMI 31.26 kg/m   Physical Exam Constitutional:      General: She is in acute distress.     Appearance: She is obese. She is ill-appearing and toxic-appearing.  Cardiovascular:     Rate and Rhythm: Regular rhythm. Tachycardia present.     Heart sounds: Murmur heard.  No gallop.   Pulmonary:     [place]: Respiratory distress present.     Breath sounds: Stridor present. Wheezing and rales present.  Abdominal:     General: Bowel sounds are normal.  Musculoskeletal:        General: Swelling present.     Cervical back: Neck supple.  Skin:    Comments: Discolored fingers with rubor and soot/burn discoloration of fingertips.  Neurological:     Mental Status: She is alert.     Imaging: No results found.  Labs:  CBC: Recent Labs    05/05/19 1501 11/03/19 1555 03/31/20 0900  WBC 7.4 11.4* 12.7*  HGB 14.5 14.9 16.9*  HCT 43.7 44.9 52.6*  PLT 227 273 246    COAGS: Recent Labs    03/31/20 0900  INR 1.0    Assessment and Plan:  Martha [name] is not in an adequate state currently to be able to tolerate a biopsy procedure under moderate conscious sedation. Her COPD is clearly not under good control and she is on the verge of acute respiratory failure. She cannot be sedated safely and would likely not tolerate a bleeding complication  from biopsy. I have explained this to her and her daughter. I will message Drs. Korrapati and Hande. She may have to be hospitalized if renal biopsy needs to be performed in the future.  Thank you for this interesting consult.  I greatly enjoyed meeting Martha [name] and look forward to participating in their care.   A copy of this report was sent to the requesting provider on this date.  Electronically Signed: Azzie Roup, MD 03/31/2020, 10:05 AM     I spent a total of 30 Minutes in face to face in clinical consultation, greater than 50% of which was counseling/coordinating care for renal biopsy.

## 2020-04-03 ENCOUNTER — Emergency Department: Payer: Medicare Other

## 2020-04-03 ENCOUNTER — Encounter: Payer: Self-pay | Admitting: Emergency Medicine

## 2020-04-03 ENCOUNTER — Inpatient Hospital Stay
Admission: EM | Admit: 2020-04-03 | Discharge: 2020-04-21 | DRG: 291 | Disposition: A | Discharge status: Hospice | Payer: Medicare Other | Attending: Internal Medicine | Admitting: Internal Medicine

## 2020-04-03 ENCOUNTER — Inpatient Hospital Stay: Payer: Medicare Other

## 2020-04-03 ENCOUNTER — Other Ambulatory Visit: Payer: Self-pay

## 2020-04-03 DIAGNOSIS — B9620 Unspecified Escherichia coli [E. coli] as the cause of diseases classified elsewhere: Secondary | ICD-10-CM | POA: Diagnosis present

## 2020-04-03 DIAGNOSIS — L03119 Cellulitis of unspecified part of limb: Secondary | ICD-10-CM | POA: Diagnosis not present

## 2020-04-03 DIAGNOSIS — R042 Hemoptysis: Secondary | ICD-10-CM | POA: Diagnosis present

## 2020-04-03 DIAGNOSIS — E119 Type 2 diabetes mellitus without complications: Secondary | ICD-10-CM

## 2020-04-03 DIAGNOSIS — N390 Urinary tract infection, site not specified: Secondary | ICD-10-CM | POA: Diagnosis present

## 2020-04-03 DIAGNOSIS — J690 Pneumonitis due to inhalation of food and vomit: Secondary | ICD-10-CM | POA: Diagnosis not present

## 2020-04-03 DIAGNOSIS — J9622 Acute and chronic respiratory failure with hypercapnia: Secondary | ICD-10-CM | POA: Diagnosis not present

## 2020-04-03 DIAGNOSIS — Z79899 Other long term (current) drug therapy: Secondary | ICD-10-CM

## 2020-04-03 DIAGNOSIS — D751 Secondary polycythemia: Secondary | ICD-10-CM | POA: Diagnosis present

## 2020-04-03 DIAGNOSIS — I10 Essential (primary) hypertension: Secondary | ICD-10-CM | POA: Diagnosis not present

## 2020-04-03 DIAGNOSIS — Z79890 Hormone replacement therapy (postmenopausal): Secondary | ICD-10-CM

## 2020-04-03 DIAGNOSIS — Z6824 Body mass index (BMI) 24.0-24.9, adult: Secondary | ICD-10-CM

## 2020-04-03 DIAGNOSIS — I248 Other forms of acute ischemic heart disease: Secondary | ICD-10-CM | POA: Diagnosis present

## 2020-04-03 DIAGNOSIS — N049 Nephrotic syndrome with unspecified morphologic changes: Secondary | ICD-10-CM | POA: Diagnosis present

## 2020-04-03 DIAGNOSIS — R809 Proteinuria, unspecified: Secondary | ICD-10-CM | POA: Diagnosis not present

## 2020-04-03 DIAGNOSIS — E861 Hypovolemia: Secondary | ICD-10-CM | POA: Diagnosis present

## 2020-04-03 DIAGNOSIS — I5033 Acute on chronic diastolic (congestive) heart failure: Secondary | ICD-10-CM | POA: Diagnosis present

## 2020-04-03 DIAGNOSIS — R109 Unspecified abdominal pain: Secondary | ICD-10-CM

## 2020-04-03 DIAGNOSIS — I83019 Varicose veins of right lower extremity with ulcer of unspecified site: Secondary | ICD-10-CM | POA: Diagnosis present

## 2020-04-03 DIAGNOSIS — G9340 Encephalopathy, unspecified: Secondary | ICD-10-CM | POA: Diagnosis not present

## 2020-04-03 DIAGNOSIS — Z833 Family history of diabetes mellitus: Secondary | ICD-10-CM

## 2020-04-03 DIAGNOSIS — I878 Other specified disorders of veins: Secondary | ICD-10-CM | POA: Diagnosis present

## 2020-04-03 DIAGNOSIS — F329 Major depressive disorder, single episode, unspecified: Secondary | ICD-10-CM | POA: Diagnosis present

## 2020-04-03 DIAGNOSIS — G2581 Restless legs syndrome: Secondary | ICD-10-CM | POA: Diagnosis present

## 2020-04-03 DIAGNOSIS — E876 Hypokalemia: Secondary | ICD-10-CM | POA: Diagnosis not present

## 2020-04-03 DIAGNOSIS — E1140 Type 2 diabetes mellitus with diabetic neuropathy, unspecified: Secondary | ICD-10-CM | POA: Diagnosis present

## 2020-04-03 DIAGNOSIS — M109 Gout, unspecified: Secondary | ICD-10-CM | POA: Diagnosis present

## 2020-04-03 DIAGNOSIS — J9621 Acute and chronic respiratory failure with hypoxia: Secondary | ICD-10-CM | POA: Diagnosis present

## 2020-04-03 DIAGNOSIS — R7989 Other specified abnormal findings of blood chemistry: Secondary | ICD-10-CM | POA: Diagnosis not present

## 2020-04-03 DIAGNOSIS — Z20822 Contact with and (suspected) exposure to covid-19: Secondary | ICD-10-CM | POA: Diagnosis present

## 2020-04-03 DIAGNOSIS — Z825 Family history of asthma and other chronic lower respiratory diseases: Secondary | ICD-10-CM

## 2020-04-03 DIAGNOSIS — G928 Other toxic encephalopathy: Secondary | ICD-10-CM | POA: Diagnosis not present

## 2020-04-03 DIAGNOSIS — Z720 Tobacco use: Secondary | ICD-10-CM

## 2020-04-03 DIAGNOSIS — I5031 Acute diastolic (congestive) heart failure: Secondary | ICD-10-CM | POA: Diagnosis not present

## 2020-04-03 DIAGNOSIS — J810 Acute pulmonary edema: Secondary | ICD-10-CM | POA: Diagnosis present

## 2020-04-03 DIAGNOSIS — J9601 Acute respiratory failure with hypoxia: Secondary | ICD-10-CM

## 2020-04-03 DIAGNOSIS — I872 Venous insufficiency (chronic) (peripheral): Secondary | ICD-10-CM | POA: Diagnosis present

## 2020-04-03 DIAGNOSIS — F419 Anxiety disorder, unspecified: Secondary | ICD-10-CM | POA: Diagnosis present

## 2020-04-03 DIAGNOSIS — E874 Mixed disorder of acid-base balance: Secondary | ICD-10-CM | POA: Diagnosis not present

## 2020-04-03 DIAGNOSIS — L03116 Cellulitis of left lower limb: Secondary | ICD-10-CM | POA: Diagnosis present

## 2020-04-03 DIAGNOSIS — F32A Depression, unspecified: Secondary | ICD-10-CM | POA: Diagnosis present

## 2020-04-03 DIAGNOSIS — Z8249 Family history of ischemic heart disease and other diseases of the circulatory system: Secondary | ICD-10-CM

## 2020-04-03 DIAGNOSIS — Z7984 Long term (current) use of oral hypoglycemic drugs: Secondary | ICD-10-CM

## 2020-04-03 DIAGNOSIS — J441 Chronic obstructive pulmonary disease with (acute) exacerbation: Secondary | ICD-10-CM | POA: Diagnosis present

## 2020-04-03 DIAGNOSIS — N181 Chronic kidney disease, stage 1: Secondary | ICD-10-CM | POA: Diagnosis present

## 2020-04-03 DIAGNOSIS — Z515 Encounter for palliative care: Secondary | ICD-10-CM

## 2020-04-03 DIAGNOSIS — Z888 Allergy status to other drugs, medicaments and biological substances status: Secondary | ICD-10-CM

## 2020-04-03 DIAGNOSIS — J449 Chronic obstructive pulmonary disease, unspecified: Secondary | ICD-10-CM | POA: Diagnosis not present

## 2020-04-03 DIAGNOSIS — G252 Other specified forms of tremor: Secondary | ICD-10-CM | POA: Diagnosis present

## 2020-04-03 DIAGNOSIS — I509 Heart failure, unspecified: Secondary | ICD-10-CM | POA: Diagnosis not present

## 2020-04-03 DIAGNOSIS — L03115 Cellulitis of right lower limb: Secondary | ICD-10-CM | POA: Diagnosis present

## 2020-04-03 DIAGNOSIS — Z66 Do not resuscitate: Secondary | ICD-10-CM | POA: Diagnosis not present

## 2020-04-03 DIAGNOSIS — F17210 Nicotine dependence, cigarettes, uncomplicated: Secondary | ICD-10-CM | POA: Diagnosis present

## 2020-04-03 DIAGNOSIS — R251 Tremor, unspecified: Secondary | ICD-10-CM

## 2020-04-03 DIAGNOSIS — I130 Hypertensive heart and chronic kidney disease with heart failure and stage 1 through stage 4 chronic kidney disease, or unspecified chronic kidney disease: Secondary | ICD-10-CM | POA: Diagnosis present

## 2020-04-03 DIAGNOSIS — E785 Hyperlipidemia, unspecified: Secondary | ICD-10-CM | POA: Diagnosis present

## 2020-04-03 DIAGNOSIS — E1122 Type 2 diabetes mellitus with diabetic chronic kidney disease: Secondary | ICD-10-CM | POA: Diagnosis present

## 2020-04-03 DIAGNOSIS — R059 Cough, unspecified: Secondary | ICD-10-CM

## 2020-04-03 DIAGNOSIS — Z7189 Other specified counseling: Secondary | ICD-10-CM | POA: Diagnosis not present

## 2020-04-03 DIAGNOSIS — E440 Moderate protein-calorie malnutrition: Secondary | ICD-10-CM | POA: Diagnosis present

## 2020-04-03 DIAGNOSIS — Z85850 Personal history of malignant neoplasm of thyroid: Secondary | ICD-10-CM

## 2020-04-03 DIAGNOSIS — E039 Hypothyroidism, unspecified: Secondary | ICD-10-CM | POA: Diagnosis not present

## 2020-04-03 DIAGNOSIS — Z8261 Family history of arthritis: Secondary | ICD-10-CM

## 2020-04-03 DIAGNOSIS — T501X5A Adverse effect of loop [high-ceiling] diuretics, initial encounter: Secondary | ICD-10-CM | POA: Diagnosis not present

## 2020-04-03 DIAGNOSIS — G9341 Metabolic encephalopathy: Secondary | ICD-10-CM | POA: Diagnosis not present

## 2020-04-03 DIAGNOSIS — R1312 Dysphagia, oropharyngeal phase: Secondary | ICD-10-CM | POA: Diagnosis present

## 2020-04-03 DIAGNOSIS — J439 Emphysema, unspecified: Secondary | ICD-10-CM | POA: Diagnosis present

## 2020-04-03 DIAGNOSIS — I83029 Varicose veins of left lower extremity with ulcer of unspecified site: Secondary | ICD-10-CM | POA: Diagnosis present

## 2020-04-03 DIAGNOSIS — R0902 Hypoxemia: Secondary | ICD-10-CM

## 2020-04-03 DIAGNOSIS — K219 Gastro-esophageal reflux disease without esophagitis: Secondary | ICD-10-CM | POA: Diagnosis present

## 2020-04-03 LAB — COMPREHENSIVE METABOLIC PANEL
ALT: 11 U/L (ref 0–44)
AST: 19 U/L (ref 15–41)
Albumin: 2.7 g/dL — ABNORMAL LOW (ref 3.5–5.0)
Alkaline Phosphatase: 89 U/L (ref 38–126)
Anion gap: 9 (ref 5–15)
BUN: 16 mg/dL (ref 8–23)
CO2: 28 mmol/L (ref 22–32)
Calcium: 8.1 mg/dL — ABNORMAL LOW (ref 8.9–10.3)
Chloride: 95 mmol/L — ABNORMAL LOW (ref 98–111)
Creatinine, Ser: 0.86 mg/dL (ref 0.44–1.00)
GFR, Estimated: >60 mL/min (ref >60)
Glucose, Bld: 156 mg/dL — ABNORMAL HIGH (ref 70–99)
Potassium: 4.5 mmol/L (ref 3.5–5.1)
Sodium: 132 mmol/L — ABNORMAL LOW (ref 135–145)
Total Bilirubin: 0.4 mg/dL (ref 0.3–1.2)
Total Protein: 6.7 g/dL (ref 6.5–8.1)

## 2020-04-03 LAB — URINALYSIS, COMPLETE (UACMP) WITH MICROSCOPIC
Bilirubin Urine: NEGATIVE
Glucose, UA: NEGATIVE mg/dL
Ketones, ur: NEGATIVE mg/dL
Nitrite: POSITIVE — AB
Protein, ur: 100 mg/dL — AB
Specific Gravity, Urine: 1.01 (ref 1.005–1.030)
WBC, UA: >50 WBC/hpf — ABNORMAL HIGH (ref 0–5)
pH: 6 (ref 5.0–8.0)

## 2020-04-03 LAB — CBC WITH DIFFERENTIAL/PLATELET
Abs Immature Granulocytes: 0.04 10*3/uL (ref 0.00–0.07)
Basophils Absolute: 0.1 10*3/uL (ref 0.0–0.1)
Basophils Relative: 1 %
Eosinophils Absolute: 0.1 10*3/uL (ref 0.0–0.5)
Eosinophils Relative: 1 %
HCT: 53.7 % — ABNORMAL HIGH (ref 36.0–46.0)
Hemoglobin: 17.5 g/dL — ABNORMAL HIGH (ref 12.0–15.0)
Immature Granulocytes: 0 %
Lymphocytes Relative: 6 %
Lymphs Abs: 0.6 10*3/uL — ABNORMAL LOW (ref 0.7–4.0)
MCH: 29.2 pg (ref 26.0–34.0)
MCHC: 32.6 g/dL (ref 30.0–36.0)
MCV: 89.6 fL (ref 80.0–100.0)
Monocytes Absolute: 0.5 10*3/uL (ref 0.1–1.0)
Monocytes Relative: 5 %
Neutro Abs: 9.4 10*3/uL — ABNORMAL HIGH (ref 1.7–7.7)
Neutrophils Relative %: 87 %
Platelets: 296 10*3/uL (ref 150–400)
RBC: 5.99 MIL/uL — ABNORMAL HIGH (ref 3.87–5.11)
RDW: 20.1 % — ABNORMAL HIGH (ref 11.5–15.5)
WBC: 10.7 10*3/uL — ABNORMAL HIGH (ref 4.0–10.5)
nRBC: 0 % (ref 0.0–0.2)

## 2020-04-03 LAB — BLOOD GAS, VENOUS
Acid-Base Excess: 6.2 mmol/L — ABNORMAL HIGH (ref 0.0–2.0)
Bicarbonate: 31.9 mmol/L — ABNORMAL HIGH (ref 20.0–28.0)
O2 Saturation: 86.8 %
Patient temperature: 37
pCO2, Ven: 48 mmHg (ref 44.0–60.0)
pH, Ven: 7.43 (ref 7.250–7.430)
pO2, Ven: 51 mmHg — ABNORMAL HIGH (ref 32.0–45.0)

## 2020-04-03 LAB — GLUCOSE, CAPILLARY: Glucose-Capillary: 214 mg/dL — ABNORMAL HIGH (ref 70–99)

## 2020-04-03 LAB — RESP PANEL BY RT-PCR (FLU A&B, COVID) ARPGX2
Influenza A by PCR: NEGATIVE
Influenza B by PCR: NEGATIVE
SARS Coronavirus 2 by RT PCR: NEGATIVE

## 2020-04-03 LAB — PROCALCITONIN: Procalcitonin: <0.1 ng/mL

## 2020-04-03 LAB — TROPONIN I (HIGH SENSITIVITY): Troponin I (High Sensitivity): 10 ng/L (ref <18)

## 2020-04-03 LAB — CBG MONITORING, ED: Glucose-Capillary: 127 mg/dL — ABNORMAL HIGH (ref 70–99)

## 2020-04-03 LAB — HEMOGLOBIN A1C
Hgb A1c MFr Bld: 6.5 % — ABNORMAL HIGH (ref 4.8–5.6)
Mean Plasma Glucose: 139.85 mg/dL

## 2020-04-03 LAB — SEDIMENTATION RATE: Sed Rate: 24 mm/hr (ref 0–30)

## 2020-04-03 LAB — BRAIN NATRIURETIC PEPTIDE: B Natriuretic Peptide: 1113.8 pg/mL — ABNORMAL HIGH (ref 0.0–100.0)

## 2020-04-03 MED ORDER — HYDRALAZINE HCL 20 MG/ML IJ SOLN
5.0000 mg | INTRAMUSCULAR | Status: DC | PRN
  Administered 2020-04-03 – 2020-04-07 (×3): 5 mg via INTRAVENOUS
  Filled 2020-04-03 (×4): qty 1

## 2020-04-03 MED ORDER — VARENICLINE TARTRATE 0.5 MG PO TABS
0.5000 mg | ORAL_TABLET | Freq: Every day | ORAL | Status: DC
  Filled 2020-04-03: qty 1

## 2020-04-03 MED ORDER — ACETAMINOPHEN 325 MG PO TABS: 650.0000 mg | ORAL_TABLET | Freq: Four times a day (QID) | ORAL | Status: DC | PRN

## 2020-04-03 MED ORDER — ALBUTEROL SULFATE HFA 108 (90 BASE) MCG/ACT IN AERS: 2.0000 | INHALATION_SPRAY | RESPIRATORY_TRACT | Status: DC | PRN

## 2020-04-03 MED ORDER — FUROSEMIDE 10 MG/ML IJ SOLN
40.0000 mg | Freq: Two times a day (BID) | INTRAMUSCULAR | Status: DC
  Administered 2020-04-04: 40 mg via INTRAVENOUS
  Filled 2020-04-03: qty 4

## 2020-04-03 MED ORDER — ALBUTEROL SULFATE (2.5 MG/3ML) 0.083% IN NEBU
2.5000 mg | INHALATION_SOLUTION | RESPIRATORY_TRACT | Status: DC | PRN
  Administered 2020-04-18: 20:00:00 2.5 mg via RESPIRATORY_TRACT
  Filled 2020-04-03: qty 3

## 2020-04-03 MED ORDER — MORPHINE SULFATE (PF) 2 MG/ML IV SOLN
1.0000 mg | INTRAVENOUS | Status: DC | PRN
  Administered 2020-04-03 (×2): 1 mg via INTRAVENOUS
  Filled 2020-04-03 (×2): qty 1

## 2020-04-03 MED ORDER — IPRATROPIUM-ALBUTEROL 0.5-2.5 (3) MG/3ML IN SOLN
3.0000 mL | RESPIRATORY_TRACT | Status: DC
  Administered 2020-04-03 – 2020-04-08 (×27): 3 mL via RESPIRATORY_TRACT
  Filled 2020-04-03 (×26): qty 3

## 2020-04-03 MED ORDER — FUROSEMIDE 10 MG/ML IJ SOLN
80.0000 mg | Freq: Once | INTRAMUSCULAR | Status: AC
  Administered 2020-04-03: 80 mg via INTRAVENOUS
  Filled 2020-04-03: qty 8

## 2020-04-03 MED ORDER — SODIUM CHLORIDE 0.9 % IV SOLN
1.0000 g | INTRAVENOUS | Status: DC
  Administered 2020-04-03: 1 g via INTRAVENOUS
  Filled 2020-04-03 (×2): qty 10

## 2020-04-03 MED ORDER — INSULIN ASPART 100 UNIT/ML ~~LOC~~ SOLN
0.0000 [IU] | Freq: Every day | SUBCUTANEOUS | Status: DC
  Administered 2020-04-04: 3 [IU] via SUBCUTANEOUS
  Administered 2020-04-09 – 2020-04-10 (×2): 2 [IU] via SUBCUTANEOUS
  Administered 2020-04-11: 22:00:00 3 [IU] via SUBCUTANEOUS
  Administered 2020-04-12 – 2020-04-18 (×7): 2 [IU] via SUBCUTANEOUS
  Filled 2020-04-03 (×11): qty 1

## 2020-04-03 MED ORDER — ACETAMINOPHEN 500 MG PO TABS
1000.0000 mg | ORAL_TABLET | Freq: Once | ORAL | Status: AC
  Administered 2020-04-03: 1000 mg via ORAL
  Filled 2020-04-03: qty 2

## 2020-04-03 MED ORDER — OXYCODONE-ACETAMINOPHEN 5-325 MG PO TABS
1.0000 | ORAL_TABLET | Freq: Four times a day (QID) | ORAL | Status: DC | PRN
  Administered 2020-04-03 – 2020-04-06 (×4): 1 via ORAL
  Filled 2020-04-03 (×4): qty 1

## 2020-04-03 MED ORDER — INSULIN ASPART 100 UNIT/ML ~~LOC~~ SOLN
0.0000 [IU] | Freq: Three times a day (TID) | SUBCUTANEOUS | Status: DC
  Administered 2020-04-03: 1 [IU] via SUBCUTANEOUS
  Administered 2020-04-04 – 2020-04-05 (×3): 2 [IU] via SUBCUTANEOUS
  Administered 2020-04-05: 1 [IU] via SUBCUTANEOUS
  Administered 2020-04-05: 2 [IU] via SUBCUTANEOUS
  Administered 2020-04-06: 3 [IU] via SUBCUTANEOUS
  Administered 2020-04-06 (×2): 1 [IU] via SUBCUTANEOUS
  Administered 2020-04-07: 2 [IU] via SUBCUTANEOUS
  Administered 2020-04-07: 5 [IU] via SUBCUTANEOUS
  Administered 2020-04-08: 3 [IU] via SUBCUTANEOUS
  Administered 2020-04-08: 2 [IU] via SUBCUTANEOUS
  Administered 2020-04-09 – 2020-04-10 (×6): 3 [IU] via SUBCUTANEOUS
  Administered 2020-04-11: 17:00:00 5 [IU] via SUBCUTANEOUS
  Administered 2020-04-11: 12:00:00 3 [IU] via SUBCUTANEOUS
  Administered 2020-04-11: 09:00:00 5 [IU] via SUBCUTANEOUS
  Administered 2020-04-12: 12:00:00 7 [IU] via SUBCUTANEOUS
  Administered 2020-04-12: 17:00:00 2 [IU] via SUBCUTANEOUS
  Administered 2020-04-12: 08:00:00 7 [IU] via SUBCUTANEOUS
  Administered 2020-04-13 (×2): 1 [IU] via SUBCUTANEOUS
  Administered 2020-04-13: 10:00:00 7 [IU] via SUBCUTANEOUS
  Administered 2020-04-14: 18:00:00 5 [IU] via SUBCUTANEOUS
  Administered 2020-04-14: 09:00:00 2 [IU] via SUBCUTANEOUS
  Administered 2020-04-14: 13:00:00 3 [IU] via SUBCUTANEOUS
  Administered 2020-04-15: 09:00:00 2 [IU] via SUBCUTANEOUS
  Administered 2020-04-15: 13:00:00 5 [IU] via SUBCUTANEOUS
  Administered 2020-04-15: 17:00:00 9 [IU] via SUBCUTANEOUS
  Administered 2020-04-16: 09:00:00 1 [IU] via SUBCUTANEOUS
  Administered 2020-04-16 (×2): 7 [IU] via SUBCUTANEOUS
  Administered 2020-04-17: 11:00:00 1 [IU] via SUBCUTANEOUS
  Administered 2020-04-17 (×2): 2 [IU] via SUBCUTANEOUS
  Administered 2020-04-18: 18:00:00 3 [IU] via SUBCUTANEOUS
  Administered 2020-04-18: 13:00:00 1 [IU] via SUBCUTANEOUS
  Administered 2020-04-19: 12:00:00 2 [IU] via SUBCUTANEOUS
  Administered 2020-04-19: 17:00:00 7 [IU] via SUBCUTANEOUS
  Administered 2020-04-20: 12:00:00 3 [IU] via SUBCUTANEOUS
  Filled 2020-04-03 (×46): qty 1

## 2020-04-03 MED ORDER — FENTANYL CITRATE (PF) 100 MCG/2ML IJ SOLN
12.5000 ug | INTRAMUSCULAR | Status: DC | PRN
  Administered 2020-04-04 – 2020-04-09 (×13): 12.5 ug via INTRAVENOUS
  Filled 2020-04-03 (×14): qty 2

## 2020-04-03 MED ORDER — DM-GUAIFENESIN ER 30-600 MG PO TB12
1.0000 | ORAL_TABLET | Freq: Two times a day (BID) | ORAL | Status: DC | PRN
  Administered 2020-04-06 – 2020-04-18 (×3): 1 via ORAL
  Filled 2020-04-03 (×3): qty 1

## 2020-04-03 MED ORDER — ONDANSETRON HCL 4 MG/2ML IJ SOLN
4.0000 mg | Freq: Three times a day (TID) | INTRAMUSCULAR | Status: DC | PRN
  Administered 2020-04-05 – 2020-04-20 (×8): 4 mg via INTRAVENOUS
  Filled 2020-04-03 (×10): qty 2

## 2020-04-03 MED ORDER — METHYLPREDNISOLONE SODIUM SUCC 40 MG IJ SOLR
40.0000 mg | Freq: Two times a day (BID) | INTRAMUSCULAR | Status: DC
  Administered 2020-04-03 – 2020-04-05 (×4): 40 mg via INTRAVENOUS
  Filled 2020-04-03 (×4): qty 1

## 2020-04-03 NOTE — ED Notes (Signed)
Changed purewick and provided peri care. Pt had BM. Pt states feeling kay.

## 2020-04-03 NOTE — ED Notes (Signed)
Pharm tech will communicate with main pharmacy to get meds verified. Unable to give PRN morphine or scheduled meds at this time because meds have not been verified yet.

## 2020-04-03 NOTE — ED Notes (Signed)
Advised nurse that patient has assigned bed 

## 2020-04-03 NOTE — H&P (Signed)
History and Physical    [name] JQG:[id number] DOB: 04/16/1946 DOA: 04/03/2020  Referring MD/NP/PA:   PCP: Tracie Harrier, MD   Patient coming from:  The patient is coming from home.  At baseline, pt is independent for most of ADL.        Chief Complaint: Bilateral lower leg edema, pain and SOB  HPI: Martha [name] is a 74 y.o. female with medical history significant of  HTN, HLD, DM, COPD, tobacco abuse, hypothyroidism, gout, depression, tremor, proteinuria, depression, anxiety, GERD, diverticulitis, who presents with bilateral leg edema and pain, and SOB.   Patient states that she has chronic bilateral leg edema which has been progressively worsened recently. Both lower legs are painful and erythematous. She states that she gets her lower extremities wrapped once a week.  She has chronic cough and shortness of breath which has slightly worsened on exertion.  She coughs up some white mucus.  No fever or chills.  No chest pain.  Patient denies nausea vomiting, diarrhea, abdominal pain, symptoms of UTI.  No hematuria. Patient was found to have oxygen desaturation to 83% on room air, patient does not wear oxygen at home.  Patient was placed on 4 L oxygen, still has oxygen desaturation to upper 80s, BiPAP started in ED. Of note, pt has proteinuria, is currently being worked up by nephrology on outpatient basis, planning for biopsy.    ED Course: pt was found to have BNP 1113, troponin level 10, negative Covid PCR, creatinine 0.86, BUN 16, GFR>60, temperature normal, blood pressure 169/89, heart rate 100, RR 21, chest x-ray showed borderline cardiomegaly and mild pulmonary edema.  Patient is admitted to progressive bed as inpatient.  Cardiology, Dr. Clayborn Bigness is consulted.  Review of Systems:   General: no fevers, chills, no body weight gain,  has fatigue HEENT: no blurry vision, hearing changes or sore throat Respiratory: has dyspnea, coughing, wheezing CV: no chest pain, no  palpitations GI: no nausea, vomiting, abdominal pain, diarrhea, constipation GU: no dysuria, burning on urination, increased urinary frequency, hematuria  Ext: has leg edema and pain Neuro: no unilateral weakness, numbness, or tingling, no vision change or hearing loss Skin: no rash, no skin tear. MSK: No muscle spasm, no deformity, no limitation of range of movement in spin Heme: No easy bruising.  Travel history: No recent long distant travel.  Allergy:  Allergies  Allergen Reactions  . Nicotine Polacrilex Cough    Onset 04/29/2006.  Marland Kitchen Nicotrol [Nicotine] Cough  . Codeine Sulfate Itching    Onset 11/24/1998. tingling    Past Medical History:  Diagnosis Date  . Abnormal LFTs (liver function tests)   . Anxiety   . Arthritis    knees, feet  . Asthma   . Chronic bronchitis ([place])   . Chronic constipation   . Chronic low back pain   . Cigarette smoker    Has cut back Smoking to 1 pack every other day  . COPD (chronic obstructive pulmonary disease) ([place])   . Depression   . Diabetic neuropathy ([place])   . Diverticulitis 2015   gi recommended repeat scope in 10 years  . Essential hypertension   . Fatty liver   . GERD (gastroesophageal reflux disease)   . Gout   . History of mammogram 05/28/2013  . Hyperlipidemia   . Hypothyroidism   . Insomnia   . Iron deficiency anemia   . Low serum vitamin D   . Personal history of tobacco use, presenting hazards to health  09/29/2015  . Plantar fasciitis   . RLS (restless legs syndrome)   . Shortness of breath dyspnea   . Thyroid cancer ([place]) 2009  . Tremors of nervous system    "I think I have parkinson's disease"  . Type 2 diabetes mellitus ([place])   . Wears dentures    uppers    Past Surgical History:  Procedure Laterality Date  . CATARACT EXTRACTION W/PHACO Left 11/24/2015   Procedure: CATARACT EXTRACTION PHACO AND INTRAOCULAR LENS PLACEMENT (IOC);  Surgeon: Birder Robson, MD;  Location: [facility];  Service: Ophthalmology;   Laterality: Left;  [place] 38.3 AP% 20.6 CDE 7.89 FLUID PACK LOT # [id number]  . CATARACT EXTRACTION W/PHACO Right 06/23/2019   Procedure: CATARACT EXTRACTION PHACO AND INTRAOCULAR LENS PLACEMENT ([place]) RIGHT DIABETIC;  Surgeon: Birder Robson, MD;  Location: [facility];  Service: Ophthalmology;  Laterality: Right;  Diabetic  . COLONOSCOPY  2004, 2009, 2015   [place]  . POLYPECTOMY  2009  . THYROIDECTOMY  2009   states she was tx with radiactive iodine  . TUBAL LIGATION    . UPPER GASTROINTESTINAL ENDOSCOPY      Social History:  reports that she has been smoking cigarettes. She has a 39.50 pack-year smoking history. She has never used smokeless tobacco. She reports that she does not drink alcohol and does not use drugs.  Family History:  Family History  Problem Relation Age of Onset  . Diabetes Mellitus II Father   . Lung disease Father   . Diabetes Mellitus II Mother   . Arthritis Mother   . Thyroid disease Other   . Asthma Sister   . Emphysema Sister   . Hypertension Sister   . Breast cancer Paternal Aunt      Prior to Admission medications   Medication Sig Start Date End Date Taking? Authorizing Provider  alendronate (FOSAMAX) 70 MG tablet TAKE ONE TABLET BY MOUTH EACH WEEK, ON AN EMPTY STOMACH BEFORE BREAKFAST WITH 8oz OF WATER AND REMAIN UPRIGHT FOR :30 09/17/17   [provider]  aspirin EC 81 MG tablet Take 81 mg by mouth daily.     [provider]  cetirizine (ZYRTEC) 10 MG tablet Take 10 mg by mouth daily.     [provider]  colchicine 0.6 MG tablet 1 tab po bid prn 06/28/15   [provider]  Docusate Calcium (STOOL SOFTENER PO) Take 1 capsule by mouth as needed.     [provider]  ferrous sulfate 325 (65 FE) MG EC tablet Take 325 mg by mouth daily.  04/27/14 03/20/67  [provider]  fluticasone (FLONASE) 50 MCG/ACT nasal spray Place 2 sprays into both nostrils daily.    [provider]  gabapentin  (NEURONTIN) 600 MG tablet Take 600 mg by mouth 3 (three) times daily.    [provider]  glipiZIDE (GLUCOTROL) 10 MG tablet Take 10 mg by mouth 2 (two) times daily.  12/10/14   [provider]  hydrochlorothiazide (HYDRODIURIL) 25 MG tablet Take 25 mg by mouth at bedtime.  02/25/15   [provider]  ketotifen (ZADITOR) 0.025 % ophthalmic solution Place 1 drop into both eyes as needed.    [provider]  levothyroxine (SYNTHROID, LEVOTHROID) 150 MCG tablet Take 150 mcg by mouth daily before breakfast.  02/25/15   [provider]  losartan (COZAAR) 50 MG tablet Take 50 mg by mouth daily.  09/30/18   [provider]  metFORMIN (GLUCOPHAGE) 1000 MG tablet Take 1,000  mg by mouth 2 (two) times daily.  12/10/14   [provider]  primidone (MYSOLINE) 50 MG tablet 50 mg at bedtime.  08/22/16   [provider]  PROAIR HFA 108 (90 BASE) MCG/ACT inhaler Inhale 2 Inhalers into the lungs every 6 (six) hours as needed for wheezing or shortness of breath.  12/03/14   [provider]  rosuvastatin (CRESTOR) 5 MG tablet Take 5 mg by mouth daily at 6 PM.  05/07/18 06/23/19  [provider]  sertraline (ZOLOFT) 100 MG tablet Take 100 mg by mouth daily.    [provider]  tiZANidine (ZANAFLEX) 2 MG tablet TAKE 1 TABLET BY MOUTH 3 TIMES DAILY AS NEEDED FOR MUSCLE SPASMS 12/11/17   [provider]  traMADol (ULTRAM) 50 MG tablet Take 1 tablet (50 mg total) by mouth every 6 (six) hours as needed. Patient not taking: Reported on 06/15/2019 03/25/18   Norval Gable, MD  valsartan (DIOVAN) 160 MG tablet Take 160 mg by mouth daily.  12/15/14   [provider]  vitamin B-12 (CYANOCOBALAMIN) 1000 MCG tablet Take 1,000 mcg by mouth daily.    [provider]  vitamin C (ASCORBIC ACID) 500 MG tablet Take 500 mg by mouth daily.    [provider]  Vitamin D, Ergocalciferol, (DRISDOL) 50000 UNITS CAPS  capsule Take 50,000 Units by mouth every 7 (seven) days. Tuesday 01/04/15   [provider]    Physical Exam: Vitals:   04/03/20 1350 04/03/20 1400 04/03/20 1430 04/03/20 1440  BP: (!) 178/76 (!) 175/73 (!) 162/141   Pulse: 100 (!) 103 100 96  Resp: (!) 23 19 (!) 25 (!) 21  Temp:      TempSrc:      SpO2: 90% (!) 88% 92% 94%  Weight:      Height:       General: Not in acute distress HEENT:       Eyes: PERRL, EOMI, no scleral icterus.       ENT: No discharge from the ears and nose, no pharynx injection, no tonsillar enlargement.        Neck: No JVD, no bruit, no mass felt. Heme: No neck lymph node enlargement. Cardiac: S1/S2, RRR, No murmurs, No gallops or rubs. Respiratory: Has crackles bilaterally and expiratory wheezing bilaterally GI: Soft, nondistended, nontender, no rebound pain, no organomegaly, BS present. GU: No hematuria Ext: has 2+ pitting leg edema bilaterally.  Patient has tenderness, erythema, warmth in both lower legs. 1+DP/PT pulse bilaterally. Musculoskeletal: No joint deformities, No joint redness or warmth, no limitation of ROM in spin. Skin: No rashes.  Neuro: Alert, oriented X3, cranial nerves II-XII grossly intact, moves all extremities normally.  Psych: Patient is not psychotic, no suicidal or hemocidal ideation.  Labs on Admission: I have personally reviewed following labs and imaging studies  CBC: Recent Labs  Lab 03/31/20 0900 04/03/20 1141  WBC 12.7* 10.7*  NEUTROABS  --  9.4*  HGB 16.9* 17.5*  HCT 52.6* 53.7*  MCV 88.9 89.6  PLT 246 681   Basic Metabolic Panel: Recent Labs  Lab 04/03/20 1141  NA 132*  K 4.5  CL 95*  CO2 28  GLUCOSE 156*  BUN 16  CREATININE 0.86  CALCIUM 8.1*   GFR: Estimated Creatinine Clearance: 55 mL/min (by C-G formula based on SCr of 0.86 mg/dL). Liver Function Tests: Recent Labs  Lab 04/03/20 1141  AST 19  ALT 11  ALKPHOS 89  BILITOT 0.4  PROT 6.7  ALBUMIN 2.7*  No results for input(s):  LIPASE, AMYLASE in the last 168 hours. No results for input(s): AMMONIA in the last 168 hours. Coagulation Profile: Recent Labs  Lab 03/31/20 0900  INR 1.0   Cardiac Enzymes: No results for input(s): CKTOTAL, CKMB, CKMBINDEX, TROPONINI in the last 168 hours. BNP (last 3 results) No results for input(s): PROBNP in the last 8760 hours. HbA1C: No results for input(s): HGBA1C in the last 72 hours. CBG: No results for input(s): GLUCAP in the last 168 hours. Lipid Profile: No results for input(s): CHOL, HDL, LDLCALC, TRIG, CHOLHDL, LDLDIRECT in the last 72 hours. Thyroid Function Tests: No results for input(s): TSH, T4TOTAL, FREET4, T3FREE, THYROIDAB in the last 72 hours. Anemia Panel: No results for input(s): VITAMINB12, FOLATE, FERRITIN, TIBC, IRON, RETICCTPCT in the last 72 hours. Urine analysis: No results found for: COLORURINE, APPEARANCEUR, LABSPEC, [place], GLUCOSEU, HGBUR, BILIRUBINUR, KETONESUR, PROTEINUR, UROBILINOGEN, NITRITE, LEUKOCYTESUR Sepsis Labs: _0 (procalcitonin:4,lacticidven:4) ) Recent Results (from the past 240 hour(s))  Resp Panel by RT-PCR (Flu A&B, Covid) Nasopharyngeal Swab     Status: None   Collection Time: 04/03/20 11:41 AM   Specimen: Nasopharyngeal Swab; Nasopharyngeal(NP) swabs in vial transport medium  Result Value Ref Range Status   SARS Coronavirus 2 by RT PCR NEGATIVE NEGATIVE Final    Comment: (NOTE) SARS-CoV-2 target nucleic acids are NOT DETECTED.  The SARS-CoV-2 RNA is generally detectable in upper respiratory specimens during the acute phase of infection. The lowest concentration of SARS-CoV-2 viral copies this assay can detect is 138 copies/mL. A negative result does not preclude SARS-Cov-2 infection and should not be used as the sole basis for treatment or other patient management decisions. A negative result may occur with  improper specimen collection/handling, submission of specimen other than nasopharyngeal swab, presence of  viral mutation(s) within the areas targeted by this assay, and inadequate number of viral copies(<138 copies/mL). A negative result must be combined with clinical observations, patient history, and epidemiological information. The expected result is Negative.  Fact Sheet for Patients:  EntrepreneurPulse.com.au  Fact Sheet for Healthcare Providers:  IncredibleEmployment.be  This test is no t yet approved or cleared by the [place] FDA and  has been authorized for detection and/or diagnosis of SARS-CoV-2 by FDA under an Emergency Use Authorization (EUA). This EUA will remain  in effect (meaning this test can be used) for the duration of the COVID-19 declaration under Section 564(b)(1) of the Act, 21 U.S.C.section 360bbb-3(b)(1), unless the authorization is terminated  or revoked sooner.       Influenza A by PCR NEGATIVE NEGATIVE Final   Influenza B by PCR NEGATIVE NEGATIVE Final    Comment: (NOTE) The Xpert Xpress SARS-CoV-2/FLU/RSV plus assay is intended as an aid in the diagnosis of influenza from Nasopharyngeal swab specimens and should not be used as a sole basis for treatment. Nasal washings and aspirates are unacceptable for Xpert Xpress SARS-CoV-2/FLU/RSV testing.  Fact Sheet for Patients: EntrepreneurPulse.com.au  Fact Sheet for Healthcare Providers: IncredibleEmployment.be  This test is not yet approved or cleared by the [place] FDA and has been authorized for detection and/or diagnosis of SARS-CoV-2 by FDA under an Emergency Use Authorization (EUA). This EUA will remain in effect (meaning this test can be used) for the duration of the COVID-19 declaration under Section 564(b)(1) of the Act, 21 U.S.C. section 360bbb-3(b)(1), unless the authorization is terminated or revoked.  Performed at [facility], [address], [place], [place] [address]      Radiological Exams on  Admission: DG  Chest 2 View  Result Date: 04/03/2020 CLINICAL DATA:  Hypoxia. Leg edema and shortness of breath. Pt daughter states that pt is supposed to be getting a renal bx but they are not able to do it because of the edema in pts legs. Pt states that she does feel short of breath. Pt does not wear oxygen at home. Pt placed on O2 in triage due to sats being in the 80s. Hx of asthma, chronic bronchitis, COPD, thyroid cancer- 2009, DM, current smoker. EXAM: CHEST - 2 VIEW COMPARISON:  01/04/2018 FINDINGS: Cardiac silhouette is borderline enlarged. Stable aortic atherosclerotic calcifications. No mediastinal or hilar masses. No evidence of adenopathy. Lungs demonstrate diffuse bilateral irregular interstitial thickening which has increased compared to the prior study. There is also linear/reticular scarring in the left lung base, which is stable. No lung consolidation. No pleural effusion and no pneumothorax. Skeletal structures are demineralized, but grossly intact. IMPRESSION: 1. Bilateral irregular interstitial thickening, increased compared to the prior study, along with borderline cardiomegaly. Suspect mild congestive heart failure. Consider diffuse interstitial infection or inflammation in the proper clinical setting. No evidence of lobar pneumonia. Electronically Signed   By: Lajean Manes M.D.   On: 04/03/2020 13:06     EKG: I have personally reviewed.  Sinus rhythm, QTC 454, LAE, anteroseptal infarction pattern, poor R wave progression, T wave inversion in lateral leads.  Assessment/Plan Principal Problem:   Acute CHF (congestive heart failure) (HCC) Active Problems:   COPD exacerbation (HCC)   Diabetes mellitus without complication ([place])   Current tobacco use   Hyperlipidemia   Essential hypertension   Hypothyroidism   Gout   Depression   Tremors of nervous system   Acute respiratory failure with hypoxia (HCC)   Lower extremity cellulitis   Acute respiratory failure with hypoxia  due to combination of acute CHF and COPD exacerbation:  Patient has bilateral leg edema, elevated BNP 1113, pulmonary edema on chest x-ray, clinically consistent with acute CHF.  Patient also has expiratory wheezing on auscultation, indicating COPD exacerbation.  -Admitted to progressive bed as inpatient -Continue BiPAP -Nasal cannula oxygen to maintain oxygen saturation above 93% when patient is off BiPAP -Bronchodilators -IV Lasix for CHF  Acute CHF (congestive heart failure) ([place]): 2D echo on 06/04/2017 showed EF> 55%, now patient has possible acute CHF, likely diastolic CHF.  Dr. Clayborn Bigness of cardiology is consulted. -Lasix 40 mg bid by IV (patient has received 80 mg of Lasix in ED) -2d echo -Daily weights -strict I/O's -Low salt diet -Fluid restriction -Obtain REDs Vest reading  COPD exacerbation (HCC) -Bronchodilators -Solu-Medrol 40 mg IV bid -pt is on Rocephin which is for leg cellulitis -Mucinex for cough  -Incentive spirometry -Follow up sputum culture -Nasal cannula oxygen as needed to maintain O2 saturation 93% or greater when she is off BiPAP  Diabetes mellitus without complication ([place]): No Z3A on record.  Patient is taking Metformin and glipizide at home.  Blood sugar 156 on BMAP -SSI  Current tobacco use: Patient is allergic to nicotine patch, hospital does not carry Chantix -Educated patient about importance of quitting smoking  Hyperlipidemia -Crestor  Essential hypertension -IV hydralazine as needed -[place] -pt is on IV lasix   Hypothyroidism -Synthroid  Gout -Colchicine prn  Depression -continue home meds  Tremors of nervous system -Primidone  Lower extremity cellulitis: - Empiric antimicrobial treatment with  Rocephin - PRN tylenol, morphine and Percocet for pain - Blood cultures x 2  - ESR and CRP - wound care consult -  LE doppler to r/o DVT           DVT ppx: SQ Lovenox Code Status: Full code Family Communication:  Yes,  patient's daughter at bed side Disposition Plan:  Anticipate discharge back to previous environment Consults called:  Dr. Clayborn Bigness of card Admission status:  progressive unit  as inpt        Status is: Inpatient  Remains inpatient appropriate because:Inpatient level of care appropriate due to severity of illness.  Patient has multiple comorbidities, now presents with acute respiratory failure due to possible acute CHF.  Patient has a new oxygen requirement.  Her presentation is highly complicated.  Patient is at high risk of deteriorating.  Will need to be treated in hospital for at least 2 days   Dispo: The patient is from: Home              Anticipated d/c is to: Home              Anticipated d/c date is: 2 days              Patient currently is not medically stable to d/c.          Date of Service 04/03/2020    Ivor Costa [facility]   If 7PM-7AM, please contact night-coverage [email or web address] 04/03/2020, 3:07 PM

## 2020-04-03 NOTE — ED Notes (Signed)
SPO2 now staying between 87-89% on 4L O2 per [place]. Attending provider made aware. SPO2 dipped down to 83% but recovered to 88% and now is between 88-89%.

## 2020-04-03 NOTE — ED Notes (Signed)
EDP at bedside. Daughter at bedside. Pt has COPD and is current smoker. Pt to ED complaining of increased bilateral leg edema. Feet and legs are visibly swollen. Lower legs are wrapped because per daughter legs are "weeping". EDP is cutting off bandages to examine legs.

## 2020-04-03 NOTE — ED Notes (Signed)
Called [facility] to have them bring nebulizer attachment for bipap machine.

## 2020-04-03 NOTE — ED Notes (Signed)
This nurse attempted 3 times to pull ordered meds from ED pyxis. Orders still not showing up in pyxis.

## 2020-04-03 NOTE — ED Triage Notes (Signed)
Pt to ED via POV for leg edema and shortness of breath. Pt daughter states that pt is supposed to be getting a renal bx but they are not able to do it because of the edema in pts legs. Pt has bilateral unna boots on. Pt states that she does feel short of breath. Pt does not wear oxygen at home. Pt placed on O2 in triage due to sats being in the 80s

## 2020-04-03 NOTE — ED Notes (Signed)
EDP still at bedside. Lower legs both ruddy color, with pitting edema +1 to knees. Feet are red. Thick toenails. Multiple areas on lower legs that are abraded and weeping.

## 2020-04-03 NOTE — ED Notes (Signed)
Bipap paused so pt can eat. Pt on 4L oxygen per [place].

## 2020-04-03 NOTE — ED Notes (Signed)
This nurse unable to obtain all ordered blood tests. Was able to insert one IV on L arm but IV does not draw back well. Second nurse called in to draw rest of blood.  Lab called and said they need redraw on purple tube.

## 2020-04-03 NOTE — ED Notes (Signed)
Lab called again. Both green and purple tubes were hemolyzed and need redraw. Green tube redrawn, purple already sent.

## 2020-04-03 NOTE — ED Provider Notes (Signed)
[facility] Emergency Department Provider Note  ____________________________________________   First MD Initiated Contact with Patient 04/03/20 1135     (approximate)  I have reviewed the triage vital signs and the nursing notes.   HISTORY  Chief Complaint Leg Swelling   HPI Martha [name] is a 74 y.o. female with a past medical history of COPD and ongoing tobacco abuse, asthma, DM, HTN, HDL, hypothyroidism, RLS, gout, GERD, and concern for possible nephrosis currently being worked up by nephrology on outpatient basis as well as chronic bilateral lower extremity swelling who presents for assessment of some swelling in her legs and worsening pain.  States she has chronic shortness of breath with any exertion as well as a chronic cough does not change at all lately.  She states she has chronic pain in both legs and was post come to the ER last week after they were unable to obtain a kidney biopsy on outpatient clinic but did not want to come at that time.  She denies any other acute symptoms including headache, earache, sore throat, chest pain abdominal pain, nausea, vomiting, diarrhea, dysuria, blood in her stool, blood in her urine or pain in her upper extremities or any change in the pain in her lower extremities.  She states that she gets her lower extremities wrapped once a week.  She denies any recent injuries or falls.  She is not on a blood thinner and has no known history of heart failure or other cardiac history.         Past Medical History:  Diagnosis Date  . Abnormal LFTs (liver function tests)   . Anxiety   . Arthritis    knees, feet  . Asthma   . Chronic bronchitis ([place])   . Chronic constipation   . Chronic low back pain   . Cigarette smoker    Has cut back Smoking to 1 pack every other day  . COPD (chronic obstructive pulmonary disease) ([place])   . Depression   . Diabetic neuropathy ([place])   . Diverticulitis 2015   gi recommended repeat  scope in 10 years  . Essential hypertension   . Fatty liver   . GERD (gastroesophageal reflux disease)   . Gout   . History of mammogram 05/28/2013  . Hyperlipidemia   . Hypothyroidism   . Insomnia   . Iron deficiency anemia   . Low serum vitamin D   . Personal history of tobacco use, presenting hazards to health 09/29/2015  . Plantar fasciitis   . RLS (restless legs syndrome)   . Shortness of breath dyspnea   . Thyroid cancer ([place]) 2009  . Tremors of nervous system    "I think I have parkinson's disease"  . Type 2 diabetes mellitus ([place])   . Wears dentures    uppers    Patient Active Problem List   Diagnosis Date Noted  . Acute CHF (congestive heart failure) ([place]) 04/03/2020  . Lower extremity cellulitis 04/03/2020  . Hyperlipidemia   . Essential hypertension   . Hypothyroidism   . Gout   . Depression   . Tremors of nervous system   . Acute respiratory failure with hypoxia ([place])   . Pain in limb 09/26/2018  . Swelling of limb 09/26/2018  . Personal history of tobacco use, presenting hazards to health 09/29/2015  . COPD exacerbation ([place]) 03/08/2015  . Diabetes mellitus without complication ([place]) 50/38/8828  . BP (high blood pressure) 03/08/2015  . Malignant neoplasm of thyroid  gland ([place]) 03/08/2015  . Has a tremor 03/08/2015  . Iron deficiency anemia due to chronic blood loss 03/08/2015  . Chronic anemia 08/24/2014  . Current tobacco use 04/27/2014    Past Surgical History:  Procedure Laterality Date  . CATARACT EXTRACTION W/PHACO Left 11/24/2015   Procedure: CATARACT EXTRACTION PHACO AND INTRAOCULAR LENS PLACEMENT (IOC);  Surgeon: Birder Robson, MD;  Location: [facility] ORS;  Service: Ophthalmology;  Laterality: Left;  [place] 38.3 AP% 20.6 CDE 7.89 FLUID PACK LOT # [id number]  . CATARACT EXTRACTION W/PHACO Right 06/23/2019   Procedure: CATARACT EXTRACTION PHACO AND INTRAOCULAR LENS PLACEMENT ([place]) RIGHT DIABETIC;  Surgeon: Birder Robson, MD;  Location: [facility];  Service: Ophthalmology;  Laterality: Right;  Diabetic  . COLONOSCOPY  2004, 2009, 2015   [place]  . POLYPECTOMY  2009  . THYROIDECTOMY  2009   states she was tx with radiactive iodine  . TUBAL LIGATION    . UPPER GASTROINTESTINAL ENDOSCOPY      Prior to Admission medications   Medication Sig Start Date End Date Taking? Authorizing Provider  alendronate (FOSAMAX) 70 MG tablet TAKE ONE TABLET BY MOUTH EACH WEEK, ON AN EMPTY STOMACH BEFORE BREAKFAST WITH 8oz OF WATER AND REMAIN UPRIGHT FOR :30 09/17/17   [provider]  aspirin EC 81 MG tablet Take 81 mg by mouth daily.     [provider]  cetirizine (ZYRTEC) 10 MG tablet Take 10 mg by mouth daily.     [provider]  colchicine 0.6 MG tablet 1 tab po bid prn 06/28/15   [provider]  Docusate Calcium (STOOL SOFTENER PO) Take 1 capsule by mouth as needed.     [provider]  ferrous sulfate 325 (65 FE) MG EC tablet Take 325 mg by mouth daily.  04/27/14 03/20/67  [provider]  fluticasone (FLONASE) 50 MCG/ACT nasal spray Place 2 sprays into both nostrils daily.    [provider]  gabapentin (NEURONTIN) 600 MG tablet Take 600 mg by mouth 3 (three) times daily.    [provider]  glipiZIDE (GLUCOTROL) 10 MG tablet Take 10 mg by mouth 2 (two) times daily.  12/10/14   [provider]  hydrochlorothiazide (HYDRODIURIL) 25 MG tablet Take 25 mg by mouth at bedtime.  02/25/15   [provider]  ketotifen (ZADITOR) 0.025 % ophthalmic solution Place 1 drop into both eyes as needed.    [provider]  levothyroxine (SYNTHROID, LEVOTHROID) 150 MCG tablet Take 150 mcg by mouth daily before breakfast.  02/25/15   [provider]  losartan (COZAAR) 50 MG tablet Take 50 mg by mouth daily.  09/30/18   [provider]  metFORMIN (GLUCOPHAGE) 1000 MG tablet Take 1,000 mg by mouth 2 (two) times daily.  12/10/14   [provider]   primidone (MYSOLINE) 50 MG tablet 50 mg at bedtime.  08/22/16   [provider]  PROAIR HFA 108 (90 BASE) MCG/ACT inhaler Inhale 2 Inhalers into the lungs every 6 (six) hours as needed for wheezing or shortness of breath.  12/03/14   [provider]  rosuvastatin (CRESTOR) 5 MG tablet Take 5 mg by mouth daily at 6 PM.  05/07/18 06/23/19  [provider]  sertraline (ZOLOFT) 100 MG tablet Take 100 mg by mouth daily.    [provider]  tiZANidine (ZANAFLEX) 2 MG tablet TAKE 1 TABLET BY MOUTH 3 TIMES DAILY AS NEEDED FOR MUSCLE SPASMS 12/11/17   [provider]  traMADol (  ULTRAM) 50 MG tablet Take 1 tablet (50 mg total) by mouth every 6 (six) hours as needed. Patient not taking: Reported on 06/15/2019 03/25/18   Norval Gable, MD  valsartan (DIOVAN) 160 MG tablet Take 160 mg by mouth daily.  12/15/14   [provider]  vitamin B-12 (CYANOCOBALAMIN) 1000 MCG tablet Take 1,000 mcg by mouth daily.    [provider]  vitamin C (ASCORBIC ACID) 500 MG tablet Take 500 mg by mouth daily.    [provider]  Vitamin D, Ergocalciferol, (DRISDOL) 50000 UNITS CAPS capsule Take 50,000 Units by mouth every 7 (seven) days. Tuesday 01/04/15   [provider]    Allergies Nicotine polacrilex, Nicotrol [nicotine], and Codeine sulfate  Family History  Problem Relation Age of Onset  . Diabetes Mellitus II Father   . Lung disease Father   . Diabetes Mellitus II Mother   . Arthritis Mother   . Thyroid disease Other   . Asthma Sister   . Emphysema Sister   . Hypertension Sister   . Breast cancer Paternal Aunt     Social History Social History   Tobacco Use  . Smoking status: Current Every Day Smoker    Packs/day: 1.00    Years: 39.50    Pack years: 39.50    Types: Cigarettes  . Smokeless tobacco: Never Used  . Tobacco comment: curently smokes 12 cigerattes a day. I'm trying to cut back.  Vaping Use  . Vaping Use: Never used   Substance Use Topics  . Alcohol use: No    Alcohol/week: 0.0 standard drinks  . Drug use: No    Review of Systems  Review of Systems  Constitutional: Positive for malaise/fatigue. Negative for chills and fever.  HENT: Negative for sore throat.   Eyes: Negative for pain.  Respiratory: Positive for cough ( chronic) and shortness of breath ( chronic w/ exertion ). Negative for stridor.   Cardiovascular: Negative for chest pain.  Gastrointestinal: Negative for vomiting.  Genitourinary: Negative for dysuria.  Musculoskeletal: Positive for myalgias ( b/l lower extremities).  Skin: Negative for rash.  Neurological: Negative for seizures, loss of consciousness and headaches.  Psychiatric/Behavioral: Negative for suicidal ideas.  All other systems reviewed and are negative.     ____________________________________________   PHYSICAL EXAM:  VITAL SIGNS: ED Triage Vitals  Enc Vitals Group     BP 04/03/20 1127 93/76     Pulse Rate 04/03/20 1127 100     Resp 04/03/20 1127 20     Temp 04/03/20 1127 98.8 F (37.1 C)     Temp Source 04/03/20 1127 Oral     SpO2 04/03/20 1127 (!) 83 %     Weight 04/03/20 1131 158 lb 11.7 oz (72 kg)     Height 04/03/20 1131 5\' 4"  (1.626 m)     Head Circumference --      Peak Flow --      Pain Score 04/03/20 1130 10     Pain Loc --      Pain Edu? --      Excl. in [place]? --    Vitals:   04/03/20 1430 04/03/20 1440  BP: (!) 162/141   Pulse: 100 96  Resp: (!) 25 (!) 21  Temp:    SpO2: 92% 94%   Physical Exam Vitals and nursing note reviewed.  Constitutional:      General: She is not in acute distress.    Appearance: She is well-developed.  HENT:  Head: Normocephalic and atraumatic.     Right Ear: External ear normal.     Left Ear: External ear normal.     Nose: Nose normal.  Eyes:     Conjunctiva/sclera: Conjunctivae normal.  Cardiovascular:     Rate and Rhythm: Normal rate and regular rhythm.     Heart sounds: No murmur heard.    Pulmonary:     Effort: Pulmonary effort is normal. No respiratory distress.     Breath sounds: Examination of the right-lower field reveals rales. Examination of the left-lower field reveals rales. Rales present.  Abdominal:     Palpations: Abdomen is soft.     Tenderness: There is no abdominal tenderness.  Musculoskeletal:     Cervical back: Neck supple.     Right lower leg: Edema present.     Left lower leg: Edema present.  Skin:    General: Skin is warm and dry.     Capillary Refill: Capillary refill takes less than 2 seconds.  Neurological:     Mental Status: She is alert and oriented to person, place, and time.  Psychiatric:        Mood and Affect: Mood normal.     2+ bilateral radial DP pulses.  Patient has some erythema circumferentially about her bilateral feet and lower legs.  She is able to flex and extend her feet bilaterally and sensation is intact light touch throughout both lower extremities. ____________________________________________   LABS (all labs ordered are listed, but only abnormal results are displayed)  Labs Reviewed  CBC WITH DIFFERENTIAL/PLATELET - Abnormal; Notable for the following components:      Result Value   WBC 10.7 (*)    RBC 5.99 (*)    Hemoglobin 17.5 (*)    HCT 53.7 (*)    RDW 20.1 (*)    Neutro Abs 9.4 (*)    Lymphs Abs 0.6 (*)    All other components within normal limits  BLOOD GAS, VENOUS - Abnormal; Notable for the following components:   pO2, Ven 51.0 (*)    Bicarbonate 31.9 (*)    Acid-Base Excess 6.2 (*)    All other components within normal limits  BRAIN NATRIURETIC PEPTIDE - Abnormal; Notable for the following components:   B Natriuretic Peptide 1,113.8 (*)    All other components within normal limits  COMPREHENSIVE METABOLIC PANEL - Abnormal; Notable for the following components:   Sodium 132 (*)    Chloride 95 (*)    Glucose, Bld 156 (*)    Calcium 8.1 (*)    Albumin 2.7 (*)    All other components within normal  limits  RESP PANEL BY RT-PCR (FLU A&B, COVID) ARPGX2  CULTURE, BLOOD (ROUTINE X 2)  CULTURE, BLOOD (ROUTINE X 2)  EXPECTORATED SPUTUM ASSESSMENT W REFEX TO RESP CULTURE  PROCALCITONIN  SEDIMENTATION RATE  URINALYSIS, COMPLETE (UACMP) WITH MICROSCOPIC  HEMOGLOBIN A1C  C-REACTIVE PROTEIN  TROPONIN I (HIGH SENSITIVITY)   ____________________________________________  EKG  Sinus rhythm with a ventricular of 96, normal axis, unremarkable intervals, nonspecific ST changes in lead I and lead II and a V6 without other clear evidence of acute ischemia or significant arrhythmia. ____________________________________________  RADIOLOGY  ED MD interpretation: Cardiomegaly with bilateral pulmonary edema.  No significant large effusion, pneumothorax, focal consolidation  Official radiology report(s): DG Chest 2 View  Result Date: 04/03/2020 CLINICAL DATA:  Hypoxia. Leg edema and shortness of breath. Pt daughter states that pt is supposed to be getting a renal bx but they are  not able to do it because of the edema in pts legs. Pt states that she does feel short of breath. Pt does not wear oxygen at home. Pt placed on O2 in triage due to sats being in the 80s. Hx of asthma, chronic bronchitis, COPD, thyroid cancer- 2009, DM, current smoker. EXAM: CHEST - 2 VIEW COMPARISON:  01/04/2018 FINDINGS: Cardiac silhouette is borderline enlarged. Stable aortic atherosclerotic calcifications. No mediastinal or hilar masses. No evidence of adenopathy. Lungs demonstrate diffuse bilateral irregular interstitial thickening which has increased compared to the prior study. There is also linear/reticular scarring in the left lung base, which is stable. No lung consolidation. No pleural effusion and no pneumothorax. Skeletal structures are demineralized, but grossly intact. IMPRESSION: 1. Bilateral irregular interstitial thickening, increased compared to the prior study, along with borderline cardiomegaly. Suspect mild  congestive heart failure. Consider diffuse interstitial infection or inflammation in the proper clinical setting. No evidence of lobar pneumonia. Electronically Signed   By: Lajean Manes M.D.   On: 04/03/2020 13:06    ____________________________________________   PROCEDURES  Procedure(s) performed (including Critical Care):  .Critical Care Performed by: Lucrezia Starch, MD Authorized by: Lucrezia Starch, MD   Critical care provider statement:    Critical care time (minutes):  45   Critical care time was exclusive of:  Separately billable procedures and treating other patients   Critical care was necessary to treat or prevent imminent or life-threatening deterioration of the following conditions:  Respiratory failure   Critical care was time spent personally by me on the following activities:  Discussions with consultants, evaluation of patient's response to treatment, examination of patient, ordering and performing treatments and interventions, ordering and review of laboratory studies, ordering and review of radiographic studies, pulse oximetry, re-evaluation of patient's condition, obtaining history from patient or surrogate and review of old charts     ____________________________________________   INITIAL IMPRESSION / [place] / ED COURSE      Patient presents for assessment of chronic bilateral lower extremity swelling and discomfort.  However on arrival she is noted to be in acute hypoxic respiratory failure with an SPO2 of 83% on room air with otherwise stable vital signs.  Is improved to mid 90s on 4 L.  Regarding patient's lower extremity edema and pain low suspicion for DVT or acute infectious process.  No recent trauma or injuries.  Certainly possible this is related to nephrosis and protein loss that she is currently being worked up for by nephrology but given hypoxic respiratory failure and findings of global volume overload and muscle concerned about acute  heart failure.  Chest x-ray does show evidence of edema and BNP is greater than 1000 currently supporting this.  There is no wheezing or evidence of hypercarbic respiratory failure to suggest acute COPD exacerbation.  No significant arrhythmia that would explain patient's hypoxia and while ECG does have some nonspecific findings of low suspicion for ACS given 2 nonelevated opponent's obtained over 2 hours.  CMP shows a glucose of 156 without evidence of acidosis and no other significant electrolyte or metabolic derangements.  CBC is grossly unremarkable.  Low suspicion for PE at this time given evidence on exam and imaging as well as elevated BNP suggestive of heart failure as etiology for hypoxic respiratory failure.  Patient given 80 mg of Lasix.  She will be admitted to medicine service for further evaluation management.    ____________________________________________   FINAL CLINICAL IMPRESSION(S) / ED DIAGNOSES  Final diagnoses:  Acute respiratory failure with hypoxia (HCC)  Acute pulmonary edema (HCC)  Elevated brain natriuretic peptide (BNP) level  Acute heart failure, unspecified heart failure type (HCC)    Medications  dextromethorphan-guaiFENesin (MUCINEX DM) 30-600 MG per 12 hr tablet 1 tablet (has no administration in time range)  ondansetron (ZOFRAN) injection 4 mg (has no administration in time range)  hydrALAZINE (APRESOLINE) injection 5 mg (has no administration in time range)  acetaminophen (TYLENOL) tablet 650 mg (has no administration in time range)  insulin aspart (novoLOG) injection 0-9 Units (has no administration in time range)  insulin aspart (novoLOG) injection 0-5 Units (has no administration in time range)  albuterol (PROVENTIL) (2.5 MG/3ML) 0.083% nebulizer solution 2.5 mg (has no administration in time range)  oxyCODONE-acetaminophen (PERCOCET/ROXICET) 5-325 MG per tablet 1 tablet (has no administration in time range)  cefTRIAXone (ROCEPHIN) 1 g in sodium  chloride 0.9 % 100 mL IVPB (1 g Intravenous New Bag/Given 04/03/20 1524)  furosemide (LASIX) injection 40 mg (has no administration in time range)  ipratropium-albuterol (DUONEB) 0.5-2.5 (3) MG/3ML nebulizer solution 3 mL (has no administration in time range)  methylPREDNISolone sodium succinate (SOLU-MEDROL) 40 mg/mL injection 40 mg (40 mg Intravenous Given 04/03/20 1522)  morphine 2 MG/ML injection 1 mg (1 mg Intravenous Given 04/03/20 1518)  furosemide (LASIX) injection 80 mg (80 mg Intravenous Given 04/03/20 1356)  acetaminophen (TYLENOL) tablet 1,000 mg (1,000 mg Oral Given 04/03/20 1354)     ED Discharge Orders    None       Note:  This document was prepared using Dragon voice recognition software and may include unintentional dictation errors.   Lucrezia Starch, MD 04/03/20 1539

## 2020-04-03 NOTE — ED Notes (Signed)
RT aware of order for bipap.

## 2020-04-03 NOTE — ED Notes (Signed)
Ultrasound at bedside

## 2020-04-03 NOTE — ED Notes (Signed)
Put pt back on Bipap. Explained to pt and daughter that pt needs to stay on Bipap because continues to have episodes of desaturation to low 80s while eatign and on nasal cannula. Called  RT to have them verify mask placement and bipap settings. Pt currently 91% with Bipap mask on face.

## 2020-04-03 NOTE — ED Notes (Signed)
RT in room applying bipap.

## 2020-04-03 NOTE — ED Notes (Signed)
Pt declined IV placement for lab collection. Explained that there may be more labs ordered as time goes on.

## 2020-04-03 NOTE — ED Notes (Signed)
Pt SPO2 is 90-92%. Moved from 4L to 5L per [place]. Attending made aware.

## 2020-04-03 NOTE — ED Notes (Signed)
Pt eating meal tray. Daughter at bedside.

## 2020-04-03 NOTE — ED Notes (Signed)
1st set of cultures drawn. Unable to collect 2nd set of cultures at this time. Per attending, abx are not to be delayed.

## 2020-04-04 ENCOUNTER — Inpatient Hospital Stay
Admit: 2020-04-04 | Discharge: 2020-04-04 | Disposition: A | Discharge status: Home / Self Care | Payer: Medicare Other | Attending: Internal Medicine | Admitting: Internal Medicine

## 2020-04-04 DIAGNOSIS — R7989 Other specified abnormal findings of blood chemistry: Secondary | ICD-10-CM

## 2020-04-04 DIAGNOSIS — J810 Acute pulmonary edema: Secondary | ICD-10-CM

## 2020-04-04 DIAGNOSIS — L03119 Cellulitis of unspecified part of limb: Secondary | ICD-10-CM

## 2020-04-04 DIAGNOSIS — I5031 Acute diastolic (congestive) heart failure: Secondary | ICD-10-CM

## 2020-04-04 LAB — CBC
HCT: 51.7 % — ABNORMAL HIGH (ref 36.0–46.0)
Hemoglobin: 16.4 g/dL — ABNORMAL HIGH (ref 12.0–15.0)
MCH: 28.2 pg (ref 26.0–34.0)
MCHC: 31.7 g/dL (ref 30.0–36.0)
MCV: 88.8 fL (ref 80.0–100.0)
Platelets: 236 10*3/uL (ref 150–400)
RBC: 5.82 MIL/uL — ABNORMAL HIGH (ref 3.87–5.11)
RDW: 19.8 % — ABNORMAL HIGH (ref 11.5–15.5)
WBC: 13.8 10*3/uL — ABNORMAL HIGH (ref 4.0–10.5)
nRBC: 0 % (ref 0.0–0.2)

## 2020-04-04 LAB — BASIC METABOLIC PANEL
Anion gap: 11 (ref 5–15)
BUN: 16 mg/dL (ref 8–23)
CO2: 29 mmol/L (ref 22–32)
Calcium: 8.1 mg/dL — ABNORMAL LOW (ref 8.9–10.3)
Chloride: 94 mmol/L — ABNORMAL LOW (ref 98–111)
Creatinine, Ser: 0.7 mg/dL (ref 0.44–1.00)
GFR, Estimated: >60 mL/min (ref >60)
Glucose, Bld: 159 mg/dL — ABNORMAL HIGH (ref 70–99)
Potassium: 4.4 mmol/L (ref 3.5–5.1)
Sodium: 134 mmol/L — ABNORMAL LOW (ref 135–145)

## 2020-04-04 LAB — C-REACTIVE PROTEIN: CRP: 4.7 mg/dL — ABNORMAL HIGH (ref <1.0)

## 2020-04-04 LAB — ECHOCARDIOGRAM COMPLETE
AR max vel: 2.51 cm2
AV Area VTI: 2.46 cm2
AV Area mean vel: 2.43 cm2
AV Mean grad: 3 mmHg
AV Peak grad: 6.8 mmHg
Ao pk vel: 1.3 m/s
Area-P 1/2: 3 cm2
Calc EF: 54.5 %
Height: 64 in
S' Lateral: 3.64 cm
Single Plane A2C EF: 38.2 %
Single Plane A4C EF: 63.3 %
Weight: 2908.8 oz

## 2020-04-04 LAB — GLUCOSE, CAPILLARY
Glucose-Capillary: 172 mg/dL — ABNORMAL HIGH (ref 70–99)
Glucose-Capillary: 199 mg/dL — ABNORMAL HIGH (ref 70–99)
Glucose-Capillary: 123 mg/dL — ABNORMAL HIGH (ref 70–99)
Glucose-Capillary: 263 mg/dL — ABNORMAL HIGH (ref 70–99)

## 2020-04-04 LAB — PROTIME-INR
INR: 1 (ref 0.8–1.2)
Prothrombin Time: 12.6 seconds (ref 11.4–15.2)

## 2020-04-04 LAB — PROCALCITONIN: Procalcitonin: <0.1 ng/mL

## 2020-04-04 LAB — MAGNESIUM: Magnesium: 1.7 mg/dL (ref 1.7–2.4)

## 2020-04-04 MED ORDER — SODIUM CHLORIDE 0.9 % IV SOLN
2.0000 g | INTRAVENOUS | Status: DC
  Administered 2020-04-05 – 2020-04-06 (×2): 2 g via INTRAVENOUS
  Filled 2020-04-04 (×2): qty 20

## 2020-04-04 MED ORDER — FLUTICASONE PROPIONATE 50 MCG/ACT NA SUSP
1.0000 | Freq: Every day | NASAL | Status: DC | PRN
  Filled 2020-04-04: qty 16

## 2020-04-04 MED ORDER — SODIUM CHLORIDE 0.9 % IV SOLN
250.0000 mL | INTRAVENOUS | Status: DC | PRN
  Administered 2020-04-09 – 2020-04-15 (×11): 250 mL via INTRAVENOUS

## 2020-04-04 MED ORDER — FUROSEMIDE 10 MG/ML IJ SOLN
6.0000 mg/h | INTRAVENOUS | Status: DC
  Administered 2020-04-04 – 2020-04-08 (×3): 6 mg/h via INTRAVENOUS
  Filled 2020-04-04 (×6): qty 20

## 2020-04-04 MED ORDER — ASCORBIC ACID 500 MG PO TABS
1000.0000 mg | ORAL_TABLET | Freq: Every day | ORAL | Status: DC
  Administered 2020-04-04 – 2020-04-20 (×13): 1000 mg via ORAL
  Filled 2020-04-04 (×14): qty 2

## 2020-04-04 MED ORDER — LEVOTHYROXINE SODIUM 50 MCG PO TABS
150.0000 ug | ORAL_TABLET | Freq: Every day | ORAL | Status: DC
  Administered 2020-04-04 – 2020-04-20 (×9): 150 ug via ORAL
  Filled 2020-04-04 (×11): qty 1

## 2020-04-04 MED ORDER — MAGNESIUM SULFATE 2 GM/50ML IV SOLN
2.0000 g | Freq: Once | INTRAVENOUS | Status: AC
  Administered 2020-04-04: 2 g via INTRAVENOUS
  Filled 2020-04-04: qty 50

## 2020-04-04 MED ORDER — GABAPENTIN 600 MG PO TABS
600.0000 mg | ORAL_TABLET | Freq: Three times a day (TID) | ORAL | Status: DC
  Administered 2020-04-04 – 2020-04-07 (×10): 600 mg via ORAL
  Filled 2020-04-04 (×11): qty 1

## 2020-04-04 MED ORDER — VITAMIN B-12 1000 MCG PO TABS
1000.0000 ug | ORAL_TABLET | Freq: Every day | ORAL | Status: DC
  Administered 2020-04-04 – 2020-04-20 (×13): 1000 ug via ORAL
  Filled 2020-04-04 (×14): qty 1

## 2020-04-04 MED ORDER — NICOTINE 21 MG/24HR TD PT24
21.0000 mg | MEDICATED_PATCH | Freq: Every day | TRANSDERMAL | Status: DC
  Administered 2020-04-04 – 2020-04-11 (×8): 21 mg via TRANSDERMAL
  Filled 2020-04-04 (×8): qty 1

## 2020-04-04 MED ORDER — SODIUM CHLORIDE 0.9% FLUSH
3.0000 mL | Freq: Two times a day (BID) | INTRAVENOUS | Status: DC
  Administered 2020-04-04 – 2020-04-21 (×32): 3 mL via INTRAVENOUS

## 2020-04-04 MED ORDER — TIZANIDINE HCL 2 MG PO TABS
2.0000 mg | ORAL_TABLET | Freq: Three times a day (TID) | ORAL | Status: DC | PRN
  Filled 2020-04-04: qty 1

## 2020-04-04 MED ORDER — SODIUM CHLORIDE 0.9% FLUSH: 3.0000 mL | INTRAVENOUS | Status: DC | PRN

## 2020-04-04 MED ORDER — ROPINIROLE HCL 1 MG PO TABS
0.5000 mg | ORAL_TABLET | Freq: Every day | ORAL | Status: DC
  Administered 2020-04-04 – 2020-04-07 (×4): 0.5 mg via ORAL
  Filled 2020-04-04 (×5): qty 1

## 2020-04-04 MED ORDER — SERTRALINE HCL 50 MG PO TABS
50.0000 mg | ORAL_TABLET | Freq: Every day | ORAL | Status: DC
  Administered 2020-04-04 – 2020-04-08 (×5): 50 mg via ORAL
  Filled 2020-04-04 (×5): qty 1

## 2020-04-04 MED ORDER — ROSUVASTATIN CALCIUM 5 MG PO TABS
5.0000 mg | ORAL_TABLET | Freq: Every day | ORAL | Status: DC
  Administered 2020-04-04 – 2020-04-19 (×13): 5 mg via ORAL
  Filled 2020-04-04 (×15): qty 1

## 2020-04-04 MED ORDER — ENOXAPARIN SODIUM 40 MG/0.4ML ~~LOC~~ SOLN
40.0000 mg | SUBCUTANEOUS | Status: DC
  Administered 2020-04-04 – 2020-04-06 (×3): 40 mg via SUBCUTANEOUS
  Filled 2020-04-04 (×4): qty 0.4

## 2020-04-04 MED ORDER — NICOTINE 21 MG/24HR TD PT24: 21.0000 mg | MEDICATED_PATCH | Freq: Every day | TRANSDERMAL | Status: DC

## 2020-04-04 MED ORDER — ASPIRIN EC 81 MG PO TBEC
81.0000 mg | DELAYED_RELEASE_TABLET | Freq: Every day | ORAL | Status: DC
  Administered 2020-04-04 – 2020-04-07 (×4): 81 mg via ORAL
  Filled 2020-04-04 (×4): qty 1

## 2020-04-04 MED ORDER — LORATADINE 10 MG PO TABS
10.0000 mg | ORAL_TABLET | Freq: Every day | ORAL | Status: DC
  Administered 2020-04-04 – 2020-04-07 (×4): 10 mg via ORAL
  Filled 2020-04-04 (×5): qty 1

## 2020-04-04 MED ORDER — PERFLUTREN LIPID MICROSPHERE
1.0000 mL | INTRAVENOUS | Status: AC | PRN
  Administered 2020-04-04: 2 mL via INTRAVENOUS
  Filled 2020-04-04: qty 10

## 2020-04-04 MED ORDER — SODIUM CHLORIDE 0.9 % IV SOLN
2.0000 g | INTRAVENOUS | Status: DC
  Administered 2020-04-04: 2 g via INTRAVENOUS
  Filled 2020-04-04: qty 20
  Filled 2020-04-04: qty 2

## 2020-04-04 MED ORDER — ALBUMIN HUMAN 25 % IV SOLN
25.0000 g | Freq: Three times a day (TID) | INTRAVENOUS | Status: DC
  Administered 2020-04-04 – 2020-04-07 (×11): 25 g via INTRAVENOUS
  Filled 2020-04-04 (×13): qty 100

## 2020-04-04 MED ORDER — PRIMIDONE 50 MG PO TABS
50.0000 mg | ORAL_TABLET | Freq: Two times a day (BID) | ORAL | Status: DC
  Administered 2020-04-04 – 2020-04-07 (×8): 50 mg via ORAL
  Filled 2020-04-04 (×14): qty 1

## 2020-04-04 MED ORDER — LOSARTAN POTASSIUM 50 MG PO TABS
50.0000 mg | ORAL_TABLET | Freq: Every day | ORAL | Status: DC
  Administered 2020-04-04 – 2020-04-06 (×3): 50 mg via ORAL
  Filled 2020-04-04 (×4): qty 1

## 2020-04-04 MED ORDER — ACETAMINOPHEN 325 MG PO TABS
650.0000 mg | ORAL_TABLET | Freq: Four times a day (QID) | ORAL | Status: DC | PRN
  Administered 2020-04-05 – 2020-04-19 (×6): 650 mg via ORAL
  Filled 2020-04-04 (×6): qty 2

## 2020-04-04 MED ORDER — COLCHICINE 0.6 MG PO TABS: 0.6000 mg | ORAL_TABLET | Freq: Two times a day (BID) | ORAL | Status: DC | PRN

## 2020-04-04 NOTE — Progress Notes (Signed)
I have attempted to put patient on bipap however she c/o intense leg pain that is keeping her from resting. She is alert and wishes to watch tv. She is talking well and states her breathing is fine. Remains on o2. Has taken tx and used IS without difficulty. Will continue to monitor

## 2020-04-04 NOTE — Consult Note (Addendum)
WOC Nurse Consult Note: Pt with chronic full thickness stasis ulcers to BLE; family member states she was wearing Una boots prior to admission. Pt just completed standing with therapy and was noted to have a large amt yellow drainage weeping when legs are dependent.  Currently, there is a small amt drainage after being placed back in bed.  Reason for Consult: Left posterior leg with full thickness wound where previous blister has ruptured; 4X4X.1cm, white and moist.  Patchy scattered areas of red moist full thickness wounds; all are .8X.8X.1cm or smaller in size. Generalized edema and erythremia. Small amt yellow drainage, painful to touch.  Right leg with patchy scattered areas of yellow moist full thickness wounds; all are .8X.8X.1cm or smaller in size. Generalized edema and erythremia. Small amt yellow drainage, painful to touch.  Applied [employer] and coban to BLE.  Dressing procedure/placement/frequency: Topical treatment orders provided for bedside nurses as follows: Leave Una boots on to BLE; [place] nurses wil change Q Mon/Thurs while in the hospital. Pt will need to resume home health for dressing changes after discharge. Julien Girt MSN, RN, [place], [place], [place]

## 2020-04-04 NOTE — Evaluation (Signed)
Physical Therapy Evaluation Patient Details Name: Martha [name] MRN: [id number] DOB: January 19, 1946 Today's Date: 04/04/2020   History of Present Illness  HPI: Martha [name] is a 74 y.o. female with medical history significant of  HTN, HLD, DM, COPD, tobacco abuse, hypothyroidism, gout, depression, tremor, proteinuria, depression, anxiety, GERD, diverticulitis, who presents with bilateral leg edema and pain, and SOB.  Pt with very painful b/l LE wounds (R>L) that limit abiltiy to participate with PT.    Clinical Impression  Pt with a whole lot of pain in LEs 2/2 wounds/swelling.  She initially did not feel like she could do a whole lot of activity but did agree to do some light in-bed activity, mobility.  Ultimately she had pain with all tasks and was hyper sensitive with any palpation of LEs, she did ultimately manage to stand but with very poor tolerance (<10 seconds).  Pt with significant limitations and will need STR before she is able to safely return home.     Follow Up Recommendations SNF    Equipment Recommendations  None recommended by PT    Recommendations for Other Services       Precautions / Restrictions Precautions Precautions: Fall Restrictions Weight Bearing Restrictions: No Other Position/Activity Restrictions: b/l LEs very swollen, painful, open wounds      Mobility  Bed Mobility Overal bed mobility: Needs Assistance Bed Mobility: Supine to Sit;Sit to Supine     Supine to sit: Mod assist Sit to supine: Max assist   General bed mobility comments: Pt showed good effort getting to EOB but ultimately needed considerable assist, encouragement and cuing to do so    Transfers Overall transfer level: Needs assistance Equipment used: Rolling walker (2 wheeled) Transfers: Sit to/from Stand Sit to Stand: Mod assist         General transfer comment: Pt showed relatively good effort, but was very pain limited with most of the effort and showed poor  tolerance.  Ambulation/Gait             General Gait Details: not appropriate/able to ambulate 2/2 pain  Stairs            Wheelchair Mobility    Modified Rankin (Stroke Patients Only)       Balance Overall balance assessment: Needs assistance   Sitting balance-Leahy Scale: Fair Sitting balance - Comments: leaning forward, no overt LOBs but needing constant assist too keep from falling forward     Standing balance-Leahy Scale: Fair Standing balance comment: reliant on walker, poor tolerance 2/2 pain                             Pertinent Vitals/Pain Pain Assessment: 0-10 Pain Score: 8  Pain Location: b/l LEs, pain increases with any/all movement    Home Living Family/patient expects to be discharged to:: Skilled nursing facility Living Arrangements: Spouse/significant other   Type of Home: House         Home Equipment: Walker - 4 wheels;Bedside commode;Cane - single point      Prior Function Level of Independence: Independent with assistive device(s)         Comments: Pt has been less and less able to get around in the home recently, wound care/una boots for the last few months      Hand Dominance        Extremity/Trunk Assessment   Upper Extremity Assessment Upper Extremity Assessment: Generalized weakness    Lower Extremity Assessment Lower  Extremity Assessment: Generalized weakness (very pain sensitive/limited )       Communication   Communication: No difficulties  Cognition Arousal/Alertness: Awake/alert Behavior During Therapy: Anxious Overall Cognitive Status: Within Functional Limits for tasks assessed                                        General Comments      Exercises General Exercises - Lower Extremity Ankle Circles/Pumps: AROM;5 reps Hip ABduction/ADduction: AAROM;5 reps (very pain limited )   Assessment/Plan    PT Assessment Patient needs continued PT services  PT Problem List  Decreased activity tolerance;Decreased safety awareness;Decreased strength;Decreased range of motion;Decreased balance;Decreased mobility;Pain;Decreased knowledge of use of DME;Decreased skin integrity       PT Treatment Interventions DME instruction;Gait training;Functional mobility training;Therapeutic activities;Therapeutic exercise;Balance training;Neuromuscular re-education;Patient/family education    PT Goals (Current goals can be found in the Care Plan section)  Acute Rehab PT Goals Patient Stated Goal: Get these wounds under control PT Goal Formulation: With patient Time For Goal Achievement: 04/18/20 Potential to Achieve Goals: Fair    Frequency Min 2X/week   Barriers to discharge        Co-evaluation               AM-PAC PT "6 Clicks" Mobility  Outcome Measure Help needed turning from your back to your side while in a flat bed without using bedrails?: A Little Help needed moving from lying on your back to sitting on the side of a flat bed without using bedrails?: A Lot Help needed moving to and from a bed to a chair (including a wheelchair)?: A Lot Help needed standing up from a chair using your arms (e.g., wheelchair or bedside chair)?: Total Help needed to walk in hospital room?: Total Help needed climbing 3-5 steps with a railing? : Total 6 Click Score: 10    End of Session Equipment Utilized During Treatment: Gait belt Activity Tolerance: Patient tolerated treatment well Patient left: with call bell/phone within reach;with family/visitor present;with nursing/sitter in room Nurse Communication: Mobility status PT Visit Diagnosis: Muscle weakness (generalized) (M62.81);Difficulty in walking, not elsewhere classified (R26.2)    Time: 2060-1561 PT Time Calculation (min) (ACUTE ONLY): 33 min   Charges:   PT Evaluation $PT Eval Low Complexity: 1 Low PT Treatments $Therapeutic Activity: 8-22 mins        Kreg Shropshire, DPT 04/04/2020, 11:42 AM

## 2020-04-04 NOTE — NC FL2 (Signed)
[place] LEVEL OF CARE SCREENING TOOL     IDENTIFICATION  Patient Name: Martha [name] Birthdate: [birth year] Sex: female Admission Date (Current Location): 04/03/2020  [facility] and [place] Number:  Engineering geologist and Address:         Provider Number: [id number]  Attending Physician Name and Address:  Lorella Nimrod, MD  Relative Name and Phone Number:       Current Level of Care: Hospital Recommended Level of Care: [place] Prior Approval Number:    Date Approved/Denied:   PASRR Number: [id number]  Discharge Plan: SNF    Current Diagnoses: Patient Active Problem List   Diagnosis Date Noted  . Acute pulmonary edema (HCC)   . Elevated brain natriuretic peptide (BNP) level   . Acute CHF (congestive heart failure) ([place]) 04/03/2020  . Cellulitis of lower extremity 04/03/2020  . Hyperlipidemia   . Essential hypertension   . Hypothyroidism   . Gout   . Depression   . Tremors of nervous system   . Acute respiratory failure with hypoxia ([place])   . Pain in limb 09/26/2018  . Swelling of limb 09/26/2018  . Personal history of tobacco use, presenting hazards to health 09/29/2015  . COPD exacerbation ([place]) 03/08/2015  . Diabetes mellitus without complication ([place]) 38/25/0539  . BP (high blood pressure) 03/08/2015  . Malignant neoplasm of thyroid gland ([place]) 03/08/2015  . Has a tremor 03/08/2015  . Iron deficiency anemia due to chronic blood loss 03/08/2015  . Chronic anemia 08/24/2014  . Current tobacco use 04/27/2014    Orientation RESPIRATION BLADDER Height & Weight     Self, Time, Situation, Place  O2 (4L ) Incontinent, External catheter Weight: 82.5 kg Height:  5\' 4"  (162.6 cm)  BEHAVIORAL SYMPTOMS/MOOD NEUROLOGICAL BOWEL NUTRITION STATUS      Incontinent Diet (Heart Healthy Carb modified)  AMBULATORY STATUS COMMUNICATION OF NEEDS Skin   Extensive Assist Verbally Other (Comment) (full thickness stasis ulcers to BLE)                        Personal Care Assistance Level of Assistance              Functional Limitations Info             SPECIAL CARE FACTORS FREQUENCY  PT (By licensed PT), OT (By licensed OT)                    Contractures Contractures Info: Not present    Additional Factors Info  Code Status, Allergies Code Status Info: Full Allergies Info: Nicotine Polacrilex, Nicotrol, Codeine Sulfate           Current Medications (04/04/2020):  This is the current hospital active medication list Current Facility-Administered Medications  Medication Dose Route Frequency Provider Last Rate Last Admin  . 0.9 %  sodium chloride infusion  250 mL Intravenous PRN Ivor Costa, MD      . acetaminophen (TYLENOL) tablet 650 mg  650 mg Oral Q6H PRN Lorella Nimrod, MD      . albumin human 25 % solution 25 g  25 g Intravenous Q8H Lateef, Munsoor, MD 60 mL/hr at 04/04/20 1256 25 g at 04/04/20 1256  . albuterol (PROVENTIL) (2.5 MG/3ML) 0.083% nebulizer solution 2.5 mg  2.5 mg Nebulization Q4H PRN Ivor Costa, MD      . ascorbic acid (VITAMIN C) tablet 1,000 mg  1,000 mg Oral Daily Ivor Costa, MD   1,000  mg at 04/04/20 0826  . aspirin EC tablet 81 mg  81 mg Oral Daily Ivor Costa, MD   81 mg at 04/04/20 0825  . [START ON 04/05/2020] cefTRIAXone (ROCEPHIN) 2 g in sodium chloride 0.9 % 100 mL IVPB  2 g Intravenous Q24H Amin, Soundra Pilon, MD      . colchicine tablet 0.6 mg  0.6 mg Oral BID PRN Ivor Costa, MD      . dextromethorphan-guaiFENesin [facility] DM) 30-600 MG per 12 hr tablet 1 tablet  1 tablet Oral BID PRN Ivor Costa, MD      . enoxaparin (LOVENOX) injection 40 mg  40 mg Subcutaneous Q24H Ivor Costa, MD   40 mg at 04/04/20 0827  . fentaNYL (SUBLIMAZE) injection 12.5 mcg  12.5 mcg Intravenous Q2H PRN Sharion Settler, NP   12.5 mcg at 04/04/20 1505  . fluticasone (FLONASE) 50 MCG/ACT nasal spray 1-2 spray  1-2 spray Each Nare Daily PRN Ivor Costa, MD      . furosemide (LASIX) 200 mg in dextrose 5  % 100 mL (2 mg/mL) infusion  6 mg/hr Intravenous Continuous Lateef, Munsoor, MD 3 mL/hr at 04/04/20 1429 6 mg/hr at 04/04/20 1429  . gabapentin (NEURONTIN) tablet 600 mg  600 mg Oral TID Ivor Costa, MD   600 mg at 04/04/20 1505  . hydrALAZINE (APRESOLINE) injection 5 mg  5 mg Intravenous Q2H PRN Ivor Costa, MD   5 mg at 04/03/20 1935  . insulin aspart (novoLOG) injection 0-5 Units  0-5 Units Subcutaneous QHS Ivor Costa, MD      . insulin aspart (novoLOG) injection 0-9 Units  0-9 Units Subcutaneous TID WC Ivor Costa, MD   2 Units at 04/04/20 1225  . ipratropium-albuterol (DUONEB) 0.5-2.5 (3) MG/3ML nebulizer solution 3 mL  3 mL Nebulization Q4H Ivor Costa, MD   3 mL at 04/04/20 1555  . levothyroxine (SYNTHROID) tablet 150 mcg  150 mcg Oral Q0600 Ivor Costa, MD   150 mcg at 04/04/20 0645  . loratadine (CLARITIN) tablet 10 mg  10 mg Oral Daily Ivor Costa, MD   10 mg at 04/04/20 0825  . losartan (COZAAR) tablet 50 mg  50 mg Oral Daily Ivor Costa, MD   50 mg at 04/04/20 0826  . magnesium sulfate IVPB 2 g 50 mL  2 g Intravenous Once Lorella Nimrod, MD      . methylPREDNISolone sodium succinate (SOLU-MEDROL) 40 mg/mL injection 40 mg  40 mg Intravenous Q12H Ivor Costa, MD   40 mg at 04/04/20 1505  . nicotine (NICODERM CQ - dosed in mg/24 hours) patch 21 mg  21 mg Transdermal Daily Lateef, Munsoor, MD   21 mg at 04/04/20 1228  . ondansetron (ZOFRAN) injection 4 mg  4 mg Intravenous Q8H PRN Ivor Costa, MD      . oxyCODONE-acetaminophen (PERCOCET/ROXICET) 5-325 MG per tablet 1 tablet  1 tablet Oral Q6H PRN Ivor Costa, MD   1 tablet at 04/03/20 2310  . primidone (MYSOLINE) tablet 50 mg  50 mg Oral BID Ivor Costa, MD   50 mg at 04/04/20 0827  . rOPINIRole (REQUIP) tablet 0.5 mg  0.5 mg Oral QHS Ivor Costa, MD      . rosuvastatin (CRESTOR) tablet 5 mg  5 mg Oral Daily Ivor Costa, MD   5 mg at 04/04/20 0825  . sertraline (ZOLOFT) tablet 50 mg  50 mg Oral Daily Ivor Costa, MD   50 mg at 04/04/20 0826  . sodium  chloride flush (NS) 0.9 % injection  3 mL  3 mL Intravenous Q12H Ivor Costa, MD   3 mL at 04/04/20 0828  . sodium chloride flush (NS) 0.9 % injection 3 mL  3 mL Intravenous PRN Ivor Costa, MD      . tiZANidine (ZANAFLEX) tablet 2 mg  2 mg Oral TID PRN Ivor Costa, MD      . vitamin B-12 (CYANOCOBALAMIN) tablet 1,000 mcg  1,000 mcg Oral Daily Ivor Costa, MD   1,000 mcg at 04/04/20 1950     Discharge Medications: Please see discharge summary for a list of discharge medications.  Relevant Imaging Results:  Relevant Lab Results:   Additional Information ss [id number]  Beverly Sessions, RN

## 2020-04-04 NOTE — Progress Notes (Signed)
OT Cancellation Note  Patient Details Name: Martha [name] MRN: [id number] DOB: 05-04-1945   Cancelled Treatment:    Reason Eval/Treat Not Completed: Other (comment). Consult received, chart reviewed. On 1st attempt, pt with wound care. 2nd attempt pt getting an echocardiogram. 3rd attempt, MD in room, staff waiting for blood draw. Will re-attempt next date as pt is appropriate and available.   Jeni Salles, MPH, MS, OTR/L ascom [phone] 04/04/20, 11:47 AM

## 2020-04-04 NOTE — Progress Notes (Signed)
*  PRELIMINARY RESULTS* Echocardiogram 2D Echocardiogram has been performed.  Martha [name] 04/04/2020, 11:50 AM

## 2020-04-04 NOTE — Progress Notes (Signed)
OT Cancellation Note  Patient Details Name: Martha [name] MRN: [id number] DOB: [birth year]   Cancelled Treatment:    Reason Eval/Treat Not Completed: Other (comment). Pt with nurse tech for pt care. Will re-attempt next date.  Jeni Salles, MPH, MS, OTR/L ascom [phone] 04/04/20, 3:03 PM

## 2020-04-04 NOTE — Consult Note (Addendum)
   Heart Failure Nurse Navigator Note  HF-echocardiogram is pending on this admission.  Echo in 2019 ejection fraction was greater than 55%.  She presented to the emergency room with complaints of bilateral increasing leg edema, pain and shortness of breath.  Chest x-ray ray revealed mild pulmonary edema and cardiomegaly.  Co morbidities:  Hyperlipidemia Hypertension Gout Depression COPD Diabetes Iron deficiency anemia  She continues to smoke 2 to 3 packs of cigarettes daily.   Medications:  Aspirin 81 mg daily Lasix 6 mg an hour IV Cozaar 50 mg daily  Labs: Sodium 134, potassium 4.4, chloride 94, CO2 29, BUN 16, creatinine 0.7, magnesium 1.7, BNP 1113, hemoglobin 16.4, hematocrit 51.7.  Intake 460 mL Output 1750 mL Weight is 82.5 kg BMI 31.2 Blood pressure 171/78  Assessment:  General she is lying in bed in no acute distress, currently on a cannula at 6 L.  HEENT-edentulous, no JVD, pupils are equal  Cardiac-heart tones of regular rate and rhythm no murmurs or gallops appreciated.  Chest-coarse breath sounds with faint expiratory wheezes throughout.  Abdomen-rounded and soft non tender  Musculoskeletal-lower legs are wrapped in [employer].  Psych-she is pleasant, makes eye contact.  Neurologic-speech is clear she moves all extremities without difficulty   Initial visit with patient, she states that she lives with a friend and he does most of the cooking, she does not add salt at the table and they try to eat fresh vegetables and fruits.  She states rarely she will go to [employer] and get a fish sandwich.  At this time she does not weigh herself daily but does have a scale, stressed the importance of weighing daily and reporting 2 to 3 pound weight gain overnight or 5 pounds within a week.  Went through the heart failure teaching booklet along with zone magnet.  During the conversation she frequently mentioned that she feels good enough to go home and  thinks that she would be better off at home.  She states that she does not have her glasses at this time, asked if her daughter could bring those in so she could see.  Continue to follow along.  Pricilla Riffle RN [facility]

## 2020-04-04 NOTE — Progress Notes (Signed)
PROGRESS NOTE    [name]  VHQ:[id number] DOB: 04-May-1945 DOA: 04/03/2020 PCP: Tracie Harrier, MD   Brief Narrative: Taken from H&P  Martha [name] is a 74 y.o. female with medical history significant of  HTN, HLD, DM, COPD, tobacco abuse, hypothyroidism, gout, depression, tremor, proteinuria, depression, anxiety, GERD, diverticulitis, who presents with bilateral leg edema and pain, and SOB.  Patient states that she has chronic bilateral leg edema which has been progressively worsened recently. Both lower legs are painful and erythematous. She states that she gets her lower extremities wrapped once a week.  She has chronic cough and shortness of breath which has slightly worsened on exertion.  She coughs up some white mucus.  No fever or chills.  No chest pain.  On presentation she was hypoxic requiring BiPAP, later transitioned to 4 L of oxygen. Patient also has an history of protein urea which is being worked up by nephrology as an outpatient and they are planning for biopsy.  Patient had pertinent labs positive for BNP of 1113, troponin and COVID-19 negative.  Chest x-ray with borderline cardiomegaly and mild pulmonary edema.  Significant edema of bilateral lower extremities with signs of venous congestion, skin peeling and few shallow ulcers.  Cardiology was consulted.  Subjective: Patient was complaining of worsening of lower extremity pain while working with PT.  Daughter was at bedside, stating that patient lays in her bed and smoke the whole day.  She normally smokes 2- 3 packs a day.  Patient has COPD but does not use oxygen at home.  Assessment & Plan:   Principal Problem:   Acute CHF (congestive heart failure) (HCC) Active Problems:   COPD exacerbation (HCC)   Diabetes mellitus without complication ([place])   Current tobacco use   Hyperlipidemia   Essential hypertension   Hypothyroidism   Gout   Depression   Tremors of nervous system   Acute respiratory failure with  hypoxia (HCC)   Lower extremity cellulitis  Acute hypoxic respiratory failure due to combination of acute on chronic CHF and COPD exacerbation.  Patient continues to have few basal crackles when seen today.  No wheezing today.  She had an echocardiogram done in 2019 in [place] with normal EF, no comment on diastolic dysfunction but she does has LVH.  Normal stress testing done in 2019.  BNP at 1113 and pulmonary edema on chest x-ray. Repeat echocardiogram ordered-pending results. Cardiology was consulted-appreciate their recommendations -Continue with IV Lasix 40 mg twice daily. -Daily weight and BMP. -Pending Reds vest reading. -Strict intake and output.  COPD exacerbation.  No wheezing today but she continued to require 3 to 4 L of oxygen.  Patient is very heavy smoker with an history of COPD although not using any home oxygen but per daughter she lays in her bed all the time and become short of breath with minor exertions.  She might need oxygen on discharge. -Continue with bronchodilators. -Continue with Solu-Medrol-decrease dose to daily. -Continue with incentive spirometry. -Continue with supportive care. -Continue with supplemental oxygen to keep the saturation above 90%.  Bilateral lower extremity venous congestion VS cellulitis.  Patient do have mild leukocytosis.  Lower extremities were more consistent with venous congestion but do show some skin peeling and shallow ulceration without any purulent discharge. Procalcitonin negative.  Blood cultures remain negative.  Venous Doppler negative for DVT.  Wound care was consulted. -Patient will get [employer]. -She is already on ceftriaxone-continue for UTI and it will also help if  there is any element of cellulitis. -Continue with diuresis. -Continue with pain management -PT is recommending SNF placement.  UTI.  Patient was unable to specify UTI symptoms but appears pan positive. UA with pyuria, bacteriuria and positive  nitrites. -Add urine culture. -Continue with ceftriaxone which will help with UTI and possible cellulitis. -Follow-up urine cultures  Type 2 diabetes mellitus.  A1c of 6.5.  Patient was on Metformin and glipizide at home. -Continue with SSI.  Tobacco abuse.  Patient is a heavy smoker, smokes all day long. Nicotine allergy was listed in her chart. -Ordered nicotine patch at daughter's request as patient was craving a lot for smoking.  Essential hypertension.  Blood pressure mildly elevated but she was also experiencing a lot of pain. -Continue with Cozaar. -Patient is also on IV Lasix. -Continue with as needed hydralazine.  Hypothyroidism. -Continue with home dose of Synthroid  Hyperlipidemia. -Continue with Crestor.  History of gout.  No acute concerns. -Colchicine as needed.  History of depression. -Continue home meds  History of tremors. -Continue home dose of primidone.  Objective: Vitals:   04/04/20 0422 04/04/20 0447 04/04/20 0758 04/04/20 0800  BP: (!) 156/72   (!) 171/78  Pulse: 96   (!) 102  Resp:    20  Temp: 98.9 F (37.2 C)   98.7 F (37.1 C)  TempSrc: Oral   Oral  SpO2: 92% 91% 90%   Weight: 82.5 kg     Height:        Intake/Output Summary (Last 24 hours) at 04/04/2020 1315 Last data filed at 04/04/2020 1209 Gross per 24 hour  Intake 463 ml  Output 2200 ml  Net -1737 ml   Filed Weights   04/03/20 1131 04/04/20 0030 04/04/20 0422  Weight: 72 kg 82.5 kg 82.5 kg    Examination:  General exam:, Obese elderly lady, appears in pain. Respiratory system: Few basal crackles bilaterally, respiratory effort normal. Cardiovascular system: S1 & S2 heard, RRR.  Gastrointestinal system: Soft, nontender, nondistended, bowel sounds positive. Central nervous system: Alert and oriented. No focal neurological deficits.. Extremities: Bilateral 2+ lower extremity edema with signs of chronic venous congestion, skin peeling and shallow ulcers with clean  base. Psychiatry: Judgement and insight appear impaired.    DVT prophylaxis: Lovenox Code Status: Full Family Communication: Daughter was updated at bedside Disposition Plan:  Status is: Inpatient  Remains inpatient appropriate because:Inpatient level of care appropriate due to severity of illness   Dispo: The patient is from: Home              Anticipated d/c is to: SNF              Anticipated d/c date is: 2 days              Patient currently is not medically stable to d/c.    Consultants:   Cardiology  Procedures:  Antimicrobials:  Ceftriaxone  Data Reviewed: I have personally reviewed following labs and imaging studies  CBC: Recent Labs  Lab 03/31/20 0900 04/03/20 1141 04/04/20 0608  WBC 12.7* 10.7* 13.8*  NEUTROABS  --  9.4*  --   HGB 16.9* 17.5* 16.4*  HCT 52.6* 53.7* 51.7*  MCV 88.9 89.6 88.8  PLT 246 296 924   Basic Metabolic Panel: Recent Labs  Lab 04/03/20 1141 04/04/20 0608  NA 132* 134*  K 4.5 4.4  CL 95* 94*  CO2 28 29  GLUCOSE 156* 159*  BUN 16 16  CREATININE 0.86 0.70  CALCIUM 8.1* 8.1*  MG  --  1.7   GFR: Estimated Creatinine Clearance: 63.1 mL/min (by C-G formula based on SCr of 0.7 mg/dL). Liver Function Tests: Recent Labs  Lab 04/03/20 1141  AST 19  ALT 11  ALKPHOS 89  BILITOT 0.4  PROT 6.7  ALBUMIN 2.7*   No results for input(s): LIPASE, AMYLASE in the last 168 hours. No results for input(s): AMMONIA in the last 168 hours. Coagulation Profile: Recent Labs  Lab 03/31/20 0900 04/04/20 0608  INR 1.0 1.0   Cardiac Enzymes: No results for input(s): CKTOTAL, CKMB, CKMBINDEX, TROPONINI in the last 168 hours. BNP (last 3 results) No results for input(s): PROBNP in the last 8760 hours. HbA1C: Recent Labs    04/03/20 1410  HGBA1C 6.5*   CBG: Recent Labs  Lab 04/03/20 1735 04/03/20 2354 04/04/20 0752 04/04/20 1201  GLUCAP 127* 214* 172* 199*   Lipid Profile: No results for input(s): CHOL, HDL, LDLCALC, TRIG,  CHOLHDL, LDLDIRECT in the last 72 hours. Thyroid Function Tests: No results for input(s): TSH, T4TOTAL, FREET4, T3FREE, THYROIDAB in the last 72 hours. Anemia Panel: No results for input(s): VITAMINB12, FOLATE, FERRITIN, TIBC, IRON, RETICCTPCT in the last 72 hours. Sepsis Labs: Recent Labs  Lab 04/03/20 1141 04/04/20 0608  PROCALCITON <0.10 <0.10    Recent Results (from the past 240 hour(s))  Resp Panel by RT-PCR (Flu A&B, Covid) Nasopharyngeal Swab     Status: None   Collection Time: 04/03/20 11:41 AM   Specimen: Nasopharyngeal Swab; Nasopharyngeal(NP) swabs in vial transport medium  Result Value Ref Range Status   SARS Coronavirus 2 by RT PCR NEGATIVE NEGATIVE Final    Comment: (NOTE) SARS-CoV-2 target nucleic acids are NOT DETECTED.  The SARS-CoV-2 RNA is generally detectable in upper respiratory specimens during the acute phase of infection. The lowest concentration of SARS-CoV-2 viral copies this assay can detect is 138 copies/mL. A negative result does not preclude SARS-Cov-2 infection and should not be used as the sole basis for treatment or other patient management decisions. A negative result may occur with  improper specimen collection/handling, submission of specimen other than nasopharyngeal swab, presence of viral mutation(s) within the areas targeted by this assay, and inadequate number of viral copies(<138 copies/mL). A negative result must be combined with clinical observations, patient history, and epidemiological information. The expected result is Negative.  Fact Sheet for Patients:  EntrepreneurPulse.com.au  Fact Sheet for Healthcare Providers:  IncredibleEmployment.be  This test is no t yet approved or cleared by the [place] FDA and  has been authorized for detection and/or diagnosis of SARS-CoV-2 by FDA under an Emergency Use Authorization (EUA). This EUA will remain  in effect (meaning this test can be used)  for the duration of the COVID-19 declaration under Section 564(b)(1) of the Act, 21 U.S.C.section 360bbb-3(b)(1), unless the authorization is terminated  or revoked sooner.       Influenza A by PCR NEGATIVE NEGATIVE Final   Influenza B by PCR NEGATIVE NEGATIVE Final    Comment: (NOTE) The Xpert Xpress SARS-CoV-2/FLU/RSV plus assay is intended as an aid in the diagnosis of influenza from Nasopharyngeal swab specimens and should not be used as a sole basis for treatment. Nasal washings and aspirates are unacceptable for Xpert Xpress SARS-CoV-2/FLU/RSV testing.  Fact Sheet for Patients: EntrepreneurPulse.com.au  Fact Sheet for Healthcare Providers: IncredibleEmployment.be  This test is not yet approved or cleared by the [place] FDA and has been authorized for detection and/or diagnosis of SARS-CoV-2 by FDA under an Emergency Use Authorization (  EUA). This EUA will remain in effect (meaning this test can be used) for the duration of the COVID-19 declaration under Section 564(b)(1) of the Act, 21 U.S.C. section 360bbb-3(b)(1), unless the authorization is terminated or revoked.  Performed at [facility], [address] [place], [place] [address]   Culture, blood (Routine X 2) w Reflex to ID Panel     Status: None (Preliminary result)   Collection Time: 04/03/20  3:27 PM   Specimen: BLOOD  Result Value Ref Range Status   Specimen Description BLOOD BLOOD RIGHT ARM  Final   Special Requests   Final    BOTTLES DRAWN AEROBIC AND ANAEROBIC Blood Culture adequate volume   Culture   Final    NO GROWTH < 24 HOURS Performed at [facility], [address] [place], [place] [address]    Report Status PENDING  Incomplete  Culture, blood (Routine X 2) w Reflex to ID Panel     Status: None (Preliminary result)   Collection Time: 04/03/20 10:36 PM   Specimen: BLOOD  Result Value Ref Range Status   Specimen Description BLOOD  BLOOD LEFT HAND  Final   Special Requests   Final    BOTTLES DRAWN AEROBIC AND ANAEROBIC Blood Culture adequate volume   Culture   Final    NO GROWTH < 12 HOURS Performed at [facility], [address] [place], [place] [address]    Report Status PENDING  Incomplete     Radiology Studies: DG Chest 2 View  Result Date: 04/03/2020 CLINICAL DATA:  Hypoxia. Leg edema and shortness of breath. Pt daughter states that pt is supposed to be getting a renal bx but they are not able to do it because of the edema in pts legs. Pt states that she does feel short of breath. Pt does not wear oxygen at home. Pt placed on O2 in triage due to sats being in the 80s. Hx of asthma, chronic bronchitis, COPD, thyroid cancer- 2009, DM, current smoker. EXAM: CHEST - 2 VIEW COMPARISON:  01/04/2018 FINDINGS: Cardiac silhouette is borderline enlarged. Stable aortic atherosclerotic calcifications. No mediastinal or hilar masses. No evidence of adenopathy. Lungs demonstrate diffuse bilateral irregular interstitial thickening which has increased compared to the prior study. There is also linear/reticular scarring in the left lung base, which is stable. No lung consolidation. No pleural effusion and no pneumothorax. Skeletal structures are demineralized, but grossly intact. IMPRESSION: 1. Bilateral irregular interstitial thickening, increased compared to the prior study, along with borderline cardiomegaly. Suspect mild congestive heart failure. Consider diffuse interstitial infection or inflammation in the proper clinical setting. No evidence of lobar pneumonia. Electronically Signed   By: Lajean Manes M.D.   On: 04/03/2020 13:06   US Venous Img Lower Bilateral (DVT)  Result Date: 04/03/2020 CLINICAL DATA:  Lower leg cellulitis EXAM: BILATERAL LOWER EXTREMITY VENOUS DOPPLER ULTRASOUND TECHNIQUE: Gray-scale sonography with graded compression, as well as color Doppler and duplex ultrasound were performed to evaluate the  lower extremity deep venous systems from the level of the common femoral vein and including the common femoral, femoral, profunda femoral, popliteal and calf veins including the posterior tibial, peroneal and gastrocnemius veins when visible. The superficial great saphenous vein was also interrogated. Spectral Doppler was utilized to evaluate flow at rest and with distal augmentation maneuvers in the common femoral, femoral and popliteal veins. COMPARISON:  None. FINDINGS: RIGHT LOWER EXTREMITY Common Femoral Vein: No evidence of thrombus. Normal compressibility, respiratory phasicity and response to augmentation. Saphenofemoral Junction:  No evidence of thrombus. Normal compressibility and flow on color Doppler imaging. Profunda Femoral Vein: No evidence of thrombus. Normal compressibility and flow on color Doppler imaging. Femoral Vein: No evidence of thrombus. Normal compressibility, respiratory phasicity and response to augmentation. Popliteal Vein: Not well visualized due to overlying edema. Calf Veins: Not well visualized due to overlying edema. Superficial Great Saphenous Vein: No evidence of thrombus. Normal compressibility. Venous Reflux:  None. Other Findings:  None. LEFT LOWER EXTREMITY Common Femoral Vein: No evidence of thrombus. Normal compressibility, respiratory phasicity and response to augmentation. Saphenofemoral Junction: No evidence of thrombus. Normal compressibility and flow on color Doppler imaging. Profunda Femoral Vein: No evidence of thrombus. Normal compressibility and flow on color Doppler imaging. Femoral Vein: No evidence of thrombus. Normal compressibility, respiratory phasicity and response to augmentation. Popliteal Vein: Not well visualized due to overlying edema. Calf Veins: Not well visualized due to overlying edema. Superficial Great Saphenous Vein: No evidence of thrombus. Normal compressibility. Venous Reflux:  None. Other Findings:  None. IMPRESSION: Somewhat limited exam due  to peripheral edema although no central deep venous thrombosis is noted. Electronically Signed   By: Inez Catalina M.D.   On: 04/03/2020 15:58    Scheduled Meds: . vitamin C  1,000 mg Oral Daily  . aspirin EC  81 mg Oral Daily  . enoxaparin (LOVENOX) injection  40 mg Subcutaneous Q24H  . gabapentin  600 mg Oral TID  . insulin aspart  0-5 Units Subcutaneous QHS  . insulin aspart  0-9 Units Subcutaneous TID WC  . ipratropium-albuterol  3 mL Nebulization Q4H  . levothyroxine  150 mcg Oral Q0600  . loratadine  10 mg Oral Daily  . losartan  50 mg Oral Daily  . methylPREDNISolone (SOLU-MEDROL) injection  40 mg Intravenous Q12H  . nicotine  21 mg Transdermal Daily  . primidone  50 mg Oral BID  . rOPINIRole  0.5 mg Oral QHS  . rosuvastatin  5 mg Oral Daily  . sertraline  50 mg Oral Daily  . sodium chloride flush  3 mL Intravenous Q12H  . vitamin B-12  1,000 mcg Oral Daily   Continuous Infusions: . sodium chloride    . albumin human 25 g (04/04/20 1256)  . cefTRIAXone (ROCEPHIN)  IV 2 g (04/04/20 1051)  . furosemide (LASIX) 200 mg in dextrose 5% 100 mL (2mg /mL) infusion       LOS: 1 day   Time spent: 45 minutes  Lorella Nimrod, MD [facility]  If 7PM-7AM, please contact night-coverage [email or web address]  04/04/2020, 1:15 PM   This record has been created using Systems analyst. Errors have been sought and corrected,but may not always be located. Such creation errors do not reflect on the standard of care.

## 2020-04-04 NOTE — TOC Initial Note (Signed)
Transition of Care [facility]) - Initial/Assessment Note    Patient Details  Name: Martha [name] MRN: [id number] Date of Birth: 08/03/1945  [facility] CM/SW Contact:    Beverly Sessions, RN Phone Number: 04/04/2020, 4:17 PM  Clinical Narrative:                 Patient admitted from home with CHF Assessment completed with daughter Ms Rosana Hoes.   Patient lives at home with significant other Significant other is primary care giver   PCP [facility]  Patient has rollator, BSC, cane, hospital bed and WC in the home.   Currently requiring acute O2    PT recommending SNF.  Daughter states that she is in agreement and will discuss with patient in detail tonight.   Patient currently open with [facility] for [facility] boots.   Obtained PASRR fl2 sent for signature bedsearach initiated   Expected Discharge Plan: [place]     Patient Goals and CMS Choice        Expected Discharge Plan and Services Expected Discharge Plan: [place]       Living arrangements for the past 2 months: Single Family Home                                      Prior Living Arrangements/Services Living arrangements for the past 2 months: Single Family Home Lives with:: Significant Other Patient language and need for interpreter reviewed:: Yes        Need for Family Participation in Patient Care: Yes (Comment) Care giver support system in place?: Yes (comment) Current home services: DME Criminal Activity/Legal Involvement Pertinent to Current Situation/Hospitalization: No - Comment as needed  Activities of Daily Living Home Assistive Devices/Equipment: Bedside commode/3-in-1, Dentures (specify type), Eyeglasses ADL Screening (condition at time of admission) Patient's cognitive ability adequate to safely complete daily activities?: Yes Is the patient deaf or have difficulty hearing?: No Does the patient have difficulty  seeing, even when wearing glasses/contacts?: No Does the patient have difficulty concentrating, remembering, or making decisions?: No Patient able to express need for assistance with ADLs?: Yes Does the patient have difficulty dressing or bathing?: Yes Independently performs ADLs?: No Communication: Independent Dressing (OT): Needs assistance Is this a change from baseline?: Pre-admission baseline Grooming: Needs assistance Is this a change from baseline?: Pre-admission baseline Feeding: Independent Bathing: Needs assistance Is this a change from baseline?: Pre-admission baseline Toileting: Needs assistance Is this a change from baseline?: Pre-admission baseline In/Out Bed: Needs assistance Is this a change from baseline?: Pre-admission baseline Walks in Home: Independent with device (comment) (four wheel walker with seat) Does the patient have difficulty walking or climbing stairs?: Yes Weakness of Legs: Both Weakness of Arms/Hands: Both  Permission Sought/Granted                  Emotional Assessment              Admission diagnosis:  Acute pulmonary edema (HCC) [J81.0] Acute CHF (congestive heart failure) (HCC) [I50.9] Elevated brain natriuretic peptide (BNP) level [R79.89] Acute respiratory failure with hypoxia (HCC) [J96.01] Cellulitis of lower extremity [L03.119] Acute heart failure, unspecified heart failure type ([place]) [I50.9] Patient Active Problem List   Diagnosis Date Noted  . Acute pulmonary edema (HCC)   . Elevated brain natriuretic peptide (BNP) level   . Acute CHF (congestive heart failure) ([place])  04/03/2020  . Cellulitis of lower extremity 04/03/2020  . Hyperlipidemia   . Essential hypertension   . Hypothyroidism   . Gout   . Depression   . Tremors of nervous system   . Acute respiratory failure with hypoxia ([place])   . Pain in limb 09/26/2018  . Swelling of limb 09/26/2018  . Personal history of tobacco use, presenting hazards to health  09/29/2015  . COPD exacerbation ([place]) 03/08/2015  . Diabetes mellitus without complication ([place]) 77/93/9030  . BP (high blood pressure) 03/08/2015  . Malignant neoplasm of thyroid gland ([place]) 03/08/2015  . Has a tremor 03/08/2015  . Iron deficiency anemia due to chronic blood loss 03/08/2015  . Chronic anemia 08/24/2014  . Current tobacco use 04/27/2014   PCP:  Tracie Harrier, MD Pharmacy:   [facility] [address]), [place]) [place] [address] Phone: [phone] Fax: [place], [place]. [place] [address] Phone: [phone] Fax: [phone]     Social Determinants of Health (SDOH) Interventions    Readmission Risk Interventions No flowsheet data found.

## 2020-04-04 NOTE — Progress Notes (Signed)
Patient with rales. Loose wet cough. Asked her about going on bipap. Patient declined at this time

## 2020-04-04 NOTE — Consult Note (Signed)
CARDIOLOGY CONSULT NOTE               Patient ID: Martha [name] MRN: [id number] DOB/AGE: 75-Jul-1947 75 y.o.  Admit date: 04/03/2020 Referring Physician Dr Ivor Costa Primary Physician Dr. Sherrell [facility] Primary Cardiologist Dr. Saralyn Pilar Reason for Consultation leg edema cellulitis shortness of breath  HPI: Patient 74 year old with history of hypertension hyperlipidemia diabetes COPD smoking thyroid disease depression tremor GERD presents with bilateral leg edema swelling redness pain.  Patient appears to have cellulitis is having wound care done to her legs at home has been seen by vascular Dr. Lazaro Arms in the past patient also presents with severe hypoxemia COPD on BiPAP now she has a long history of smoking so presents to the emergency room for admission and evaluation denies any fever chills or sweats has been fairly noncompliant with therapy  Review of systems complete and found to be negative unless listed above     Past Medical History:  Diagnosis Date  . Abnormal LFTs (liver function tests)   . Anxiety   . Arthritis    knees, feet  . Asthma   . Chronic bronchitis ([place])   . Chronic constipation   . Chronic low back pain   . Cigarette smoker    Has cut back Smoking to 1 pack every other day  . COPD (chronic obstructive pulmonary disease) ([place])   . Depression   . Diabetic neuropathy ([place])   . Diverticulitis 2015   gi recommended repeat scope in 10 years  . Essential hypertension   . Fatty liver   . GERD (gastroesophageal reflux disease)   . Gout   . History of mammogram 05/28/2013  . Hyperlipidemia   . Hypothyroidism   . Insomnia   . Iron deficiency anemia   . Low serum vitamin D   . Personal history of tobacco use, presenting hazards to health 09/29/2015  . Plantar fasciitis   . RLS (restless legs syndrome)   . Shortness of breath dyspnea   . Thyroid cancer ([place]) 2009  . Tremors of nervous system    "I think I have parkinson's disease"  . Type 2 diabetes  mellitus ([place])   . Wears dentures    uppers    Past Surgical History:  Procedure Laterality Date  . CATARACT EXTRACTION W/PHACO Left 11/24/2015   Procedure: CATARACT EXTRACTION PHACO AND INTRAOCULAR LENS PLACEMENT (IOC);  Surgeon: Birder Robson, MD;  Location: [facility];  Service: Ophthalmology;  Laterality: Left;  [place] 38.3 AP% 20.6 CDE 7.89 FLUID PACK LOT # [id number]  . CATARACT EXTRACTION W/PHACO Right 06/23/2019   Procedure: CATARACT EXTRACTION PHACO AND INTRAOCULAR LENS PLACEMENT ([place]) RIGHT DIABETIC;  Surgeon: Birder Robson, MD;  Location: [place];  Service: Ophthalmology;  Laterality: Right;  Diabetic  . COLONOSCOPY  2004, 2009, 2015   [place]  . POLYPECTOMY  2009  . THYROIDECTOMY  2009   states she was tx with radiactive iodine  . TUBAL LIGATION    . UPPER GASTROINTESTINAL ENDOSCOPY      Medications Prior to Admission  Medication Sig Dispense Refill Last Dose  . alendronate (FOSAMAX) 70 MG tablet Take 70 mg by mouth once a week.      Marland Kitchen aspirin EC 81 MG tablet Take 81 mg by mouth daily.      . cetirizine (ZYRTEC) 10 MG tablet Take 10 mg by mouth daily.      . colchicine 0.6 MG tablet Take 0.6-1.2 mg by mouth See admin instructions. Take 2 tablets (  1.2mg ) by mouth as needed for acute gout flare - take 1 additional tablet (0.6mg ) after 1 hour if needed for continued gout pain   Unknown at PRN  . ferrous sulfate 325 (65 FE) MG EC tablet Take 325 mg by mouth daily.      . fluticasone (FLONASE) 50 MCG/ACT nasal spray Place 1-2 sprays into both nostrils daily as needed for allergies or rhinitis.    Unknown at PRN  . furosemide (LASIX) 40 MG tablet Take 40 mg by mouth daily.   12-24 hours at Unknown  . gabapentin (NEURONTIN) 600 MG tablet Take 600 mg by mouth 3 (three) times daily.   12-24 hours at Unknown  . glipiZIDE (GLUCOTROL) 10 MG tablet Take 10 mg by mouth 2 (two) times daily.    12-24 hours at Unknown  . levothyroxine (SYNTHROID, LEVOTHROID) 150 MCG tablet Take  150 mcg by mouth daily before breakfast.    04/03/2020 at 0700  . losartan (COZAAR) 50 MG tablet Take 50 mg by mouth daily.    04/03/2020 at 0700  . metFORMIN (GLUCOPHAGE) 1000 MG tablet Take 500 mg by mouth 2 (two) times daily.    04/03/2020 at 0700  . primidone (MYSOLINE) 50 MG tablet Take 50 mg by mouth 2 (two) times daily.    12-24 hours at Unknown  . PROAIR HFA 108 (90 BASE) MCG/ACT inhaler Inhale 2 Inhalers into the lungs every 6 (six) hours as needed for wheezing or shortness of breath.    Unknown at PRN  . rOPINIRole (REQUIP) 0.5 MG tablet Take 0.5 mg by mouth at bedtime.   04/02/2020 at 2100  . rosuvastatin (CRESTOR) 5 MG tablet Take 5 mg by mouth daily.    04/03/2020 at 0700  . sertraline (ZOLOFT) 50 MG tablet Take 50 mg by mouth daily.    12-24 hours at Unknown  . tiZANidine (ZANAFLEX) 2 MG tablet Take 2 mg by mouth 3 (three) times daily as needed for muscle spasms.    Unknown at PRN  . vitamin B-12 (CYANOCOBALAMIN) 1000 MCG tablet Take 1,000 mcg by mouth daily.     . vitamin C (ASCORBIC ACID) 500 MG tablet Take 1,000 mg by mouth daily.      . Vitamin D, Ergocalciferol, (DRISDOL) 50000 UNITS CAPS capsule Take 50,000 Units by mouth every Tuesday.       Social History   Socioeconomic History  . Marital status: Divorced    Spouse name: Not on file  . Number of children: Not on file  . Years of education: Not on file  . Highest education level: Not on file  Occupational History  . Not on file  Tobacco Use  . Smoking status: Current Every Day Smoker    Packs/day: 1.00    Years: 39.50    Pack years: 39.50    Types: Cigarettes  . Smokeless tobacco: Never Used  . Tobacco comment: curently smokes 12 cigerattes a day. I'm trying to cut back.  Vaping Use  . Vaping Use: Never used  Substance and Sexual Activity  . Alcohol use: No    Alcohol/week: 0.0 standard drinks  . Drug use: No  . Sexual activity: Never  Other Topics Concern  . Not on file  Social History Narrative  . Not on  file   Social Determinants of Health   Financial Resource Strain:   . Difficulty of Paying Living Expenses: Not on file  Food Insecurity:   . Worried About Charity fundraiser in the Last  Year: Not on file  . Ran Out of Food in the Last Year: Not on file  Transportation Needs:   . Lack of Transportation (Medical): Not on file  . Lack of Transportation (Non-Medical): Not on file  Physical Activity:   . Days of Exercise per Week: Not on file  . Minutes of Exercise per Session: Not on file  Stress:   . Feeling of Stress : Not on file  Social Connections:   . Frequency of Communication with Friends and Family: Not on file  . Frequency of Social Gatherings with Friends and Family: Not on file  . Attends Religious Services: Not on file  . Active Member of Clubs or Organizations: Not on file  . Attends Archivist Meetings: Not on file  . Marital Status: Not on file  Intimate Partner Violence:   . Fear of Current or Ex-Partner: Not on file  . Emotionally Abused: Not on file  . Physically Abused: Not on file  . Sexually Abused: Not on file    Family History  Problem Relation Age of Onset  . Diabetes Mellitus II Father   . Lung disease Father   . Diabetes Mellitus II Mother   . Arthritis Mother   . Thyroid disease Other   . Asthma Sister   . Emphysema Sister   . Hypertension Sister   . Breast cancer Paternal Aunt       Review of systems complete and found to be negative unless listed above      PHYSICAL EXAM  General: Well developed, well nourished, in no acute distress HEENT:  Normocephalic and atramatic Neck:  No JVD.  Lungs: Diffuse rhonchi  bilaterally to auscultation and percussion. Heart: HRRR . Normal S1 and S2 without gallops or murmurs.  Abdomen: Bowel sounds are positive, abdomen soft and non-tender  Msk:  Back normal, normal gait. Normal strength and tone for age. Extremities: Bilateral red swollen edematous feet with ulcers suggestive of  cellulitis 3+ pitting  edema.   Neuro: Alert and oriented X 3. Psych:  Good affect, responds appropriately  Labs:   Lab Results  Component Value Date   WBC 13.8 (H) 04/04/2020   HGB 16.4 (H) 04/04/2020   HCT 51.7 (H) 04/04/2020   MCV 88.8 04/04/2020   PLT 236 04/04/2020    Recent Labs  Lab 04/03/20 1141 04/03/20 1141 04/04/20 0608  NA 132*   < > 134*  K 4.5   < > 4.4  CL 95*   < > 94*  CO2 28   < > 29  BUN 16   < > 16  CREATININE 0.86   < > 0.70  CALCIUM 8.1*   < > 8.1*  PROT 6.7  --   --   BILITOT 0.4  --   --   ALKPHOS 89  --   --   ALT 11  --   --   AST 19  --   --   GLUCOSE 156*   < > 159*   < > = values in this interval not displayed.   No results found for: CKTOTAL, CKMB, CKMBINDEX, TROPONINI No results found for: CHOL No results found for: HDL No results found for: LDLCALC No results found for: TRIG No results found for: CHOLHDL No results found for: LDLDIRECT    Radiology: DG Chest 2 View  Result Date: 04/03/2020 CLINICAL DATA:  Hypoxia. Leg edema and shortness of breath. Pt daughter states that pt is supposed to be getting  a renal bx but they are not able to do it because of the edema in pts legs. Pt states that she does feel short of breath. Pt does not wear oxygen at home. Pt placed on O2 in triage due to sats being in the 80s. Hx of asthma, chronic bronchitis, COPD, thyroid cancer- 2009, DM, current smoker. EXAM: CHEST - 2 VIEW COMPARISON:  01/04/2018 FINDINGS: Cardiac silhouette is borderline enlarged. Stable aortic atherosclerotic calcifications. No mediastinal or hilar masses. No evidence of adenopathy. Lungs demonstrate diffuse bilateral irregular interstitial thickening which has increased compared to the prior study. There is also linear/reticular scarring in the left lung base, which is stable. No lung consolidation. No pleural effusion and no pneumothorax. Skeletal structures are demineralized, but grossly intact. IMPRESSION: 1. Bilateral irregular  interstitial thickening, increased compared to the prior study, along with borderline cardiomegaly. Suspect mild congestive heart failure. Consider diffuse interstitial infection or inflammation in the proper clinical setting. No evidence of lobar pneumonia. Electronically Signed   By: Lajean Manes M.D.   On: 04/03/2020 13:06   US Venous Img Lower Bilateral (DVT)  Result Date: 04/03/2020 CLINICAL DATA:  Lower leg cellulitis EXAM: BILATERAL LOWER EXTREMITY VENOUS DOPPLER ULTRASOUND TECHNIQUE: Gray-scale sonography with graded compression, as well as color Doppler and duplex ultrasound were performed to evaluate the lower extremity deep venous systems from the level of the common femoral vein and including the common femoral, femoral, profunda femoral, popliteal and calf veins including the posterior tibial, peroneal and gastrocnemius veins when visible. The superficial great saphenous vein was also interrogated. Spectral Doppler was utilized to evaluate flow at rest and with distal augmentation maneuvers in the common femoral, femoral and popliteal veins. COMPARISON:  None. FINDINGS: RIGHT LOWER EXTREMITY Common Femoral Vein: No evidence of thrombus. Normal compressibility, respiratory phasicity and response to augmentation. Saphenofemoral Junction: No evidence of thrombus. Normal compressibility and flow on color Doppler imaging. Profunda Femoral Vein: No evidence of thrombus. Normal compressibility and flow on color Doppler imaging. Femoral Vein: No evidence of thrombus. Normal compressibility, respiratory phasicity and response to augmentation. Popliteal Vein: Not well visualized due to overlying edema. Calf Veins: Not well visualized due to overlying edema. Superficial Great Saphenous Vein: No evidence of thrombus. Normal compressibility. Venous Reflux:  None. Other Findings:  None. LEFT LOWER EXTREMITY Common Femoral Vein: No evidence of thrombus. Normal compressibility, respiratory phasicity and response  to augmentation. Saphenofemoral Junction: No evidence of thrombus. Normal compressibility and flow on color Doppler imaging. Profunda Femoral Vein: No evidence of thrombus. Normal compressibility and flow on color Doppler imaging. Femoral Vein: No evidence of thrombus. Normal compressibility, respiratory phasicity and response to augmentation. Popliteal Vein: Not well visualized due to overlying edema. Calf Veins: Not well visualized due to overlying edema. Superficial Great Saphenous Vein: No evidence of thrombus. Normal compressibility. Venous Reflux:  None. Other Findings:  None. IMPRESSION: Somewhat limited exam due to peripheral edema although no central deep venous thrombosis is noted. Electronically Signed   By: Inez Catalina M.D.   On: 04/03/2020 15:58    EKG: Normal sinus rhythm nonspecific ST-T wave changes rate of 80  ASSESSMENT AND PLAN:  Acute congestive heart failure COPD with exacerbation Diabetes type 2 Smoking by history Hyperlipidemia Hypertension Acute respiratory failure with hypoxemia Chronic venous insufficiency Edema lower extremities Cellulitis lower extremities . Plan Agree with admission for heart failure and shortness of breath Antibiotic therapy for cellulitis of lower extremities Inhalers for COPD bronchitis Agree with Solu-Medrol and inhalers for COPD Diabetes type  2 uncomplicated continue Metformin therapy Maintain Crestor therapy for lipid management   Signed: Yolonda Kida MD 04/04/2020, 10:08 AM

## 2020-04-04 NOTE — Progress Notes (Signed)
[facility]  ROUNDING NOTE   Subjective:  Patient known to [place] from the office. She was recently noted as having significant edema along with proteinuria of at least 17 g. She was seen in the hospital today as she was unable to have renal biopsy performed given her underlying edema status as well as shortness of breath. Pure wick is in place and urine canister shows a significant amount of foam on top of her urine indicating significant proteinuria.   Objective:  Vital signs in last 24 hours:  Temp:  [97.5 F (36.4 C)-98.9 F (37.2 C)] 98.7 F (37.1 C) (12/06 0800) Pulse Rate:  [92-102] 102 (12/06 0800) Resp:  [14-23] 20 (12/06 0800) BP: (143-185)/(61-79) 171/78 (12/06 0800) SpO2:  [90 %-97 %] 90 % (12/06 0758) Weight:  [82.5 kg] 82.5 kg (12/06 0422)  Weight change:  Filed Weights   04/03/20 1131 04/04/20 0030 04/04/20 0422  Weight: 72 kg 82.5 kg 82.5 kg    Intake/Output: I/O last 3 completed shifts: In: 43 [P.O.:600; I.V.:3; IV Piggyback:100] Out: 3050 [Urine:3050]   Intake/Output this shift:  No intake/output data recorded.  Physical Exam: General:  No acute distress  Head:  Normocephalic, atraumatic. Moist oral mucosal membranes  Eyes:  Anicteric  Neck:  Supple  Lungs:   Scattered rhonchi and wheezing  Heart:  S1S2 no rubs  Abdomen:   Soft, nontender, bowel sounds present  Extremities:  3+ peripheral edema with legs wrapped  Neurologic:  Awake, alert, following commands  Skin:  No lesions  Access:  No hemodialysis access    Basic Metabolic Panel: Recent Labs  Lab 04/03/20 1141 04/04/20 0608  NA 132* 134*  K 4.5 4.4  CL 95* 94*  CO2 28 29  GLUCOSE 156* 159*  BUN 16 16  CREATININE 0.86 0.70  CALCIUM 8.1* 8.1*  MG  --  1.7    Liver Function Tests: Recent Labs  Lab 04/03/20 1141  AST 19  ALT 11  ALKPHOS 89  BILITOT 0.4  PROT 6.7  ALBUMIN 2.7*   No results for input(s): LIPASE, AMYLASE in the last 168 hours. No results for  input(s): AMMONIA in the last 168 hours.  CBC: Recent Labs  Lab 03/31/20 0900 04/03/20 1141 04/04/20 0608  WBC 12.7* 10.7* 13.8*  NEUTROABS  --  9.4*  --   HGB 16.9* 17.5* 16.4*  HCT 52.6* 53.7* 51.7*  MCV 88.9 89.6 88.8  PLT 246 296 236    Cardiac Enzymes: No results for input(s): CKTOTAL, CKMB, CKMBINDEX, TROPONINI in the last 168 hours.  BNP: Invalid input(s): POCBNP  CBG: Recent Labs  Lab 04/03/20 1735 04/03/20 2354 04/04/20 0752 04/04/20 1201 04/04/20 1649  GLUCAP 127* 214* 172* 199* 123*    Microbiology: Results for orders placed or performed during the hospital encounter of 04/03/20  Resp Panel by RT-PCR (Flu A&B, Covid) Nasopharyngeal Swab     Status: None   Collection Time: 04/03/20 11:41 AM   Specimen: Nasopharyngeal Swab; Nasopharyngeal(NP) swabs in vial transport medium  Result Value Ref Range Status   SARS Coronavirus 2 by RT PCR NEGATIVE NEGATIVE Final    Comment: (NOTE) SARS-CoV-2 target nucleic acids are NOT DETECTED.  The SARS-CoV-2 RNA is generally detectable in upper respiratory specimens during the acute phase of infection. The lowest concentration of SARS-CoV-2 viral copies this assay can detect is 138 copies/mL. A negative result does not preclude SARS-Cov-2 infection and should not be used as the sole basis for treatment or other patient management decisions.  A negative result may occur with  improper specimen collection/handling, submission of specimen other than nasopharyngeal swab, presence of viral mutation(s) within the areas targeted by this assay, and inadequate number of viral copies(<138 copies/mL). A negative result must be combined with clinical observations, patient history, and epidemiological information. The expected result is Negative.  Fact Sheet for Patients:  EntrepreneurPulse.com.au  Fact Sheet for Healthcare Providers:  IncredibleEmployment.be  This test is no t yet approved or  cleared by the [place] FDA and  has been authorized for detection and/or diagnosis of SARS-CoV-2 by FDA under an Emergency Use Authorization (EUA). This EUA will remain  in effect (meaning this test can be used) for the duration of the COVID-19 declaration under Section 564(b)(1) of the Act, 21 U.S.C.section 360bbb-3(b)(1), unless the authorization is terminated  or revoked sooner.       Influenza A by PCR NEGATIVE NEGATIVE Final   Influenza B by PCR NEGATIVE NEGATIVE Final    Comment: (NOTE) The Xpert Xpress SARS-CoV-2/FLU/RSV plus assay is intended as an aid in the diagnosis of influenza from Nasopharyngeal swab specimens and should not be used as a sole basis for treatment. Nasal washings and aspirates are unacceptable for Xpert Xpress SARS-CoV-2/FLU/RSV testing.  Fact Sheet for Patients: EntrepreneurPulse.com.au  Fact Sheet for Healthcare Providers: IncredibleEmployment.be  This test is not yet approved or cleared by the [place] FDA and has been authorized for detection and/or diagnosis of SARS-CoV-2 by FDA under an Emergency Use Authorization (EUA). This EUA will remain in effect (meaning this test can be used) for the duration of the COVID-19 declaration under Section 564(b)(1) of the Act, 21 U.S.C. section 360bbb-3(b)(1), unless the authorization is terminated or revoked.  Performed at [facility], [address] [place], [place] [address]   Culture, blood (Routine X 2) w Reflex to ID Panel     Status: None (Preliminary result)   Collection Time: 04/03/20  3:27 PM   Specimen: BLOOD  Result Value Ref Range Status   Specimen Description BLOOD BLOOD RIGHT ARM  Final   Special Requests   Final    BOTTLES DRAWN AEROBIC AND ANAEROBIC Blood Culture adequate volume   Culture   Final    NO GROWTH < 24 HOURS Performed at [facility], [address], [place], [place] [address]    Report Status PENDING   Incomplete  Culture, blood (Routine X 2) w Reflex to ID Panel     Status: None (Preliminary result)   Collection Time: 04/03/20 10:36 PM   Specimen: BLOOD  Result Value Ref Range Status   Specimen Description BLOOD BLOOD LEFT HAND  Final   Special Requests   Final    BOTTLES DRAWN AEROBIC AND ANAEROBIC Blood Culture adequate volume   Culture   Final    NO GROWTH < 12 HOURS Performed at [facility], [address], [place], [place] [address]    Report Status PENDING  Incomplete    Coagulation Studies: Recent Labs    04/04/20 0608  LABPROT 12.6  INR 1.0    Urinalysis: Recent Labs    04/03/20 1600  COLORURINE YELLOW*  LABSPEC 1.010  PHURINE 6.0  GLUCOSEU NEGATIVE  HGBUR SMALL*  BILIRUBINUR NEGATIVE  KETONESUR NEGATIVE  PROTEINUR 100*  NITRITE POSITIVE*  LEUKOCYTESUR SMALL*      Imaging: DG Chest 2 View  Result Date: 04/03/2020 CLINICAL DATA:  Hypoxia. Leg edema and shortness of breath. Pt daughter states that pt is supposed to be getting a  renal bx but they are not able to do it because of the edema in pts legs. Pt states that she does feel short of breath. Pt does not wear oxygen at home. Pt placed on O2 in triage due to sats being in the 80s. Hx of asthma, chronic bronchitis, COPD, thyroid cancer- 2009, DM, current smoker. EXAM: CHEST - 2 VIEW COMPARISON:  01/04/2018 FINDINGS: Cardiac silhouette is borderline enlarged. Stable aortic atherosclerotic calcifications. No mediastinal or hilar masses. No evidence of adenopathy. Lungs demonstrate diffuse bilateral irregular interstitial thickening which has increased compared to the prior study. There is also linear/reticular scarring in the left lung base, which is stable. No lung consolidation. No pleural effusion and no pneumothorax. Skeletal structures are demineralized, but grossly intact. IMPRESSION: 1. Bilateral irregular interstitial thickening, increased compared to the prior study, along with borderline  cardiomegaly. Suspect mild congestive heart failure. Consider diffuse interstitial infection or inflammation in the proper clinical setting. No evidence of lobar pneumonia. Electronically Signed   By: Lajean Manes M.D.   On: 04/03/2020 13:06   US Venous Img Lower Bilateral (DVT)  Result Date: 04/03/2020 CLINICAL DATA:  Lower leg cellulitis EXAM: BILATERAL LOWER EXTREMITY VENOUS DOPPLER ULTRASOUND TECHNIQUE: Gray-scale sonography with graded compression, as well as color Doppler and duplex ultrasound were performed to evaluate the lower extremity deep venous systems from the level of the common femoral vein and including the common femoral, femoral, profunda femoral, popliteal and calf veins including the posterior tibial, peroneal and gastrocnemius veins when visible. The superficial great saphenous vein was also interrogated. Spectral Doppler was utilized to evaluate flow at rest and with distal augmentation maneuvers in the common femoral, femoral and popliteal veins. COMPARISON:  None. FINDINGS: RIGHT LOWER EXTREMITY Common Femoral Vein: No evidence of thrombus. Normal compressibility, respiratory phasicity and response to augmentation. Saphenofemoral Junction: No evidence of thrombus. Normal compressibility and flow on color Doppler imaging. Profunda Femoral Vein: No evidence of thrombus. Normal compressibility and flow on color Doppler imaging. Femoral Vein: No evidence of thrombus. Normal compressibility, respiratory phasicity and response to augmentation. Popliteal Vein: Not well visualized due to overlying edema. Calf Veins: Not well visualized due to overlying edema. Superficial Great Saphenous Vein: No evidence of thrombus. Normal compressibility. Venous Reflux:  None. Other Findings:  None. LEFT LOWER EXTREMITY Common Femoral Vein: No evidence of thrombus. Normal compressibility, respiratory phasicity and response to augmentation. Saphenofemoral Junction: No evidence of thrombus. Normal  compressibility and flow on color Doppler imaging. Profunda Femoral Vein: No evidence of thrombus. Normal compressibility and flow on color Doppler imaging. Femoral Vein: No evidence of thrombus. Normal compressibility, respiratory phasicity and response to augmentation. Popliteal Vein: Not well visualized due to overlying edema. Calf Veins: Not well visualized due to overlying edema. Superficial Great Saphenous Vein: No evidence of thrombus. Normal compressibility. Venous Reflux:  None. Other Findings:  None. IMPRESSION: Somewhat limited exam due to peripheral edema although no central deep venous thrombosis is noted. Electronically Signed   By: Inez Catalina M.D.   On: 04/03/2020 15:58   ECHOCARDIOGRAM COMPLETE  Result Date: 04/04/2020    ECHOCARDIOGRAM REPORT   Patient Name:   Martha [name] Date of Exam: 04/04/2020 Medical Rec #:  [id number]      Height:       64.0 in Accession #:    [id number]     Weight:       181.8 lb Date of Birth:  1945-07-13     BSA:  1.879 m Patient Age:    74 years       BP:           171/78 mmHg Patient Gender: F              HR:           98 bpm. Exam Location:  [facility] Procedure: 2D Echo, Color Doppler, Cardiac Doppler and Intracardiac            Opacification Agent Indications:     I50.31 CHF-Acute Diastolic  History:         Patient has no prior history of Echocardiogram examinations.                  COPD, Signs/Symptoms:Shortness of Breath; Risk Factors:Diabetes                  and Current Smoker.  Sonographer:     Charmayne Sheer RDCS (AE) Referring Phys:  Baker Janus Soledad Gerlach NIU Diagnosing Phys: Bartholome Bill MD  Sonographer Comments: Technically difficult study due to poor echo windows. Image acquisition challenging due to patient body habitus and Image acquisition challenging due to COPD. IMPRESSIONS  1. Left ventricular ejection fraction, by estimation, is 55 to 60%. The left ventricle has normal function. The left ventricle has no regional wall motion abnormalities. Left  ventricular diastolic parameters are consistent with Grade I diastolic dysfunction (impaired relaxation).  2. Right ventricular systolic function is normal. The right ventricular size is mildly enlarged.  3. Left atrial size was mildly dilated.  4. Right atrial size was mildly dilated.  5. The mitral valve was not well visualized. Trivial mitral valve regurgitation.  6. The aortic valve was not well visualized. Aortic valve regurgitation is trivial. FINDINGS  Left Ventricle: Left ventricular ejection fraction, by estimation, is 55 to 60%. The left ventricle has normal function. The left ventricle has no regional wall motion abnormalities. Definity contrast agent was given IV to delineate the left ventricular  endocardial borders. The left ventricular internal cavity size was normal in size. There is borderline left ventricular hypertrophy. Left ventricular diastolic parameters are consistent with Grade I diastolic dysfunction (impaired relaxation). Right Ventricle: The right ventricular size is mildly enlarged. No increase in right ventricular wall thickness. Right ventricular systolic function is normal. Left Atrium: Left atrial size was mildly dilated. Right Atrium: Right atrial size was mildly dilated. Pericardium: There is no evidence of pericardial effusion. Mitral Valve: The mitral valve was not well visualized. Trivial mitral valve regurgitation. MV peak gradient, 7.4 mmHg. The mean mitral valve gradient is 3.0 mmHg. Tricuspid Valve: The tricuspid valve is not well visualized. Tricuspid valve regurgitation is trivial. Aortic Valve: The aortic valve was not well visualized. Aortic valve regurgitation is trivial. Aortic valve mean gradient measures 3.0 mmHg. Aortic valve peak gradient measures 6.8 mmHg. Aortic valve area, by VTI measures 2.46 cm. Pulmonic Valve: The pulmonic valve was not well visualized. Pulmonic valve regurgitation is not visualized. Aorta: The aortic root was not well visualized.  IAS/Shunts: The interatrial septum was not assessed.  LEFT VENTRICLE PLAX 2D LVIDd:         4.15 cm     Diastology LVIDs:         3.64 cm     LV e' medial:    5.98 cm/s LV PW:         1.43 cm     LV E/e' medial:  18.3 LV IVS:        1.12 cm  LV e' lateral:   6.53 cm/s LVOT diam:     2.00 cm     LV E/e' lateral: 16.8 LV SV:         55 LV SV Index:   29 LVOT Area:     3.14 cm  LV Volumes (MOD) LV vol d, MOD A2C: 55.5 ml LV vol d, MOD A4C: 63.8 ml LV vol s, MOD A2C: 34.3 ml LV vol s, MOD A4C: 23.4 ml LV SV MOD A2C:     21.2 ml LV SV MOD A4C:     63.8 ml LV SV MOD BP:      34.2 ml RIGHT VENTRICLE RV Basal diam:  3.76 cm LEFT ATRIUM             Index       RIGHT ATRIUM           Index LA diam:        4.90 cm 2.61 cm/m  RA Area:     19.80 cm LA Vol (A2C):   59.5 ml 31.67 ml/m RA Volume:   61.10 ml  32.52 ml/m LA Vol (A4C):   57.5 ml 30.61 ml/m LA Biplane Vol: 61.2 ml 32.58 ml/m  AORTIC VALVE                   PULMONIC VALVE AV Area (Vmax):    2.51 cm    PV Vmax:       0.76 m/s AV Area (Vmean):   2.43 cm    PV Vmean:      53.200 cm/s AV Area (VTI):     2.46 cm    PV VTI:        0.126 m AV Vmax:           130.00 cm/s PV Peak grad:  2.3 mmHg AV Vmean:          83.500 cm/s PV Mean grad:  1.0 mmHg AV VTI:            0.225 m AV Peak Grad:      6.8 mmHg AV Mean Grad:      3.0 mmHg LVOT Vmax:         104.00 cm/s LVOT Vmean:        64.700 cm/s LVOT VTI:          0.176 m LVOT/AV VTI ratio: 0.78  AORTA Ao Root diam: 3.10 cm MITRAL VALVE                TRICUSPID VALVE MV Area (PHT): 3.00 cm     TR Peak grad:   28.9 mmHg MV Peak grad:  7.4 mmHg     TR Vmax:        269.00 cm/s MV Mean grad:  3.0 mmHg MV Vmax:       1.36 m/s     SHUNTS MV Vmean:      86.9 cm/s    Systemic VTI:  0.18 m MV Decel Time: 253 msec     Systemic Diam: 2.00 cm MV E velocity: 109.50 cm/s MV A velocity: 137.50 cm/s MV E/A ratio:  0.80 Bartholome Bill MD Electronically signed by Bartholome Bill MD Signature Date/Time: 04/04/2020/4:54:02 PM    Final       Medications:   . sodium chloride    . albumin human 25 g (04/04/20 1256)  . [START ON 04/05/2020] cefTRIAXone (ROCEPHIN)  IV    . furosemide (LASIX) 200 mg in dextrose 5% 100 mL (2mg /mL)  infusion 6 mg/hr (04/04/20 1429)   . vitamin C  1,000 mg Oral Daily  . aspirin EC  81 mg Oral Daily  . enoxaparin (LOVENOX) injection  40 mg Subcutaneous Q24H  . gabapentin  600 mg Oral TID  . insulin aspart  0-5 Units Subcutaneous QHS  . insulin aspart  0-9 Units Subcutaneous TID WC  . ipratropium-albuterol  3 mL Nebulization Q4H  . levothyroxine  150 mcg Oral Q0600  . loratadine  10 mg Oral Daily  . losartan  50 mg Oral Daily  . methylPREDNISolone (SOLU-MEDROL) injection  40 mg Intravenous Q12H  . nicotine  21 mg Transdermal Daily  . primidone  50 mg Oral BID  . rOPINIRole  0.5 mg Oral QHS  . rosuvastatin  5 mg Oral Daily  . sertraline  50 mg Oral Daily  . sodium chloride flush  3 mL Intravenous Q12H  . vitamin B-12  1,000 mcg Oral Daily   sodium chloride, acetaminophen, albuterol, colchicine, dextromethorphan-guaiFENesin, fentaNYL (SUBLIMAZE) injection, fluticasone, hydrALAZINE, ondansetron (ZOFRAN) IV, oxyCODONE-acetaminophen, sodium chloride flush, tiZANidine  Assessment/ Plan:  74 y.o. female with past medical history of COPD, anxiety, hypertension, longstanding diabetes mellitus type 2, peripheral vascular disease, longstanding tobacco abuse who has nephrotic range proteinuria of 17 g.  1.  Proteinuria, 17 g.  Patient with nephrotic syndrome.  She was to undergo biopsy but could not lay flat on her stomach due to COPD and volume overload.  To treat the volume overload we will plan to place the patient on Lasix drip along with albumin 25 g IV every 8 hours.  We will see if this helps her respiratory status so that she can have renal biopsy performed.  We will also now check SPEP, UPEP, ANA, ANCA device, GBM antibodies, C3, C4  2.  Generalized edema/volume overload.  As above we are  starting the patient on Lasix drip.  3.  Hypertension.  Maintain the patient on losartan for hypertension control as well as proteinuria reduction.  LOS: 1 Jovanie Verge 12/6/20217:51 PM

## 2020-04-05 LAB — PROTEIN / CREATININE RATIO, URINE
Creatinine, Urine: 18 mg/dL
Protein Creatinine Ratio: 4.28 mg/mg{Cre} — ABNORMAL HIGH (ref 0.00–0.15)
Total Protein, Urine: 77 mg/dL

## 2020-04-05 LAB — BASIC METABOLIC PANEL
Anion gap: 11 (ref 5–15)
BUN: 17 mg/dL (ref 8–23)
CO2: 32 mmol/L (ref 22–32)
Calcium: 8.1 mg/dL — ABNORMAL LOW (ref 8.9–10.3)
Chloride: 92 mmol/L — ABNORMAL LOW (ref 98–111)
Creatinine, Ser: 0.71 mg/dL (ref 0.44–1.00)
GFR, Estimated: >60 mL/min (ref >60)
Glucose, Bld: 147 mg/dL — ABNORMAL HIGH (ref 70–99)
Potassium: 4.2 mmol/L (ref 3.5–5.1)
Sodium: 135 mmol/L (ref 135–145)

## 2020-04-05 LAB — GLUCOSE, CAPILLARY
Glucose-Capillary: 170 mg/dL — ABNORMAL HIGH (ref 70–99)
Glucose-Capillary: 193 mg/dL — ABNORMAL HIGH (ref 70–99)
Glucose-Capillary: 130 mg/dL — ABNORMAL HIGH (ref 70–99)
Glucose-Capillary: 178 mg/dL — ABNORMAL HIGH (ref 70–99)

## 2020-04-05 LAB — MAGNESIUM: Magnesium: 2.2 mg/dL (ref 1.7–2.4)

## 2020-04-05 MED ORDER — FLUTICASONE PROPIONATE 50 MCG/ACT NA SUSP
1.0000 | Freq: Two times a day (BID) | NASAL | Status: DC
  Administered 2020-04-05 – 2020-04-21 (×23): 1 via NASAL
  Filled 2020-04-05 (×2): qty 16

## 2020-04-05 MED ORDER — PREDNISONE 50 MG PO TABS
50.0000 mg | ORAL_TABLET | Freq: Every day | ORAL | Status: DC
  Administered 2020-04-06 – 2020-04-07 (×2): 50 mg via ORAL
  Filled 2020-04-05 (×2): qty 1

## 2020-04-05 NOTE — Progress Notes (Signed)
[facility] Cardiology    SUBJECTIVE: Patient significantly confused altered mental status.  Episodes of shortness of breath and dyspnea denies pain   Vitals:   04/05/20 0015 04/05/20 0305 04/05/20 0753 04/05/20 0845  BP:  129/65 (!) 160/84   Pulse:  93 100   Resp:  20 16   Temp:  98.7 F (37.1 C) 98.7 F (37.1 C)   TempSrc:  Oral Oral   SpO2: 91% 95% 94% 94%  Weight:  80.2 kg    Height:         Intake/Output Summary (Last 24 hours) at 04/05/2020 1108 Last data filed at 04/05/2020 1020 Gross per 24 hour  Intake 593.65 ml  Output 3450 ml  Net -2856.35 ml      PHYSICAL EXAM  General: Well developed, well nourished, in no acute distress HEENT:  Normocephalic and atramatic Neck:  No JVD.  Lungs: Rhonchi bilaterally to auscultation and percussion. Heart: HRRR . Normal S1 and S2 without gallops or murmurs.  Abdomen: Bowel sounds are positive, abdomen soft and non-tender  Msk:  Back normal, normal gait. Normal strength and tone for age. Extremities: No clubbing, cyanosis or 2+ erythema swelling ulcers edema.   Neuro: Alert and oriented X 3. Psych:  Good affect, responds appropriately   LABS: Basic Metabolic Panel: Recent Labs    04/04/20 0608 04/05/20 0546  NA 134* 135  K 4.4 4.2  CL 94* 92*  CO2 29 32  GLUCOSE 159* 147*  BUN 16 17  CREATININE 0.70 0.71  CALCIUM 8.1* 8.1*  MG 1.7 2.2   Liver Function Tests: Recent Labs    04/03/20 1141  AST 19  ALT 11  ALKPHOS 89  BILITOT 0.4  PROT 6.7  ALBUMIN 2.7*   No results for input(s): LIPASE, AMYLASE in the last 72 hours. CBC: Recent Labs    04/03/20 1141 04/04/20 0608  WBC 10.7* 13.8*  NEUTROABS 9.4*  --   HGB 17.5* 16.4*  HCT 53.7* 51.7*  MCV 89.6 88.8  PLT 296 236   Cardiac Enzymes: No results for input(s): CKTOTAL, CKMB, CKMBINDEX, TROPONINI in the last 72 hours. BNP: Invalid input(s): POCBNP D-Dimer: No results for input(s): DDIMER in the last 72 hours. Hemoglobin A1C: Recent Labs     04/03/20 1410  HGBA1C 6.5*   Fasting Lipid Panel: No results for input(s): CHOL, HDL, LDLCALC, TRIG, CHOLHDL, LDLDIRECT in the last 72 hours. Thyroid Function Tests: No results for input(s): TSH, T4TOTAL, T3FREE, THYROIDAB in the last 72 hours.  Invalid input(s): FREET3 Anemia Panel: No results for input(s): VITAMINB12, FOLATE, FERRITIN, TIBC, IRON, RETICCTPCT in the last 72 hours.  DG Chest 2 View  Result Date: 04/03/2020 CLINICAL DATA:  Hypoxia. Leg edema and shortness of breath. Pt daughter states that pt is supposed to be getting a renal bx but they are not able to do it because of the edema in pts legs. Pt states that she does feel short of breath. Pt does not wear oxygen at home. Pt placed on O2 in triage due to sats being in the 80s. Hx of asthma, chronic bronchitis, COPD, thyroid cancer- 2009, DM, current smoker. EXAM: CHEST - 2 VIEW COMPARISON:  01/04/2018 FINDINGS: Cardiac silhouette is borderline enlarged. Stable aortic atherosclerotic calcifications. No mediastinal or hilar masses. No evidence of adenopathy. Lungs demonstrate diffuse bilateral irregular interstitial thickening which has increased compared to the prior study. There is also linear/reticular scarring in the left lung base, which is stable. No lung consolidation. No pleural effusion and no  pneumothorax. Skeletal structures are demineralized, but grossly intact. IMPRESSION: 1. Bilateral irregular interstitial thickening, increased compared to the prior study, along with borderline cardiomegaly. Suspect mild congestive heart failure. Consider diffuse interstitial infection or inflammation in the proper clinical setting. No evidence of lobar pneumonia. Electronically Signed   By: Lajean Manes M.D.   On: 04/03/2020 13:06   US Venous Img Lower Bilateral (DVT)  Result Date: 04/03/2020 CLINICAL DATA:  Lower leg cellulitis EXAM: BILATERAL LOWER EXTREMITY VENOUS DOPPLER ULTRASOUND TECHNIQUE: Gray-scale sonography with graded  compression, as well as color Doppler and duplex ultrasound were performed to evaluate the lower extremity deep venous systems from the level of the common femoral vein and including the common femoral, femoral, profunda femoral, popliteal and calf veins including the posterior tibial, peroneal and gastrocnemius veins when visible. The superficial great saphenous vein was also interrogated. Spectral Doppler was utilized to evaluate flow at rest and with distal augmentation maneuvers in the common femoral, femoral and popliteal veins. COMPARISON:  None. FINDINGS: RIGHT LOWER EXTREMITY Common Femoral Vein: No evidence of thrombus. Normal compressibility, respiratory phasicity and response to augmentation. Saphenofemoral Junction: No evidence of thrombus. Normal compressibility and flow on color Doppler imaging. Profunda Femoral Vein: No evidence of thrombus. Normal compressibility and flow on color Doppler imaging. Femoral Vein: No evidence of thrombus. Normal compressibility, respiratory phasicity and response to augmentation. Popliteal Vein: Not well visualized due to overlying edema. Calf Veins: Not well visualized due to overlying edema. Superficial Great Saphenous Vein: No evidence of thrombus. Normal compressibility. Venous Reflux:  None. Other Findings:  None. LEFT LOWER EXTREMITY Common Femoral Vein: No evidence of thrombus. Normal compressibility, respiratory phasicity and response to augmentation. Saphenofemoral Junction: No evidence of thrombus. Normal compressibility and flow on color Doppler imaging. Profunda Femoral Vein: No evidence of thrombus. Normal compressibility and flow on color Doppler imaging. Femoral Vein: No evidence of thrombus. Normal compressibility, respiratory phasicity and response to augmentation. Popliteal Vein: Not well visualized due to overlying edema. Calf Veins: Not well visualized due to overlying edema. Superficial Great Saphenous Vein: No evidence of thrombus. Normal  compressibility. Venous Reflux:  None. Other Findings:  None. IMPRESSION: Somewhat limited exam due to peripheral edema although no central deep venous thrombosis is noted. Electronically Signed   By: Inez Catalina M.D.   On: 04/03/2020 15:58   ECHOCARDIOGRAM COMPLETE  Result Date: 04/04/2020    ECHOCARDIOGRAM REPORT   Patient Name:   Martha [name] Date of Exam: 04/04/2020 Medical Rec #:  [id number]      Height:       64.0 in Accession #:    [id number]     Weight:       181.8 lb Date of Birth:  May 30, 1945     BSA:          1.879 m Patient Age:    49 years       BP:           171/78 mmHg Patient Gender: F              HR:           98 bpm. Exam Location:  [facility] Procedure: 2D Echo, Color Doppler, Cardiac Doppler and Intracardiac            Opacification Agent Indications:     I50.31 CHF-Acute Diastolic  History:         Patient has no prior history of Echocardiogram examinations.  COPD, Signs/Symptoms:Shortness of Breath; Risk Factors:Diabetes                  and Current Smoker.  Sonographer:     Charmayne Sheer RDCS (AE) Referring Phys:  Baker Janus Soledad Gerlach NIU Diagnosing Phys: Bartholome Bill MD  Sonographer Comments: Technically difficult study due to poor echo windows. Image acquisition challenging due to patient body habitus and Image acquisition challenging due to COPD. IMPRESSIONS  1. Left ventricular ejection fraction, by estimation, is 55 to 60%. The left ventricle has normal function. The left ventricle has no regional wall motion abnormalities. Left ventricular diastolic parameters are consistent with Grade I diastolic dysfunction (impaired relaxation).  2. Right ventricular systolic function is normal. The right ventricular size is mildly enlarged.  3. Left atrial size was mildly dilated.  4. Right atrial size was mildly dilated.  5. The mitral valve was not well visualized. Trivial mitral valve regurgitation.  6. The aortic valve was not well visualized. Aortic valve regurgitation is trivial. FINDINGS   Left Ventricle: Left ventricular ejection fraction, by estimation, is 55 to 60%. The left ventricle has normal function. The left ventricle has no regional wall motion abnormalities. Definity contrast agent was given IV to delineate the left ventricular  endocardial borders. The left ventricular internal cavity size was normal in size. There is borderline left ventricular hypertrophy. Left ventricular diastolic parameters are consistent with Grade I diastolic dysfunction (impaired relaxation). Right Ventricle: The right ventricular size is mildly enlarged. No increase in right ventricular wall thickness. Right ventricular systolic function is normal. Left Atrium: Left atrial size was mildly dilated. Right Atrium: Right atrial size was mildly dilated. Pericardium: There is no evidence of pericardial effusion. Mitral Valve: The mitral valve was not well visualized. Trivial mitral valve regurgitation. MV peak gradient, 7.4 mmHg. The mean mitral valve gradient is 3.0 mmHg. Tricuspid Valve: The tricuspid valve is not well visualized. Tricuspid valve regurgitation is trivial. Aortic Valve: The aortic valve was not well visualized. Aortic valve regurgitation is trivial. Aortic valve mean gradient measures 3.0 mmHg. Aortic valve peak gradient measures 6.8 mmHg. Aortic valve area, by VTI measures 2.46 cm. Pulmonic Valve: The pulmonic valve was not well visualized. Pulmonic valve regurgitation is not visualized. Aorta: The aortic root was not well visualized. IAS/Shunts: The interatrial septum was not assessed.  LEFT VENTRICLE PLAX 2D LVIDd:         4.15 cm     Diastology LVIDs:         3.64 cm     LV e' medial:    5.98 cm/s LV PW:         1.43 cm     LV E/e' medial:  18.3 LV IVS:        1.12 cm     LV e' lateral:   6.53 cm/s LVOT diam:     2.00 cm     LV E/e' lateral: 16.8 LV SV:         55 LV SV Index:   29 LVOT Area:     3.14 cm  LV Volumes (MOD) LV vol d, MOD A2C: 55.5 ml LV vol d, MOD A4C: 63.8 ml LV vol s, MOD A2C:  34.3 ml LV vol s, MOD A4C: 23.4 ml LV SV MOD A2C:     21.2 ml LV SV MOD A4C:     63.8 ml LV SV MOD BP:      34.2 ml RIGHT VENTRICLE RV Basal diam:  3.76 cm LEFT ATRIUM  Index       RIGHT ATRIUM           Index LA diam:        4.90 cm 2.61 cm/m  RA Area:     19.80 cm LA Vol (A2C):   59.5 ml 31.67 ml/m RA Volume:   61.10 ml  32.52 ml/m LA Vol (A4C):   57.5 ml 30.61 ml/m LA Biplane Vol: 61.2 ml 32.58 ml/m  AORTIC VALVE                   PULMONIC VALVE AV Area (Vmax):    2.51 cm    PV Vmax:       0.76 m/s AV Area (Vmean):   2.43 cm    PV Vmean:      53.200 cm/s AV Area (VTI):     2.46 cm    PV VTI:        0.126 m AV Vmax:           130.00 cm/s PV Peak grad:  2.3 mmHg AV Vmean:          83.500 cm/s PV Mean grad:  1.0 mmHg AV VTI:            0.225 m AV Peak Grad:      6.8 mmHg AV Mean Grad:      3.0 mmHg LVOT Vmax:         104.00 cm/s LVOT Vmean:        64.700 cm/s LVOT VTI:          0.176 m LVOT/AV VTI ratio: 0.78  AORTA Ao Root diam: 3.10 cm MITRAL VALVE                TRICUSPID VALVE MV Area (PHT): 3.00 cm     TR Peak grad:   28.9 mmHg MV Peak grad:  7.4 mmHg     TR Vmax:        269.00 cm/s MV Mean grad:  3.0 mmHg MV Vmax:       1.36 m/s     SHUNTS MV Vmean:      86.9 cm/s    Systemic VTI:  0.18 m MV Decel Time: 253 msec     Systemic Diam: 2.00 cm MV E velocity: 109.50 cm/s MV A velocity: 137.50 cm/s MV E/A ratio:  0.80 Bartholome Bill MD Electronically signed by Bartholome Bill MD Signature Date/Time: 04/04/2020/4:54:02 PM    Final      Echo normal overall left ventricular function of around 55%  TELEMETRY: Normal sinus rhythm rate is about 90  ASSESSMENT AND PLAN:  Principal Problem:   Acute CHF (congestive heart failure) (HCC) Active Problems:   COPD exacerbation (HCC)   Diabetes mellitus without complication ([place])   Current tobacco use   Hyperlipidemia   Essential hypertension   Hypothyroidism   Gout   Depression   Tremors of nervous system   Acute respiratory failure with  hypoxia (HCC)   Cellulitis of lower extremity   Acute pulmonary edema (HCC)   Elevated brain natriuretic peptide (BNP) level    Plan Agree with continue telemetry Maintain monitoring rule out myocardial infarction follow-up EKGs Continue hypertension management and control Avoid smoking and tobacco abuse Supplemental oxygen as necessary Antibiotic therapy for suspected cellulitis of lower extremities Altered mental status consider input by neurology for evaluation of confusion Inhalers steroids COPD exacerbation Statin therapy for hyperlipidemia Diuretic therapy as necessary for failure Elevated troponins probably demand ischemia  Yolonda Kida, MD 04/05/2020 11:08 AM

## 2020-04-05 NOTE — Progress Notes (Addendum)
PROGRESS NOTE    Martha [name]  WUJ:[id number] DOB: [birth year] DOA: 04/03/2020 PCP: Tracie Harrier, MD   Brief Narrative: Taken from H&P  Martha [name] is a 74 y.o. female with medical history significant of  HTN, HLD, DM, COPD, tobacco abuse, hypothyroidism, gout, depression, tremor, proteinuria, depression, anxiety, GERD, diverticulitis, who presents with bilateral leg edema and pain, and SOB.  Patient states that she has chronic bilateral leg edema which has been progressively worsened recently. Both lower legs are painful and erythematous. She states that she gets her lower extremities wrapped once a week.  She has chronic cough and shortness of breath which has slightly worsened on exertion.  She coughs up some white mucus.  No fever or chills.  No chest pain.  On presentation she was hypoxic requiring BiPAP, later transitioned to 4 L of oxygen. Patient also has an history of protein urea which is being worked up by nephrology as an outpatient and they are planning for biopsy.  Patient had pertinent labs positive for BNP of 1113, troponin and COVID-19 negative.  Chest x-ray with borderline cardiomegaly and mild pulmonary edema.  Significant edema of bilateral lower extremities with signs of venous congestion, skin peeling and few shallow ulcers.  Cardiology was consulted.  Subjective: Patient was feeling little better when seen today.  Feeling tired and wants to get some sleep.  Lower extremity pain improving.  Assessment & Plan:   Principal Problem:   Acute CHF (congestive heart failure) (HCC) Active Problems:   COPD exacerbation (HCC)   Diabetes mellitus without complication ([place])   Current tobacco use   Hyperlipidemia   Essential hypertension   Hypothyroidism   Gout   Depression   Tremors of nervous system   Acute respiratory failure with hypoxia (HCC)   Cellulitis of lower extremity   Acute pulmonary edema (HCC)   Elevated brain natriuretic peptide (BNP)  level  Acute hypoxic respiratory failure due to combination of acute on chronic CHF and COPD exacerbation.  Patient continues to have few basal crackles when seen today.  No wheezing today.  She had an echocardiogram done in 2019 in [place] with normal EF, no comment on diastolic dysfunction but she does has LVH.  Normal stress testing done in 2019.  BNP at 1113 and pulmonary edema on chest x-ray. Repeat echocardiogram ordered-normal EF and grade 1 diastolic dysfunction, difficult study. Cardiology was consulted-appreciate their recommendations -Continue with IV Lasix infusion. -Daily weight and BMP. -Pending Reds vest reading. -Strict intake and output.  Proteinuria/nephrotic syndrome.  Patient is being managed by nephrology as an outpatient.  Can be contributing to her lower extremity edema.  Nephrology is following and they start her on Lasix infusion instead of boluses. She will get her kidney biopsy once volume status improves. She was also placed on IV albumin.  COPD exacerbation.  No wheezing today but she continued to require 3 to 4 L of oxygen.  Patient is very heavy smoker with an history of COPD although not using any home oxygen but per daughter she lays in her bed all the time and become short of breath with minor exertions.  She might need oxygen on discharge. -Continue with bronchodilators. -Switch Solu-Medrol with prednisone. -Continue with incentive spirometry. -Continue with supportive care. -Continue with supplemental oxygen to keep the saturation above 90%.  Bilateral lower extremity venous congestion VS cellulitis.  Patient do have mild leukocytosis.  Lower extremities were more consistent with venous congestion but do show some skin peeling and  shallow ulceration without any purulent discharge. Procalcitonin negative.  Blood cultures remain negative.  Venous Doppler negative for DVT.  Wound care was consulted. -Patient will get [employer]. -She is already on  ceftriaxone-continue for UTI and it will also help if there is any element of cellulitis. -Continue with diuresis. -Continue with pain management -PT is recommending SNF placement.  UTI.  Patient was unable to specify UTI symptoms but appears pan positive. UA with pyuria, bacteriuria and positive nitrites. -Urine culture growing E. coli-pending susceptibility. -Continue with ceftriaxone which will help with UTI and possible cellulitis.  Type 2 diabetes mellitus.  A1c of 6.5.  Patient was on Metformin and glipizide at home. -Continue with SSI.  Tobacco abuse.  Patient is a heavy smoker, smokes all day long. Nicotine allergy was listed in her chart. -Ordered nicotine patch at daughter's request as patient was craving a lot for smoking.  Essential hypertension.  Blood pressure mildly elevated but she was also experiencing a lot of pain. -Continue with Cozaar. -Patient is also on IV Lasix. -Continue with as needed hydralazine.  Hypothyroidism. -Continue with home dose of Synthroid  Hyperlipidemia. -Continue with Crestor.  History of gout.  No acute concerns. -Colchicine as needed.  History of depression. -Continue home meds  History of tremors. -Continue home dose of primidone.  Objective: Vitals:   04/05/20 0753 04/05/20 0845 04/05/20 1150 04/05/20 1205  BP: (!) 160/84  (!) 167/81   Pulse: 100  (!) 103   Resp: 16  18   Temp: 98.7 F (37.1 C)  98.2 F (36.8 C)   TempSrc: Oral  Oral   SpO2: 94% 94% 92% 92%  Weight:      Height:        Intake/Output Summary (Last 24 hours) at 04/05/2020 1558 Last data filed at 04/05/2020 1500 Gross per 24 hour  Intake 593.65 ml  Output 3300 ml  Net -2706.35 ml   Filed Weights   04/04/20 0030 04/04/20 0422 04/05/20 0305  Weight: 82.5 kg 82.5 kg 80.2 kg    Examination:  General.  Obese elderly lady, in no acute distress. Pulmonary.  Lungs clear bilaterally, normal respiratory effort. CV.  Regular rate and rhythm, no JVD, rub  or murmur. Abdomen.  Soft, nontender, nondistended, BS positive. CNS.  Alert and oriented x3.  No focal neurologic deficit. Extremities.  2+ LE edema, bilateral lower extremities with [place]na boot. Psychiatry.  Judgment and insight appears normal.  DVT prophylaxis: Lovenox Code Status: Full Family Communication: Daughter was updated at bedside Disposition Plan:  Status is: Inpatient  Remains inpatient appropriate because:Inpatient level of care appropriate due to severity of illness   Dispo: The patient is from: Home              Anticipated d/c is to: SNF              Anticipated d/c date is: 2 days              Patient currently is not medically stable to d/c.    Consultants:   Cardiology  Procedures:  Antimicrobials:  Ceftriaxone  Data Reviewed: I have personally reviewed following labs and imaging studies  CBC: Recent Labs  Lab 03/31/20 0900 04/03/20 1141 04/04/20 0608  WBC 12.7* 10.7* 13.8*  NEUTROABS  --  9.4*  --   HGB 16.9* 17.5* 16.4*  HCT 52.6* 53.7* 51.7*  MCV 88.9 89.6 88.8  PLT 246 296 161   Basic Metabolic Panel: Recent Labs  Lab 04/03/20 1141  04/04/20 0608 04/05/20 0546  NA 132* 134* 135  K 4.5 4.4 4.2  CL 95* 94* 92*  CO2 28 29 32  GLUCOSE 156* 159* 147*  BUN 16 16 17   CREATININE 0.86 0.70 0.71  CALCIUM 8.1* 8.1* 8.1*  MG  --  1.7 2.2   GFR: Estimated Creatinine Clearance: 62.3 mL/min (by C-G formula based on SCr of 0.71 mg/dL). Liver Function Tests: Recent Labs  Lab 04/03/20 1141  AST 19  ALT 11  ALKPHOS 89  BILITOT 0.4  PROT 6.7  ALBUMIN 2.7*   No results for input(s): LIPASE, AMYLASE in the last 168 hours. No results for input(s): AMMONIA in the last 168 hours. Coagulation Profile: Recent Labs  Lab 03/31/20 0900 04/04/20 0608  INR 1.0 1.0   Cardiac Enzymes: No results for input(s): CKTOTAL, CKMB, CKMBINDEX, TROPONINI in the last 168 hours. BNP (last 3 results) No results for input(s): PROBNP in the last 8760  hours. HbA1C: Recent Labs    04/03/20 1410  HGBA1C 6.5*   CBG: Recent Labs  Lab 04/04/20 1201 04/04/20 1649 04/04/20 2032 04/05/20 0755 04/05/20 1152  GLUCAP 199* 123* 263* 170* 193*   Lipid Profile: No results for input(s): CHOL, HDL, LDLCALC, TRIG, CHOLHDL, LDLDIRECT in the last 72 hours. Thyroid Function Tests: No results for input(s): TSH, T4TOTAL, FREET4, T3FREE, THYROIDAB in the last 72 hours. Anemia Panel: No results for input(s): VITAMINB12, FOLATE, FERRITIN, TIBC, IRON, RETICCTPCT in the last 72 hours. Sepsis Labs: Recent Labs  Lab 04/03/20 1141 04/04/20 0608  PROCALCITON <0.10 <0.10    Recent Results (from the past 240 hour(s))  Resp Panel by RT-PCR (Flu A&B, Covid) Nasopharyngeal Swab     Status: None   Collection Time: 04/03/20 11:41 AM   Specimen: Nasopharyngeal Swab; Nasopharyngeal(NP) swabs in vial transport medium  Result Value Ref Range Status   SARS Coronavirus 2 by RT PCR NEGATIVE NEGATIVE Final    Comment: (NOTE) SARS-CoV-2 target nucleic acids are NOT DETECTED.  The SARS-CoV-2 RNA is generally detectable in upper respiratory specimens during the acute phase of infection. The lowest concentration of SARS-CoV-2 viral copies this assay can detect is 138 copies/mL. A negative result does not preclude SARS-Cov-2 infection and should not be used as the sole basis for treatment or other patient management decisions. A negative result may occur with  improper specimen collection/handling, submission of specimen other than nasopharyngeal swab, presence of viral mutation(s) within the areas targeted by this assay, and inadequate number of viral copies(<138 copies/mL). A negative result must be combined with clinical observations, patient history, and epidemiological information. The expected result is Negative.  Fact Sheet for Patients:  EntrepreneurPulse.com.au  Fact Sheet for Healthcare Providers:   IncredibleEmployment.be  This test is no t yet approved or cleared by the [place] FDA and  has been authorized for detection and/or diagnosis of SARS-CoV-2 by FDA under an Emergency Use Authorization (EUA). This EUA will remain  in effect (meaning this test can be used) for the duration of the COVID-19 declaration under Section 564(b)(1) of the Act, 21 U.S.C.section 360bbb-3(b)(1), unless the authorization is terminated  or revoked sooner.       Influenza A by PCR NEGATIVE NEGATIVE Final   Influenza B by PCR NEGATIVE NEGATIVE Final    Comment: (NOTE) The Xpert Xpress SARS-CoV-2/FLU/RSV plus assay is intended as an aid in the diagnosis of influenza from Nasopharyngeal swab specimens and should not be used as a sole basis for treatment. Nasal washings and aspirates are unacceptable  for Xpert Xpress SARS-CoV-2/FLU/RSV testing.  Fact Sheet for Patients: EntrepreneurPulse.com.au  Fact Sheet for Healthcare Providers: IncredibleEmployment.be  This test is not yet approved or cleared by the [place] FDA and has been authorized for detection and/or diagnosis of SARS-CoV-2 by FDA under an Emergency Use Authorization (EUA). This EUA will remain in effect (meaning this test can be used) for the duration of the COVID-19 declaration under Section 564(b)(1) of the Act, 21 U.S.C. section 360bbb-3(b)(1), unless the authorization is terminated or revoked.  Performed at [facility], [address] [place], [place] [address]   Culture, blood (Routine X 2) w Reflex to ID Panel     Status: None (Preliminary result)   Collection Time: 04/03/20  3:27 PM   Specimen: BLOOD  Result Value Ref Range Status   Specimen Description BLOOD BLOOD RIGHT ARM  Final   Special Requests   Final    BOTTLES DRAWN AEROBIC AND ANAEROBIC Blood Culture adequate volume   Culture   Final    NO GROWTH 2 DAYS Performed at [facility], [address], [place], [place] [address]    Report Status PENDING  Incomplete  Urine Culture     Status: Abnormal (Preliminary result)   Collection Time: 04/03/20  4:00 PM   Specimen: Urine, Random  Result Value Ref Range Status   Specimen Description   Final    URINE, RANDOM Performed at [facility], [address], [place], [place] [address]    Special Requests   Final    NONE Performed at [facility], [address] [place], [place] [address]    Culture (A)  Final    >=100,000 COLONIES/mL ESCHERICHIA COLI SUSCEPTIBILITIES TO FOLLOW CULTURE REINCUBATED FOR BETTER GROWTH Performed at [facility], [place] [address], [place], [place] [address]    Report Status PENDING  Incomplete  Culture, blood (Routine X 2) w Reflex to ID Panel     Status: None (Preliminary result)   Collection Time: 04/03/20 10:36 PM   Specimen: BLOOD  Result Value Ref Range Status   Specimen Description BLOOD BLOOD LEFT HAND  Final   Special Requests   Final    BOTTLES DRAWN AEROBIC AND ANAEROBIC Blood Culture adequate volume   Culture   Final    NO GROWTH 2 DAYS Performed at [facility], [address], [place], [place] [address]    Report Status PENDING  Incomplete     Radiology Studies: ECHOCARDIOGRAM COMPLETE  Result Date: 04/04/2020    ECHOCARDIOGRAM REPORT   Patient Name:   Martha [name] Date of Exam: 04/04/2020 Medical Rec #:  [id number]      Height:       64.0 in Accession #:    [id number]     Weight:       181.8 lb Date of Birth:  March 23, 1946     BSA:          1.879 m Patient Age:    55 years       BP:           171/78 mmHg Patient Gender: F              HR:           98 bpm. Exam Location:  [facility] Procedure: 2D Echo, Color Doppler, Cardiac Doppler and Intracardiac            Opacification Agent Indications:     I50.31 CHF-Acute Diastolic  History:  Patient has no prior history of Echocardiogram examinations.                  COPD,  Signs/Symptoms:Shortness of Breath; Risk Factors:Diabetes                  and Current Smoker.  Sonographer:     Charmayne Sheer RDCS (AE) Referring Phys:  Baker Janus Soledad Gerlach NIU Diagnosing Phys: Bartholome Bill MD  Sonographer Comments: Technically difficult study due to poor echo windows. Image acquisition challenging due to patient body habitus and Image acquisition challenging due to COPD. IMPRESSIONS  1. Left ventricular ejection fraction, by estimation, is 55 to 60%. The left ventricle has normal function. The left ventricle has no regional wall motion abnormalities. Left ventricular diastolic parameters are consistent with Grade I diastolic dysfunction (impaired relaxation).  2. Right ventricular systolic function is normal. The right ventricular size is mildly enlarged.  3. Left atrial size was mildly dilated.  4. Right atrial size was mildly dilated.  5. The mitral valve was not well visualized. Trivial mitral valve regurgitation.  6. The aortic valve was not well visualized. Aortic valve regurgitation is trivial. FINDINGS  Left Ventricle: Left ventricular ejection fraction, by estimation, is 55 to 60%. The left ventricle has normal function. The left ventricle has no regional wall motion abnormalities. Definity contrast agent was given IV to delineate the left ventricular  endocardial borders. The left ventricular internal cavity size was normal in size. There is borderline left ventricular hypertrophy. Left ventricular diastolic parameters are consistent with Grade I diastolic dysfunction (impaired relaxation). Right Ventricle: The right ventricular size is mildly enlarged. No increase in right ventricular wall thickness. Right ventricular systolic function is normal. Left Atrium: Left atrial size was mildly dilated. Right Atrium: Right atrial size was mildly dilated. Pericardium: There is no evidence of pericardial effusion. Mitral Valve: The mitral valve was not well visualized. Trivial mitral valve regurgitation. MV  peak gradient, 7.4 mmHg. The mean mitral valve gradient is 3.0 mmHg. Tricuspid Valve: The tricuspid valve is not well visualized. Tricuspid valve regurgitation is trivial. Aortic Valve: The aortic valve was not well visualized. Aortic valve regurgitation is trivial. Aortic valve mean gradient measures 3.0 mmHg. Aortic valve peak gradient measures 6.8 mmHg. Aortic valve area, by VTI measures 2.46 cm. Pulmonic Valve: The pulmonic valve was not well visualized. Pulmonic valve regurgitation is not visualized. Aorta: The aortic root was not well visualized. IAS/Shunts: The interatrial septum was not assessed.  LEFT VENTRICLE PLAX 2D LVIDd:         4.15 cm     Diastology LVIDs:         3.64 cm     LV e' medial:    5.98 cm/s LV PW:         1.43 cm     LV E/e' medial:  18.3 LV IVS:        1.12 cm     LV e' lateral:   6.53 cm/s LVOT diam:     2.00 cm     LV E/e' lateral: 16.8 LV SV:         55 LV SV Index:   29 LVOT Area:     3.14 cm  LV Volumes (MOD) LV vol d, MOD A2C: 55.5 ml LV vol d, MOD A4C: 63.8 ml LV vol s, MOD A2C: 34.3 ml LV vol s, MOD A4C: 23.4 ml LV SV MOD A2C:     21.2 ml LV SV MOD A4C:  63.8 ml LV SV MOD BP:      34.2 ml RIGHT VENTRICLE RV Basal diam:  3.76 cm LEFT ATRIUM             Index       RIGHT ATRIUM           Index LA diam:        4.90 cm 2.61 cm/m  RA Area:     19.80 cm LA Vol (A2C):   59.5 ml 31.67 ml/m RA Volume:   61.10 ml  32.52 ml/m LA Vol (A4C):   57.5 ml 30.61 ml/m LA Biplane Vol: 61.2 ml 32.58 ml/m  AORTIC VALVE                   PULMONIC VALVE AV Area (Vmax):    2.51 cm    PV Vmax:       0.76 m/s AV Area (Vmean):   2.43 cm    PV Vmean:      53.200 cm/s AV Area (VTI):     2.46 cm    PV VTI:        0.126 m AV Vmax:           130.00 cm/s PV Peak grad:  2.3 mmHg AV Vmean:          83.500 cm/s PV Mean grad:  1.0 mmHg AV VTI:            0.225 m AV Peak Grad:      6.8 mmHg AV Mean Grad:      3.0 mmHg LVOT Vmax:         104.00 cm/s LVOT Vmean:        64.700 cm/s LVOT VTI:          0.176  m LVOT/AV VTI ratio: 0.78  AORTA Ao Root diam: 3.10 cm MITRAL VALVE                TRICUSPID VALVE MV Area (PHT): 3.00 cm     TR Peak grad:   28.9 mmHg MV Peak grad:  7.4 mmHg     TR Vmax:        269.00 cm/s MV Mean grad:  3.0 mmHg MV Vmax:       1.36 m/s     SHUNTS MV Vmean:      86.9 cm/s    Systemic VTI:  0.18 m MV Decel Time: 253 msec     Systemic Diam: 2.00 cm MV E velocity: 109.50 cm/s MV A velocity: 137.50 cm/s MV E/A ratio:  0.80 Bartholome Bill MD Electronically signed by Bartholome Bill MD Signature Date/Time: 04/04/2020/4:54:02 PM    Final     Scheduled Meds: . vitamin C  1,000 mg Oral Daily  . aspirin EC  81 mg Oral Daily  . enoxaparin (LOVENOX) injection  40 mg Subcutaneous Q24H  . gabapentin  600 mg Oral TID  . insulin aspart  0-5 Units Subcutaneous QHS  . insulin aspart  0-9 Units Subcutaneous TID WC  . ipratropium-albuterol  3 mL Nebulization Q4H  . levothyroxine  150 mcg Oral Q0600  . loratadine  10 mg Oral Daily  . losartan  50 mg Oral Daily  . methylPREDNISolone (SOLU-MEDROL) injection  40 mg Intravenous Q12H  . nicotine  21 mg Transdermal Daily  . primidone  50 mg Oral BID  . rOPINIRole  0.5 mg Oral QHS  . rosuvastatin  5 mg Oral Daily  . sertraline  50 mg Oral Daily  .  sodium chloride flush  3 mL Intravenous Q12H  . vitamin B-12  1,000 mcg Oral Daily   Continuous Infusions: . sodium chloride    . albumin human 25 g (04/05/20 1312)  . cefTRIAXone (ROCEPHIN)  IV 2 g (04/05/20 0825)  . furosemide (LASIX) 200 mg in dextrose 5% 100 mL (2mg /mL) infusion 6 mg/hr (04/04/20 1429)     LOS: 2 days   Time spent: 35 minutes  Lorella Nimrod, MD [facility]  If 7PM-7AM, please contact night-coverage [email or web address]  04/05/2020, 3:58 PM   This record has been created using Systems analyst. Errors have been sought and corrected,but may not always be located. Such creation errors do not reflect on the standard of care.

## 2020-04-05 NOTE — Progress Notes (Signed)
[facility]  ROUNDING NOTE   Subjective:  Patient known to [place] from the office. She was recently noted as having significant edema along with proteinuria of at least 17 g. She was seen in the hospital today as she was unable to have renal biopsy performed given her underlying edema status as well as shortness of breath. Pure wick is in place and urine canister shows a significant amount of foam on top of her urine indicating significant proteinuria.   Update Patient diuresing well on furosemide infusion.  She denies worsening shortness of breath, nausea or vomiting.  Urine output for the preceding 24 hours is 2650 mL .   Objective:  Vital signs in last 24 hours:  Temp:  [97.9 F (36.6 C)-98.7 F (37.1 C)] 98.2 F (36.8 C) (12/07 1150) Pulse Rate:  [85-103] 103 (12/07 1150) Resp:  [16-20] 18 (12/07 1150) BP: (92-167)/(55-84) 167/81 (12/07 1150) SpO2:  [91 %-95 %] 92 % (12/07 1205) Weight:  [80.2 kg] 80.2 kg (12/07 0305)  Weight change: 8.196 kg Filed Weights   04/04/20 0030 04/04/20 0422 04/05/20 0305  Weight: 82.5 kg 82.5 kg 80.2 kg    Intake/Output: I/O last 3 completed shifts: In: 596.7 [P.O.:480; I.V.:43.4; IV Piggyback:73.3] Out: 4350 [Urine:4350]   Intake/Output this shift:  Total I/O In: 480 [P.O.:480] Out: 1100 [Urine:1100]  Physical Exam: General:  Sitting up in bed, in no acute distress  Head:  Moist oral mucosal membranes  Eyes:  Sclera and conjunctiva clear  Lungs:   Respirations symmetrical, unlabored, lungs with crackles bilaterally,On 6L O2  Heart:  Regular rate and rhythm  Abdomen:   Soft, nontender, bowel sounds present  Extremities:  1+ peripheral edema  Neurologic:  Able to answer simple questions appropriately  Skin:  No acute lesions or rashes    Basic Metabolic Panel: Recent Labs  Lab 04/03/20 1141 04/04/20 0608 04/05/20 0546  NA 132* 134* 135  K 4.5 4.4 4.2  CL 95* 94* 92*  CO2 28 29 32  GLUCOSE 156* 159* 147*  BUN 16  16 17   CREATININE 0.86 0.70 0.71  CALCIUM 8.1* 8.1* 8.1*  MG  --  1.7 2.2    Liver Function Tests: Recent Labs  Lab 04/03/20 1141  AST 19  ALT 11  ALKPHOS 89  BILITOT 0.4  PROT 6.7  ALBUMIN 2.7*   No results for input(s): LIPASE, AMYLASE in the last 168 hours. No results for input(s): AMMONIA in the last 168 hours.  CBC: Recent Labs  Lab 03/31/20 0900 04/03/20 1141 04/04/20 0608  WBC 12.7* 10.7* 13.8*  NEUTROABS  --  9.4*  --   HGB 16.9* 17.5* 16.4*  HCT 52.6* 53.7* 51.7*  MCV 88.9 89.6 88.8  PLT 246 296 236    Cardiac Enzymes: No results for input(s): CKTOTAL, CKMB, CKMBINDEX, TROPONINI in the last 168 hours.  BNP: Invalid input(s): POCBNP  CBG: Recent Labs  Lab 04/04/20 1201 04/04/20 1649 04/04/20 2032 04/05/20 0755 04/05/20 1152  GLUCAP 199* 123* 263* 170* 193*    Microbiology: Results for orders placed or performed during the hospital encounter of 04/03/20  Resp Panel by RT-PCR (Flu A&B, Covid) Nasopharyngeal Swab     Status: None   Collection Time: 04/03/20 11:41 AM   Specimen: Nasopharyngeal Swab; Nasopharyngeal(NP) swabs in vial transport medium  Result Value Ref Range Status   SARS Coronavirus 2 by RT PCR NEGATIVE NEGATIVE Final    Comment: (NOTE) SARS-CoV-2 target nucleic acids are NOT DETECTED.  The SARS-CoV-2 RNA  is generally detectable in upper respiratory specimens during the acute phase of infection. The lowest concentration of SARS-CoV-2 viral copies this assay can detect is 138 copies/mL. A negative result does not preclude SARS-Cov-2 infection and should not be used as the sole basis for treatment or other patient management decisions. A negative result may occur with  improper specimen collection/handling, submission of specimen other than nasopharyngeal swab, presence of viral mutation(s) within the areas targeted by this assay, and inadequate number of viral copies(<138 copies/mL). A negative result must be combined  with clinical observations, patient history, and epidemiological information. The expected result is Negative.  Fact Sheet for Patients:  EntrepreneurPulse.com.au  Fact Sheet for Healthcare Providers:  IncredibleEmployment.be  This test is no t yet approved or cleared by the [place] FDA and  has been authorized for detection and/or diagnosis of SARS-CoV-2 by FDA under an Emergency Use Authorization (EUA). This EUA will remain  in effect (meaning this test can be used) for the duration of the COVID-19 declaration under Section 564(b)(1) of the Act, 21 U.S.C.section 360bbb-3(b)(1), unless the authorization is terminated  or revoked sooner.       Influenza A by PCR NEGATIVE NEGATIVE Final   Influenza B by PCR NEGATIVE NEGATIVE Final    Comment: (NOTE) The Xpert Xpress SARS-CoV-2/FLU/RSV plus assay is intended as an aid in the diagnosis of influenza from Nasopharyngeal swab specimens and should not be used as a sole basis for treatment. Nasal washings and aspirates are unacceptable for Xpert Xpress SARS-CoV-2/FLU/RSV testing.  Fact Sheet for Patients: EntrepreneurPulse.com.au  Fact Sheet for Healthcare Providers: IncredibleEmployment.be  This test is not yet approved or cleared by the [place] FDA and has been authorized for detection and/or diagnosis of SARS-CoV-2 by FDA under an Emergency Use Authorization (EUA). This EUA will remain in effect (meaning this test can be used) for the duration of the COVID-19 declaration under Section 564(b)(1) of the Act, 21 U.S.C. section 360bbb-3(b)(1), unless the authorization is terminated or revoked.  Performed at [facility], [address] [place], [place] [address]   Culture, blood (Routine X 2) w Reflex to ID Panel     Status: None (Preliminary result)   Collection Time: 04/03/20  3:27 PM   Specimen: BLOOD  Result Value Ref Range Status    Specimen Description BLOOD BLOOD RIGHT ARM  Final   Special Requests   Final    BOTTLES DRAWN AEROBIC AND ANAEROBIC Blood Culture adequate volume   Culture   Final    NO GROWTH 2 DAYS Performed at [facility], [address] [place], [place] [address]    Report Status PENDING  Incomplete  Urine Culture     Status: Abnormal (Preliminary result)   Collection Time: 04/03/20  4:00 PM   Specimen: Urine, Random  Result Value Ref Range Status   Specimen Description   Final    URINE, RANDOM Performed at [facility], [address] [place], [place] [address]    Special Requests   Final    NONE Performed at [facility], [address], [place], [place] [address]    Culture (A)  Final    >=100,000 COLONIES/mL GRAM NEGATIVE RODS IDENTIFICATION AND SUSCEPTIBILITIES TO FOLLOW Performed at [facility], [place] [address] [place],  [address]    Report Status PENDING  Incomplete  Culture, blood (Routine X 2) w Reflex to ID Panel     Status: None (Preliminary result)   Collection Time: 04/03/20  10:36 PM   Specimen: BLOOD  Result Value Ref Range Status   Specimen Description BLOOD BLOOD LEFT HAND  Final   Special Requests   Final    BOTTLES DRAWN AEROBIC AND ANAEROBIC Blood Culture adequate volume   Culture   Final    NO GROWTH 2 DAYS Performed at [facility], [address] [place], [place] [address]    Report Status PENDING  Incomplete    Coagulation Studies: Recent Labs    04/04/20 0608  LABPROT 12.6  INR 1.0    Urinalysis: Recent Labs    04/03/20 1600  COLORURINE YELLOW*  LABSPEC 1.010  PHURINE 6.0  GLUCOSEU NEGATIVE  HGBUR SMALL*  BILIRUBINUR NEGATIVE  KETONESUR NEGATIVE  PROTEINUR 100*  NITRITE POSITIVE*  LEUKOCYTESUR SMALL*      Imaging: US Venous Img Lower Bilateral (DVT)  Result Date: 04/03/2020 CLINICAL DATA:  Lower leg cellulitis EXAM: BILATERAL LOWER EXTREMITY VENOUS DOPPLER ULTRASOUND TECHNIQUE:  Gray-scale sonography with graded compression, as well as color Doppler and duplex ultrasound were performed to evaluate the lower extremity deep venous systems from the level of the common femoral vein and including the common femoral, femoral, profunda femoral, popliteal and calf veins including the posterior tibial, peroneal and gastrocnemius veins when visible. The superficial great saphenous vein was also interrogated. Spectral Doppler was utilized to evaluate flow at rest and with distal augmentation maneuvers in the common femoral, femoral and popliteal veins. COMPARISON:  None. FINDINGS: RIGHT LOWER EXTREMITY Common Femoral Vein: No evidence of thrombus. Normal compressibility, respiratory phasicity and response to augmentation. Saphenofemoral Junction: No evidence of thrombus. Normal compressibility and flow on color Doppler imaging. Profunda Femoral Vein: No evidence of thrombus. Normal compressibility and flow on color Doppler imaging. Femoral Vein: No evidence of thrombus. Normal compressibility, respiratory phasicity and response to augmentation. Popliteal Vein: Not well visualized due to overlying edema. Calf Veins: Not well visualized due to overlying edema. Superficial Great Saphenous Vein: No evidence of thrombus. Normal compressibility. Venous Reflux:  None. Other Findings:  None. LEFT LOWER EXTREMITY Common Femoral Vein: No evidence of thrombus. Normal compressibility, respiratory phasicity and response to augmentation. Saphenofemoral Junction: No evidence of thrombus. Normal compressibility and flow on color Doppler imaging. Profunda Femoral Vein: No evidence of thrombus. Normal compressibility and flow on color Doppler imaging. Femoral Vein: No evidence of thrombus. Normal compressibility, respiratory phasicity and response to augmentation. Popliteal Vein: Not well visualized due to overlying edema. Calf Veins: Not well visualized due to overlying edema. Superficial Great Saphenous Vein: No  evidence of thrombus. Normal compressibility. Venous Reflux:  None. Other Findings:  None. IMPRESSION: Somewhat limited exam due to peripheral edema although no central deep venous thrombosis is noted. Electronically Signed   By: Inez Catalina M.D.   On: 04/03/2020 15:58   ECHOCARDIOGRAM COMPLETE  Result Date: 04/04/2020    ECHOCARDIOGRAM REPORT   Patient Name:   Martha [name] Date of Exam: 04/04/2020 Medical Rec #:  [id number]      Height:       64.0 in Accession #:    [id number]     Weight:       181.8 lb Date of Birth:  1946/03/29     BSA:          1.879 m Patient Age:    74 years       BP:           171/78 mmHg Patient Gender: F  HR:           98 bpm. Exam Location:  [facility] Procedure: 2D Echo, Color Doppler, Cardiac Doppler and Intracardiac            Opacification Agent Indications:     I50.31 CHF-Acute Diastolic  History:         Patient has no prior history of Echocardiogram examinations.                  COPD, Signs/Symptoms:Shortness of Breath; Risk Factors:Diabetes                  and Current Smoker.  Sonographer:     Charmayne Sheer RDCS (AE) Referring Phys:  Baker Janus Soledad Gerlach NIU Diagnosing Phys: Bartholome Bill MD  Sonographer Comments: Technically difficult study due to poor echo windows. Image acquisition challenging due to patient body habitus and Image acquisition challenging due to COPD. IMPRESSIONS  1. Left ventricular ejection fraction, by estimation, is 55 to 60%. The left ventricle has normal function. The left ventricle has no regional wall motion abnormalities. Left ventricular diastolic parameters are consistent with Grade I diastolic dysfunction (impaired relaxation).  2. Right ventricular systolic function is normal. The right ventricular size is mildly enlarged.  3. Left atrial size was mildly dilated.  4. Right atrial size was mildly dilated.  5. The mitral valve was not well visualized. Trivial mitral valve regurgitation.  6. The aortic valve was not well visualized. Aortic valve  regurgitation is trivial. FINDINGS  Left Ventricle: Left ventricular ejection fraction, by estimation, is 55 to 60%. The left ventricle has normal function. The left ventricle has no regional wall motion abnormalities. Definity contrast agent was given IV to delineate the left ventricular  endocardial borders. The left ventricular internal cavity size was normal in size. There is borderline left ventricular hypertrophy. Left ventricular diastolic parameters are consistent with Grade I diastolic dysfunction (impaired relaxation). Right Ventricle: The right ventricular size is mildly enlarged. No increase in right ventricular wall thickness. Right ventricular systolic function is normal. Left Atrium: Left atrial size was mildly dilated. Right Atrium: Right atrial size was mildly dilated. Pericardium: There is no evidence of pericardial effusion. Mitral Valve: The mitral valve was not well visualized. Trivial mitral valve regurgitation. MV peak gradient, 7.4 mmHg. The mean mitral valve gradient is 3.0 mmHg. Tricuspid Valve: The tricuspid valve is not well visualized. Tricuspid valve regurgitation is trivial. Aortic Valve: The aortic valve was not well visualized. Aortic valve regurgitation is trivial. Aortic valve mean gradient measures 3.0 mmHg. Aortic valve peak gradient measures 6.8 mmHg. Aortic valve area, by VTI measures 2.46 cm. Pulmonic Valve: The pulmonic valve was not well visualized. Pulmonic valve regurgitation is not visualized. Aorta: The aortic root was not well visualized. IAS/Shunts: The interatrial septum was not assessed.  LEFT VENTRICLE PLAX 2D LVIDd:         4.15 cm     Diastology LVIDs:         3.64 cm     LV e' medial:    5.98 cm/s LV PW:         1.43 cm     LV E/e' medial:  18.3 LV IVS:        1.12 cm     LV e' lateral:   6.53 cm/s LVOT diam:     2.00 cm     LV E/e' lateral: 16.8 LV SV:         55 LV SV Index:   29 LVOT  Area:     3.14 cm  LV Volumes (MOD) LV vol d, MOD A2C: 55.5 ml LV vol d,  MOD A4C: 63.8 ml LV vol s, MOD A2C: 34.3 ml LV vol s, MOD A4C: 23.4 ml LV SV MOD A2C:     21.2 ml LV SV MOD A4C:     63.8 ml LV SV MOD BP:      34.2 ml RIGHT VENTRICLE RV Basal diam:  3.76 cm LEFT ATRIUM             Index       RIGHT ATRIUM           Index LA diam:        4.90 cm 2.61 cm/m  RA Area:     19.80 cm LA Vol (A2C):   59.5 ml 31.67 ml/m RA Volume:   61.10 ml  32.52 ml/m LA Vol (A4C):   57.5 ml 30.61 ml/m LA Biplane Vol: 61.2 ml 32.58 ml/m  AORTIC VALVE                   PULMONIC VALVE AV Area (Vmax):    2.51 cm    PV Vmax:       0.76 m/s AV Area (Vmean):   2.43 cm    PV Vmean:      53.200 cm/s AV Area (VTI):     2.46 cm    PV VTI:        0.126 m AV Vmax:           130.00 cm/s PV Peak grad:  2.3 mmHg AV Vmean:          83.500 cm/s PV Mean grad:  1.0 mmHg AV VTI:            0.225 m AV Peak Grad:      6.8 mmHg AV Mean Grad:      3.0 mmHg LVOT Vmax:         104.00 cm/s LVOT Vmean:        64.700 cm/s LVOT VTI:          0.176 m LVOT/AV VTI ratio: 0.78  AORTA Ao Root diam: 3.10 cm MITRAL VALVE                TRICUSPID VALVE MV Area (PHT): 3.00 cm     TR Peak grad:   28.9 mmHg MV Peak grad:  7.4 mmHg     TR Vmax:        269.00 cm/s MV Mean grad:  3.0 mmHg MV Vmax:       1.36 m/s     SHUNTS MV Vmean:      86.9 cm/s    Systemic VTI:  0.18 m MV Decel Time: 253 msec     Systemic Diam: 2.00 cm MV E velocity: 109.50 cm/s MV A velocity: 137.50 cm/s MV E/A ratio:  0.80 Bartholome Bill MD Electronically signed by Bartholome Bill MD Signature Date/Time: 04/04/2020/4:54:02 PM    Final      Medications:   . sodium chloride    . albumin human 25 g (04/05/20 1312)  . cefTRIAXone (ROCEPHIN)  IV 2 g (04/05/20 0825)  . furosemide (LASIX) 200 mg in dextrose 5% 100 mL (2mg /mL) infusion 6 mg/hr (04/04/20 1429)   . vitamin C  1,000 mg Oral Daily  . aspirin EC  81 mg Oral Daily  . enoxaparin (LOVENOX) injection  40 mg Subcutaneous Q24H  . gabapentin  600 mg Oral TID  .  insulin aspart  0-5 Units Subcutaneous QHS  .  insulin aspart  0-9 Units Subcutaneous TID WC  . ipratropium-albuterol  3 mL Nebulization Q4H  . levothyroxine  150 mcg Oral Q0600  . loratadine  10 mg Oral Daily  . losartan  50 mg Oral Daily  . methylPREDNISolone (SOLU-MEDROL) injection  40 mg Intravenous Q12H  . nicotine  21 mg Transdermal Daily  . primidone  50 mg Oral BID  . rOPINIRole  0.5 mg Oral QHS  . rosuvastatin  5 mg Oral Daily  . sertraline  50 mg Oral Daily  . sodium chloride flush  3 mL Intravenous Q12H  . vitamin B-12  1,000 mcg Oral Daily   sodium chloride, acetaminophen, albuterol, colchicine, dextromethorphan-guaiFENesin, fentaNYL (SUBLIMAZE) injection, fluticasone, hydrALAZINE, ondansetron (ZOFRAN) IV, oxyCODONE-acetaminophen, sodium chloride flush, tiZANidine  Assessment/ Plan:  74 y.o. female with past medical history of COPD, anxiety, hypertension, longstanding diabetes mellitus type 2, peripheral vascular disease, longstanding tobacco abuse who has nephrotic range proteinuria of 17 g.  #Proteinuria, 17 g.   # Nephrotic syndrome.   Planning for biopsy when the patient is able to tolerate lying flat  #Volume overload.   Treating with furosemide IV infusion 6 mg/h Good diuresis noted with 2650 mL of urine for the preceding 24 hours Peripheral edema improving We will continue furosemide infusion at the same rate  #Hypertension Blood pressure readings above goal We will continue losartan for the management of hypertension and proteinuria   LOS: 2 Faraz Ponciano 12/7/20212:34 PM

## 2020-04-05 NOTE — Progress Notes (Signed)
Physical Therapy Treatment Patient Details Name: Martha [name] MRN: [id number] DOB: October 21, 1945 Today's Date: 04/05/2020    History of Present Illness HPI: Martha [name] is a 74 y.o. female with medical history significant of  HTN, HLD, DM, COPD, tobacco abuse, hypothyroidism, gout, depression, tremor, proteinuria, depression, anxiety, GERD, diverticulitis, who presents with bilateral leg edema and pain, and SOB.  Pt with very painful b/l LE wounds (R>L) that limit abiltiy to participate with PT.      PT Comments    Pt in bed upon arrival, boyfriend at bedside.  Pt remains on 6L O2 via n/c 92% at rest.  Pt transferred supine to sit at edge of bed with ModA, use of side rail, and HOB raised.  Pt tolerated EOB with B UE support x 30 seconds prior to c/o increased fatigue and discomfort in B LE's. Pt required ModA to return to supine and A of 2 to boost up in bed and position to facilitate fluid return.  Pt educated on elevating B LE's as much as possible and benefits of ROM and ankle pumps to facilitate increased circulation.  Pt currently with decreased tolerance for activity due to increased fluid retention, O2 needs, poor activity tolerance, and multiple comorbidities. Pt however willing to participate and will continue to benefit from transitioning to SNF once medically appropriate.  Follow Up Recommendations  SNF     Equipment Recommendations  None recommended by PT    Recommendations for Other Services       Precautions / Restrictions Precautions Precautions: Fall Restrictions Weight Bearing Restrictions: No Other Position/Activity Restrictions: b/l LEs very swollen, painful, open wounds, weeping, wrapped in coban, elevated    Mobility  Bed Mobility Overal bed mobility: Needs Assistance Bed Mobility: Supine to Sit;Sit to Supine     Supine to sit: Mod assist Sit to supine: Max assist   General bed mobility comments: +2 assist for repositioning in bed, difficulty following  commands  Transfers                 General transfer comment: pt refused  Ambulation/Gait                 Stairs             Wheelchair Mobility    Modified Rankin (Stroke Patients Only)       Balance Overall balance assessment: Needs assistance Sitting-balance support: Bilateral upper extremity supported Sitting balance-Leahy Scale: Fair Sitting balance - Comments:  (Poor tolerance for sitting, only tolerating 30 seconds) Postural control: Left lateral lean                                  Cognition Arousal/Alertness: Lethargic Behavior During Therapy: WFL for tasks assessed/performed Overall Cognitive Status: Within Functional Limits for tasks assessed                                 General Comments: Pt sleepy, cues to improve alertness, oriented x4 but has difficulty with problem solving, thought her boyfriend was just outside the room but he wasn't, at times attempting to remove her nasal cannula, RN notified      Exercises General Exercises - Lower Extremity Ankle Circles/Pumps: AROM;Supine;Both    General Comments General comments (skin integrity, edema, etc.): RN notified of BLE seeping, pt confusion      Pertinent Vitals/Pain Pain  Assessment: 0-10 Pain Score: 4  Pain Location: b/l LEs, pain increases with any/all movement Pain Descriptors / Indicators: Jabbing;Tightness Pain Intervention(s): Limited activity within patient's tolerance;Repositioned;Patient requesting pain meds-RN notified    Home Living Family/patient expects to be discharged to:: Skilled nursing facility Living Arrangements: Spouse/significant other   Type of Home: House       Home Equipment: Walker - 4 wheels;Bedside commode;Cane - single point;Shower seat Additional Comments: has a walk in shower    Prior Function Level of Independence: Needs assistance  Gait / Transfers Assistance Needed: Pt has been less and less able to get  around in the home recently, wound care/una boots for the last few months ADL's / Homemaking Assistance Needed: pt reports her boyfriend has to assist with bathing, dressing     PT Goals (current goals can now be found in the care plan section) Acute Rehab PT Goals Patient Stated Goal: Get some pain medicine    Frequency    Min 2X/week      PT Plan      Co-evaluation              AM-PAC PT "6 Clicks" Mobility   Outcome Measure  Help needed turning from your back to your side while in a flat bed without using bedrails?: A Little Help needed moving from lying on your back to sitting on the side of a flat bed without using bedrails?: A Lot Help needed moving to and from a bed to a chair (including a wheelchair)?: A Lot Help needed standing up from a chair using your arms (e.g., wheelchair or bedside chair)?: Total Help needed to walk in hospital room?: Total Help needed climbing 3-5 steps with a railing? : Total 6 Click Score: 10    End of Session Equipment Utilized During Treatment: Oxygen (6 Liters n/c, 92% at rest) Activity Tolerance: Patient limited by lethargy;Patient limited by pain Patient left: in bed;with call bell/phone within reach;with bed alarm set;with family/visitor present Nurse Communication: Mobility status PT Visit Diagnosis: Muscle weakness (generalized) (M62.81);Difficulty in walking, not elsewhere classified (R26.2)     Time: 4142-3953 PT Time Calculation (min) (ACUTE ONLY): 24 min  Charges:  $Therapeutic Activity: 23-37 mins                    Mikel Cella, PTA   Josie Dixon 04/05/2020, 1:27 PM

## 2020-04-05 NOTE — Evaluation (Signed)
Occupational Therapy Evaluation Patient Details Name: Martha [name] MRN: [id number] DOB: December 24, 1945 Today's Date: 04/05/2020    History of Present Illness HPI: Martha [name] is a 74 y.o. female with medical history significant of  HTN, HLD, DM, COPD, tobacco abuse, hypothyroidism, gout, depression, tremor, proteinuria, depression, anxiety, GERD, diverticulitis, who presents with bilateral leg edema and pain, and SOB.  Pt with very painful b/l LE wounds (R>L) that limit abiltiy to participate with PT.     Clinical Impression   Pt was seen for OT evaluation this date. Pt fatigued, requiring cues to maintain alertness. Prior to hospital admission, pt reports requiring assist from boyfriend for ADL tasks and using a rollator for mobility. Currently pt demonstrates impairments as described below (See OT problem list) which functionally limit her ability to perform ADL/self-care tasks. Pt currently requires Max assist for LB ADL, Mod assist for UB ADL, and +2 for bed mobility. BLE wrapped in coban, seeping. Nurse tech assisted in placing chuck pads underneath BLE, physical assist required for leg lifts as pt too weak to perform on her own. Pt instructed in bed mobility to assist with repositioning, ultimately requiring +2 max assist to perform. Pt educated in optimal positioning for comfort, skin protection, and lungs function. Pt would benefit from skilled OT services to address noted impairments and functional limitations (see below for any additional details) in order to maximize safety and independence while minimizing falls risk and caregiver burden. Upon hospital discharge, recommend STR to maximize pt safety and return to PLOF.     Follow Up Recommendations  SNF    Equipment Recommendations  3 in 1 bedside commode    Recommendations for Other Services       Precautions / Restrictions Precautions Precautions: Fall Restrictions Weight Bearing Restrictions: No Other Position/Activity  Restrictions: b/l LEs very swollen, painful, open wounds, weeping, wrapped in coban      Mobility Bed Mobility Overal bed mobility: Needs Assistance             General bed mobility comments: +2 assist for repositioning in bed, difficulty following commands    Transfers                 General transfer comment: pt refused    Balance                                           ADL either performed or assessed with clinical judgement   ADL Overall ADL's : Needs assistance/impaired                                       General ADL Comments: Max A for LB ADL, Mod A for UB ADL, Min A grooming, set up for self feeding     Vision Baseline Vision/History: Wears glasses Wears Glasses: At all times Patient Visual Report: No change from baseline       Perception     Praxis      Pertinent Vitals/Pain Pain Assessment: 0-10 Pain Score: 8  Pain Location: b/l LEs, pain increases with any/all movement Pain Intervention(s): Limited activity within patient's tolerance;Monitored during session;Repositioned     Hand Dominance Right   Extremity/Trunk Assessment Upper Extremity Assessment Upper Extremity Assessment: Generalized weakness   Lower Extremity Assessment Lower Extremity Assessment: Generalized weakness (+  2-3 pitting edema in BLE, BLE wrapped in coban)       Communication Communication Communication: No difficulties   Cognition Arousal/Alertness: Lethargic Behavior During Therapy: WFL for tasks assessed/performed Overall Cognitive Status: No family/caregiver present to determine baseline cognitive functioning                                 General Comments: Pt sleepy, cues to improve alertness, oriented x4 but has difficulty with problem solving, thought her boyfriend was just outside the room but he wasn't, at times attempting to remove her nasal cannula, RN notified   General Comments  RN notified of BLE  seeping, pt confusion    Exercises     Shoulder Instructions      Home Living Family/patient expects to be discharged to:: Skilled nursing facility Living Arrangements: Spouse/significant other   Type of Home: [place]: [place] - 4 wheels;Bedside commode;Cane - single point;Shower seat   Additional Comments: has a walk in shower      Prior Functioning/Environment Level of Independence: Needs assistance  Gait / Transfers Assistance Needed: Pt has been less and less able to get around in the home recently, wound care/una boots for the last few months ADL's / Homemaking Assistance Needed: pt reports her boyfriend has to assist with bathing, dressing            OT Problem List: Decreased strength;Decreased cognition;Pain;Increased edema;Decreased activity tolerance;Impaired balance (sitting and/or standing);Decreased knowledge of use of DME or AE;Obesity      OT Treatment/Interventions: Self-care/ADL training;Therapeutic exercise;Therapeutic activities;DME and/or AE instruction;Patient/family education;Balance training;Energy conservation    OT Goals(Current goals can be found in the care plan section) Acute Rehab OT Goals Patient Stated Goal: get some sleep and get better OT Goal Formulation: With patient Time For Goal Achievement: 04/19/20 Potential to Achieve Goals: Good ADL Goals Pt Will Perform Lower Body Dressing: with mod assist;sitting/lateral leans Pt Will Transfer to Toilet: with min assist;with mod assist;bedside commode;stand pivot transfer Additional ADL Goal #1: Pt will verbalize plan to implement at least 2 learned energy conservation/falls prevention strategies to maximize safety with ADL Additional ADL Goal #2: Pt will utilize learned PLB during ADL tasks with Min verbal cues to initiate.  OT Frequency: Min 1X/week   Barriers to D/C:            Co-evaluation              AM-PAC OT "6 Clicks" Daily  Activity     Outcome Measure Help from another person eating meals?: None Help from another person taking care of personal grooming?: A Little Help from another person toileting, which includes using toliet, bedpan, or urinal?: A Lot Help from another person bathing (including washing, rinsing, drying)?: A Lot Help from another person to put on and taking off regular upper body clothing?: A Lot Help from another person to put on and taking off regular lower body clothing?: A Lot 6 Click Score: 15   End of Session Nurse Communication: Other (comment) (confusion)  Activity Tolerance: Patient tolerated treatment well;Other (comment) (fatigued, mild confusion) Patient left: in bed;with call bell/phone within reach;with bed alarm set  OT Visit Diagnosis: Other abnormalities of gait and mobility (R26.89);Muscle weakness (generalized) (M62.81)  Time: 9780-2089 OT Time Calculation (min): 17 min Charges:  OT General Charges $OT Visit: 1 Visit OT Evaluation $OT Eval Moderate Complexity: 1 Mod OT Treatments $Self Care/Home Management : 8-22 mins  Jeni Salles, MPH, MS, OTR/L ascom [phone] 04/05/20, 10:48 AM

## 2020-04-06 ENCOUNTER — Encounter: Payer: Self-pay | Admitting: Internal Medicine

## 2020-04-06 ENCOUNTER — Inpatient Hospital Stay: Payer: Medicare Other

## 2020-04-06 LAB — ANA W/REFLEX IF POSITIVE: Anti Nuclear Antibody (ANA): NEGATIVE

## 2020-04-06 LAB — PROTEIN ELECTROPHORESIS, SERUM
A/G Ratio: 0.9 (ref 0.7–1.7)
Albumin ELP: 3 g/dL (ref 2.9–4.4)
Alpha-1-Globulin: 0.3 g/dL (ref 0.0–0.4)
Alpha-2-Globulin: 1 g/dL (ref 0.4–1.0)
Beta Globulin: 1 g/dL (ref 0.7–1.3)
Gamma Globulin: 0.9 g/dL (ref 0.4–1.8)
Globulin, Total: 3.3 g/dL (ref 2.2–3.9)
Total Protein ELP: 6.3 g/dL (ref 6.0–8.5)

## 2020-04-06 LAB — BASIC METABOLIC PANEL
Anion gap: 12 (ref 5–15)
BUN: 17 mg/dL (ref 8–23)
CO2: 36 mmol/L — ABNORMAL HIGH (ref 22–32)
Calcium: 8.3 mg/dL — ABNORMAL LOW (ref 8.9–10.3)
Chloride: 86 mmol/L — ABNORMAL LOW (ref 98–111)
Creatinine, Ser: 0.65 mg/dL (ref 0.44–1.00)
GFR, Estimated: >60 mL/min (ref >60)
Glucose, Bld: 132 mg/dL — ABNORMAL HIGH (ref 70–99)
Potassium: 3.2 mmol/L — ABNORMAL LOW (ref 3.5–5.1)
Sodium: 134 mmol/L — ABNORMAL LOW (ref 135–145)

## 2020-04-06 LAB — PROTEIN ELECTRO, RANDOM URINE
Albumin ELP, Urine: 83.7 %
Alpha-1-Globulin, U: 5.2 %
Alpha-2-Globulin, U: 2.2 %
Beta Globulin, U: 6.9 %
Gamma Globulin, U: 2 %
Total Protein, Urine: 75.1 mg/dL

## 2020-04-06 LAB — GLUCOSE, CAPILLARY
Glucose-Capillary: 126 mg/dL — ABNORMAL HIGH (ref 70–99)
Glucose-Capillary: 149 mg/dL — ABNORMAL HIGH (ref 70–99)
Glucose-Capillary: 224 mg/dL — ABNORMAL HIGH (ref 70–99)
Glucose-Capillary: 149 mg/dL — ABNORMAL HIGH (ref 70–99)

## 2020-04-06 LAB — MAGNESIUM
Magnesium: 1.8 mg/dL (ref 1.7–2.4)
Magnesium: 1.7 mg/dL (ref 1.7–2.4)

## 2020-04-06 LAB — C3 COMPLEMENT: C3 Complement: 138 mg/dL (ref 82–167)

## 2020-04-06 LAB — C4 COMPLEMENT: Complement C4, Body Fluid: 25 mg/dL (ref 12–38)

## 2020-04-06 LAB — GLOMERULAR BASEMENT MEMBRANE ANTIBODIES: GBM Ab: 3 units (ref 0–20)

## 2020-04-06 MED ORDER — CEPHALEXIN 500 MG PO CAPS
500.0000 mg | ORAL_CAPSULE | Freq: Three times a day (TID) | ORAL | Status: AC
  Administered 2020-04-07 (×3): 500 mg via ORAL
  Filled 2020-04-06 (×3): qty 1

## 2020-04-06 MED ORDER — IOHEXOL 300 MG/ML  SOLN
75.0000 mL | Freq: Once | INTRAMUSCULAR | Status: AC | PRN
  Administered 2020-04-06: 75 mL via INTRAVENOUS

## 2020-04-06 MED ORDER — POTASSIUM CHLORIDE CRYS ER 20 MEQ PO TBCR
40.0000 meq | EXTENDED_RELEASE_TABLET | Freq: Two times a day (BID) | ORAL | Status: DC
  Administered 2020-04-06 – 2020-04-14 (×8): 40 meq via ORAL
  Filled 2020-04-06 (×10): qty 2

## 2020-04-06 MED ORDER — POTASSIUM CHLORIDE CRYS ER 20 MEQ PO TBCR
40.0000 meq | EXTENDED_RELEASE_TABLET | Freq: Once | ORAL | Status: AC
  Administered 2020-04-06: 40 meq via ORAL
  Filled 2020-04-06: qty 2

## 2020-04-06 MED ORDER — MAGNESIUM SULFATE 2 GM/50ML IV SOLN
2.0000 g | Freq: Once | INTRAVENOUS | Status: AC
  Administered 2020-04-06: 2 g via INTRAVENOUS
  Filled 2020-04-06: qty 50

## 2020-04-06 NOTE — Care Management Important Message (Signed)
Important Message  Patient Details  Name: Martha [name] MRN: [id number] Date of Birth: November 13, 1945   Medicare Important Message Given:  Yes     Dannette Barbara 04/06/2020, 11:25 AM

## 2020-04-06 NOTE — Progress Notes (Addendum)
PROGRESS NOTE    Martha [name]  OVF:[id number] DOB: [birth year] DOA: 04/03/2020 PCP: Tracie Harrier, MD   Brief Narrative: Taken from H&P  Martha [name] is a 74 y.o. female with medical history significant of  HTN, HLD, DM, COPD, tobacco abuse, hypothyroidism, gout, depression, tremor, proteinuria, depression, anxiety, GERD, diverticulitis, who presents with bilateral leg edema and pain, and SOB.  Patient states that she has chronic bilateral leg edema which has been progressively worsened recently. Both lower legs are painful and erythematous. She states that she gets her lower extremities wrapped once a week.  She has chronic cough and shortness of breath which has slightly worsened on exertion.  She coughs up some white mucus.  No fever or chills.  No chest pain.  On presentation she was hypoxic requiring BiPAP, later transitioned to 4 L of oxygen. Patient also has an history of protein urea which is being worked up by nephrology as an outpatient and they are planning for biopsy.  Patient had pertinent labs positive for BNP of 1113, troponin and COVID-19 negative.  Chest x-ray with borderline cardiomegaly and mild pulmonary edema.  Significant edema of bilateral lower extremities with signs of venous congestion, skin peeling and few shallow ulcers.  Cardiology was consulted.  Subjective: Today feels about the same, no complaints. Continues with continued cough, this morning streaked with bright red blood.   Assessment & Plan:   Principal Problem:   Acute CHF (congestive heart failure) (HCC) Active Problems:   COPD exacerbation (HCC)   Diabetes mellitus without complication ([place])   Current tobacco use   Hyperlipidemia   Essential hypertension   Hypothyroidism   Gout   Depression   Tremors of nervous system   Acute respiratory failure with hypoxia (HCC)   Cellulitis of lower extremity   Acute pulmonary edema (HCC)   Elevated brain natriuretic peptide (BNP) level  Acute  hypoxic respiratory failure due to combination of acute on chronic CHF and COPD exacerbation.  Patient continues to have few basal crackles when seen today.  No wheezing today.  She had an echocardiogram done in 2019 in [place] with normal EF, no comment on diastolic dysfunction but she does has LVH.  Normal stress testing done in 2019.  BNP at 1113 and pulmonary edema on chest x-ray. Repeat echocardiogram ordered-normal EF and grade 1 diastolic dysfunction, difficult study.Cardiology was consulted-appreciate their recommendations. UOP 4750 yesterday -Continue with IV Lasix infusion. -Daily weight and BMP. -Strict intake and output.  Hemoptysis  Small volume, blood tinged sputum. No signs respiratory compromise. Suspect 2/2 coughing. Risk factors present. - check CT chest w/ contrast  Proteinuria/nephrotic syndrome.  Patient is being managed by nephrology as an outpatient.  Can be contributing to her lower extremity edema.  Nephrology is following and they start her on Lasix infusion instead of boluses. - She will get her kidney biopsy once volume status improves. - She was also placed on IV albumin. - continue lasix gtt  COPD exacerbation.  No wheezing today but she continued to require 3 to 4 L of oxygen.  Patient is very heavy smoker with an history of COPD although not using any home oxygen but per daughter she lays in her bed all the time and become short of breath with minor exertions.  She might need oxygen on discharge. -Continue with bronchodilators. -Switched Solu-Medrol with prednisone. -Continue with incentive spirometry. -Continue with supportive care. -Continue with supplemental oxygen to keep the saturation above 90%.  Bilateral lower extremity venous  stasis ulcerss.   Wound care consulted, unna boots and coban applied. On ceftriaxone for possible celluitis 12/5-12/8, will start on keflex today (also poss uti) with plan for 5 day course - continue compression  therapy -Continue with diuresis. -Continue with pain management  Bacteriuria Patient was unable to specify UTI symptoms but appears pan positive. UA with pyuria, bacteriuria and positive nitrites. -Urine culture growing E. coli-pending susceptibility. - has been started on abx, will plan for 5 day course, switch to keflex today, last day will be 12/9.  Hypokalemia K 3.2 this morning, likely 2/2 lasix gtt - kcl to 40 bid - f/u Mg  Type 2 diabetes mellitus.  A1c of 6.5.  Patient was on Metformin and glipizide at home. -Continue with SSI.  Tobacco abuse.  Patient is a heavy smoker, smokes all day long. Nicotine allergy was listed in her chart. -Ordered nicotine patch at daughter's request as patient was craving a lot for smoking.  Essential hypertension.   BP elevated thsi morning -Continue with Cozaar. - continue diuresis - add additional agent if bps still elevated off lasix gtt -Continue with as needed hydralazine.  Hypothyroidism. -Continue with home dose of Synthroid  Hyperlipidemia. -Continue with Crestor.  History of gout.  No acute concerns. -Colchicine as needed.  History of depression. -Continue home meds  History of tremors. -Continue home dose of primidone.  Objective: Vitals:   04/06/20 0418 04/06/20 0421 04/06/20 0807 04/06/20 1218  BP: (!) 176/63 105/68 (!) 163/85 (!) 172/79  Pulse: 92 88 95 (!) 105  Resp: 18  20 20   Temp: 98.7 F (37.1 C)  98.5 F (36.9 C) 98.4 F (36.9 C)  TempSrc: Oral  Oral Oral  SpO2: 94%  93% 91%  Weight:      Height:        Intake/Output Summary (Last 24 hours) at 04/06/2020 1227 Last data filed at 04/06/2020 1000 Gross per 24 hour  Intake 625.16 ml  Output 4150 ml  Net -3524.84 ml   Filed Weights   04/04/20 0422 04/05/20 0305 04/06/20 0403  Weight: 82.5 kg 80.2 kg 76.6 kg    Examination:  General.  Obese elderly lady, in no acute distress. Pulmonary.  Rales at bases, scattered rhonchi CV.  Regular rate and  rhythm, no JVD, rub or murmur. Abdomen.  Soft, nontender, nondistended, BS positive. CNS.  Alert and oriented x3.  No focal neurologic deficit. Extremities.  2+ LE edema, bilateral lower extremities with Unna boot. Psychiatry.  Judgment and insight appears normal.  DVT prophylaxis: Lovenox Code Status: Full Family Communication: Daughter was updated telephonically 12/8 Disposition Plan:  Status is: Inpatient  Remains inpatient appropriate because:Inpatient level of care appropriate due to severity of illness   Dispo: The patient is from: Home              Anticipated d/c is to: SNF (has bed, will just need covid test day of discharge)              Anticipated d/c date is: 2-4 days              Patient currently is not medically stable to d/c.    Consultants:   Cardiology  Procedures:  Antimicrobials:  Ceftriaxone  Data Reviewed: I have personally reviewed following labs and imaging studies  CBC: Recent Labs  Lab 03/31/20 0900 04/03/20 1141 04/04/20 0608  WBC 12.7* 10.7* 13.8*  NEUTROABS  --  9.4*  --   HGB 16.9* 17.5* 16.4*  HCT 52.6*  53.7* 51.7*  MCV 88.9 89.6 88.8  PLT 246 296 564   Basic Metabolic Panel: Recent Labs  Lab 04/03/20 1141 04/04/20 0608 04/05/20 0546 04/06/20 0426  NA 132* 134* 135 134*  K 4.5 4.4 4.2 3.2*  CL 95* 94* 92* 86*  CO2 28 29 32 36*  GLUCOSE 156* 159* 147* 132*  BUN 16 16 17 17   CREATININE 0.86 0.70 0.71 0.65  CALCIUM 8.1* 8.1* 8.1* 8.3*  MG  --  1.7 2.2 1.8   GFR: Estimated Creatinine Clearance: 60.9 mL/min (by C-G formula based on SCr of 0.65 mg/dL). Liver Function Tests: Recent Labs  Lab 04/03/20 1141  AST 19  ALT 11  ALKPHOS 89  BILITOT 0.4  PROT 6.7  ALBUMIN 2.7*   No results for input(s): LIPASE, AMYLASE in the last 168 hours. No results for input(s): AMMONIA in the last 168 hours. Coagulation Profile: Recent Labs  Lab 03/31/20 0900 04/04/20 0608  INR 1.0 1.0   Cardiac Enzymes: No results for input(s):  CKTOTAL, CKMB, CKMBINDEX, TROPONINI in the last 168 hours. BNP (last 3 results) No results for input(s): PROBNP in the last 8760 hours. HbA1C: Recent Labs    04/03/20 1410  HGBA1C 6.5*   CBG: Recent Labs  Lab 04/05/20 1152 04/05/20 1632 04/05/20 2103 04/06/20 0808 04/06/20 1136  GLUCAP 193* 130* 178* 126* 149*   Lipid Profile: No results for input(s): CHOL, HDL, LDLCALC, TRIG, CHOLHDL, LDLDIRECT in the last 72 hours. Thyroid Function Tests: No results for input(s): TSH, T4TOTAL, FREET4, T3FREE, THYROIDAB in the last 72 hours. Anemia Panel: No results for input(s): VITAMINB12, FOLATE, FERRITIN, TIBC, IRON, RETICCTPCT in the last 72 hours. Sepsis Labs: Recent Labs  Lab 04/03/20 1141 04/04/20 0608  PROCALCITON <0.10 <0.10    Recent Results (from the past 240 hour(s))  Resp Panel by RT-PCR (Flu A&B, Covid) Nasopharyngeal Swab     Status: None   Collection Time: 04/03/20 11:41 AM   Specimen: Nasopharyngeal Swab; Nasopharyngeal(NP) swabs in vial transport medium  Result Value Ref Range Status   SARS Coronavirus 2 by RT PCR NEGATIVE NEGATIVE Final    Comment: (NOTE) SARS-CoV-2 target nucleic acids are NOT DETECTED.  The SARS-CoV-2 RNA is generally detectable in upper respiratory specimens during the acute phase of infection. The lowest concentration of SARS-CoV-2 viral copies this assay can detect is 138 copies/mL. A negative result does not preclude SARS-Cov-2 infection and should not be used as the sole basis for treatment or other patient management decisions. A negative result may occur with  improper specimen collection/handling, submission of specimen other than nasopharyngeal swab, presence of viral mutation(s) within the areas targeted by this assay, and inadequate number of viral copies(<138 copies/mL). A negative result must be combined with clinical observations, patient history, and epidemiological information. The expected result is Negative.  Fact Sheet  for Patients:  EntrepreneurPulse.com.au  Fact Sheet for Healthcare Providers:  IncredibleEmployment.be  This test is no t yet approved or cleared by the [place] FDA and  has been authorized for detection and/or diagnosis of SARS-CoV-2 by FDA under an Emergency Use Authorization (EUA). This EUA will remain  in effect (meaning this test can be used) for the duration of the COVID-19 declaration under Section 564(b)(1) of the Act, 21 U.S.C.section 360bbb-3(b)(1), unless the authorization is terminated  or revoked sooner.       Influenza A by PCR NEGATIVE NEGATIVE Final   Influenza B by PCR NEGATIVE NEGATIVE Final    Comment: (NOTE) The Xpert Xpress  SARS-CoV-2/FLU/RSV plus assay is intended as an aid in the diagnosis of influenza from Nasopharyngeal swab specimens and should not be used as a sole basis for treatment. Nasal washings and aspirates are unacceptable for Xpert Xpress SARS-CoV-2/FLU/RSV testing.  Fact Sheet for Patients: EntrepreneurPulse.com.au  Fact Sheet for Healthcare Providers: IncredibleEmployment.be  This test is not yet approved or cleared by the [place] FDA and has been authorized for detection and/or diagnosis of SARS-CoV-2 by FDA under an Emergency Use Authorization (EUA). This EUA will remain in effect (meaning this test can be used) for the duration of the COVID-19 declaration under Section 564(b)(1) of the Act, 21 U.S.C. section 360bbb-3(b)(1), unless the authorization is terminated or revoked.  Performed at [facility], [address] [place], [place] [address]   Culture, blood (Routine X 2) w Reflex to ID Panel     Status: None (Preliminary result)   Collection Time: 04/03/20  3:27 PM   Specimen: BLOOD  Result Value Ref Range Status   Specimen Description BLOOD BLOOD RIGHT ARM  Final   Special Requests   Final    BOTTLES DRAWN AEROBIC AND ANAEROBIC Blood  Culture adequate volume   Culture   Final    NO GROWTH 3 DAYS Performed at [facility], [address] [place], [place] [address]    Report Status PENDING  Incomplete  Urine Culture     Status: Abnormal (Preliminary result)   Collection Time: 04/03/20  4:00 PM   Specimen: Urine, Random  Result Value Ref Range Status   Specimen Description   Final    URINE, RANDOM Performed at [facility], [address], [place], [place] [address]    Special Requests   Final    NONE Performed at [facility], [address] [place], [place] [address]    Culture >=100,000 COLONIES/mL ESCHERICHIA COLI (A)  Final   Report Status PENDING  Incomplete   Organism ID, Bacteria ESCHERICHIA COLI (A)  Final      Susceptibility   Escherichia coli - MIC*    AMPICILLIN 4 SENSITIVE Sensitive     CEFAZOLIN <=4 SENSITIVE Sensitive     CEFEPIME <=0.12 SENSITIVE Sensitive     CEFTRIAXONE <=0.25 SENSITIVE Sensitive     CIPROFLOXACIN <=0.25 SENSITIVE Sensitive     GENTAMICIN <=1 SENSITIVE Sensitive     IMIPENEM <=0.25 SENSITIVE Sensitive     NITROFURANTOIN 32 SENSITIVE Sensitive     TRIMETH/SULFA <=20 SENSITIVE Sensitive     AMPICILLIN/SULBACTAM <=2 SENSITIVE Sensitive     PIP/TAZO <=4 SENSITIVE Sensitive     * >=100,000 COLONIES/mL ESCHERICHIA COLI  Culture, blood (Routine X 2) w Reflex to ID Panel     Status: None (Preliminary result)   Collection Time: 04/03/20 10:36 PM   Specimen: BLOOD  Result Value Ref Range Status   Specimen Description BLOOD BLOOD LEFT HAND  Final   Special Requests   Final    BOTTLES DRAWN AEROBIC AND ANAEROBIC Blood Culture adequate volume   Culture   Final    NO GROWTH 3 DAYS Performed at [facility], [address], [place], [place] [address]    Report Status PENDING  Incomplete     Radiology Studies: No results found.  Scheduled Meds: . vitamin C  1,000 mg Oral Daily  . aspirin EC  81 mg Oral Daily  . [START ON 04/07/2020]  cephALEXin  500 mg Oral Q8H  . enoxaparin (LOVENOX) injection  40 mg Subcutaneous Q24H  . fluticasone  1 spray Each Nare BID  . gabapentin  600 mg Oral TID  . insulin aspart  0-5 Units Subcutaneous QHS  . insulin aspart  0-9 Units Subcutaneous TID WC  . ipratropium-albuterol  3 mL Nebulization Q4H  . levothyroxine  150 mcg Oral Q0600  . loratadine  10 mg Oral Daily  . losartan  50 mg Oral Daily  . nicotine  21 mg Transdermal Daily  . predniSONE  50 mg Oral Q breakfast  . primidone  50 mg Oral BID  . rOPINIRole  0.5 mg Oral QHS  . rosuvastatin  5 mg Oral Daily  . sertraline  50 mg Oral Daily  . sodium chloride flush  3 mL Intravenous Q12H  . vitamin B-12  1,000 mcg Oral Daily   Continuous Infusions: . sodium chloride    . albumin human 25 g (04/06/20 0551)  . furosemide (LASIX) 200 mg in dextrose 5% 100 mL (2mg /mL) infusion 6 mg/hr (04/05/20 1635)     LOS: 3 days   Time spent: 35 minutes  Desma Maxim, MD [facility]  If 7PM-7AM, please contact night-coverage [email or web address]  04/06/2020, 12:27 PM

## 2020-04-06 NOTE — Progress Notes (Signed)
[facility]  ROUNDING NOTE   Subjective:  Patient known to [place] from the office. She was recently noted as having significant edema along with proteinuria of at least 17 g. She was seen in the hospital today as she was unable to have renal biopsy performed given her underlying edema status as well as shortness of breath. Pure wick is in place and urine canister shows a significant amount of foam on top of her urine indicating significant proteinuria.   Update Patient continues to be on furosemide infusion, total urine output for the preceding 24 hours is  4750 mL.  We will continue diuresing her one more day.  We will plan for biopsy possibly tomorrow   Objective:  Vital signs in last 24 hours:  Temp:  [98.3 F (36.8 C)-98.9 F (37.2 C)] 98.3 F (36.8 C) (12/08 1520) Pulse Rate:  [88-105] 103 (12/08 1520) Resp:  [16-20] 20 (12/08 1520) BP: (105-176)/(55-85) 114/55 (12/08 1520) SpO2:  [91 %-97 %] 94 % (12/08 1520) Weight:  [76.6 kg] 76.6 kg (12/08 0403)  Weight change: -3.596 kg Filed Weights   04/04/20 0422 04/05/20 0305 04/06/20 0403  Weight: 82.5 kg 80.2 kg 76.6 kg    Intake/Output: I/O last 3 completed shifts: In: 1078.8 [P.O.:600; I.V.:109.1; IV Piggyback:369.7] Out: 6100 [Urine:6100]   Intake/Output this shift:  Total I/O In: 794.2 [P.O.:600; I.V.:44.2; IV Piggyback:150] Out: 1350 [Urine:1350]  Physical Exam: General:  Resting in bed, in no acute distress  Head:  Normocephalic, atraumatic  Eyes:  Anicteric  Lungs:   Wheezes bilaterally, 4 L O2 via nasal cannula, respirations slightly labored  Heart:  S1-S2, no rubs or gallops  Abdomen:   Soft, nontender, nondistended  Extremities:  Trace peripheral edema  Neurologic:  Awake, alert, speech clear and appropriate  Skin:  No acute lesions or rashes    Basic Metabolic Panel: Recent Labs  Lab 04/03/20 1141 04/03/20 1141 04/04/20 0608 04/05/20 0546 04/06/20 0426 04/06/20 1253  NA 132*  --  134*  135 134*  --   K 4.5  --  4.4 4.2 3.2*  --   CL 95*  --  94* 92* 86*  --   CO2 28  --  29 32 36*  --   GLUCOSE 156*  --  159* 147* 132*  --   BUN 16  --  16 17 17   --   CREATININE 0.86  --  0.70 0.71 0.65  --   CALCIUM 8.1*   < > 8.1* 8.1* 8.3*  --   MG  --   --  1.7 2.2 1.8 1.7   < > = values in this interval not displayed.    Liver Function Tests: Recent Labs  Lab 04/03/20 1141  AST 19  ALT 11  ALKPHOS 89  BILITOT 0.4  PROT 6.7  ALBUMIN 2.7*   No results for input(s): LIPASE, AMYLASE in the last 168 hours. No results for input(s): AMMONIA in the last 168 hours.  CBC: Recent Labs  Lab 03/31/20 0900 04/03/20 1141 04/04/20 0608  WBC 12.7* 10.7* 13.8*  NEUTROABS  --  9.4*  --   HGB 16.9* 17.5* 16.4*  HCT 52.6* 53.7* 51.7*  MCV 88.9 89.6 88.8  PLT 246 296 236    Cardiac Enzymes: No results for input(s): CKTOTAL, CKMB, CKMBINDEX, TROPONINI in the last 168 hours.  BNP: Invalid input(s): POCBNP  CBG: Recent Labs  Lab 04/05/20 1632 04/05/20 2103 04/06/20 0808 04/06/20 1136 04/06/20 1629  GLUCAP 130* 178* 126*  149* 224*    Microbiology: Results for orders placed or performed during the hospital encounter of 04/03/20  Resp Panel by RT-PCR (Flu A&B, Covid) Nasopharyngeal Swab     Status: None   Collection Time: 04/03/20 11:41 AM   Specimen: Nasopharyngeal Swab; Nasopharyngeal(NP) swabs in vial transport medium  Result Value Ref Range Status   SARS Coronavirus 2 by RT PCR NEGATIVE NEGATIVE Final    Comment: (NOTE) SARS-CoV-2 target nucleic acids are NOT DETECTED.  The SARS-CoV-2 RNA is generally detectable in upper respiratory specimens during the acute phase of infection. The lowest concentration of SARS-CoV-2 viral copies this assay can detect is 138 copies/mL. A negative result does not preclude SARS-Cov-2 infection and should not be used as the sole basis for treatment or other patient management decisions. A negative result may occur with  improper  specimen collection/handling, submission of specimen other than nasopharyngeal swab, presence of viral mutation(s) within the areas targeted by this assay, and inadequate number of viral copies(<138 copies/mL). A negative result must be combined with clinical observations, patient history, and epidemiological information. The expected result is Negative.  Fact Sheet for Patients:  EntrepreneurPulse.com.au  Fact Sheet for Healthcare Providers:  IncredibleEmployment.be  This test is no t yet approved or cleared by the [place] FDA and  has been authorized for detection and/or diagnosis of SARS-CoV-2 by FDA under an Emergency Use Authorization (EUA). This EUA will remain  in effect (meaning this test can be used) for the duration of the COVID-19 declaration under Section 564(b)(1) of the Act, 21 U.S.C.section 360bbb-3(b)(1), unless the authorization is terminated  or revoked sooner.       Influenza A by PCR NEGATIVE NEGATIVE Final   Influenza B by PCR NEGATIVE NEGATIVE Final    Comment: (NOTE) The Xpert Xpress SARS-CoV-2/FLU/RSV plus assay is intended as an aid in the diagnosis of influenza from Nasopharyngeal swab specimens and should not be used as a sole basis for treatment. Nasal washings and aspirates are unacceptable for Xpert Xpress SARS-CoV-2/FLU/RSV testing.  Fact Sheet for Patients: EntrepreneurPulse.com.au  Fact Sheet for Healthcare Providers: IncredibleEmployment.be  This test is not yet approved or cleared by the [place] FDA and has been authorized for detection and/or diagnosis of SARS-CoV-2 by FDA under an Emergency Use Authorization (EUA). This EUA will remain in effect (meaning this test can be used) for the duration of the COVID-19 declaration under Section 564(b)(1) of the Act, 21 U.S.C. section 360bbb-3(b)(1), unless the authorization is terminated or revoked.  Performed at  [facility], [address] [place], [place] [address]   Culture, blood (Routine X 2) w Reflex to ID Panel     Status: None (Preliminary result)   Collection Time: 04/03/20  3:27 PM   Specimen: BLOOD  Result Value Ref Range Status   Specimen Description BLOOD BLOOD RIGHT ARM  Final   Special Requests   Final    BOTTLES DRAWN AEROBIC AND ANAEROBIC Blood Culture adequate volume   Culture   Final    NO GROWTH 3 DAYS Performed at [facility], [address] [place], [place] [address]    Report Status PENDING  Incomplete  Urine Culture     Status: Abnormal (Preliminary result)   Collection Time: 04/03/20  4:00 PM   Specimen: Urine, Random  Result Value Ref Range Status   Specimen Description   Final    URINE, RANDOM Performed at [facility], [address] [place], [place] [address]    Special  Requests   Final    NONE Performed at [facility], [address] [place], [place] [address]    Culture >=100,000 COLONIES/mL ESCHERICHIA COLI (A)  Final   Report Status PENDING  Incomplete   Organism ID, Bacteria ESCHERICHIA COLI (A)  Final      Susceptibility   Escherichia coli - MIC*    AMPICILLIN 4 SENSITIVE Sensitive     CEFAZOLIN <=4 SENSITIVE Sensitive     CEFEPIME <=0.12 SENSITIVE Sensitive     CEFTRIAXONE <=0.25 SENSITIVE Sensitive     CIPROFLOXACIN <=0.25 SENSITIVE Sensitive     GENTAMICIN <=1 SENSITIVE Sensitive     IMIPENEM <=0.25 SENSITIVE Sensitive     NITROFURANTOIN 32 SENSITIVE Sensitive     TRIMETH/SULFA <=20 SENSITIVE Sensitive     AMPICILLIN/SULBACTAM <=2 SENSITIVE Sensitive     PIP/TAZO <=4 SENSITIVE Sensitive     * >=100,000 COLONIES/mL ESCHERICHIA COLI  Culture, blood (Routine X 2) w Reflex to ID Panel     Status: None (Preliminary result)   Collection Time: 04/03/20 10:36 PM   Specimen: BLOOD  Result Value Ref Range Status   Specimen Description BLOOD BLOOD LEFT HAND  Final   Special Requests   Final    BOTTLES  DRAWN AEROBIC AND ANAEROBIC Blood Culture adequate volume   Culture   Final    NO GROWTH 3 DAYS Performed at [facility], [address], [place], [place] [address]    Report Status PENDING  Incomplete    Coagulation Studies: Recent Labs    04/04/20 0608  LABPROT 12.6  INR 1.0    Urinalysis: No results for input(s): COLORURINE, LABSPEC, PHURINE, GLUCOSEU, HGBUR, BILIRUBINUR, KETONESUR, PROTEINUR, UROBILINOGEN, NITRITE, LEUKOCYTESUR in the last 72 hours.  Invalid input(s): APPERANCEUR    Imaging: CT CHEST W CONTRAST  Result Date: 04/06/2020 CLINICAL DATA:  Hemoptysis. EXAM: CT CHEST WITH CONTRAST TECHNIQUE: Multidetector CT imaging of the chest was performed during intravenous contrast administration. CONTRAST:  35mL OMNIPAQUE IOHEXOL 300 MG/ML  SOLN COMPARISON:  January 06, 2019 FINDINGS: Cardiovascular: There is marked severity calcification of the aortic arch. There is mild cardiomegaly. No pericardial effusion. Mediastinum/Nodes: There is mild pretracheal and right hilar lymphadenopathy. Thyroid gland, trachea, and esophagus demonstrate no significant findings. Lungs/Pleura: Mild-to-moderate severity emphysematous lung disease is seen involving the bilateral upper lobes with stable bullous disease is seen along the medial aspect of the left apex. Mild to moderate severity areas of atelectasis and/or infiltrate are seen within the posterior aspects of the bilateral upper lobes and left lung base. Moderate to marked severity posterior right basilar atelectasis and/or infiltrate is noted. A very small right pleural effusion is seen. No pneumothorax is identified. Upper Abdomen: There is stable hepatosplenomegaly with stable, moderate severity diffuse bilateral adrenal gland enlargement. Musculoskeletal: A chronic lateral eighth left rib fracture is seen. A chronic compression fracture deformity of the T12 vertebral body is noted. IMPRESSION: 1. Mild-to-moderate severity posterior  bilateral upper lobe and left basilar atelectasis and/or infiltrate. 2. Moderate to marked severity posterior right basilar atelectasis and/or infiltrate. 3. Very small right pleural effusion. 4. Mild cardiomegaly. 5. Stable emphysematous lung disease. 6. Chronic lateral eighth left rib fracture. 7. Chronic compression fracture deformity of the T12 vertebral body. 8. Aortic atherosclerosis. Aortic Atherosclerosis (ICD10-I70.0) and Emphysema (ICD10-J43.9). Electronically Signed   By: Virgina Norfolk M.D.   On: 04/06/2020 15:33     Medications:   . sodium chloride    . albumin human 25 g (04/06/20 1237)  . furosemide (  LASIX) 200 mg in dextrose 5% 100 mL (2mg /mL) infusion 6 mg/hr (04/05/20 1635)   . vitamin C  1,000 mg Oral Daily  . aspirin EC  81 mg Oral Daily  . [START ON 04/07/2020] cephALEXin  500 mg Oral Q8H  . enoxaparin (LOVENOX) injection  40 mg Subcutaneous Q24H  . fluticasone  1 spray Each Nare BID  . gabapentin  600 mg Oral TID  . insulin aspart  0-5 Units Subcutaneous QHS  . insulin aspart  0-9 Units Subcutaneous TID WC  . ipratropium-albuterol  3 mL Nebulization Q4H  . levothyroxine  150 mcg Oral Q0600  . loratadine  10 mg Oral Daily  . losartan  50 mg Oral Daily  . nicotine  21 mg Transdermal Daily  . potassium chloride  40 mEq Oral BID  . predniSONE  50 mg Oral Q breakfast  . primidone  50 mg Oral BID  . rOPINIRole  0.5 mg Oral QHS  . rosuvastatin  5 mg Oral Daily  . sertraline  50 mg Oral Daily  . sodium chloride flush  3 mL Intravenous Q12H  . vitamin B-12  1,000 mcg Oral Daily   sodium chloride, acetaminophen, albuterol, colchicine, dextromethorphan-guaiFENesin, fentaNYL (SUBLIMAZE) injection, fluticasone, hydrALAZINE, ondansetron (ZOFRAN) IV, oxyCODONE-acetaminophen, sodium chloride flush, tiZANidine  Assessment/ Plan:  74 y.o. female with past medical history of COPD, anxiety, hypertension, longstanding diabetes mellitus type 2, peripheral vascular disease,  longstanding tobacco abuse who has nephrotic range proteinuria of 17 g.  #Proteinuria, 17 g.   # Nephrotic syndrome.   Patient diuresing well on Lasix Shortness of breath much better, we will continue diuresing one more day We will plan for biopsy tomorrow  #Volume overload.   Total urine output for the preceding 24 hours is 4750 mL Peripheral edema improved significantly Continue furosemide at current rate 6 mg/h  #Hypertension Continue losartan  #Diabetes mellitus type 2 Lab Results  Component Value Date   HGBA1C 6.5 (H) 04/03/2020   Patient is on insulin aspart   LOS: 3 Ellisa Devivo 12/8/20216:47 PM

## 2020-04-07 ENCOUNTER — Inpatient Hospital Stay: Payer: Medicare Other

## 2020-04-07 ENCOUNTER — Encounter: Payer: Self-pay | Admitting: Internal Medicine

## 2020-04-07 LAB — BASIC METABOLIC PANEL
Anion gap: 14 (ref 5–15)
BUN: 25 mg/dL — ABNORMAL HIGH (ref 8–23)
CO2: 36 mmol/L — ABNORMAL HIGH (ref 22–32)
Calcium: 8.7 mg/dL — ABNORMAL LOW (ref 8.9–10.3)
Chloride: 88 mmol/L — ABNORMAL LOW (ref 98–111)
Creatinine, Ser: 0.65 mg/dL (ref 0.44–1.00)
GFR, Estimated: >60 mL/min (ref >60)
Glucose, Bld: 90 mg/dL (ref 70–99)
Potassium: 3.6 mmol/L (ref 3.5–5.1)
Sodium: 138 mmol/L (ref 135–145)

## 2020-04-07 LAB — PROTIME-INR
INR: 1.1 (ref 0.8–1.2)
Prothrombin Time: 13.8 seconds (ref 11.4–15.2)

## 2020-04-07 LAB — BLOOD GAS, VENOUS
Acid-Base Excess: 19.5 mmol/L — ABNORMAL HIGH (ref 0.0–2.0)
Bicarbonate: 48 mmol/L — ABNORMAL HIGH (ref 20.0–28.0)
O2 Saturation: 99.7 %
Patient temperature: 37
pCO2, Ven: 69 mmHg — ABNORMAL HIGH (ref 44.0–60.0)
pH, Ven: 7.45 — ABNORMAL HIGH (ref 7.250–7.430)
pO2, Ven: 186 mmHg — ABNORMAL HIGH (ref 32.0–45.0)

## 2020-04-07 LAB — FIBRIN DERIVATIVES D-DIMER (ARMC ONLY): Fibrin derivatives D-dimer (ARMC): 751.04 ng/mL (FEU) — ABNORMAL HIGH (ref 0.00–499.00)

## 2020-04-07 LAB — URINE CULTURE: Culture: >=100000 — AB

## 2020-04-07 LAB — CBC
HCT: 45.5 % (ref 36.0–46.0)
Hemoglobin: 14.1 g/dL (ref 12.0–15.0)
MCH: 28.5 pg (ref 26.0–34.0)
MCHC: 31 g/dL (ref 30.0–36.0)
MCV: 91.9 fL (ref 80.0–100.0)
Platelets: 177 10*3/uL (ref 150–400)
RBC: 4.95 MIL/uL (ref 3.87–5.11)
RDW: 18.4 % — ABNORMAL HIGH (ref 11.5–15.5)
WBC: 8.6 10*3/uL (ref 4.0–10.5)
nRBC: 0 % (ref 0.0–0.2)

## 2020-04-07 LAB — GLUCOSE, CAPILLARY
Glucose-Capillary: 120 mg/dL — ABNORMAL HIGH (ref 70–99)
Glucose-Capillary: 251 mg/dL — ABNORMAL HIGH (ref 70–99)
Glucose-Capillary: 170 mg/dL — ABNORMAL HIGH (ref 70–99)
Glucose-Capillary: 155 mg/dL — ABNORMAL HIGH (ref 70–99)

## 2020-04-07 LAB — MPO/PR-3 (ANCA) ANTIBODIES
ANCA Proteinase 3: <3.5 U/mL (ref 0.0–3.5)
Myeloperoxidase Abs: <9 U/mL (ref 0.0–9.0)

## 2020-04-07 LAB — TYPE AND SCREEN
ABO/RH(D): O POS
Antibody Screen: NEGATIVE

## 2020-04-07 LAB — MAGNESIUM: Magnesium: 2.1 mg/dL (ref 1.7–2.4)

## 2020-04-07 LAB — PROCALCITONIN: Procalcitonin: <0.1 ng/mL

## 2020-04-07 MED ORDER — AMLODIPINE BESYLATE 10 MG PO TABS
10.0000 mg | ORAL_TABLET | Freq: Every day | ORAL | Status: DC
  Administered 2020-04-07 – 2020-04-21 (×13): 10 mg via ORAL
  Filled 2020-04-07 (×13): qty 1

## 2020-04-07 MED ORDER — ALBUMIN HUMAN 25 % IV SOLN
25.0000 g | Freq: Three times a day (TID) | INTRAVENOUS | Status: DC
  Administered 2020-04-07 – 2020-04-09 (×4): 25 g via INTRAVENOUS
  Filled 2020-04-07 (×5): qty 100

## 2020-04-07 MED ORDER — IOHEXOL 350 MG/ML SOLN
75.0000 mL | Freq: Once | INTRAVENOUS | Status: AC | PRN
  Administered 2020-04-07: 75 mL via INTRAVENOUS

## 2020-04-07 MED ORDER — HYDRALAZINE HCL 20 MG/ML IJ SOLN
10.0000 mg | INTRAMUSCULAR | Status: DC | PRN
  Administered 2020-04-08 – 2020-04-10 (×9): 10 mg via INTRAVENOUS
  Filled 2020-04-07 (×9): qty 1

## 2020-04-07 NOTE — Progress Notes (Signed)
PROGRESS NOTE    Martha [name]  YJE:[id number] DOB: 08-30-1945 DOA: 04/03/2020 PCP: Tracie Harrier, MD   Brief Narrative: Taken from H&P  Martha [name] is a 74 y.o. female with medical history significant of  HTN, HLD, DM, COPD, tobacco abuse, hypothyroidism, gout, depression, tremor, proteinuria, depression, anxiety, GERD, diverticulitis, who presents with bilateral leg edema and pain, and SOB.  Patient states that she has chronic bilateral leg edema which has been progressively worsened recently. Both lower legs are painful and erythematous. She states that she gets her lower extremities wrapped once a week.  She has chronic cough and shortness of breath which has slightly worsened on exertion.  She coughs up some white mucus.  No fever or chills.  No chest pain.  On presentation she was hypoxic requiring BiPAP, later transitioned to 4 L of oxygen. Patient also has an history of protein urea which is being worked up by nephrology as an outpatient and they are planning for biopsy.  Patient had pertinent labs positive for BNP of 1113, troponin and COVID-19 negative.  Chest x-ray with borderline cardiomegaly and mild pulmonary edema.  Significant edema of bilateral lower extremities with signs of venous congestion, skin peeling and few shallow ulcers.  Cardiology was consulted.  Subjective: Placed on bipap this morning by respiratory therapy, currently sleeping but rousable.  Assessment & Plan:   Principal Problem:   Acute CHF (congestive heart failure) (HCC) Active Problems:   COPD exacerbation (HCC)   Diabetes mellitus without complication ([place])   Current tobacco use   Hyperlipidemia   Essential hypertension   Hypothyroidism   Gout   Depression   Tremors of nervous system   Acute respiratory failure with hypoxia (HCC)   Cellulitis of lower extremity   Acute pulmonary edema (HCC)   Elevated brain natriuretic peptide (BNP) level  Acute hypoxic respiratory failure due to  combination of acute on chronic CHF and COPD exacerbation.  TTE w/ diastolic dysfunction. Here bnp elevated. UOP has been good, neg neg 1 L yesterday.  Normal stress testing done in 2019.  CT obtained 12/8 for hemoptysis w/ what appears to be primarily atelectasis. Back on bipap this morning per respiratory was somnolent and hypoxic - will check vbg, procalcitonin, d dimer, will discuss w/ nephrology safety of CTA to eval for PE -Continue with IV Lasix infusion. -Daily weight and BMP. -Strict intake and output.  Hemoptysis  Small volume, blood tinged sputum. No signs respiratory compromise. Suspect 2/2 coughing. Ct w/ contrast showing atelectasis/infiltrate - further w/u as above  Proteinuria/nephrotic syndrome.  Patient is being managed by nephrology as an outpatient.  Can be contributing to her lower extremity edema.  Nephrology is following and they start her on Lasix infusion instead of boluses. - kidney biopsy scheduled for tomorrow am - She was also placed on IV albumin. - continue lasix gtt  COPD exacerbation.  No wheezing today but she continued to require 3 to 4 L of oxygen.  Patient is very heavy smoker with an history of COPD although not using any home oxygen but per daughter she lays in her bed all the time and become short of breath with minor exertions.  She might need oxygen on discharge. -Continue with bronchodilators. -Switched Solu-Medrol with prednisone. -Continue with incentive spirometry. -Continue with supportive care. -Continue with supplemental oxygen to keep the saturation above 90%.  Bilateral lower extremity venous stasis ulcerss.   Wound care consulted, compression wrapped. On ceftriaxone for possible celluitis 12/5-12/8, now on keflex (  also poss uti) with plan for 5 day course - continue compression therapy -Continue with diuresis. -Continue with pain management  Bacteriuria Patient was unable to specify UTI symptoms but appears pan positive. UA with pyuria,  bacteriuria and positive nitrites. -Urine culture growing E. Coli pan sensitive - has been started on abx, will plan for 5 day course, switch to keflex today, last day will be 12/9.  Hypokalemia Resolved, likely 2/2 lasix - kcl 40 bid  Type 2 diabetes mellitus.  A1c of 6.5.  Patient was on Metformin and glipizide at home. -Continue with SSI.  Tobacco abuse.  Patient is a heavy smoker, smokes all day long. Nicotine allergy was listed in her chart. -Ordered nicotine patch at daughter's request as patient was craving a lot for smoking.  Essential hypertension.   BP elevated thsi morning -Continue with Cozaar. - add amlodipine - continue diuresis -Continue with as needed hydralazine.  Hypothyroidism. -Continue with home dose of Synthroid  Hyperlipidemia. -Continue with Crestor.  History of gout.  No acute concerns. -Colchicine as needed.  History of depression. -Continue home meds  History of tremors. -Continue home dose of primidone.  Objective: Vitals:   04/07/20 1143 04/07/20 1146 04/07/20 1151 04/07/20 1204  BP:    (!) 187/101  Pulse:   (!) 102 (!) 104  Resp:   12 20  Temp:    98.1 F (36.7 C)  TempSrc:    Oral  SpO2: (S) (!) 88% (S) (!) 86% 90% 100%  Weight:      Height:        Intake/Output Summary (Last 24 hours) at 04/07/2020 1335 Last data filed at 04/07/2020 0542 Gross per 24 hour  Intake 314.24 ml  Output 1250 ml  Net -935.76 ml   Filed Weights   04/05/20 0305 04/06/20 0403 04/07/20 0500  Weight: 80.2 kg 76.6 kg 74 kg    Examination:  General.  Obese elderly lady, in no acute distress. Pulmonary.  Rales at bases, scattered rhonchi CV.  Regular rate and rhythm, no JVD, rub or murmur. Abdomen.  Soft, nontender, nondistended, BS positive. CNS.  Asleep but arousable Extremities.  2+ LE edema, bilateral lower extremities with Unna boot. Psychiatry.  Judgment and insight appears normal.  DVT prophylaxis: Lovenox Code Status: Full Family  Communication: Daughter was updated telephonically 12/8 Disposition Plan:  Status is: Inpatient  Remains inpatient appropriate because:Inpatient level of care appropriate due to severity of illness   Dispo: The patient is from: Home              Anticipated d/c is to: SNF (has bed, will just need covid test day of discharge)              Anticipated d/c date is: 2-4 days              Patient currently is not medically stable to d/c.    Consultants:   Cardiology  Procedures:  Antimicrobials:  Ceftriaxone>keflex  Data Reviewed: I have personally reviewed following labs and imaging studies  CBC: Recent Labs  Lab 04/03/20 1141 04/04/20 0608 04/07/20 0416  WBC 10.7* 13.8* 8.6  NEUTROABS 9.4*  --   --   HGB 17.5* 16.4* 14.1  HCT 53.7* 51.7* 45.5  MCV 89.6 88.8 91.9  PLT 296 236 782   Basic Metabolic Panel: Recent Labs  Lab 04/03/20 1141 04/04/20 0608 04/05/20 0546 04/06/20 0426 04/06/20 1253 04/07/20 0416  NA 132* 134* 135 134*  --  138  K 4.5 4.4  4.2 3.2*  --  3.6  CL 95* 94* 92* 86*  --  88*  CO2 28 29 32 36*  --  36*  GLUCOSE 156* 159* 147* 132*  --  90  BUN 16 16 17 17   --  25*  CREATININE 0.86 0.70 0.71 0.65  --  0.65  CALCIUM 8.1* 8.1* 8.1* 8.3*  --  8.7*  MG  --  1.7 2.2 1.8 1.7 2.1   GFR: Estimated Creatinine Clearance: 59.9 mL/min (by C-G formula based on SCr of 0.65 mg/dL). Liver Function Tests: Recent Labs  Lab 04/03/20 1141  AST 19  ALT 11  ALKPHOS 89  BILITOT 0.4  PROT 6.7  ALBUMIN 2.7*   No results for input(s): LIPASE, AMYLASE in the last 168 hours. No results for input(s): AMMONIA in the last 168 hours. Coagulation Profile: Recent Labs  Lab 04/04/20 0608 04/07/20 0756  INR 1.0 1.1   Cardiac Enzymes: No results for input(s): CKTOTAL, CKMB, CKMBINDEX, TROPONINI in the last 168 hours. BNP (last 3 results) No results for input(s): PROBNP in the last 8760 hours. HbA1C: No results for input(s): HGBA1C in the last 72  hours. CBG: Recent Labs  Lab 04/06/20 1136 04/06/20 1629 04/06/20 2058 04/07/20 0818 04/07/20 1133  GLUCAP 149* 224* 149* 120* 251*   Lipid Profile: No results for input(s): CHOL, HDL, LDLCALC, TRIG, CHOLHDL, LDLDIRECT in the last 72 hours. Thyroid Function Tests: No results for input(s): TSH, T4TOTAL, FREET4, T3FREE, THYROIDAB in the last 72 hours. Anemia Panel: No results for input(s): VITAMINB12, FOLATE, FERRITIN, TIBC, IRON, RETICCTPCT in the last 72 hours. Sepsis Labs: Recent Labs  Lab 04/03/20 1141 04/04/20 0608  PROCALCITON <0.10 <0.10    Recent Results (from the past 240 hour(s))  Resp Panel by RT-PCR (Flu A&B, Covid) Nasopharyngeal Swab     Status: None   Collection Time: 04/03/20 11:41 AM   Specimen: Nasopharyngeal Swab; Nasopharyngeal(NP) swabs in vial transport medium  Result Value Ref Range Status   SARS Coronavirus 2 by RT PCR NEGATIVE NEGATIVE Final    Comment: (NOTE) SARS-CoV-2 target nucleic acids are NOT DETECTED.  The SARS-CoV-2 RNA is generally detectable in upper respiratory specimens during the acute phase of infection. The lowest concentration of SARS-CoV-2 viral copies this assay can detect is 138 copies/mL. A negative result does not preclude SARS-Cov-2 infection and should not be used as the sole basis for treatment or other patient management decisions. A negative result may occur with  improper specimen collection/handling, submission of specimen other than nasopharyngeal swab, presence of viral mutation(s) within the areas targeted by this assay, and inadequate number of viral copies(<138 copies/mL). A negative result must be combined with clinical observations, patient history, and epidemiological information. The expected result is Negative.  Fact Sheet for Patients:  EntrepreneurPulse.com.au  Fact Sheet for Healthcare Providers:  IncredibleEmployment.be  This test is no t yet approved or cleared by  the [place] FDA and  has been authorized for detection and/or diagnosis of SARS-CoV-2 by FDA under an Emergency Use Authorization (EUA). This EUA will remain  in effect (meaning this test can be used) for the duration of the COVID-19 declaration under Section 564(b)(1) of the Act, 21 U.S.C.section 360bbb-3(b)(1), unless the authorization is terminated  or revoked sooner.       Influenza A by PCR NEGATIVE NEGATIVE Final   Influenza B by PCR NEGATIVE NEGATIVE Final    Comment: (NOTE) The Xpert Xpress SARS-CoV-2/FLU/RSV plus assay is intended as an aid in the diagnosis  of influenza from Nasopharyngeal swab specimens and should not be used as a sole basis for treatment. Nasal washings and aspirates are unacceptable for Xpert Xpress SARS-CoV-2/FLU/RSV testing.  Fact Sheet for Patients: EntrepreneurPulse.com.au  Fact Sheet for Healthcare Providers: IncredibleEmployment.be  This test is not yet approved or cleared by the [place] FDA and has been authorized for detection and/or diagnosis of SARS-CoV-2 by FDA under an Emergency Use Authorization (EUA). This EUA will remain in effect (meaning this test can be used) for the duration of the COVID-19 declaration under Section 564(b)(1) of the Act, 21 U.S.C. section 360bbb-3(b)(1), unless the authorization is terminated or revoked.  Performed at [facility], [address] [place], [place] [address]   Culture, blood (Routine X 2) w Reflex to ID Panel     Status: None (Preliminary result)   Collection Time: 04/03/20  3:27 PM   Specimen: BLOOD  Result Value Ref Range Status   Specimen Description BLOOD BLOOD RIGHT ARM  Final   Special Requests   Final    BOTTLES DRAWN AEROBIC AND ANAEROBIC Blood Culture adequate volume   Culture   Final    NO GROWTH 4 DAYS Performed at [facility], [address] [place], [place] [address]    Report Status PENDING  Incomplete   Urine Culture     Status: Abnormal   Collection Time: 04/03/20  4:00 PM   Specimen: Urine, Random  Result Value Ref Range Status   Specimen Description   Final    URINE, RANDOM Performed at [facility], [address] [place], [place] [address]    Special Requests   Final    NONE Performed at [facility], [address], [place], [place] [address]    Culture (A)  Final    >=100,000 COLONIES/mL ESCHERICHIA COLI >=100,000 COLONIES/mL AEROCOCCUS URINAE Standardized susceptibility testing for this organism is not available. Performed at [facility], [place] [address], [place], [place] [address]    Report Status 04/07/2020 FINAL  Final   Organism ID, Bacteria ESCHERICHIA COLI (A)  Final      Susceptibility   Escherichia coli - MIC*    AMPICILLIN 4 SENSITIVE Sensitive     CEFAZOLIN <=4 SENSITIVE Sensitive     CEFEPIME <=0.12 SENSITIVE Sensitive     CEFTRIAXONE <=0.25 SENSITIVE Sensitive     CIPROFLOXACIN <=0.25 SENSITIVE Sensitive     GENTAMICIN <=1 SENSITIVE Sensitive     IMIPENEM <=0.25 SENSITIVE Sensitive     NITROFURANTOIN 32 SENSITIVE Sensitive     TRIMETH/SULFA <=20 SENSITIVE Sensitive     AMPICILLIN/SULBACTAM <=2 SENSITIVE Sensitive     PIP/TAZO <=4 SENSITIVE Sensitive     * >=100,000 COLONIES/mL ESCHERICHIA COLI  Culture, blood (Routine X 2) w Reflex to ID Panel     Status: None (Preliminary result)   Collection Time: 04/03/20 10:36 PM   Specimen: BLOOD  Result Value Ref Range Status   Specimen Description BLOOD BLOOD LEFT HAND  Final   Special Requests   Final    BOTTLES DRAWN AEROBIC AND ANAEROBIC Blood Culture adequate volume   Culture   Final    NO GROWTH 4 DAYS Performed at [facility], [address] [place], [place] [address]    Report Status PENDING  Incomplete     Radiology Studies: CT CHEST W CONTRAST  Result Date: 04/06/2020 CLINICAL DATA:  Hemoptysis. EXAM: CT CHEST WITH CONTRAST TECHNIQUE: Multidetector CT  imaging of the chest was performed during intravenous contrast  administration. CONTRAST:  29mL OMNIPAQUE IOHEXOL 300 MG/ML  SOLN COMPARISON:  January 06, 2019 FINDINGS: Cardiovascular: There is marked severity calcification of the aortic arch. There is mild cardiomegaly. No pericardial effusion. Mediastinum/Nodes: There is mild pretracheal and right hilar lymphadenopathy. Thyroid gland, trachea, and esophagus demonstrate no significant findings. Lungs/Pleura: Mild-to-moderate severity emphysematous lung disease is seen involving the bilateral upper lobes with stable bullous disease is seen along the medial aspect of the left apex. Mild to moderate severity areas of atelectasis and/or infiltrate are seen within the posterior aspects of the bilateral upper lobes and left lung base. Moderate to marked severity posterior right basilar atelectasis and/or infiltrate is noted. A very small right pleural effusion is seen. No pneumothorax is identified. Upper Abdomen: There is stable hepatosplenomegaly with stable, moderate severity diffuse bilateral adrenal gland enlargement. Musculoskeletal: A chronic lateral eighth left rib fracture is seen. A chronic compression fracture deformity of the T12 vertebral body is noted. IMPRESSION: 1. Mild-to-moderate severity posterior bilateral upper lobe and left basilar atelectasis and/or infiltrate. 2. Moderate to marked severity posterior right basilar atelectasis and/or infiltrate. 3. Very small right pleural effusion. 4. Mild cardiomegaly. 5. Stable emphysematous lung disease. 6. Chronic lateral eighth left rib fracture. 7. Chronic compression fracture deformity of the T12 vertebral body. 8. Aortic atherosclerosis. Aortic Atherosclerosis (ICD10-I70.0) and Emphysema (ICD10-J43.9). Electronically Signed   By: Virgina Norfolk M.D.   On: 04/06/2020 15:33    Scheduled Meds: . vitamin C  1,000 mg Oral Daily  . cephALEXin  500 mg Oral Q8H  . fluticasone  1 spray Each Nare BID  .  gabapentin  600 mg Oral TID  . insulin aspart  0-5 Units Subcutaneous QHS  . insulin aspart  0-9 Units Subcutaneous TID WC  . ipratropium-albuterol  3 mL Nebulization Q4H  . levothyroxine  150 mcg Oral Q0600  . loratadine  10 mg Oral Daily  . losartan  50 mg Oral Daily  . nicotine  21 mg Transdermal Daily  . potassium chloride  40 mEq Oral BID  . predniSONE  50 mg Oral Q breakfast  . primidone  50 mg Oral BID  . rOPINIRole  0.5 mg Oral QHS  . rosuvastatin  5 mg Oral Daily  . sertraline  50 mg Oral Daily  . sodium chloride flush  3 mL Intravenous Q12H  . vitamin B-12  1,000 mcg Oral Daily   Continuous Infusions: . sodium chloride    . albumin human 25 g (04/07/20 1209)  . furosemide (LASIX) 200 mg in dextrose 5% 100 mL (2mg /mL) infusion 6 mg/hr (04/05/20 1635)     LOS: 4 days   Time spent: 35 minutes  Desma Maxim, MD [facility]  If 7PM-7AM, please contact night-coverage [email or web address]  04/07/2020, 1:35 PM

## 2020-04-07 NOTE — Progress Notes (Signed)
RT note:  Patient unable to maintain SpO2 above 90% per ordered goals.  SpO2 noted 82-88% during treatment.  Patient somnolent, but opens eyes to verbal and is able to follow commands appropriately.  Placed back on Bipap with settings per flowsheet.

## 2020-04-07 NOTE — Consult Note (Addendum)
WOC Nurse Consult Note: Pt was wearing Una boots prior to admission; removed Una boots to assess legs and re-apply compression wraps.   Legs are slowly improving with scant amt yellow drainage and decreased edema. Reason for Consult: Left posterior leg with full thickness wound where previous blister has ruptured; 4X4X.1cm, white and moist.  Patchy scattered areas of red moist partial thickness wounds; all are .8X.8X.1cm or smaller in size, painful to touch.  Right leg with patchy scattered areas of red moist partial thickness wounds; all are .8X.8X.1cm or smaller in size, painful to touch.  [employer] and coban to BLE.  Dressing procedure/placement/frequency: Topical treatment orders provided for bedside nurses as follows: Leave Una boots on to BLE; [place] nurses wil change Q Mon/Thurs while in the hospital. Pt will need to resume home health for dressing changes after discharge. Julien Girt MSN, RN, [place], [place], [place]

## 2020-04-07 NOTE — Plan of Care (Signed)

## 2020-04-07 NOTE — Progress Notes (Addendum)
[facility]  ROUNDING NOTE   Subjective:  Patient known to [place] from the office. She was recently noted as having significant edema along with proteinuria of at least 17 g. She was seen in the hospital today as she was unable to have renal biopsy performed given her underlying edema status as well as shortness of breath. Pure wick is in place and urine canister shows a significant amount of foam on top of her urine indicating significant proteinuria.   Update Patient is to be on furosemide infusion.  We will plan for biopsy tomorrow, unable to do it today.   Objective:  Vital signs in last 24 hours:  Temp:  [97.7 F (36.5 C)-98.7 F (37.1 C)] 98.1 F (36.7 C) (12/09 1204) Pulse Rate:  [89-105] 104 (12/09 1204) Resp:  [12-20] 20 (12/09 1204) BP: (114-187)/(55-101) 187/101 (12/09 1204) SpO2:  [86 %-100 %] 100 % (12/09 1204) FiO2 (%):  [60 %] 60 % (12/09 1204) Weight:  [74 kg] 74 kg (12/09 0500)  Weight change: -2.6 kg Filed Weights   04/05/20 0305 04/06/20 0403 04/07/20 0500  Weight: 80.2 kg 76.6 kg 74 kg    Intake/Output: I/O last 3 completed shifts: In: 917.6 [P.O.:600; I.V.:71.2; IV Piggyback:246.4] Out: 4300 [Urine:4300]   Intake/Output this shift:  No intake/output data recorded.  Physical Exam: General:  Appears pleasant and comfortable  Head:  Oral mucous membranes moist  Eyes:  Sclera and conjunctiva clear  Lungs:   Respirations even, unlabored, lungs with wheezes bilaterally, continues to require 4 L supplemental O2 via nasal cannula  Heart:  Regular rate and rhythm  Abdomen:   Soft, nontender, nondistended  Extremities:  Trace peripheral edema  Neurologic:  Oriented x3  Skin:  No acute lesions or rashes    Basic Metabolic Panel: Recent Labs  Lab 04/03/20 1141 04/04/20 0608 04/05/20 0546 04/06/20 0426 04/06/20 1253 04/07/20 0416  NA 132* 134* 135 134*  --  138  K 4.5 4.4 4.2 3.2*  --  3.6  CL 95* 94* 92* 86*  --  88*  CO2 28 29 32  36*  --  36*  GLUCOSE 156* 159* 147* 132*  --  90  BUN 16 16 17 17   --  25*  CREATININE 0.86 0.70 0.71 0.65  --  0.65  CALCIUM 8.1* 8.1* 8.1* 8.3*  --  8.7*  MG  --  1.7 2.2 1.8 1.7 2.1    Liver Function Tests: Recent Labs  Lab 04/03/20 1141  AST 19  ALT 11  ALKPHOS 89  BILITOT 0.4  PROT 6.7  ALBUMIN 2.7*   No results for input(s): LIPASE, AMYLASE in the last 168 hours. No results for input(s): AMMONIA in the last 168 hours.  CBC: Recent Labs  Lab 04/03/20 1141 04/04/20 0608 04/07/20 0416  WBC 10.7* 13.8* 8.6  NEUTROABS 9.4*  --   --   HGB 17.5* 16.4* 14.1  HCT 53.7* 51.7* 45.5  MCV 89.6 88.8 91.9  PLT 296 236 177    Cardiac Enzymes: No results for input(s): CKTOTAL, CKMB, CKMBINDEX, TROPONINI in the last 168 hours.  BNP: Invalid input(s): POCBNP  CBG: Recent Labs  Lab 04/06/20 1136 04/06/20 1629 04/06/20 2058 04/07/20 0818 04/07/20 1133  GLUCAP 149* 224* 149* 120* 251*    Microbiology: Results for orders placed or performed during the hospital encounter of 04/03/20  Resp Panel by RT-PCR (Flu A&B, Covid) Nasopharyngeal Swab     Status: None   Collection Time: 04/03/20 11:41 AM  Specimen: Nasopharyngeal Swab; Nasopharyngeal(NP) swabs in vial transport medium  Result Value Ref Range Status   SARS Coronavirus 2 by RT PCR NEGATIVE NEGATIVE Final    Comment: (NOTE) SARS-CoV-2 target nucleic acids are NOT DETECTED.  The SARS-CoV-2 RNA is generally detectable in upper respiratory specimens during the acute phase of infection. The lowest concentration of SARS-CoV-2 viral copies this assay can detect is 138 copies/mL. A negative result does not preclude SARS-Cov-2 infection and should not be used as the sole basis for treatment or other patient management decisions. A negative result may occur with  improper specimen collection/handling, submission of specimen other than nasopharyngeal swab, presence of viral mutation(s) within the areas targeted by this  assay, and inadequate number of viral copies(<138 copies/mL). A negative result must be combined with clinical observations, patient history, and epidemiological information. The expected result is Negative.  Fact Sheet for Patients:  EntrepreneurPulse.com.au  Fact Sheet for Healthcare Providers:  IncredibleEmployment.be  This test is no t yet approved or cleared by the [place] FDA and  has been authorized for detection and/or diagnosis of SARS-CoV-2 by FDA under an Emergency Use Authorization (EUA). This EUA will remain  in effect (meaning this test can be used) for the duration of the COVID-19 declaration under Section 564(b)(1) of the Act, 21 U.S.C.section 360bbb-3(b)(1), unless the authorization is terminated  or revoked sooner.       Influenza A by PCR NEGATIVE NEGATIVE Final   Influenza B by PCR NEGATIVE NEGATIVE Final    Comment: (NOTE) The Xpert Xpress SARS-CoV-2/FLU/RSV plus assay is intended as an aid in the diagnosis of influenza from Nasopharyngeal swab specimens and should not be used as a sole basis for treatment. Nasal washings and aspirates are unacceptable for Xpert Xpress SARS-CoV-2/FLU/RSV testing.  Fact Sheet for Patients: EntrepreneurPulse.com.au  Fact Sheet for Healthcare Providers: IncredibleEmployment.be  This test is not yet approved or cleared by the [place] FDA and has been authorized for detection and/or diagnosis of SARS-CoV-2 by FDA under an Emergency Use Authorization (EUA). This EUA will remain in effect (meaning this test can be used) for the duration of the COVID-19 declaration under Section 564(b)(1) of the Act, 21 U.S.C. section 360bbb-3(b)(1), unless the authorization is terminated or revoked.  Performed at [facility], [address] [place], [place] [address]   Culture, blood (Routine X 2) w Reflex to ID Panel     Status: None (Preliminary  result)   Collection Time: 04/03/20  3:27 PM   Specimen: BLOOD  Result Value Ref Range Status   Specimen Description BLOOD BLOOD RIGHT ARM  Final   Special Requests   Final    BOTTLES DRAWN AEROBIC AND ANAEROBIC Blood Culture adequate volume   Culture   Final    NO GROWTH 4 DAYS Performed at [facility], [address], [place], [place] [address]    Report Status PENDING  Incomplete  Urine Culture     Status: Abnormal   Collection Time: 04/03/20  4:00 PM   Specimen: Urine, Random  Result Value Ref Range Status   Specimen Description   Final    URINE, RANDOM Performed at [facility], [address], [place], [place] [address]    Special Requests   Final    NONE Performed at [facility], [address], [place], [place] [address]    Culture (A)  Final    >=100,000 COLONIES/mL ESCHERICHIA COLI >=100,000 COLONIES/mL AEROCOCCUS URINAE Standardized susceptibility testing for this organism is  not available. Performed at [facility], [place] [address] [place], [place] [address]    Report Status 04/07/2020 FINAL  Final   Organism ID, Bacteria ESCHERICHIA COLI (A)  Final      Susceptibility   Escherichia coli - MIC*    AMPICILLIN 4 SENSITIVE Sensitive     CEFAZOLIN <=4 SENSITIVE Sensitive     CEFEPIME <=0.12 SENSITIVE Sensitive     CEFTRIAXONE <=0.25 SENSITIVE Sensitive     CIPROFLOXACIN <=0.25 SENSITIVE Sensitive     GENTAMICIN <=1 SENSITIVE Sensitive     IMIPENEM <=0.25 SENSITIVE Sensitive     NITROFURANTOIN 32 SENSITIVE Sensitive     TRIMETH/SULFA <=20 SENSITIVE Sensitive     AMPICILLIN/SULBACTAM <=2 SENSITIVE Sensitive     PIP/TAZO <=4 SENSITIVE Sensitive     * >=100,000 COLONIES/mL ESCHERICHIA COLI  Culture, blood (Routine X 2) w Reflex to ID Panel     Status: None (Preliminary result)   Collection Time: 04/03/20 10:36 PM   Specimen: BLOOD  Result Value Ref Range Status   Specimen Description BLOOD BLOOD LEFT HAND  Final   Special  Requests   Final    BOTTLES DRAWN AEROBIC AND ANAEROBIC Blood Culture adequate volume   Culture   Final    NO GROWTH 4 DAYS Performed at [facility], [address] [place],  [address]    Report Status PENDING  Incomplete    Coagulation Studies: Recent Labs    04/07/20 0756  LABPROT 13.8  INR 1.1    Urinalysis: No results for input(s): COLORURINE, LABSPEC, PHURINE, GLUCOSEU, HGBUR, BILIRUBINUR, KETONESUR, PROTEINUR, UROBILINOGEN, NITRITE, LEUKOCYTESUR in the last 72 hours.  Invalid input(s): APPERANCEUR    Imaging: CT CHEST W CONTRAST  Result Date: 04/06/2020 CLINICAL DATA:  Hemoptysis. EXAM: CT CHEST WITH CONTRAST TECHNIQUE: Multidetector CT imaging of the chest was performed during intravenous contrast administration. CONTRAST:  42mL OMNIPAQUE IOHEXOL 300 MG/ML  SOLN COMPARISON:  January 06, 2019 FINDINGS: Cardiovascular: There is marked severity calcification of the aortic arch. There is mild cardiomegaly. No pericardial effusion. Mediastinum/Nodes: There is mild pretracheal and right hilar lymphadenopathy. Thyroid gland, trachea, and esophagus demonstrate no significant findings. Lungs/Pleura: Mild-to-moderate severity emphysematous lung disease is seen involving the bilateral upper lobes with stable bullous disease is seen along the medial aspect of the left apex. Mild to moderate severity areas of atelectasis and/or infiltrate are seen within the posterior aspects of the bilateral upper lobes and left lung base. Moderate to marked severity posterior right basilar atelectasis and/or infiltrate is noted. A very small right pleural effusion is seen. No pneumothorax is identified. Upper Abdomen: There is stable hepatosplenomegaly with stable, moderate severity diffuse bilateral adrenal gland enlargement. Musculoskeletal: A chronic lateral eighth left rib fracture is seen. A chronic compression fracture deformity of the T12 vertebral body is noted. IMPRESSION: 1.  Mild-to-moderate severity posterior bilateral upper lobe and left basilar atelectasis and/or infiltrate. 2. Moderate to marked severity posterior right basilar atelectasis and/or infiltrate. 3. Very small right pleural effusion. 4. Mild cardiomegaly. 5. Stable emphysematous lung disease. 6. Chronic lateral eighth left rib fracture. 7. Chronic compression fracture deformity of the T12 vertebral body. 8. Aortic atherosclerosis. Aortic Atherosclerosis (ICD10-I70.0) and Emphysema (ICD10-J43.9). Electronically Signed   By: Virgina Norfolk M.D.   On: 04/06/2020 15:33     Medications:   . sodium chloride    . albumin human 25 g (04/07/20 1209)  . furosemide (LASIX) 200 mg in dextrose 5% 100 mL (2mg /mL) infusion 6 mg/hr (04/05/20 1635)   .  vitamin C  1,000 mg Oral Daily  . cephALEXin  500 mg Oral Q8H  . fluticasone  1 spray Each Nare BID  . gabapentin  600 mg Oral TID  . insulin aspart  0-5 Units Subcutaneous QHS  . insulin aspart  0-9 Units Subcutaneous TID WC  . ipratropium-albuterol  3 mL Nebulization Q4H  . levothyroxine  150 mcg Oral Q0600  . loratadine  10 mg Oral Daily  . losartan  50 mg Oral Daily  . nicotine  21 mg Transdermal Daily  . potassium chloride  40 mEq Oral BID  . predniSONE  50 mg Oral Q breakfast  . primidone  50 mg Oral BID  . rOPINIRole  0.5 mg Oral QHS  . rosuvastatin  5 mg Oral Daily  . sertraline  50 mg Oral Daily  . sodium chloride flush  3 mL Intravenous Q12H  . vitamin B-12  1,000 mcg Oral Daily   sodium chloride, acetaminophen, albuterol, colchicine, dextromethorphan-guaiFENesin, fentaNYL (SUBLIMAZE) injection, fluticasone, hydrALAZINE, ondansetron (ZOFRAN) IV, oxyCODONE-acetaminophen, sodium chloride flush, tiZANidine  Assessment/ Plan:  74 y.o. female with past medical history of COPD, anxiety, hypertension, longstanding diabetes mellitus type 2, peripheral vascular disease, longstanding tobacco abuse who has nephrotic range proteinuria of 17  g.  #Proteinuria, 17 g.   # Nephrotic syndrome.   Renal biopsy tomorrow  N.p.o. post midnight tonight Continue furosemide infusion today  #Volume overload.   Volume status improving progressively We will continue diuresis with IV furosemide 6 mg/h  #Hypertension Blood pressure readings above goal Continue losartan  #Diabetes mellitus type 2 Lab Results  Component Value Date   HGBA1C 6.5 (H) 04/03/2020   Blood glucose readings within acceptable range Management per primary team   LOS: 4 Maryruth Apple 12/9/202112:22 PM

## 2020-04-07 NOTE — Progress Notes (Signed)
Inpatient Diabetes Program Recommendations  AACE/ADA: New Consensus Statement on Inpatient Glycemic Control (2015)  Target Ranges:  Prepandial:   less than 140 mg/dL      Peak postprandial:   less than 180 mg/dL (1-2 hours)      Critically ill patients:  140 - 180 mg/dL   Lab Results  Component Value Date   GLUCAP 251 (H) 04/07/2020   HGBA1C 6.5 (H) 04/03/2020    Review of Glycemic Control Results for HARLEE, ECKROTH (MRN [id number]) as of 04/07/2020 14:49  Ref. Range 04/06/2020 11:36 04/06/2020 16:29 04/06/2020 20:58 04/07/2020 08:18 04/07/2020 11:33  Glucose-Capillary Latest Ref Range: 70 - 99 mg/dL 149 (H) 224 (H) 149 (H) 120 (H) 251 (H)   Diabetes history: DM 2 Outpatient Diabetes medications:  Glucotrol 10 mg bid Metformin 500 mg bid Current orders for Inpatient glycemic control:  Novolog sensitive tid with meals and HS Prednisone 50 mg daily Inpatient Diabetes Program Recommendations:   May consider adding Novolog meal coverage 2 units tid with meals (hold if patient eats less than 50% or NPO).  Thanks  Adah Perl, RN, BC-ADM Inpatient Diabetes Coordinator Pager [phone] (8a-5p)

## 2020-04-07 NOTE — Progress Notes (Signed)
Renal biopsy scheduled for tomorrow.  Will hold lovenox and aspirin.

## 2020-04-08 ENCOUNTER — Inpatient Hospital Stay: Payer: Medicare Other

## 2020-04-08 LAB — BLOOD GAS, ARTERIAL
Acid-Base Excess: 24.1 mmol/L — ABNORMAL HIGH (ref 0.0–2.0)
Bicarbonate: 51.9 mmol/L — ABNORMAL HIGH (ref 20.0–28.0)
Delivery systems: POSITIVE
FIO2: 0.6
O2 Saturation: 96.7 %
Patient temperature: 37
RATE: 8 resp/min
pCO2 arterial: 65 mmHg — ABNORMAL HIGH (ref 32.0–48.0)
pH, Arterial: 7.51 — ABNORMAL HIGH (ref 7.350–7.450)
pO2, Arterial: 79 mmHg — ABNORMAL LOW (ref 83.0–108.0)

## 2020-04-08 LAB — CULTURE, BLOOD (ROUTINE X 2)
Culture: NO GROWTH
Culture: NO GROWTH
Special Requests: ADEQUATE
Special Requests: ADEQUATE

## 2020-04-08 LAB — BLOOD GAS, VENOUS
Acid-Base Excess: 24.6 mmol/L — ABNORMAL HIGH (ref 0.0–2.0)
Bicarbonate: 51.6 mmol/L — ABNORMAL HIGH (ref 20.0–28.0)
Delivery systems: POSITIVE
FIO2: 0.6
O2 Saturation: 93 %
Patient temperature: 37
pCO2, Ven: 59 mmHg (ref 44.0–60.0)
pH, Ven: 7.55 — ABNORMAL HIGH (ref 7.250–7.430)
pO2, Ven: 58 mmHg — ABNORMAL HIGH (ref 32.0–45.0)

## 2020-04-08 LAB — STREP PNEUMONIAE URINARY ANTIGEN: Strep Pneumo Urinary Antigen: NEGATIVE

## 2020-04-08 LAB — GLUCOSE, CAPILLARY
Glucose-Capillary: 185 mg/dL — ABNORMAL HIGH (ref 70–99)
Glucose-Capillary: 201 mg/dL — ABNORMAL HIGH (ref 70–99)
Glucose-Capillary: 214 mg/dL — ABNORMAL HIGH (ref 70–99)
Glucose-Capillary: 176 mg/dL — ABNORMAL HIGH (ref 70–99)

## 2020-04-08 LAB — URINALYSIS, COMPLETE (UACMP) WITH MICROSCOPIC
Bacteria, UA: NONE SEEN
Bilirubin Urine: NEGATIVE
Glucose, UA: NEGATIVE mg/dL
Hgb urine dipstick: NEGATIVE
Ketones, ur: 5 mg/dL — AB
Leukocytes,Ua: NEGATIVE
Nitrite: NEGATIVE
Protein, ur: >=300 mg/dL — AB
Specific Gravity, Urine: 1.014 (ref 1.005–1.030)
pH: 8 (ref 5.0–8.0)

## 2020-04-08 LAB — BASIC METABOLIC PANEL
Anion gap: 18 — ABNORMAL HIGH (ref 5–15)
BUN: 25 mg/dL — ABNORMAL HIGH (ref 8–23)
CO2: 37 mmol/L — ABNORMAL HIGH (ref 22–32)
Calcium: 9.6 mg/dL (ref 8.9–10.3)
Chloride: 84 mmol/L — ABNORMAL LOW (ref 98–111)
Creatinine, Ser: 0.61 mg/dL (ref 0.44–1.00)
GFR, Estimated: >60 mL/min (ref >60)
Glucose, Bld: 158 mg/dL — ABNORMAL HIGH (ref 70–99)
Potassium: 3.7 mmol/L (ref 3.5–5.1)
Sodium: 139 mmol/L (ref 135–145)

## 2020-04-08 LAB — TROPONIN I (HIGH SENSITIVITY)
Troponin I (High Sensitivity): 77 ng/L — ABNORMAL HIGH (ref <18)
Troponin I (High Sensitivity): 72 ng/L — ABNORMAL HIGH (ref <18)

## 2020-04-08 LAB — MAGNESIUM: Magnesium: 1.8 mg/dL (ref 1.7–2.4)

## 2020-04-08 LAB — BRAIN NATRIURETIC PEPTIDE: B Natriuretic Peptide: 2581.5 pg/mL — ABNORMAL HIGH (ref 0.0–100.0)

## 2020-04-08 LAB — PROCALCITONIN: Procalcitonin: <0.1 ng/mL

## 2020-04-08 LAB — AMMONIA: Ammonia: 34 umol/L (ref 9–35)

## 2020-04-08 LAB — MRSA PCR SCREENING: MRSA by PCR: NEGATIVE

## 2020-04-08 MED ORDER — METOPROLOL TARTRATE 5 MG/5ML IV SOLN
5.0000 mg | Freq: Four times a day (QID) | INTRAVENOUS | Status: DC | PRN
  Administered 2020-04-08 – 2020-04-10 (×4): 5 mg via INTRAVENOUS
  Filled 2020-04-08 (×4): qty 5

## 2020-04-08 MED ORDER — PIPERACILLIN-TAZOBACTAM 3.375 G IVPB
3.3750 g | Freq: Three times a day (TID) | INTRAVENOUS | Status: DC
  Administered 2020-04-08 – 2020-04-12 (×13): 3.375 g via INTRAVENOUS
  Filled 2020-04-08 (×13): qty 50

## 2020-04-08 MED ORDER — IOHEXOL 300 MG/ML  SOLN
100.0000 mL | Freq: Once | INTRAMUSCULAR | Status: AC | PRN
  Administered 2020-04-08: 75 mL via INTRAVENOUS

## 2020-04-08 MED ORDER — LORAZEPAM 2 MG/ML IJ SOLN
1.0000 mg | Freq: Once | INTRAMUSCULAR | Status: AC
  Administered 2020-04-08: 1 mg via INTRAVENOUS
  Filled 2020-04-08: qty 1

## 2020-04-08 MED ORDER — SERTRALINE HCL 50 MG PO TABS: 25.0000 mg | ORAL_TABLET | Freq: Every day | ORAL | Status: DC

## 2020-04-08 MED ORDER — GABAPENTIN 600 MG PO TABS
300.0000 mg | ORAL_TABLET | Freq: Three times a day (TID) | ORAL | Status: DC
  Filled 2020-04-08 (×3): qty 0.5

## 2020-04-08 MED ORDER — METHYLPREDNISOLONE SODIUM SUCC 40 MG IJ SOLR
20.0000 mg | Freq: Two times a day (BID) | INTRAMUSCULAR | Status: DC
  Administered 2020-04-08 – 2020-04-13 (×11): 20 mg via INTRAVENOUS
  Filled 2020-04-08 (×11): qty 1

## 2020-04-08 MED ORDER — IPRATROPIUM-ALBUTEROL 0.5-2.5 (3) MG/3ML IN SOLN
3.0000 mL | Freq: Three times a day (TID) | RESPIRATORY_TRACT | Status: DC
  Administered 2020-04-08 – 2020-04-21 (×37): 3 mL via RESPIRATORY_TRACT
  Filled 2020-04-08 (×36): qty 3

## 2020-04-08 MED ORDER — MAGNESIUM SULFATE 2 GM/50ML IV SOLN
2.0000 g | Freq: Once | INTRAVENOUS | Status: AC
  Administered 2020-04-08: 2 g via INTRAVENOUS
  Filled 2020-04-08: qty 50

## 2020-04-08 MED ORDER — ACETAMINOPHEN 650 MG RE SUPP
650.0000 mg | Freq: Four times a day (QID) | RECTAL | Status: DC | PRN
  Administered 2020-04-08: 650 mg via RECTAL
  Filled 2020-04-08 (×2): qty 1

## 2020-04-08 MED ORDER — NITROGLYCERIN 0.4 MG SL SUBL
0.4000 mg | SUBLINGUAL_TABLET | SUBLINGUAL | Status: DC | PRN
  Administered 2020-04-08: 0.4 mg via SUBLINGUAL
  Filled 2020-04-08: qty 1

## 2020-04-08 MED ORDER — VANCOMYCIN HCL 1750 MG/350ML IV SOLN
1750.0000 mg | Freq: Once | INTRAVENOUS | Status: DC
  Filled 2020-04-08: qty 350

## 2020-04-08 MED ORDER — AZITHROMYCIN 500 MG PO TABS
500.0000 mg | ORAL_TABLET | Freq: Every day | ORAL | Status: AC
  Filled 2020-04-08: qty 1

## 2020-04-08 MED ORDER — VANCOMYCIN HCL 750 MG/150ML IV SOLN
750.0000 mg | Freq: Two times a day (BID) | INTRAVENOUS | Status: DC
  Filled 2020-04-08: qty 150

## 2020-04-08 NOTE — Progress Notes (Signed)
Pt having difficultly maintaining O2 sat at 90% or above. Increased O2 to 12L HFNC. Won't keep Bipap mask on for longer than an 88min-hour. HR increasing to 120s, highest 140s. A few runs of Non-sustained VT per telemetry. Pt oriented to self, change from beginning of shift. Complaints of feeling "hot", although afebrile. Provider notified. ECG ordered. Will continue to monitor.

## 2020-04-08 NOTE — Progress Notes (Signed)
   04/08/20 0846  Assess: MEWS Score  Temp 99.5 F (37.5 C)  BP (!) 135/94  Pulse Rate (!) 114  Resp 20  SpO2 92 %  O2 Device HFNC  O2 Flow Rate (L/min) 10 L/min  Assess: MEWS Score  MEWS Temp 0  MEWS Systolic 0  MEWS Pulse 2  MEWS RR 0  MEWS LOC 1  MEWS Score 3  MEWS Score Color Yellow  Assess: if the MEWS score is Yellow or Red  Were vital signs taken at a resting state? Yes  Focused Assessment No change from prior assessment  Early Detection of Sepsis Score *See Row Information* Low  MEWS guidelines implemented *See Row Information* No, previously yellow, continue vital signs every 4 hours  Treat  MEWS Interventions Administered prn meds/treatments  Escalate  MEWS: Escalate Yellow: discuss with charge nurse/RN and consider discussing with provider and RRT  Notify: Charge Nurse/RN  Name of Charge Nurse/RN Notified Mammie Russian  Date Charge Nurse/RN Notified 04/08/20  Time Charge Nurse/RN Notified 1016  Document  Patient Outcome Other (Comment)  Progress note created (see row info) Yes

## 2020-04-08 NOTE — Progress Notes (Signed)
OT Cancellation Note  Patient Details Name: Martha [name] MRN: [id number] DOB: 1946-03-17   Cancelled Treatment:    Reason Eval/Treat Not Completed: Pain limiting ability to participate. Chart reviewed. Pt complaining of chest pain this am, also noted with elevated resting HR and BP. Will hold OT tx at this time and re-attempt at later date/time as pt is medically appropriate.   Jeni Salles, MPH, MS, OTR/L ascom [phone] 04/08/20, 9:15 AM

## 2020-04-08 NOTE — Care Management Important Message (Signed)
Important Message  Patient Details  Name: Martha [name] MRN: [id number] Date of Birth: [birth year]   Medicare Important Message Given:  Yes     Dannette Barbara 04/08/2020, 1:43 PM

## 2020-04-08 NOTE — Progress Notes (Signed)
   04/08/20 1753  Assess: MEWS Score  Temp 99.1 F (37.3 C)  BP (!) 188/88  Pulse Rate (!) 110  Resp 20  SpO2 95 %  O2 Device Bi-PAP  Assess: MEWS Score  MEWS Temp 0  MEWS Systolic 0  MEWS Pulse 1  MEWS RR 0  MEWS LOC 1  MEWS Score 2  MEWS Score Color Yellow  Assess: if the MEWS score is Yellow or Red  Were vital signs taken at a resting state? Yes  Focused Assessment No change from prior assessment  Early Detection of Sepsis Score *See Row Information* Low  MEWS guidelines implemented *See Row Information* No, previously yellow, continue vital signs every 4 hours  Treat  MEWS Interventions Other (Comment)  Document  Patient Outcome Other (Comment) (continue to assess and update MD wouk)  Progress note created (see row info) Yes

## 2020-04-08 NOTE — TOC Progression Note (Signed)
Transition of Care [facility]) - Progression Note    Patient Details  Name: Martha [name] MRN: [id number] Date of Birth: 1945/08/26  Transition of [facility]) CM/SW Contact  Eileen Stanford, LCSW Phone Number: 04/08/2020, 3:18 PM  Clinical Narrative:   CSW continuing to follow for d/c to SNF. Pt has chosen [facility] once stable for d/c.    Expected Discharge Plan: [place]    Expected Discharge Plan and Services Expected Discharge Plan: [place] arrangements for the past 2 months: Single Family Home                                       Social Determinants of Health (SDOH) Interventions    Readmission Risk Interventions Readmission Risk Prevention Plan 04/04/2020  Transportation Screening Complete  HRI or Home Care Consult Complete  Medication Review (RN Care Manager) Complete  Some recent data might be hidden

## 2020-04-08 NOTE — Progress Notes (Addendum)
[facility]  ROUNDING NOTE   Subjective:  Patient known to [place] from the office. She was recently noted as having significant edema along with proteinuria of at least 17 g. She was seen in the hospital today as she was unable to have renal biopsy performed given her underlying edema status as well as shortness of breath. Pure wick is in place and urine canister shows a significant amount of foam on top of her urine indicating significant proteinuria.   Update Patient seen getting transported to CT scan.She is currently in moderate respiratory distress.  We will postpone biopsy to Monday.    Objective:  Vital signs in last 24 hours:  Temp:  [97.9 F (36.6 C)-99.5 F (37.5 C)] 99 F (37.2 C) (12/10 1230) Pulse Rate:  [87-114] 87 (12/10 1230) Resp:  [14-22] 20 (12/10 1230) BP: (135-173)/(53-94) 139/53 (12/10 1230) SpO2:  [89 %-100 %] 100 % (12/10 1400) FiO2 (%):  [60 %] 60 % (12/10 1400) Weight:  [72.3 kg] 72.3 kg (12/10 0351)  Weight change: -1.651 kg Filed Weights   04/06/20 0403 04/07/20 0500 04/08/20 0351  Weight: 76.6 kg 74 kg 72.3 kg    Intake/Output: I/O last 3 completed shifts: In: 991.9 [P.O.:840; I.V.:98.8; IV Piggyback:53.1] Out: 3000 [Urine:3000]   Intake/Output this shift:  Total I/O In: 161.4 [I.V.:34.8; IV Piggyback:126.7] Out: -   Physical Exam: General:  Appears in moderate distress  Head:  Normocephalic, atraumatic   Eyes:  Anicteric  Lungs:   Respirations labored, accessory muscle use+, rhonchi bilaterally, diminished at the bases  Heart:  S1-S2, no rubs or gallops  Abdomen:   Soft, nontender, nondistended  Extremities:  No peripheral edema  Neurologic:  Lethargic, speech clear  Skin:  No acute lesions or rashes    Basic Metabolic Panel: Recent Labs  Lab 04/04/20 0608 04/05/20 0546 04/06/20 0426 04/06/20 1253 04/07/20 0416 04/08/20 0415  NA 134* 135 134*  --  138 139  K 4.4 4.2 3.2*  --  3.6 3.7  CL 94* 92* 86*  --  88* 84*   CO2 29 32 36*  --  36* 37*  GLUCOSE 159* 147* 132*  --  90 158*  BUN 16 17 17   --  25* 25*  CREATININE 0.70 0.71 0.65  --  0.65 0.61  CALCIUM 8.1* 8.1* 8.3*  --  8.7* 9.6  MG 1.7 2.2 1.8 1.7 2.1 1.8    Liver Function Tests: Recent Labs  Lab 04/03/20 1141  AST 19  ALT 11  ALKPHOS 89  BILITOT 0.4  PROT 6.7  ALBUMIN 2.7*   No results for input(s): LIPASE, AMYLASE in the last 168 hours. Recent Labs  Lab 04/08/20 1002  AMMONIA 34    CBC: Recent Labs  Lab 04/03/20 1141 04/04/20 0608 04/07/20 0416  WBC 10.7* 13.8* 8.6  NEUTROABS 9.4*  --   --   HGB 17.5* 16.4* 14.1  HCT 53.7* 51.7* 45.5  MCV 89.6 88.8 91.9  PLT 296 236 177    Cardiac Enzymes: No results for input(s): CKTOTAL, CKMB, CKMBINDEX, TROPONINI in the last 168 hours.  BNP: Invalid input(s): POCBNP  CBG: Recent Labs  Lab 04/07/20 1133 04/07/20 1647 04/07/20 2056 04/08/20 0752 04/08/20 1230  GLUCAP 251* 170* 155* 185* 201*    Microbiology: Results for orders placed or performed during the hospital encounter of 04/03/20  Resp Panel by RT-PCR (Flu A&B, Covid) Nasopharyngeal Swab     Status: None   Collection Time: 04/03/20 11:41 AM  Specimen: Nasopharyngeal Swab; Nasopharyngeal(NP) swabs in vial transport medium  Result Value Ref Range Status   SARS Coronavirus 2 by RT PCR NEGATIVE NEGATIVE Final    Comment: (NOTE) SARS-CoV-2 target nucleic acids are NOT DETECTED.  The SARS-CoV-2 RNA is generally detectable in upper respiratory specimens during the acute phase of infection. The lowest concentration of SARS-CoV-2 viral copies this assay can detect is 138 copies/mL. A negative result does not preclude SARS-Cov-2 infection and should not be used as the sole basis for treatment or other patient management decisions. A negative result may occur with  improper specimen collection/handling, submission of specimen other than nasopharyngeal swab, presence of viral mutation(s) within the areas targeted  by this assay, and inadequate number of viral copies(<138 copies/mL). A negative result must be combined with clinical observations, patient history, and epidemiological information. The expected result is Negative.  Fact Sheet for Patients:  EntrepreneurPulse.com.au  Fact Sheet for Healthcare Providers:  IncredibleEmployment.be  This test is no t yet approved or cleared by the [place] FDA and  has been authorized for detection and/or diagnosis of SARS-CoV-2 by FDA under an Emergency Use Authorization (EUA). This EUA will remain  in effect (meaning this test can be used) for the duration of the COVID-19 declaration under Section 564(b)(1) of the Act, 21 U.S.C.section 360bbb-3(b)(1), unless the authorization is terminated  or revoked sooner.       Influenza A by PCR NEGATIVE NEGATIVE Final   Influenza B by PCR NEGATIVE NEGATIVE Final    Comment: (NOTE) The Xpert Xpress SARS-CoV-2/FLU/RSV plus assay is intended as an aid in the diagnosis of influenza from Nasopharyngeal swab specimens and should not be used as a sole basis for treatment. Nasal washings and aspirates are unacceptable for Xpert Xpress SARS-CoV-2/FLU/RSV testing.  Fact Sheet for Patients: EntrepreneurPulse.com.au  Fact Sheet for Healthcare Providers: IncredibleEmployment.be  This test is not yet approved or cleared by the [place] FDA and has been authorized for detection and/or diagnosis of SARS-CoV-2 by FDA under an Emergency Use Authorization (EUA). This EUA will remain in effect (meaning this test can be used) for the duration of the COVID-19 declaration under Section 564(b)(1) of the Act, 21 U.S.C. section 360bbb-3(b)(1), unless the authorization is terminated or revoked.  Performed at [facility], [address] [place], [place] [address]   Culture, blood (Routine X 2) w Reflex to ID Panel     Status: None    Collection Time: 04/03/20  3:27 PM   Specimen: BLOOD  Result Value Ref Range Status   Specimen Description BLOOD BLOOD RIGHT ARM  Final   Special Requests   Final    BOTTLES DRAWN AEROBIC AND ANAEROBIC Blood Culture adequate volume   Culture   Final    NO GROWTH 5 DAYS Performed at [facility], [address], [place], [place] [address]    Report Status 04/08/2020 FINAL  Final  Urine Culture     Status: Abnormal   Collection Time: 04/03/20  4:00 PM   Specimen: Urine, Random  Result Value Ref Range Status   Specimen Description   Final    URINE, RANDOM Performed at [facility], [address] [place], [place] [address]    Special Requests   Final    NONE Performed at [facility], [address], [place], [place] [address]    Culture (A)  Final    >=100,000 COLONIES/mL ESCHERICHIA COLI >=100,000 COLONIES/mL AEROCOCCUS URINAE Standardized susceptibility testing for this organism is not  available. Performed at [facility], [place] [address] [place], [place] [address]    Report Status 04/07/2020 FINAL  Final   Organism ID, Bacteria ESCHERICHIA COLI (A)  Final      Susceptibility   Escherichia coli - MIC*    AMPICILLIN 4 SENSITIVE Sensitive     CEFAZOLIN <=4 SENSITIVE Sensitive     CEFEPIME <=0.12 SENSITIVE Sensitive     CEFTRIAXONE <=0.25 SENSITIVE Sensitive     CIPROFLOXACIN <=0.25 SENSITIVE Sensitive     GENTAMICIN <=1 SENSITIVE Sensitive     IMIPENEM <=0.25 SENSITIVE Sensitive     NITROFURANTOIN 32 SENSITIVE Sensitive     TRIMETH/SULFA <=20 SENSITIVE Sensitive     AMPICILLIN/SULBACTAM <=2 SENSITIVE Sensitive     PIP/TAZO <=4 SENSITIVE Sensitive     * >=100,000 COLONIES/mL ESCHERICHIA COLI  Culture, blood (Routine X 2) w Reflex to ID Panel     Status: None   Collection Time: 04/03/20 10:36 PM   Specimen: BLOOD  Result Value Ref Range Status   Specimen Description BLOOD BLOOD LEFT HAND  Final   Special Requests   Final    BOTTLES  DRAWN AEROBIC AND ANAEROBIC Blood Culture adequate volume   Culture   Final    NO GROWTH 5 DAYS Performed at [facility], [address] [place], [place] [address]    Report Status 04/08/2020 FINAL  Final  MRSA PCR Screening     Status: None   Collection Time: 04/08/20 10:04 AM   Specimen: Nasal Mucosa; Nasopharyngeal  Result Value Ref Range Status   MRSA by PCR NEGATIVE NEGATIVE Final    Comment:        The GeneXpert MRSA Assay (FDA approved for NASAL specimens only), is one component of a comprehensive MRSA colonization surveillance program. It is not intended to diagnose MRSA infection nor to guide or monitor treatment for MRSA infections. Performed at [facility], [address] [place], [place] [address]     Coagulation Studies: Recent Labs    04/07/20 0756  LABPROT 13.8  INR 1.1    Urinalysis: Recent Labs    04/08/20 0952  COLORURINE YELLOW*  LABSPEC 1.014  PHURINE 8.0  GLUCOSEU NEGATIVE  HGBUR NEGATIVE  BILIRUBINUR NEGATIVE  KETONESUR 5*  PROTEINUR >=300*  NITRITE NEGATIVE  LEUKOCYTESUR NEGATIVE      Imaging: CT ANGIO CHEST PE W OR WO CONTRAST  Result Date: 04/07/2020 CLINICAL DATA:  Shortness of breath EXAM: CT ANGIOGRAPHY CHEST WITH CONTRAST TECHNIQUE: Multidetector CT imaging of the chest was performed using the standard protocol during bolus administration of intravenous contrast. Multiplanar CT image reconstructions and MIPs were obtained to evaluate the vascular anatomy. CONTRAST:  75mL OMNIPAQUE IOHEXOL 350 MG/ML SOLN COMPARISON:  April 06, 2020 FINDINGS: Cardiovascular: There is a optimal opacification of the pulmonary arteries. There is no central,segmental, or subsegmental filling defects within the pulmonary arteries. There is unchanged cardiomegaly. Mitral valve calcifications and coronary artery calcifications are seen. No pericardial effusion or thickening. No evidence right heart strain. There is normal three-vessel  brachiocephalic anatomy without proximal stenosis. Scattered aortic atherosclerosis is noted. Mediastinum/Nodes: No hilar, mediastinal, or axillary adenopathy. Thyroid gland, trachea, and esophagus demonstrate no significant findings. Lungs/Pleura: Patchy airspace opacity seen at the left upper lung and posterior right lung base with air bronchograms. There are trace bilateral pleural effusions present. Centrilobular and paraseptal emphysematous changes seen at both lung apices. Upper Abdomen: No acute abnormalities present in the visualized portions of the upper abdomen. Musculoskeletal: No chest  wall abnormality. No acute or significant osseous findings. There is a chronic anterior wedge compression deformity of the T12 vertebral body with 50% loss in vertebral body height. Review of the MIP images confirms the above findings. IMPRESSION: No central, segmental, or subsegmental pulmonary embolism. Unchanged multifocal patchy airspace opacities within both lungs which could be due to multifocal pneumonia Trace bilateral pleural effusions, right greater than left. Aortic Atherosclerosis (ICD10-I70.0). Electronically Signed   By: Prudencio Pair M.D.   On: 04/07/2020 19:43     Medications:   . sodium chloride    . albumin human Stopped (04/08/20 0922)  . piperacillin-tazobactam (ZOSYN)  IV Stopped (04/08/20 1437)   . amLODipine  10 mg Oral Daily  . vitamin C  1,000 mg Oral Daily  . azithromycin  500 mg Oral Daily  . fluticasone  1 spray Each Nare BID  . gabapentin  600 mg Oral TID  . insulin aspart  0-5 Units Subcutaneous QHS  . insulin aspart  0-9 Units Subcutaneous TID WC  . ipratropium-albuterol  3 mL Nebulization TID  . levothyroxine  150 mcg Oral Q0600  . loratadine  10 mg Oral Daily  . losartan  50 mg Oral Daily  . methylPREDNISolone (SOLU-MEDROL) injection  20 mg Intravenous Q12H  . nicotine  21 mg Transdermal Daily  . potassium chloride  40 mEq Oral BID  . primidone  50 mg Oral BID  .  rOPINIRole  0.5 mg Oral QHS  . rosuvastatin  5 mg Oral Daily  . sertraline  50 mg Oral Daily  . sodium chloride flush  3 mL Intravenous Q12H  . vitamin B-12  1,000 mcg Oral Daily   sodium chloride, acetaminophen, albuterol, colchicine, dextromethorphan-guaiFENesin, fentaNYL (SUBLIMAZE) injection, fluticasone, hydrALAZINE, nitroGLYCERIN, ondansetron (ZOFRAN) IV, oxyCODONE-acetaminophen, sodium chloride flush, tiZANidine  Assessment/ Plan:  74 y.o. female with past medical history of COPD, anxiety, hypertension, longstanding diabetes mellitus type 2, peripheral vascular disease, longstanding tobacco abuse who has nephrotic range proteinuria of 17 g.  #Proteinuria, 17 g.   # Nephrotic syndrome.   Patient was scheduled for renal biopsy today We will reschedule for Monday, as patient appears to be in moderate respiratory distress  #Volume overload.   Peripheral edema improved significantly Discussed with attending Nephrologist, Dr.Lateef We will discontinue furosemide infusion for now,will reassess her again tomorrow  #Acute hypoxic respiratory failure  Appears in moderate distress today CT scan pending  #Hypertension BP 169/62 this morning continue losartan  #Diabetes mellitus type 2 Lab Results  Component Value Date   HGBA1C 6.5 (H) 04/03/2020   Continue insulin aspart   LOS: 5 [place] 12/10/20213:14 PM

## 2020-04-08 NOTE — Progress Notes (Signed)
Patient SBP 169. PRN Hydralazine given. MD Wouk notified. Patient complains of headache and leg pain 10/10. PRN Fentanyl given. Daughter at bedside. Per daughter patient is farther away from baseline each day this week. Patient was able to tell me her name, where she is and how many children she has. Unable to tell me birth date and year.

## 2020-04-08 NOTE — Progress Notes (Signed)
Pt moaning, and complaining of chest pain. Says 10/10. Given SL nitro. Will pass on to day shift.

## 2020-04-08 NOTE — Plan of Care (Signed)

## 2020-04-08 NOTE — Progress Notes (Addendum)
Pt has no diet order and is on BIPAP. Notify OUma NP. Will continue to monitor.  Update 2114: Pt HR sustaining at > 120 through 153 page Ouma and talked to her states will place order. Will continue to monitor.  Update 2151: Ouma states will place order. Will continue to monitor.  Update 2210: pt BP at 202/74 HR 108. Notify NP Ouma. Will continue to monitor.   Up[place] 0022: Per NP Ouma recheck after an hour. Will continue to monitor.  Update 0105: Pt BP at 201/95 HR 107. Notify NP Ouma and ordered to adminsitered PRN hydralazine. Will continue to monitor.  Update 0216: pt BP at 172/93 and HR 128 talked to NP Ouma ordered one time dose of 5 mg IV metropolol. Will continue to monitor.

## 2020-04-08 NOTE — Consult Note (Signed)
Chief Complaint: Patient was seen in consultation today for  Chief Complaint  Patient presents with  . Proteinuria, worsening CKD and severe proteinuria    Referring Physician(s): Dr. Holley Raring  Supervising Physician: Dr. Serafina Royals  Patient Status: [facility] - In-pt  History of Present Illness: Martha [name] is a 74 y.o. female with a medical history significant for HTN, DM, COPD, tobacco use, gout, depression/anxiety and chronic lower extremity edema/cellulitis.   The patient presented to the [facility] ED 04/03/20 for leg edema and worsening shortness of breath and was found with O2 saturations in the 80's. She was found to have a BNP of 1113 and imaging showed mild cardiomegaly and pulmonary edema. Her lab work also showed worsening proteinuria. She was admitted for further work up.    Interventional Radiology has been asked to evaluate this patient for an image-guided random renal biopsy for further evaluation of her worsening proteinuria. The patient was seen in IR 03/31/20 for an outpatient random renal biopsy (proteinuria work up) but was not an appropriate candidate at that time due to low oxygen levels and an inability to lay on her stomach for the procedure.   Past Medical History:  Diagnosis Date  . Abnormal LFTs (liver function tests)   . Anxiety   . Arthritis    knees, feet  . Asthma   . Chronic bronchitis ([place])   . Chronic constipation   . Chronic low back pain   . Cigarette smoker    Has cut back Smoking to 1 pack every other day  . COPD (chronic obstructive pulmonary disease) ([place])   . Depression   . Diabetic neuropathy ([place])   . Diverticulitis 2015   gi recommended repeat scope in 10 years  . Essential hypertension   . Fatty liver   . GERD (gastroesophageal reflux disease)   . Gout   . History of mammogram 05/28/2013  . Hyperlipidemia   . Hypothyroidism   . Insomnia   . Iron deficiency anemia   . Low serum vitamin D   . Personal history of tobacco use, presenting  hazards to health 09/29/2015  . Plantar fasciitis   . RLS (restless legs syndrome)   . Shortness of breath dyspnea   . Thyroid cancer ([place]) 2009  . Tremors of nervous system    "I think I have parkinson's disease"  . Type 2 diabetes mellitus ([place])   . Wears dentures    uppers    Past Surgical History:  Procedure Laterality Date  . CATARACT EXTRACTION W/PHACO Left 11/24/2015   Procedure: CATARACT EXTRACTION PHACO AND INTRAOCULAR LENS PLACEMENT (IOC);  Surgeon: Birder Robson, MD;  Location: [facility] ORS;  Service: Ophthalmology;  Laterality: Left;  [place] 38.3 AP% 20.6 CDE 7.89 FLUID PACK LOT # [id number]  . CATARACT EXTRACTION W/PHACO Right 06/23/2019   Procedure: CATARACT EXTRACTION PHACO AND INTRAOCULAR LENS PLACEMENT ([place]) RIGHT DIABETIC;  Surgeon: Birder Robson, MD;  Location: [place];  Service: Ophthalmology;  Laterality: Right;  Diabetic  . COLONOSCOPY  2004, 2009, 2015   Aden[place]  . POLYPECTOMY  2009  . THYROIDECTOMY  2009   states she was tx with radiactive iodine  . TUBAL LIGATION    . UPPER GASTROINTESTINAL ENDOSCOPY      Allergies: Nicotine polacrilex, Nicotrol [nicotine], and Codeine sulfate  Medications: Prior to Admission medications   Medication Sig Start Date End Date Taking? Authorizing Provider  alendronate (FOSAMAX) 70 MG tablet Take 70 mg by mouth once a week.    Yes [provider]  aspirin EC 81 MG tablet Take 81 mg by mouth daily.    Yes [provider]  cetirizine (ZYRTEC) 10 MG tablet Take 10 mg by mouth daily.    Yes [provider]  colchicine 0.6 MG tablet Take 0.6-1.2 mg by mouth See admin instructions. Take 2 tablets (1.2mg ) by mouth as needed for acute gout flare - take 1 additional tablet (0.6mg ) after 1 hour if needed for continued gout pain   Yes [provider]  ferrous sulfate 325 (65 FE) MG EC tablet Take 325 mg by mouth daily.  04/27/14 03/20/67 Yes [provider]  fluticasone (FLONASE) 50  MCG/ACT nasal spray Place 1-2 sprays into both nostrils daily as needed for allergies or rhinitis.    Yes [provider]  furosemide (LASIX) 40 MG tablet Take 40 mg by mouth daily.   Yes [provider]  gabapentin (NEURONTIN) 600 MG tablet Take 600 mg by mouth 3 (three) times daily.   Yes [provider]  glipiZIDE (GLUCOTROL) 10 MG tablet Take 10 mg by mouth 2 (two) times daily.  12/10/14  Yes [provider]  levothyroxine (SYNTHROID, LEVOTHROID) 150 MCG tablet Take 150 mcg by mouth daily before breakfast.  02/25/15  Yes [provider]  losartan (COZAAR) 50 MG tablet Take 50 mg by mouth daily.  09/30/18  Yes [provider]  metFORMIN (GLUCOPHAGE) 1000 MG tablet Take 500 mg by mouth 2 (two) times daily.    Yes [provider]  primidone (MYSOLINE) 50 MG tablet Take 50 mg by mouth 2 (two) times daily.    Yes [provider]  PROAIR HFA 108 (90 BASE) MCG/ACT inhaler Inhale 2 Inhalers into the lungs every 6 (six) hours as needed for wheezing or shortness of breath.  12/03/14  Yes [provider]  rOPINIRole (REQUIP) 0.5 MG tablet Take 0.5 mg by mouth at bedtime. 12/14/19  Yes [provider]  rosuvastatin (CRESTOR) 5 MG tablet Take 5 mg by mouth daily.    Yes [provider]  sertraline (ZOLOFT) 50 MG tablet Take 50 mg by mouth daily.    Yes [provider]  tiZANidine (ZANAFLEX) 2 MG tablet Take 2 mg by mouth 3 (three) times daily as needed for muscle spasms.    Yes [provider]  vitamin B-12 (CYANOCOBALAMIN) 1000 MCG tablet Take 1,000 mcg by mouth daily.   Yes [provider]  vitamin C (ASCORBIC ACID) 500 MG tablet Take 1,000 mg by mouth daily.    Yes [provider]  Vitamin D, Ergocalciferol, (DRISDOL) 50000 UNITS CAPS capsule Take 50,000 Units by mouth every Tuesday.    Yes [provider]     Family History  Problem Relation Age of Onset  . Diabetes  Mellitus II Father   . Lung disease Father   . Diabetes Mellitus II Mother   . Arthritis Mother   . Thyroid disease Other   . Asthma Sister   . Emphysema Sister   . Hypertension Sister   . Breast cancer Paternal Aunt     Social History   Socioeconomic History  . Marital status: Divorced    Spouse name: Not on file  . Number of children: Not on file  . Years of education: Not on file  . Highest education level: Not on file  Occupational History  . Not on file  Tobacco Use  . Smoking status: Current Every Day Smoker    Packs/day: 1.00  Years: 39.50    Pack years: 39.50    Types: Cigarettes  . Smokeless tobacco: Never Used  . Tobacco comment: curently smokes 12 cigerattes a day. I'm trying to cut back.  Vaping Use  . Vaping Use: Never used  Substance and Sexual Activity  . Alcohol use: No    Alcohol/week: 0.0 standard drinks  . Drug use: No  . Sexual activity: Never  Other Topics Concern  . Not on file  Social History Narrative  . Not on file   Social Determinants of Health   Financial Resource Strain: Not on file  Food Insecurity: Not on file  Transportation Needs: Not on file  Physical Activity: Not on file  Stress: Not on file  Social Connections: Not on file    Review of Systems: A 12 point ROS discussed and pertinent positives are indicated in the HPI above.  All other systems are negative.  Review of Systems  Unable to perform ROS: Acuity of condition    Vital Signs: BP (!) 135/94   Pulse (!) 114   Temp 99.5 F (37.5 C) (Oral)   Resp 20   Ht 5\' 4"  (1.626 m)   Wt 159 lb 8 oz (72.3 kg)   SpO2 92%   BMI 27.38 kg/m   Physical Exam Constitutional:      General: She is in acute distress.     Appearance: She is ill-appearing.     Comments: Patient did not open eyes or respond to voice or touch during my assessment.   HENT:     Mouth/Throat:     Mouth: Mucous membranes are dry.  Cardiovascular:     Rate and Rhythm: Tachycardia present.   Pulmonary:     Effort: Respiratory distress present.     Breath sounds: Decreased air movement present.     Comments: Moderate respiratory distress; bipap in place Skin:    Comments: Diffuse scabs, bruises, and red skin discoloration. Bilateral lower extremity edema/cellulitis. Both legs wrapped in [place] boots.   Neurological:     Mental Status: She is disoriented.     Imaging: DG Chest 2 View  Result Date: 04/03/2020 CLINICAL DATA:  Hypoxia. Leg edema and shortness of breath. Pt daughter states that pt is supposed to be getting a renal bx but they are not able to do it because of the edema in pts legs. Pt states that she does feel short of breath. Pt does not wear oxygen at home. Pt placed on O2 in triage due to sats being in the 80s. Hx of asthma, chronic bronchitis, COPD, thyroid cancer- 2009, DM, current smoker. EXAM: CHEST - 2 VIEW COMPARISON:  01/04/2018 FINDINGS: Cardiac silhouette is borderline enlarged. Stable aortic atherosclerotic calcifications. No mediastinal or hilar masses. No evidence of adenopathy. Lungs demonstrate diffuse bilateral irregular interstitial thickening which has increased compared to the prior study. There is also linear/reticular scarring in the left lung base, which is stable. No lung consolidation. No pleural effusion and no pneumothorax. Skeletal structures are demineralized, but grossly intact. IMPRESSION: 1. Bilateral irregular interstitial thickening, increased compared to the prior study, along with borderline cardiomegaly. Suspect mild congestive heart failure. Consider diffuse interstitial infection or inflammation in the proper clinical setting. No evidence of lobar pneumonia. Electronically Signed   By: Lajean Manes M.D.   On: 04/03/2020 13:06   CT CHEST W CONTRAST  Result Date: 04/06/2020 CLINICAL DATA:  Hemoptysis. EXAM: CT CHEST WITH CONTRAST TECHNIQUE: Multidetector CT imaging of the chest was performed during intravenous  contrast administration.  CONTRAST:  1mL OMNIPAQUE IOHEXOL 300 MG/ML  SOLN COMPARISON:  January 06, 2019 FINDINGS: Cardiovascular: There is marked severity calcification of the aortic arch. There is mild cardiomegaly. No pericardial effusion. Mediastinum/Nodes: There is mild pretracheal and right hilar lymphadenopathy. Thyroid gland, trachea, and esophagus demonstrate no significant findings. Lungs/Pleura: Mild-to-moderate severity emphysematous lung disease is seen involving the bilateral upper lobes with stable bullous disease is seen along the medial aspect of the left apex. Mild to moderate severity areas of atelectasis and/or infiltrate are seen within the posterior aspects of the bilateral upper lobes and left lung base. Moderate to marked severity posterior right basilar atelectasis and/or infiltrate is noted. A very small right pleural effusion is seen. No pneumothorax is identified. Upper Abdomen: There is stable hepatosplenomegaly with stable, moderate severity diffuse bilateral adrenal gland enlargement. Musculoskeletal: A chronic lateral eighth left rib fracture is seen. A chronic compression fracture deformity of the T12 vertebral body is noted. IMPRESSION: 1. Mild-to-moderate severity posterior bilateral upper lobe and left basilar atelectasis and/or infiltrate. 2. Moderate to marked severity posterior right basilar atelectasis and/or infiltrate. 3. Very small right pleural effusion. 4. Mild cardiomegaly. 5. Stable emphysematous lung disease. 6. Chronic lateral eighth left rib fracture. 7. Chronic compression fracture deformity of the T12 vertebral body. 8. Aortic atherosclerosis. Aortic Atherosclerosis (ICD10-I70.0) and Emphysema (ICD10-J43.9). Electronically Signed   By: Virgina Norfolk M.D.   On: 04/06/2020 15:33   CT ANGIO CHEST PE W OR WO CONTRAST  Result Date: 04/07/2020 CLINICAL DATA:  Shortness of breath EXAM: CT ANGIOGRAPHY CHEST WITH CONTRAST TECHNIQUE: Multidetector CT imaging of the chest was performed using  the standard protocol during bolus administration of intravenous contrast. Multiplanar CT image reconstructions and MIPs were obtained to evaluate the vascular anatomy. CONTRAST:  19mL OMNIPAQUE IOHEXOL 350 MG/ML SOLN COMPARISON:  April 06, 2020 FINDINGS: Cardiovascular: There is a optimal opacification of the pulmonary arteries. There is no central,segmental, or subsegmental filling defects within the pulmonary arteries. There is unchanged cardiomegaly. Mitral valve calcifications and coronary artery calcifications are seen. No pericardial effusion or thickening. No evidence right heart strain. There is normal three-vessel brachiocephalic anatomy without proximal stenosis. Scattered aortic atherosclerosis is noted. Mediastinum/Nodes: No hilar, mediastinal, or axillary adenopathy. Thyroid gland, trachea, and esophagus demonstrate no significant findings. Lungs/Pleura: Patchy airspace opacity seen at the left upper lung and posterior right lung base with air bronchograms. There are trace bilateral pleural effusions present. Centrilobular and paraseptal emphysematous changes seen at both lung apices. Upper Abdomen: No acute abnormalities present in the visualized portions of the upper abdomen. Musculoskeletal: No chest wall abnormality. No acute or significant osseous findings. There is a chronic anterior wedge compression deformity of the T12 vertebral body with 50% loss in vertebral body height. Review of the MIP images confirms the above findings. IMPRESSION: No central, segmental, or subsegmental pulmonary embolism. Unchanged multifocal patchy airspace opacities within both lungs which could be due to multifocal pneumonia Trace bilateral pleural effusions, right greater than left. Aortic Atherosclerosis (ICD10-I70.0). Electronically Signed   By: Prudencio Pair M.D.   On: 04/07/2020 19:43   US Venous Img Lower Bilateral (DVT)  Result Date: 04/03/2020 CLINICAL DATA:  Lower leg cellulitis EXAM: BILATERAL LOWER  EXTREMITY VENOUS DOPPLER ULTRASOUND TECHNIQUE: Gray-scale sonography with graded compression, as well as color Doppler and duplex ultrasound were performed to evaluate the lower extremity deep venous systems from the level of the common femoral vein and including the common femoral, femoral, profunda femoral, popliteal and calf veins including  the posterior tibial, peroneal and gastrocnemius veins when visible. The superficial great saphenous vein was also interrogated. Spectral Doppler was utilized to evaluate flow at rest and with distal augmentation maneuvers in the common femoral, femoral and popliteal veins. COMPARISON:  None. FINDINGS: RIGHT LOWER EXTREMITY Common Femoral Vein: No evidence of thrombus. Normal compressibility, respiratory phasicity and response to augmentation. Saphenofemoral Junction: No evidence of thrombus. Normal compressibility and flow on color Doppler imaging. Profunda Femoral Vein: No evidence of thrombus. Normal compressibility and flow on color Doppler imaging. Femoral Vein: No evidence of thrombus. Normal compressibility, respiratory phasicity and response to augmentation. Popliteal Vein: Not well visualized due to overlying edema. Calf Veins: Not well visualized due to overlying edema. Superficial Great Saphenous Vein: No evidence of thrombus. Normal compressibility. Venous Reflux:  None. Other Findings:  None. LEFT LOWER EXTREMITY Common Femoral Vein: No evidence of thrombus. Normal compressibility, respiratory phasicity and response to augmentation. Saphenofemoral Junction: No evidence of thrombus. Normal compressibility and flow on color Doppler imaging. Profunda Femoral Vein: No evidence of thrombus. Normal compressibility and flow on color Doppler imaging. Femoral Vein: No evidence of thrombus. Normal compressibility, respiratory phasicity and response to augmentation. Popliteal Vein: Not well visualized due to overlying edema. Calf Veins: Not well visualized due to overlying  edema. Superficial Great Saphenous Vein: No evidence of thrombus. Normal compressibility. Venous Reflux:  None. Other Findings:  None. IMPRESSION: Somewhat limited exam due to peripheral edema although no central deep venous thrombosis is noted. Electronically Signed   By: Inez Catalina M.D.   On: 04/03/2020 15:58   ECHOCARDIOGRAM COMPLETE  Result Date: 04/04/2020    ECHOCARDIOGRAM REPORT   Patient Name:   JAYD FORREY Date of Exam: 04/04/2020 Medical Rec #:  [id number]      Height:       64.0 in Accession #:    [id number]     Weight:       181.8 lb Date of Birth:  [birth year]     BSA:          1.879 m Patient Age:    43 years       BP:           171/78 mmHg Patient Gender: F              HR:           98 bpm. Exam Location:  [facility] Procedure: 2D Echo, Color Doppler, Cardiac Doppler and Intracardiac            Opacification Agent Indications:     I50.31 CHF-Acute Diastolic  History:         Patient has no prior history of Echocardiogram examinations.                  COPD, Signs/Symptoms:Shortness of Breath; Risk Factors:Diabetes                  and Current Smoker.  Sonographer:     Charmayne Sheer RDCS (AE) Referring Phys:  Baker Janus Soledad Gerlach NIU Diagnosing Phys: Bartholome Bill MD  Sonographer Comments: Technically difficult study due to poor echo windows. Image acquisition challenging due to patient body habitus and Image acquisition challenging due to COPD. IMPRESSIONS  1. Left ventricular ejection fraction, by estimation, is 55 to 60%. The left ventricle has normal function. The left ventricle has no regional wall motion abnormalities. Left ventricular diastolic parameters are consistent with Grade I diastolic dysfunction (impaired relaxation).  2. Right ventricular systolic function is normal. The  right ventricular size is mildly enlarged.  3. Left atrial size was mildly dilated.  4. Right atrial size was mildly dilated.  5. The mitral valve was not well visualized. Trivial mitral valve regurgitation.  6. The aortic valve  was not well visualized. Aortic valve regurgitation is trivial. FINDINGS  Left Ventricle: Left ventricular ejection fraction, by estimation, is 55 to 60%. The left ventricle has normal function. The left ventricle has no regional wall motion abnormalities. Definity contrast agent was given IV to delineate the left ventricular  endocardial borders. The left ventricular internal cavity size was normal in size. There is borderline left ventricular hypertrophy. Left ventricular diastolic parameters are consistent with Grade I diastolic dysfunction (impaired relaxation). Right Ventricle: The right ventricular size is mildly enlarged. No increase in right ventricular wall thickness. Right ventricular systolic function is normal. Left Atrium: Left atrial size was mildly dilated. Right Atrium: Right atrial size was mildly dilated. Pericardium: There is no evidence of pericardial effusion. Mitral Valve: The mitral valve was not well visualized. Trivial mitral valve regurgitation. MV peak gradient, 7.4 mmHg. The mean mitral valve gradient is 3.0 mmHg. Tricuspid Valve: The tricuspid valve is not well visualized. Tricuspid valve regurgitation is trivial. Aortic Valve: The aortic valve was not well visualized. Aortic valve regurgitation is trivial. Aortic valve mean gradient measures 3.0 mmHg. Aortic valve peak gradient measures 6.8 mmHg. Aortic valve area, by VTI measures 2.46 cm. Pulmonic Valve: The pulmonic valve was not well visualized. Pulmonic valve regurgitation is not visualized. Aorta: The aortic root was not well visualized. IAS/Shunts: The interatrial septum was not assessed.  LEFT VENTRICLE PLAX 2D LVIDd:         4.15 cm     Diastology LVIDs:         3.64 cm     LV e' medial:    5.98 cm/s LV PW:         1.43 cm     LV E/e' medial:  18.3 LV IVS:        1.12 cm     LV e' lateral:   6.53 cm/s LVOT diam:     2.00 cm     LV E/e' lateral: 16.8 LV SV:         55 LV SV Index:   29 LVOT Area:     3.14 cm  LV Volumes (MOD)  LV vol d, MOD A2C: 55.5 ml LV vol d, MOD A4C: 63.8 ml LV vol s, MOD A2C: 34.3 ml LV vol s, MOD A4C: 23.4 ml LV SV MOD A2C:     21.2 ml LV SV MOD A4C:     63.8 ml LV SV MOD BP:      34.2 ml RIGHT VENTRICLE RV Basal diam:  3.76 cm LEFT ATRIUM             Index       RIGHT ATRIUM           Index LA diam:        4.90 cm 2.61 cm/m  RA Area:     19.80 cm LA Vol (A2C):   59.5 ml 31.67 ml/m RA Volume:   61.10 ml  32.52 ml/m LA Vol (A4C):   57.5 ml 30.61 ml/m LA Biplane Vol: 61.2 ml 32.58 ml/m  AORTIC VALVE                   PULMONIC VALVE AV Area (Vmax):    2.51 cm    PV Vmax:  0.76 m/s AV Area (Vmean):   2.43 cm    PV Vmean:      53.200 cm/s AV Area (VTI):     2.46 cm    PV VTI:        0.126 m AV Vmax:           130.00 cm/s PV Peak grad:  2.3 mmHg AV Vmean:          83.500 cm/s PV Mean grad:  1.0 mmHg AV VTI:            0.225 m AV Peak Grad:      6.8 mmHg AV Mean Grad:      3.0 mmHg LVOT Vmax:         104.00 cm/s LVOT Vmean:        64.700 cm/s LVOT VTI:          0.176 m LVOT/AV VTI ratio: 0.78  AORTA Ao Root diam: 3.10 cm MITRAL VALVE                TRICUSPID VALVE MV Area (PHT): 3.00 cm     TR Peak grad:   28.9 mmHg MV Peak grad:  7.4 mmHg     TR Vmax:        269.00 cm/s MV Mean grad:  3.0 mmHg MV Vmax:       1.36 m/s     SHUNTS MV Vmean:      86.9 cm/s    Systemic VTI:  0.18 m MV Decel Time: 253 msec     Systemic Diam: 2.00 cm MV E velocity: 109.50 cm/s MV A velocity: 137.50 cm/s MV E/A ratio:  0.80 Bartholome Bill MD Electronically signed by Bartholome Bill MD Signature Date/Time: 04/04/2020/4:54:02 PM    Final     Labs:  CBC: Recent Labs    03/31/20 0900 04/03/20 1141 04/04/20 0608 04/07/20 0416  WBC 12.7* 10.7* 13.8* 8.6  HGB 16.9* 17.5* 16.4* 14.1  HCT 52.6* 53.7* 51.7* 45.5  PLT 246 296 236 177    COAGS: Recent Labs    03/31/20 0900 04/04/20 0608 04/07/20 0756  INR 1.0 1.0 1.1    BMP: Recent Labs    04/05/20 0546 04/06/20 0426 04/07/20 0416 04/08/20 0415  NA 135 134* 138 139   K 4.2 3.2* 3.6 3.7  CL 92* 86* 88* 84*  CO2 32 36* 36* 37*  GLUCOSE 147* 132* 90 158*  BUN 17 17 25* 25*  CALCIUM 8.1* 8.3* 8.7* 9.6  CREATININE 0.71 0.65 0.65 0.61  GFRNONAA >60 >60 >60 >60    LIVER FUNCTION TESTS: Recent Labs    04/03/20 1141  BILITOT 0.4  AST 19  ALT 11  ALKPHOS 89  PROT 6.7  ALBUMIN 2.7*    TUMOR MARKERS: No results for input(s): AFPTM, CEA, CA199, CHROMGRNA in the last 8760 hours.  Assessment and Plan:  Proteinuria; request for image-guided random renal biopsy: Martha [name], 74 year old female, remains a poor candidate for a renal biopsy in IR. Her respiratory status and overall clinical picture has worsened since she was last seen in IR and she cannot safely be sedated. She also would likely not tolerate a bleeding complication from the biopsy, should one occur. If the patient stabilizes IR would possibly consider a renal biopsy with general anesthesia.   No IR procedure planned.  Please call IR with any questions.  Thank you for this interesting consult.  I greatly enjoyed meeting Martha [name] and look forward to participating  in their care.  A copy of this report was sent to the requesting provider on this date.  Electronically Signed: Soyla Dryer, AGACNP-BC [phone] 04/08/2020, 11:44 AM   I spent a total of 20 Minutes    in face to face in clinical consultation, greater than 50% of which was counseling/coordinating care for image-guided random renal biopsy.

## 2020-04-08 NOTE — Consult Note (Signed)
Pharmacy Antibiotic Note  ALICE BURNSIDE is a 74 y.o. female admitted on 04/03/2020 with pneumonia.  Pharmacy has been consulted for pip/tazo and vancomcyin dosing. Pt received ceftriaxone and keflex for a total of 5 days for cellulitis and UTI. Pt is on HFNC 10 L/min, afeb, procal < 0.1, WBC 8.6, Chest CT negative for PE and showed unchanged multifocal patchy airspace opacities within both lungs which could be due to multifocal pneumonia.   Plan: Pip/tazo 3.375 g q8H EI   Will give vancomycin 1750 mg x 1 loading dose followed by vancomycin 750 mg q12H per [place] nomogram. MRSA PCR ordered. If negative, recommend to d/c vancomycin as negative predictive value is 99.2 for MRSA PNA. Goal trough 15-20. Plan to order levels in 2-3 days if vancomycin is continued.   Azithromycin 500 mg x 3 doses.    Height: 5\' 4"  (162.6 cm) Weight: 72.3 kg (159 lb 8 oz) IBW/kg (Calculated) : 54.7  Temp (24hrs), Avg:98.7 F (37.1 C), Min:97.9 F (36.6 C), Max:99.5 F (37.5 C)  Recent Labs  Lab 04/03/20 1141 04/04/20 0608 04/05/20 0546 04/06/20 0426 04/07/20 0416 04/08/20 0415  WBC 10.7* 13.8*  --   --  8.6  --   CREATININE 0.86 0.70 0.71 0.65 0.65 0.61    Estimated Creatinine Clearance: 59.2 mL/min (by C-G formula based on SCr of 0.61 mg/dL).    Allergies  Allergen Reactions  . Nicotine Polacrilex Cough    Onset 04/29/2006.  Marland Kitchen Nicotrol [Nicotine] Cough  . Codeine Sulfate Itching    Onset 11/24/1998. tingling    Antimicrobials this admission: Ceftriaxone + keflex  12/5 >> 12/9 12/10 pip/tazo >>  12/10 vancomycin >>   Dose adjustments this admission: None  Microbiology results: 12/5 BCx: NGTD 12/5 UCx:  >=100,000 COLONIES/mL ESCHERICHIA COLI  >=100,000 COLONIES/mL AEROCOCCUS URINAE  12/5 MRSA PCR: ordered.   Thank you for allowing pharmacy to be a part of this patient's care.  Oswald Hillock 04/08/2020 10:07 AM

## 2020-04-08 NOTE — Consult Note (Signed)
Pulmonary Medicine          Date: 04/08/2020,   MRN# [id number] Martha [name] Feb 16, 1946     AdmissionWeight: 72 kg                 CurrentWeight: 72.3 kg  Referring physician: Dr. Si Raider    CHIEF COMPLAINT:   Acute on chronic hypoxemic respiratory failure   HISTORY OF PRESENT ILLNESS   74 year old female with history of diabetes COPD lifelong smoking hypothyroidism gout major depressive disorder, anxiety disorder and GERD, diverticulitis who came in with signs and symptoms of anasarca including peripheral edema and pulmonary edema.  Patient states is been worse than usual despite compliance with wrapping the lower extremities.  She also reported coughing and dyspnea.  Upon presentation to the emergency room she was found to be desaturating in the mid 80s and was placed on 4 L supplemental oxygen with some subsequent improvement of SPO2.  She was then treated with BiPAP and felt better.  She was initially admitted with acute exacerbation of CHF and cardiology was consulted for this.  She was also found to have proteinuria with CKD and had renal consultation placed. Pulmonary consultation placed for acute on chronic hypoxemic respiratory failure with failure to wean down oxygen.  Patient did have CT PE protocol done to rule out acute pulmonary venous embolism which was negative however did show bullous emphysema with overlying pulmonary edema as well as a right lower lobe consolidated infiltrate suggestive of pneumonia as well as right hemidiaphragm elevation due to hepatomegaly.  PAST MEDICAL HISTORY   Past Medical History:  Diagnosis Date  . Abnormal LFTs (liver function tests)   . Anxiety   . Arthritis    knees, feet  . Asthma   . Chronic bronchitis ([place])   . Chronic constipation   . Chronic low back pain   . Cigarette smoker    Has cut back Smoking to 1 pack every other day  . COPD (chronic obstructive pulmonary disease) ([place])   . Depression   . Diabetic  neuropathy ([place])   . Diverticulitis 2015   gi recommended repeat scope in 10 years  . Essential hypertension   . Fatty liver   . GERD (gastroesophageal reflux disease)   . Gout   . History of mammogram 05/28/2013  . Hyperlipidemia   . Hypothyroidism   . Insomnia   . Iron deficiency anemia   . Low serum vitamin D   . Personal history of tobacco use, presenting hazards to health 09/29/2015  . Plantar fasciitis   . RLS (restless legs syndrome)   . Shortness of breath dyspnea   . Thyroid cancer ([place]) 2009  . Tremors of nervous system    "I think I have parkinson's disease"  . Type 2 diabetes mellitus ([place])   . Wears dentures    uppers     SURGICAL HISTORY   Past Surgical History:  Procedure Laterality Date  . CATARACT EXTRACTION W/PHACO Left 11/24/2015   Procedure: CATARACT EXTRACTION PHACO AND INTRAOCULAR LENS PLACEMENT (IOC);  Surgeon: Birder Robson, MD;  Location: [facility];  Service: Ophthalmology;  Laterality: Left;  [place] 38.3 AP% 20.6 CDE 7.89 FLUID PACK LOT # [id number]  . CATARACT EXTRACTION W/PHACO Right 06/23/2019   Procedure: CATARACT EXTRACTION PHACO AND INTRAOCULAR LENS PLACEMENT ([place]) RIGHT DIABETIC;  Surgeon: Birder Robson, MD;  Location: [place];  Service: Ophthalmology;  Laterality: Right;  Diabetic  . COLONOSCOPY  2004, 2009, 2015   [place]  .  POLYPECTOMY  2009  . THYROIDECTOMY  2009   states she was tx with radiactive iodine  . TUBAL LIGATION    . UPPER GASTROINTESTINAL ENDOSCOPY       FAMILY HISTORY   Family History  Problem Relation Age of Onset  . Diabetes Mellitus II Father   . Lung disease Father   . Diabetes Mellitus II Mother   . Arthritis Mother   . Thyroid disease Other   . Asthma Sister   . Emphysema Sister   . Hypertension Sister   . Breast cancer Paternal Aunt      SOCIAL HISTORY   Social History   Tobacco Use  . Smoking status: Current Every Day Smoker    Packs/day: 1.00    Years: 39.50    Pack years: 39.50     Types: Cigarettes  . Smokeless tobacco: Never Used  . Tobacco comment: curently smokes 12 cigerattes a day. I'm trying to cut back.  Vaping Use  . Vaping Use: Never used  Substance Use Topics  . Alcohol use: No    Alcohol/week: 0.0 standard drinks  . Drug use: No     MEDICATIONS    Home Medication:    Current Medication:  Current Facility-Administered Medications:  .  0.9 %  sodium chloride infusion, 250 mL, Intravenous, PRN, Ivor Costa, MD .  acetaminophen (TYLENOL) tablet 650 mg, 650 mg, Oral, Q6H PRN, Lorella Nimrod, MD, 650 mg at 04/06/20 1638 .  albumin human 25 % solution 25 g, 25 g, Intravenous, Q8H, Hall, Scott A, RPH, Last Rate: 120 mL/hr at 04/08/20 0839, 25 g at 04/08/20 0839 .  albuterol (PROVENTIL) (2.5 MG/3ML) 0.083% nebulizer solution 2.5 mg, 2.5 mg, Nebulization, Q4H PRN, Ivor Costa, MD .  amLODipine (NORVASC) tablet 10 mg, 10 mg, Oral, Daily, Wouk, Ailene Rud, MD, 10 mg at 04/08/20 0865 .  ascorbic acid (VITAMIN C) tablet 1,000 mg, 1,000 mg, Oral, Daily, Ivor Costa, MD, 1,000 mg at 04/07/20 0824 .  azithromycin (ZITHROMAX) tablet 500 mg, 500 mg, Oral, Daily, Wouk, Ailene Rud, MD .  colchicine tablet 0.6 mg, 0.6 mg, Oral, BID PRN, Ivor Costa, MD .  dextromethorphan-guaiFENesin [facility] DM) 30-600 MG per 12 hr tablet 1 tablet, 1 tablet, Oral, BID PRN, Ivor Costa, MD, 1 tablet at 04/06/20 0945 .  fentaNYL (SUBLIMAZE) injection 12.5 mcg, 12.5 mcg, Intravenous, Q2H PRN, Sharion Settler, NP, 12.5 mcg at 04/08/20 0851 .  fluticasone (FLONASE) 50 MCG/ACT nasal spray 1 spray, 1 spray, Each Nare, BID, Sharion Settler, NP, 1 spray at 04/07/20 2122 .  fluticasone (FLONASE) 50 MCG/ACT nasal spray 1-2 spray, 1-2 spray, Each Nare, Daily PRN, Ivor Costa, MD .  furosemide (LASIX) 200 mg in dextrose 5 % 100 mL (2 mg/mL) infusion, 6 mg/hr, Intravenous, Continuous, Lateef, Munsoor, MD, Last Rate: 3 mL/hr at 04/08/20 1012, 6 mg/hr at 04/08/20 1012 .  gabapentin (NEURONTIN) tablet  600 mg, 600 mg, Oral, TID, Ivor Costa, MD, 600 mg at 04/07/20 0825 .  hydrALAZINE (APRESOLINE) injection 10 mg, 10 mg, Intravenous, Q2H PRN, Si Raider, Ailene Rud, MD, 10 mg at 04/08/20 0826 .  insulin aspart (novoLOG) injection 0-5 Units, 0-5 Units, Subcutaneous, QHS, Ivor Costa, MD, 3 Units at 04/04/20 2115 .  insulin aspart (novoLOG) injection 0-9 Units, 0-9 Units, Subcutaneous, TID WC, Ivor Costa, MD, 2 Units at 04/08/20 [id number] .  ipratropium-albuterol (DUONEB) 0.5-2.5 (3) MG/3ML nebulizer solution 3 mL, 3 mL, Nebulization, Q4H, Ivor Costa, MD, 3 mL at 04/08/20 0752 .  levothyroxine (SYNTHROID) tablet 150  mcg, 150 mcg, Oral, Q0600, Ivor Costa, MD, 150 mcg at 04/08/20 0616 .  loratadine (CLARITIN) tablet 10 mg, 10 mg, Oral, Daily, Ivor Costa, MD, 10 mg at 04/07/20 0824 .  LORazepam (ATIVAN) injection 1 mg, 1 mg, Intravenous, Once, Wouk, Ailene Rud, MD .  losartan (COZAAR) tablet 50 mg, 50 mg, Oral, Daily, Ivor Costa, MD, 50 mg at 04/06/20 0949 .  nicotine (NICODERM CQ - dosed in mg/24 hours) patch 21 mg, 21 mg, Transdermal, Daily, Lateef, Munsoor, MD, 21 mg at 04/08/20 0843 .  nitroGLYCERIN (NITROSTAT) SL tablet 0.4 mg, 0.4 mg, Sublingual, Q5 min PRN, Wouk, Ailene Rud, MD, 0.4 mg at 04/08/20 0659 .  ondansetron (ZOFRAN) injection 4 mg, 4 mg, Intravenous, Q8H PRN, Ivor Costa, MD, 4 mg at 04/06/20 1543 .  oxyCODONE-acetaminophen (PERCOCET/ROXICET) 5-325 MG per tablet 1 tablet, 1 tablet, Oral, Q6H PRN, Ivor Costa, MD, 1 tablet at 04/06/20 2157 .  piperacillin-tazobactam (ZOSYN) IVPB 3.375 g, 3.375 g, Intravenous, Q8H, Wouk, Ailene Rud, MD, Last Rate: 12.5 mL/hr at 04/08/20 1207, Restarted at 04/08/20 1207 .  potassium chloride SA (KLOR-CON) CR tablet 40 mEq, 40 mEq, Oral, BID, Wouk, Ailene Rud, MD, 40 mEq at 04/08/20 3267 .  predniSONE (DELTASONE) tablet 50 mg, 50 mg, Oral, Q breakfast, Lorella Nimrod, MD, 50 mg at 04/07/20 0826 .  primidone (MYSOLINE) tablet 50 mg, 50 mg, Oral, BID, Ivor Costa,  MD, 50 mg at 04/07/20 2121 .  rOPINIRole (REQUIP) tablet 0.5 mg, 0.5 mg, Oral, QHS, Ivor Costa, MD, 0.5 mg at 04/07/20 2120 .  rosuvastatin (CRESTOR) tablet 5 mg, 5 mg, Oral, Daily, Ivor Costa, MD, 5 mg at 04/07/20 0825 .  sertraline (ZOLOFT) tablet 50 mg, 50 mg, Oral, Daily, Ivor Costa, MD, 50 mg at 04/08/20 1245 .  sodium chloride flush (NS) 0.9 % injection 3 mL, 3 mL, Intravenous, Q12H, Ivor Costa, MD, 3 mL at 04/07/20 2121 .  sodium chloride flush (NS) 0.9 % injection 3 mL, 3 mL, Intravenous, PRN, Ivor Costa, MD .  tiZANidine (ZANAFLEX) tablet 2 mg, 2 mg, Oral, TID PRN, Ivor Costa, MD .  vancomycin (VANCOREADY) IVPB 1750 mg/350 mL, 1,750 mg, Intravenous, Once, Wouk, Ailene Rud, MD .  vancomycin (VANCOREADY) IVPB 750 mg/150 mL, 750 mg, Intravenous, Q12H, Wouk, Ailene Rud, MD .  vitamin B-12 (CYANOCOBALAMIN) tablet 1,000 mcg, 1,000 mcg, Oral, Daily, Ivor Costa, MD, 1,000 mcg at 04/07/20 0825    ALLERGIES   Nicotine polacrilex, Nicotrol [nicotine], and Codeine sulfate     REVIEW OF SYSTEMS    Review of Systems:  Unable to obtain due to encephalopathy   VS: BP (!) 135/94   Pulse (!) 114   Temp 99.5 F (37.5 C) (Oral)   Resp 20   Ht 5\' 4"  (1.626 m)   Wt 72.3 kg   SpO2 92%   BMI 27.38 kg/m      PHYSICAL EXAM    GENERAL:NAD, no fevers, chills, no weakness no fatigue HEAD: Normocephalic, atraumatic.  EYES: Pupils equal, round, reactive to light. Extraocular muscles intact. No scleral icterus.  MOUTH: Moist mucosal membrane. Dentition intact. No abscess noted. On BIPAP EAR, NOSE, THROAT: Clear without exudates. No external lesions.  NECK: Supple. No thyromegaly. No nodules. No JVD.  PULMONARY: mild rhonchi bilaterally  CARDIOVASCULAR: S1 and S2. Regular rate and rhythm. No murmurs, rubs, or gallops. No edema. Pedal pulses 2+ bilaterally.  GASTROINTESTINAL: Soft, nontender, nondistended. No masses. Positive bowel sounds. No hepatosplenomegaly.  MUSCULOSKELETAL: No  swelling, clubbing, +edema 3++.  NEUROLOGIC:  GCS 9 on BIPAP SKIN: No ulceration, lesions, rashes, or cyanosis. Skin warm and dry. Turgor intact.  PSYCHIATRIC: Mood, affect within normal limits. The patient is awake, alert and oriented x 3. Insight, judgment intact.       IMAGING    DG Chest 2 View  Result Date: 04/03/2020 CLINICAL DATA:  Hypoxia. Leg edema and shortness of breath. Pt daughter states that pt is supposed to be getting a renal bx but they are not able to do it because of the edema in pts legs. Pt states that she does feel short of breath. Pt does not wear oxygen at home. Pt placed on O2 in triage due to sats being in the 80s. Hx of asthma, chronic bronchitis, COPD, thyroid cancer- 2009, DM, current smoker. EXAM: CHEST - 2 VIEW COMPARISON:  01/04/2018 FINDINGS: Cardiac silhouette is borderline enlarged. Stable aortic atherosclerotic calcifications. No mediastinal or hilar masses. No evidence of adenopathy. Lungs demonstrate diffuse bilateral irregular interstitial thickening which has increased compared to the prior study. There is also linear/reticular scarring in the left lung base, which is stable. No lung consolidation. No pleural effusion and no pneumothorax. Skeletal structures are demineralized, but grossly intact. IMPRESSION: 1. Bilateral irregular interstitial thickening, increased compared to the prior study, along with borderline cardiomegaly. Suspect mild congestive heart failure. Consider diffuse interstitial infection or inflammation in the proper clinical setting. No evidence of lobar pneumonia. Electronically Signed   By: Lajean Manes M.D.   On: 04/03/2020 13:06   CT CHEST W CONTRAST  Result Date: 04/06/2020 CLINICAL DATA:  Hemoptysis. EXAM: CT CHEST WITH CONTRAST TECHNIQUE: Multidetector CT imaging of the chest was performed during intravenous contrast administration. CONTRAST:  68mL OMNIPAQUE IOHEXOL 300 MG/ML  SOLN COMPARISON:  January 06, 2019 FINDINGS:  Cardiovascular: There is marked severity calcification of the aortic arch. There is mild cardiomegaly. No pericardial effusion. Mediastinum/Nodes: There is mild pretracheal and right hilar lymphadenopathy. Thyroid gland, trachea, and esophagus demonstrate no significant findings. Lungs/Pleura: Mild-to-moderate severity emphysematous lung disease is seen involving the bilateral upper lobes with stable bullous disease is seen along the medial aspect of the left apex. Mild to moderate severity areas of atelectasis and/or infiltrate are seen within the posterior aspects of the bilateral upper lobes and left lung base. Moderate to marked severity posterior right basilar atelectasis and/or infiltrate is noted. A very small right pleural effusion is seen. No pneumothorax is identified. Upper Abdomen: There is stable hepatosplenomegaly with stable, moderate severity diffuse bilateral adrenal gland enlargement. Musculoskeletal: A chronic lateral eighth left rib fracture is seen. A chronic compression fracture deformity of the T12 vertebral body is noted. IMPRESSION: 1. Mild-to-moderate severity posterior bilateral upper lobe and left basilar atelectasis and/or infiltrate. 2. Moderate to marked severity posterior right basilar atelectasis and/or infiltrate. 3. Very small right pleural effusion. 4. Mild cardiomegaly. 5. Stable emphysematous lung disease. 6. Chronic lateral eighth left rib fracture. 7. Chronic compression fracture deformity of the T12 vertebral body. 8. Aortic atherosclerosis. Aortic Atherosclerosis (ICD10-I70.0) and Emphysema (ICD10-J43.9). Electronically Signed   By: Virgina Norfolk M.D.   On: 04/06/2020 15:33   CT ANGIO CHEST PE W OR WO CONTRAST  Result Date: 04/07/2020 CLINICAL DATA:  Shortness of breath EXAM: CT ANGIOGRAPHY CHEST WITH CONTRAST TECHNIQUE: Multidetector CT imaging of the chest was performed using the standard protocol during bolus administration of intravenous contrast. Multiplanar CT  image reconstructions and MIPs were obtained to evaluate the vascular anatomy. CONTRAST:  82mL OMNIPAQUE IOHEXOL 350 MG/ML SOLN COMPARISON:  April 06, 2020 FINDINGS: Cardiovascular: There is a optimal opacification of the pulmonary arteries. There is no central,segmental, or subsegmental filling defects within the pulmonary arteries. There is unchanged cardiomegaly. Mitral valve calcifications and coronary artery calcifications are seen. No pericardial effusion or thickening. No evidence right heart strain. There is normal three-vessel brachiocephalic anatomy without proximal stenosis. Scattered aortic atherosclerosis is noted. Mediastinum/Nodes: No hilar, mediastinal, or axillary adenopathy. Thyroid gland, trachea, and esophagus demonstrate no significant findings. Lungs/Pleura: Patchy airspace opacity seen at the left upper lung and posterior right lung base with air bronchograms. There are trace bilateral pleural effusions present. Centrilobular and paraseptal emphysematous changes seen at both lung apices. Upper Abdomen: No acute abnormalities present in the visualized portions of the upper abdomen. Musculoskeletal: No chest wall abnormality. No acute or significant osseous findings. There is a chronic anterior wedge compression deformity of the T12 vertebral body with 50% loss in vertebral body height. Review of the MIP images confirms the above findings. IMPRESSION: No central, segmental, or subsegmental pulmonary embolism. Unchanged multifocal patchy airspace opacities within both lungs which could be due to multifocal pneumonia Trace bilateral pleural effusions, right greater than left. Aortic Atherosclerosis (ICD10-I70.0). Electronically Signed   By: Prudencio Pair M.D.   On: 04/07/2020 19:43   US Venous Img Lower Bilateral (DVT)  Result Date: 04/03/2020 CLINICAL DATA:  Lower leg cellulitis EXAM: BILATERAL LOWER EXTREMITY VENOUS DOPPLER ULTRASOUND TECHNIQUE: Gray-scale sonography with graded  compression, as well as color Doppler and duplex ultrasound were performed to evaluate the lower extremity deep venous systems from the level of the common femoral vein and including the common femoral, femoral, profunda femoral, popliteal and calf veins including the posterior tibial, peroneal and gastrocnemius veins when visible. The superficial great saphenous vein was also interrogated. Spectral Doppler was utilized to evaluate flow at rest and with distal augmentation maneuvers in the common femoral, femoral and popliteal veins. COMPARISON:  None. FINDINGS: RIGHT LOWER EXTREMITY Common Femoral Vein: No evidence of thrombus. Normal compressibility, respiratory phasicity and response to augmentation. Saphenofemoral Junction: No evidence of thrombus. Normal compressibility and flow on color Doppler imaging. Profunda Femoral Vein: No evidence of thrombus. Normal compressibility and flow on color Doppler imaging. Femoral Vein: No evidence of thrombus. Normal compressibility, respiratory phasicity and response to augmentation. Popliteal Vein: Not well visualized due to overlying edema. Calf Veins: Not well visualized due to overlying edema. Superficial Great Saphenous Vein: No evidence of thrombus. Normal compressibility. Venous Reflux:  None. Other Findings:  None. LEFT LOWER EXTREMITY Common Femoral Vein: No evidence of thrombus. Normal compressibility, respiratory phasicity and response to augmentation. Saphenofemoral Junction: No evidence of thrombus. Normal compressibility and flow on color Doppler imaging. Profunda Femoral Vein: No evidence of thrombus. Normal compressibility and flow on color Doppler imaging. Femoral Vein: No evidence of thrombus. Normal compressibility, respiratory phasicity and response to augmentation. Popliteal Vein: Not well visualized due to overlying edema. Calf Veins: Not well visualized due to overlying edema. Superficial Great Saphenous Vein: No evidence of thrombus. Normal  compressibility. Venous Reflux:  None. Other Findings:  None. IMPRESSION: Somewhat limited exam due to peripheral edema although no central deep venous thrombosis is noted. Electronically Signed   By: Inez Catalina M.D.   On: 04/03/2020 15:58   ECHOCARDIOGRAM COMPLETE  Result Date: 04/04/2020    ECHOCARDIOGRAM REPORT   Patient Name:   Martha [name] Date of Exam: 04/04/2020 Medical Rec #:  [id number]      Height:       64.0 in Accession #:  [id number]     Weight:       181.8 lb Date of Birth:  02/21/1946     BSA:          1.879 m Patient Age:    30 years       BP:           171/78 mmHg Patient Gender: F              HR:           98 bpm. Exam Location:  [facility] Procedure: 2D Echo, Color Doppler, Cardiac Doppler and Intracardiac            Opacification Agent Indications:     I50.31 CHF-Acute Diastolic  History:         Patient has no prior history of Echocardiogram examinations.                  COPD, Signs/Symptoms:Shortness of Breath; Risk Factors:Diabetes                  and Current Smoker.  Sonographer:     Charmayne Sheer RDCS (AE) Referring Phys:  Baker Janus Soledad Gerlach NIU Diagnosing Phys: Bartholome Bill MD  Sonographer Comments: Technically difficult study due to poor echo windows. Image acquisition challenging due to patient body habitus and Image acquisition challenging due to COPD. IMPRESSIONS  1. Left ventricular ejection fraction, by estimation, is 55 to 60%. The left ventricle has normal function. The left ventricle has no regional wall motion abnormalities. Left ventricular diastolic parameters are consistent with Grade I diastolic dysfunction (impaired relaxation).  2. Right ventricular systolic function is normal. The right ventricular size is mildly enlarged.  3. Left atrial size was mildly dilated.  4. Right atrial size was mildly dilated.  5. The mitral valve was not well visualized. Trivial mitral valve regurgitation.  6. The aortic valve was not well visualized. Aortic valve regurgitation is trivial. FINDINGS   Left Ventricle: Left ventricular ejection fraction, by estimation, is 55 to 60%. The left ventricle has normal function. The left ventricle has no regional wall motion abnormalities. Definity contrast agent was given IV to delineate the left ventricular  endocardial borders. The left ventricular internal cavity size was normal in size. There is borderline left ventricular hypertrophy. Left ventricular diastolic parameters are consistent with Grade I diastolic dysfunction (impaired relaxation). Right Ventricle: The right ventricular size is mildly enlarged. No increase in right ventricular wall thickness. Right ventricular systolic function is normal. Left Atrium: Left atrial size was mildly dilated. Right Atrium: Right atrial size was mildly dilated. Pericardium: There is no evidence of pericardial effusion. Mitral Valve: The mitral valve was not well visualized. Trivial mitral valve regurgitation. MV peak gradient, 7.4 mmHg. The mean mitral valve gradient is 3.0 mmHg. Tricuspid Valve: The tricuspid valve is not well visualized. Tricuspid valve regurgitation is trivial. Aortic Valve: The aortic valve was not well visualized. Aortic valve regurgitation is trivial. Aortic valve mean gradient measures 3.0 mmHg. Aortic valve peak gradient measures 6.8 mmHg. Aortic valve area, by VTI measures 2.46 cm. Pulmonic Valve: The pulmonic valve was not well visualized. Pulmonic valve regurgitation is not visualized. Aorta: The aortic root was not well visualized. IAS/Shunts: The interatrial septum was not assessed.  LEFT VENTRICLE PLAX 2D LVIDd:         4.15 cm     Diastology LVIDs:         3.64 cm     LV e' medial:    5.98 cm/s  LV PW:         1.43 cm     LV E/e' medial:  18.3 LV IVS:        1.12 cm     LV e' lateral:   6.53 cm/s LVOT diam:     2.00 cm     LV E/e' lateral: 16.8 LV SV:         55 LV SV Index:   29 LVOT Area:     3.14 cm  LV Volumes (MOD) LV vol d, MOD A2C: 55.5 ml LV vol d, MOD A4C: 63.8 ml LV vol s, MOD A2C:  34.3 ml LV vol s, MOD A4C: 23.4 ml LV SV MOD A2C:     21.2 ml LV SV MOD A4C:     63.8 ml LV SV MOD BP:      34.2 ml RIGHT VENTRICLE RV Basal diam:  3.76 cm LEFT ATRIUM             Index       RIGHT ATRIUM           Index LA diam:        4.90 cm 2.61 cm/m  RA Area:     19.80 cm LA Vol (A2C):   59.5 ml 31.67 ml/m RA Volume:   61.10 ml  32.52 ml/m LA Vol (A4C):   57.5 ml 30.61 ml/m LA Biplane Vol: 61.2 ml 32.58 ml/m  AORTIC VALVE                   PULMONIC VALVE AV Area (Vmax):    2.51 cm    PV Vmax:       0.76 m/s AV Area (Vmean):   2.43 cm    PV Vmean:      53.200 cm/s AV Area (VTI):     2.46 cm    PV VTI:        0.126 m AV Vmax:           130.00 cm/s PV Peak grad:  2.3 mmHg AV Vmean:          83.500 cm/s PV Mean grad:  1.0 mmHg AV VTI:            0.225 m AV Peak Grad:      6.8 mmHg AV Mean Grad:      3.0 mmHg LVOT Vmax:         104.00 cm/s LVOT Vmean:        64.700 cm/s LVOT VTI:          0.176 m LVOT/AV VTI ratio: 0.78  AORTA Ao Root diam: 3.10 cm MITRAL VALVE                TRICUSPID VALVE MV Area (PHT): 3.00 cm     TR Peak grad:   28.9 mmHg MV Peak grad:  7.4 mmHg     TR Vmax:        269.00 cm/s MV Mean grad:  3.0 mmHg MV Vmax:       1.36 m/s     SHUNTS MV Vmean:      86.9 cm/s    Systemic VTI:  0.18 m MV Decel Time: 253 msec     Systemic Diam: 2.00 cm MV E velocity: 109.50 cm/s MV A velocity: 137.50 cm/s MV E/A ratio:  0.80 Bartholome Bill MD Electronically signed by Bartholome Bill MD Signature Date/Time: 04/04/2020/4:54:02 PM    Final       ASSESSMENT/PLAN  Acute on chronic hypoxemic respiratory failure -Due to acute exacerbation of CHF as well as right lower lobe pneumonia with pulmonary edema -BNP is severely elevated over 2500  -Patient presented with mild leukocytosis with left shift suggestive of possible infectious etiology -Nephrology on case appreciate input patient is currently on Lasix drip -Agree with vancomycin and Zosyn-we will obtain a procalcitonin trend to guide  de-escalation -Would recommend to DC vancomycin since MRSA PCR is negative -Solu-Medrol 20 mg twice daily due to COPD with exacerbation -ABG -Procalcitonin trend   Altered mental status with confusion -Patient on gabapentin, fentanyl, Ativan, Zanaflex, Zoloft, Requip, oxycodone -Daughter is present during my examination and reports that patient does not take any of these medications on a regular basis at home, specifically she states that Zoloft is taken as needed when patient feels sad, and gabapentin is only taken seldomly.  Additionally she mentioned that when patient does take gabapentin she generally goes into deep sleep from 1 pill. -Recommend decreasing centrally acting medication -will obtain ABG to evaluate hypercanic encephalopathy -continue on BIPAP       Thank you for allowing me to participate in the care of this patient.    Patient/Family are satisfied with care plan and all questions have been answered.   This document was prepared using Dragon voice recognition software and may include unintentional dictation errors.     Ottie Glazier, M.D.  Division of [facility]

## 2020-04-08 NOTE — Progress Notes (Addendum)
PROGRESS NOTE    Martha [name]  URK:[id number] DOB: Mar 15, 1946 DOA: 04/03/2020 PCP: Tracie Harrier, MD   Brief Narrative: Taken from H&P  Martha [name] is a 74 y.o. female with medical history significant of  HTN, HLD, DM, COPD, tobacco abuse, hypothyroidism, gout, depression, tremor, proteinuria, depression, anxiety, GERD, diverticulitis, who presents with bilateral leg edema and pain, and SOB.  Patient states that she has chronic bilateral leg edema which has been progressively worsened recently. Both lower legs are painful and erythematous. She states that she gets her lower extremities wrapped once a week.  She has chronic cough and shortness of breath which has slightly worsened on exertion.  She coughs up some white mucus.  No fever or chills.  No chest pain.  On presentation she was hypoxic requiring BiPAP, later transitioned to 4 L of oxygen. Patient also has an history of protein urea which is being worked up by nephrology as an outpatient and they are planning for biopsy.  Patient had pertinent labs positive for BNP of 1113, troponin and COVID-19 negative.  Chest x-ray with borderline cardiomegaly and mild pulmonary edema.  Significant edema of bilateral lower extremities with signs of venous congestion, skin peeling and few shallow ulcers.  Cardiology was consulted.  Subjective: Overnight complained of chest pain which has resolved. This morning complaining of abdominal pain. Moaning in bed, doesn't respond to most questions.   Assessment & Plan:   Principal Problem:   Acute CHF (congestive heart failure) (HCC) Active Problems:   COPD exacerbation (HCC)   Diabetes mellitus without complication ([place])   Current tobacco use   Hyperlipidemia   Essential hypertension   Hypothyroidism   Gout   Depression   Tremors of nervous system   Acute respiratory failure with hypoxia (HCC)   Cellulitis of lower extremity   Acute pulmonary edema (HCC)   Elevated brain natriuretic  peptide (BNP) level  Acute toxic metabolic encephalopathy This morning remains altered. CT shows possible multifocal pna and pt has borderline fever. Hypercarbia may also contribute. Procalcitonin was low - start vanc/zosyn/azith - vbg, urine culture, blood culture - will attempt to place patient back on bipap. If doesn't tolerate or improve may need transfer to step-down - given worsening presentation will consult pulmonology. Daughter reveals to pulm that many of patient's sedating home meds pt not actually taking regularly at home  Abdominal pain  This morning complaining of diffuse abdominal pain, unable to specify further. No vomiting or diarrhea reported. Borderline fever - will check CT of abdomen  Acute hypoxic respiratory failure due to combination of acute on chronic CHF and COPD exacerbation.  TTE w/ diastolic dysfunction. Here bnp elevated. UOP has been good, neg neg 2 L yesterday.  Normal stress testing done in 2019.  CT obtained 12/8 for hemoptysis w/ atelectasis vs multifocal pna. covid neg on admission. CTA neg for PE. -Continue with IV Lasix infusion w/ albumin -Daily weight and BMP. -Strict intake and output.  Hemoptysis  Small volume, blood tinged sputum. No signs respiratory compromise. Suspect 2/2 coughing. Ct w/ contrast showing atelectasis/infiltrate, neg for PE  Proteinuria/nephrotic syndrome.  Patient is being managed by nephrology as an outpatient.  Can be contributing to her lower extremity edema.  Nephrology is following and they start her on Lasix infusion instead of boluses. - kidney biopsy scheduled for today now on hold - continue lasix/albumin  COPD exacerbation.   Treated with steroids. Adding abx as above -Continue with bronchodilators. -continue prednisone -Continue with incentive spirometry. -  Continue with supportive care. -Continue with supplemental oxygen to keep the saturation above 90%.  Bilateral lower extremity venous stasis ulcerss.    Wound care consulted, compression wrapped. S/p ceftriaxone/keflex 5 days. - continue compression therapy -Continue with diuresis. -Continue with pain management  Bacteriuria Patient was unable to specify UTI symptoms but appears pan positive. UA with pyuria, bacteriuria and positive nitrites. -Urine culture growing E. Coli pan sensitive - s/p 5 day course ceftriaxone>keflex  Hypokalemia Resolved, likely 2/2 lasix - kcl 40 bid  Type 2 diabetes mellitus.  A1c of 6.5.  Patient was on Metformin and glipizide at home. -Continue with SSI.  Tobacco abuse.  Patient is a heavy smoker, smokes all day long. Nicotine allergy was listed in her chart. -Ordered nicotine patch at daughter's request as patient was craving a lot for smoking.  Essential hypertension.   BP mildly elevated -Continue with Cozaar. - added amlodipine - continue diuresis -Continue with as needed hydralazine.  Hypothyroidism. -Continue with home dose of Synthroid  Hyperlipidemia. -Continue with Crestor.  History of gout.  No acute concerns. -Colchicine as needed.  History of depression. - daughter says not taking sertraline regularly at home, will decrease to 25, plan to wean  History of tremors. -stop home primidone given encephalopathy  Chronic pain - hold home oxy, decrease gabapentin w/ plan to wean  Objective: Vitals:   04/08/20 0401 04/08/20 0449 04/08/20 0751 04/08/20 0846  BP:   (!) 169/62 (!) 135/94  Pulse:   (!) 110 (!) 114  Resp:  20 20 20   Temp:   99.2 F (37.3 C) 99.5 F (37.5 C)  TempSrc:   Oral Oral  SpO2: 92%  95% 92%  Weight:      Height:        Intake/Output Summary (Last 24 hours) at 04/08/2020 1006 Last data filed at 04/08/2020 0507 Gross per 24 hour  Intake 991.92 ml  Output 2600 ml  Net -1608.08 ml   Filed Weights   04/06/20 0403 04/07/20 0500 04/08/20 0351  Weight: 76.6 kg 74 kg 72.3 kg    Examination:  General.  Obese elderly lady, moaning Pulmonary.   Scattered wheeze/rhonchi CV.  Tachycardic, rr, soft syscolic murmur Abdomen.  Mild distention, mild diffuse ttp CNS.  Moaning, answers very simple questions, moving all 4 extremities Extremities.  2+ LE edema, bilateral lower extremities with compression bandages Psychiatry.  Unable to assess  DVT prophylaxis: Lovenox Code Status: Full Family Communication: Daughter was updated telephonically 12/8 Disposition Plan:  Status is: Inpatient  Remains inpatient appropriate because:Inpatient level of care appropriate due to severity of illness   Dispo: The patient is from: Home              Anticipated d/c is to: SNF (has bed, will just need covid test day of discharge)              Anticipated d/c date is: tbd              Patient currently is not medically stable to d/c.    Consultants:   Cardiology  Pulmonology  Nephrology  Procedures:  Antimicrobials:  Ceftriaxone>keflex, finished Vanc/zosyn/azithromycin 12/10>  Data Reviewed: I have personally reviewed following labs and imaging studies  CBC: Recent Labs  Lab 04/03/20 1141 04/04/20 0608 04/07/20 0416  WBC 10.7* 13.8* 8.6  NEUTROABS 9.4*  --   --   HGB 17.5* 16.4* 14.1  HCT 53.7* 51.7* 45.5  MCV 89.6 88.8 91.9  PLT 296 236 177  Basic Metabolic Panel: Recent Labs  Lab 04/04/20 0608 04/05/20 0546 04/06/20 0426 04/06/20 1253 04/07/20 0416 04/08/20 0415  NA 134* 135 134*  --  138 139  K 4.4 4.2 3.2*  --  3.6 3.7  CL 94* 92* 86*  --  88* 84*  CO2 29 32 36*  --  36* 37*  GLUCOSE 159* 147* 132*  --  90 158*  BUN 16 17 17   --  25* 25*  CREATININE 0.70 0.71 0.65  --  0.65 0.61  CALCIUM 8.1* 8.1* 8.3*  --  8.7* 9.6  MG 1.7 2.2 1.8 1.7 2.1 1.8   GFR: Estimated Creatinine Clearance: 59.2 mL/min (by C-G formula based on SCr of 0.61 mg/dL). Liver Function Tests: Recent Labs  Lab 04/03/20 1141  AST 19  ALT 11  ALKPHOS 89  BILITOT 0.4  PROT 6.7  ALBUMIN 2.7*   No results for input(s): LIPASE, AMYLASE in  the last 168 hours. No results for input(s): AMMONIA in the last 168 hours. Coagulation Profile: Recent Labs  Lab 04/04/20 0608 04/07/20 0756  INR 1.0 1.1   Cardiac Enzymes: No results for input(s): CKTOTAL, CKMB, CKMBINDEX, TROPONINI in the last 168 hours. BNP (last 3 results) No results for input(s): PROBNP in the last 8760 hours. HbA1C: No results for input(s): HGBA1C in the last 72 hours. CBG: Recent Labs  Lab 04/07/20 0818 04/07/20 1133 04/07/20 1647 04/07/20 2056 04/08/20 0752  GLUCAP 120* 251* 170* 155* 185*   Lipid Profile: No results for input(s): CHOL, HDL, LDLCALC, TRIG, CHOLHDL, LDLDIRECT in the last 72 hours. Thyroid Function Tests: No results for input(s): TSH, T4TOTAL, FREET4, T3FREE, THYROIDAB in the last 72 hours. Anemia Panel: No results for input(s): VITAMINB12, FOLATE, FERRITIN, TIBC, IRON, RETICCTPCT in the last 72 hours. Sepsis Labs: Recent Labs  Lab 04/03/20 1141 04/04/20 0608 04/07/20 0416  PROCALCITON <0.10 <0.10 <0.10    Recent Results (from the past 240 hour(s))  Resp Panel by RT-PCR (Flu A&B, Covid) Nasopharyngeal Swab     Status: None   Collection Time: 04/03/20 11:41 AM   Specimen: Nasopharyngeal Swab; Nasopharyngeal(NP) swabs in vial transport medium  Result Value Ref Range Status   SARS Coronavirus 2 by RT PCR NEGATIVE NEGATIVE Final    Comment: (NOTE) SARS-CoV-2 target nucleic acids are NOT DETECTED.  The SARS-CoV-2 RNA is generally detectable in upper respiratory specimens during the acute phase of infection. The lowest concentration of SARS-CoV-2 viral copies this assay can detect is 138 copies/mL. A negative result does not preclude SARS-Cov-2 infection and should not be used as the sole basis for treatment or other patient management decisions. A negative result may occur with  improper specimen collection/handling, submission of specimen other than nasopharyngeal swab, presence of viral mutation(s) within the areas  targeted by this assay, and inadequate number of viral copies(<138 copies/mL). A negative result must be combined with clinical observations, patient history, and epidemiological information. The expected result is Negative.  Fact Sheet for Patients:  EntrepreneurPulse.com.au  Fact Sheet for Healthcare Providers:  IncredibleEmployment.be  This test is no t yet approved or cleared by the [place] FDA and  has been authorized for detection and/or diagnosis of SARS-CoV-2 by FDA under an Emergency Use Authorization (EUA). This EUA will remain  in effect (meaning this test can be used) for the duration of the COVID-19 declaration under Section 564(b)(1) of the Act, 21 U.S.C.section 360bbb-3(b)(1), unless the authorization is terminated  or revoked sooner.       Influenza  A by PCR NEGATIVE NEGATIVE Final   Influenza B by PCR NEGATIVE NEGATIVE Final    Comment: (NOTE) The Xpert Xpress SARS-CoV-2/FLU/RSV plus assay is intended as an aid in the diagnosis of influenza from Nasopharyngeal swab specimens and should not be used as a sole basis for treatment. Nasal washings and aspirates are unacceptable for Xpert Xpress SARS-CoV-2/FLU/RSV testing.  Fact Sheet for Patients: EntrepreneurPulse.com.au  Fact Sheet for Healthcare Providers: IncredibleEmployment.be  This test is not yet approved or cleared by the [place] FDA and has been authorized for detection and/or diagnosis of SARS-CoV-2 by FDA under an Emergency Use Authorization (EUA). This EUA will remain in effect (meaning this test can be used) for the duration of the COVID-19 declaration under Section 564(b)(1) of the Act, 21 U.S.C. section 360bbb-3(b)(1), unless the authorization is terminated or revoked.  Performed at [facility], [address] [place], [place] [address]   Culture, blood (Routine X 2) w Reflex to ID Panel     Status:  None   Collection Time: 04/03/20  3:27 PM   Specimen: BLOOD  Result Value Ref Range Status   Specimen Description BLOOD BLOOD RIGHT ARM  Final   Special Requests   Final    BOTTLES DRAWN AEROBIC AND ANAEROBIC Blood Culture adequate volume   Culture   Final    NO GROWTH 5 DAYS Performed at [facility], [address], [place], [place] [address]    Report Status 04/08/2020 FINAL  Final  Urine Culture     Status: Abnormal   Collection Time: 04/03/20  4:00 PM   Specimen: Urine, Random  Result Value Ref Range Status   Specimen Description   Final    URINE, RANDOM Performed at [facility], [address], [place], [place] [address]    Special Requests   Final    NONE Performed at [facility], [address], [place], [place] [address]    Culture (A)  Final    >=100,000 COLONIES/mL ESCHERICHIA COLI >=100,000 COLONIES/mL AEROCOCCUS URINAE Standardized susceptibility testing for this organism is not available. Performed at [facility], [place] [address] [place],  [address]    Report Status 04/07/2020 FINAL  Final   Organism ID, Bacteria ESCHERICHIA COLI (A)  Final      Susceptibility   Escherichia coli - MIC*    AMPICILLIN 4 SENSITIVE Sensitive     CEFAZOLIN <=4 SENSITIVE Sensitive     CEFEPIME <=0.12 SENSITIVE Sensitive     CEFTRIAXONE <=0.25 SENSITIVE Sensitive     CIPROFLOXACIN <=0.25 SENSITIVE Sensitive     GENTAMICIN <=1 SENSITIVE Sensitive     IMIPENEM <=0.25 SENSITIVE Sensitive     NITROFURANTOIN 32 SENSITIVE Sensitive     TRIMETH/SULFA <=20 SENSITIVE Sensitive     AMPICILLIN/SULBACTAM <=2 SENSITIVE Sensitive     PIP/TAZO <=4 SENSITIVE Sensitive     * >=100,000 COLONIES/mL ESCHERICHIA COLI  Culture, blood (Routine X 2) w Reflex to ID Panel     Status: None   Collection Time: 04/03/20 10:36 PM   Specimen: BLOOD  Result Value Ref Range Status   Specimen Description BLOOD BLOOD LEFT HAND  Final   Special Requests   Final     BOTTLES DRAWN AEROBIC AND ANAEROBIC Blood Culture adequate volume   Culture   Final    NO GROWTH 5 DAYS Performed at [facility], [address], [place],  [address]    Report Status 04/08/2020 FINAL  Final  Radiology Studies: CT CHEST W CONTRAST  Result Date: 04/06/2020 CLINICAL DATA:  Hemoptysis. EXAM: CT CHEST WITH CONTRAST TECHNIQUE: Multidetector CT imaging of the chest was performed during intravenous contrast administration. CONTRAST:  57mL OMNIPAQUE IOHEXOL 300 MG/ML  SOLN COMPARISON:  January 06, 2019 FINDINGS: Cardiovascular: There is marked severity calcification of the aortic arch. There is mild cardiomegaly. No pericardial effusion. Mediastinum/Nodes: There is mild pretracheal and right hilar lymphadenopathy. Thyroid gland, trachea, and esophagus demonstrate no significant findings. Lungs/Pleura: Mild-to-moderate severity emphysematous lung disease is seen involving the bilateral upper lobes with stable bullous disease is seen along the medial aspect of the left apex. Mild to moderate severity areas of atelectasis and/or infiltrate are seen within the posterior aspects of the bilateral upper lobes and left lung base. Moderate to marked severity posterior right basilar atelectasis and/or infiltrate is noted. A very small right pleural effusion is seen. No pneumothorax is identified. Upper Abdomen: There is stable hepatosplenomegaly with stable, moderate severity diffuse bilateral adrenal gland enlargement. Musculoskeletal: A chronic lateral eighth left rib fracture is seen. A chronic compression fracture deformity of the T12 vertebral body is noted. IMPRESSION: 1. Mild-to-moderate severity posterior bilateral upper lobe and left basilar atelectasis and/or infiltrate. 2. Moderate to marked severity posterior right basilar atelectasis and/or infiltrate. 3. Very small right pleural effusion. 4. Mild cardiomegaly. 5. Stable emphysematous lung disease. 6. Chronic lateral  eighth left rib fracture. 7. Chronic compression fracture deformity of the T12 vertebral body. 8. Aortic atherosclerosis. Aortic Atherosclerosis (ICD10-I70.0) and Emphysema (ICD10-J43.9). Electronically Signed   By: Virgina Norfolk M.D.   On: 04/06/2020 15:33   CT ANGIO CHEST PE W OR WO CONTRAST  Result Date: 04/07/2020 CLINICAL DATA:  Shortness of breath EXAM: CT ANGIOGRAPHY CHEST WITH CONTRAST TECHNIQUE: Multidetector CT imaging of the chest was performed using the standard protocol during bolus administration of intravenous contrast. Multiplanar CT image reconstructions and MIPs were obtained to evaluate the vascular anatomy. CONTRAST:  54mL OMNIPAQUE IOHEXOL 350 MG/ML SOLN COMPARISON:  April 06, 2020 FINDINGS: Cardiovascular: There is a optimal opacification of the pulmonary arteries. There is no central,segmental, or subsegmental filling defects within the pulmonary arteries. There is unchanged cardiomegaly. Mitral valve calcifications and coronary artery calcifications are seen. No pericardial effusion or thickening. No evidence right heart strain. There is normal three-vessel brachiocephalic anatomy without proximal stenosis. Scattered aortic atherosclerosis is noted. Mediastinum/Nodes: No hilar, mediastinal, or axillary adenopathy. Thyroid gland, trachea, and esophagus demonstrate no significant findings. Lungs/Pleura: Patchy airspace opacity seen at the left upper lung and posterior right lung base with air bronchograms. There are trace bilateral pleural effusions present. Centrilobular and paraseptal emphysematous changes seen at both lung apices. Upper Abdomen: No acute abnormalities present in the visualized portions of the upper abdomen. Musculoskeletal: No chest wall abnormality. No acute or significant osseous findings. There is a chronic anterior wedge compression deformity of the T12 vertebral body with 50% loss in vertebral body height. Review of the MIP images confirms the above findings.  IMPRESSION: No central, segmental, or subsegmental pulmonary embolism. Unchanged multifocal patchy airspace opacities within both lungs which could be due to multifocal pneumonia Trace bilateral pleural effusions, right greater than left. Aortic Atherosclerosis (ICD10-I70.0). Electronically Signed   By: Prudencio Pair M.D.   On: 04/07/2020 19:43    Scheduled Meds: . amLODipine  10 mg Oral Daily  . vitamin C  1,000 mg Oral Daily  . azithromycin  500 mg Oral Daily  . fluticasone  1 spray Each Nare BID  . gabapentin  600 mg Oral TID  . insulin aspart  0-5 Units Subcutaneous QHS  . insulin aspart  0-9 Units Subcutaneous TID WC  . ipratropium-albuterol  3 mL Nebulization Q4H  . levothyroxine  150 mcg Oral Q0600  . loratadine  10 mg Oral Daily  . losartan  50 mg Oral Daily  . nicotine  21 mg Transdermal Daily  . potassium chloride  40 mEq Oral BID  . predniSONE  50 mg Oral Q breakfast  . primidone  50 mg Oral BID  . rOPINIRole  0.5 mg Oral QHS  . rosuvastatin  5 mg Oral Daily  . sertraline  50 mg Oral Daily  . sodium chloride flush  3 mL Intravenous Q12H  . vitamin B-12  1,000 mcg Oral Daily   Continuous Infusions: . sodium chloride    . albumin human 25 g (04/08/20 0839)  . furosemide (LASIX) 200 mg in dextrose 5% 100 mL (2mg /mL) infusion 6 mg/hr (04/05/20 1635)  . piperacillin-tazobactam (ZOSYN)  IV    . vancomycin    . vancomycin       LOS: 5 days   Time spent: 35 minutes  Desma Maxim, MD [facility]  If 7PM-7AM, please contact night-coverage [email or web address]  04/08/2020, 10:06 AM

## 2020-04-08 NOTE — Progress Notes (Signed)
Mobility Specialist - Progress Note   04/08/20 1500  Mobility  Activity Contraindicated/medical hold  Mobility performed by Mobility specialist     Per chart review and discussion with nurse, pt inappropriate for activity exertion this date. Still currently on bipap. Will attempt session at another time.    Kathee Delton Mobility Specialist 04/08/20, 3:28 PM

## 2020-04-08 NOTE — Progress Notes (Signed)
PT Cancellation Note  Patient Details Name: Martha [name] MRN: [id number] DOB: 12-11-1945   Cancelled Treatment:     Per chart review, PT hold. Pt currently not appropriate for PT session. Will hold PT this date and return when pt is more appropriate to participate.    Willette Pa 04/08/2020, 9:34 AM

## 2020-04-09 ENCOUNTER — Inpatient Hospital Stay: Payer: Medicare Other

## 2020-04-09 DIAGNOSIS — N049 Nephrotic syndrome with unspecified morphologic changes: Secondary | ICD-10-CM

## 2020-04-09 LAB — BASIC METABOLIC PANEL
Anion gap: 17 — ABNORMAL HIGH (ref 5–15)
BUN: 26 mg/dL — ABNORMAL HIGH (ref 8–23)
CO2: 39 mmol/L — ABNORMAL HIGH (ref 22–32)
Calcium: 9.7 mg/dL (ref 8.9–10.3)
Chloride: 87 mmol/L — ABNORMAL LOW (ref 98–111)
Creatinine, Ser: 0.58 mg/dL (ref 0.44–1.00)
GFR, Estimated: >60 mL/min (ref >60)
Glucose, Bld: 195 mg/dL — ABNORMAL HIGH (ref 70–99)
Potassium: 2.9 mmol/L — ABNORMAL LOW (ref 3.5–5.1)
Sodium: 143 mmol/L (ref 135–145)

## 2020-04-09 LAB — BLOOD GAS, ARTERIAL
Acid-Base Excess: 21.1 mmol/L — ABNORMAL HIGH (ref 0.0–2.0)
Bicarbonate: 48.8 mmol/L — ABNORMAL HIGH (ref 20.0–28.0)
O2 Saturation: 94.2 %
Patient temperature: 37
pCO2 arterial: 64 mmHg — ABNORMAL HIGH (ref 32.0–48.0)
pH, Arterial: 7.49 — ABNORMAL HIGH (ref 7.350–7.450)
pO2, Arterial: 66 mmHg — ABNORMAL LOW (ref 83.0–108.0)

## 2020-04-09 LAB — BRAIN NATRIURETIC PEPTIDE: B Natriuretic Peptide: 3607.4 pg/mL — ABNORMAL HIGH (ref 0.0–100.0)

## 2020-04-09 LAB — HEPATIC FUNCTION PANEL
ALT: 18 U/L (ref 0–44)
AST: 16 U/L (ref 15–41)
Albumin: 5.3 g/dL — ABNORMAL HIGH (ref 3.5–5.0)
Alkaline Phosphatase: 81 U/L (ref 38–126)
Bilirubin, Direct: 0.4 mg/dL — ABNORMAL HIGH (ref 0.0–0.2)
Indirect Bilirubin: 1.5 mg/dL — ABNORMAL HIGH (ref 0.3–0.9)
Total Bilirubin: 1.9 mg/dL — ABNORMAL HIGH (ref 0.3–1.2)
Total Protein: 7.8 g/dL (ref 6.5–8.1)

## 2020-04-09 LAB — URINE CULTURE: Culture: NO GROWTH

## 2020-04-09 LAB — GLUCOSE, CAPILLARY
Glucose-Capillary: 205 mg/dL — ABNORMAL HIGH (ref 70–99)
Glucose-Capillary: 220 mg/dL — ABNORMAL HIGH (ref 70–99)
Glucose-Capillary: 207 mg/dL — ABNORMAL HIGH (ref 70–99)
Glucose-Capillary: 232 mg/dL — ABNORMAL HIGH (ref 70–99)
Glucose-Capillary: 224 mg/dL — ABNORMAL HIGH (ref 70–99)

## 2020-04-09 LAB — LIPASE, BLOOD: Lipase: 27 U/L (ref 11–51)

## 2020-04-09 LAB — MAGNESIUM: Magnesium: 2.3 mg/dL (ref 1.7–2.4)

## 2020-04-09 MED ORDER — METOPROLOL TARTRATE 5 MG/5ML IV SOLN
5.0000 mg | Freq: Once | INTRAVENOUS | Status: AC
  Administered 2020-04-09: 03:00:00 5 mg via INTRAVENOUS
  Filled 2020-04-09: qty 5

## 2020-04-09 MED ORDER — FUROSEMIDE 10 MG/ML IJ SOLN
40.0000 mg | Freq: Once | INTRAMUSCULAR | Status: AC
  Administered 2020-04-09: 09:00:00 40 mg via INTRAVENOUS
  Filled 2020-04-09: qty 4

## 2020-04-09 MED ORDER — FUROSEMIDE 10 MG/ML IJ SOLN
6.0000 mg/h | INTRAVENOUS | Status: DC
  Filled 2020-04-09: qty 20

## 2020-04-09 MED ORDER — MORPHINE SULFATE (PF) 2 MG/ML IV SOLN
2.0000 mg | Freq: Once | INTRAVENOUS | Status: AC
  Administered 2020-04-09: 06:00:00 2 mg via INTRAVENOUS
  Filled 2020-04-09: qty 1

## 2020-04-09 MED ORDER — LORAZEPAM 2 MG/ML IJ SOLN
1.0000 mg | Freq: Once | INTRAMUSCULAR | Status: AC
  Administered 2020-04-10: 1 mg via INTRAVENOUS
  Filled 2020-04-09: qty 1

## 2020-04-09 MED ORDER — LEVOTHYROXINE SODIUM 100 MCG/5ML IV SOLN
75.0000 ug | Freq: Once | INTRAVENOUS | Status: AC
  Administered 2020-04-09: 06:00:00 75 ug via INTRAVENOUS
  Filled 2020-04-09: qty 5

## 2020-04-09 MED ORDER — POTASSIUM CHLORIDE 10 MEQ/100ML IV SOLN
10.0000 meq | INTRAVENOUS | Status: AC
  Administered 2020-04-09 (×4): 10 meq via INTRAVENOUS
  Filled 2020-04-09 (×4): qty 100

## 2020-04-09 MED ORDER — CHLORHEXIDINE GLUCONATE CLOTH 2 % EX PADS
6.0000 | MEDICATED_PAD | Freq: Every day | CUTANEOUS | Status: DC
  Administered 2020-04-09 – 2020-04-19 (×11): 6 via TOPICAL

## 2020-04-09 NOTE — Progress Notes (Addendum)
OT Cancellation Note  Patient Details Name: Martha [name] MRN: [id number] DOB: Mar 21, 1946   Cancelled Treatment:    Reason Eval/Treat Not Completed: Medical issues which prohibited therapy. Chart reviewed - pt noted to have K+ critically low at 2.9, BP critically high 200/66; contraindicated for exertional activity at this time. Per notes pt on bipap and at high risk for intubation. Will continue to follow POC as pt medically appropriate to participate in therapy.   Dessie Coma, M.S. OTR/L  04/09/20, 8:13 AM  ascom [phone]

## 2020-04-09 NOTE — Progress Notes (Signed)
PROGRESS NOTE    Martha [name]  VOZ:[id number] DOB: Aug 12, 1945 DOA: 04/03/2020 PCP: Tracie Harrier, MD   Brief Narrative: Taken from H&P  Martha [name] is a 74 y.o. female with medical history significant of  HTN, HLD, DM, COPD, tobacco abuse, hypothyroidism, gout, depression, tremor, proteinuria, depression, anxiety, GERD, diverticulitis, who presents with bilateral leg edema and pain, and SOB.  Patient states that she has chronic bilateral leg edema which has been progressively worsened recently. Both lower legs are painful and erythematous. She states that she gets her lower extremities wrapped once a week.  She has chronic cough and shortness of breath which has slightly worsened on exertion.  She coughs up some white mucus.  No fever or chills.  No chest pain.  On presentation she was hypoxic requiring BiPAP, later transitioned to 4 L of oxygen. Patient also has an history of protein urea which is being worked up by nephrology as an outpatient and they are planning for biopsy.  Patient had pertinent labs positive for BNP of 1113, troponin and COVID-19 negative.  Chest x-ray with borderline cardiomegaly and mild pulmonary edema.  Significant edema of bilateral lower extremities with signs of venous congestion, skin peeling and few shallow ulcers.  Cardiology was consulted.  Subjective: Overnight remained altered, didn't tolerate bipap for extended periods, currently on 12 HF [place]. No longer complaining of abdominal pain, no n/v.   Assessment & Plan:   Principal Problem:   Acute CHF (congestive heart failure) (HCC) Active Problems:   COPD exacerbation (HCC)   Diabetes mellitus without complication ([place])   Current tobacco use   Hyperlipidemia   Essential hypertension   Hypothyroidism   Gout   Depression   Tremors of nervous system   Acute respiratory failure with hypoxia (HCC)   Cellulitis of lower extremity   Acute pulmonary edema (HCC)   Elevated brain natriuretic peptide  (BNP) level  Acute toxic metabolic encephalopathy Remains altered, suspect hypoxemia/hypercarbia playing a role. mrsa pcr neg. cxr this morning shows worsened pulmonary edema. abg shows alkalosis, hypercarbia, hypoxia.  - cont zosyn/azith - cont solu-medrol - re-start lasix gtt with lasix 40 iv bolus - step-down transfer on hold, no beds available - critical care to see, may be headed toward intubation  Abdominal pain  Yesterday morning complaining of diffuse abdominal pain, unable to specify further. No vomiting or diarrhea reported. Would not sit still for CT. Today doesn't appear to have abd pain, no vomiting or diarrhea - holding on ct, will add on lfts and lipase  Acute hypoxic respiratory failure due to combination of acute on chronic CHF and COPD exacerbation.  TTE w/ diastolic dysfunction. Here bnp elevated and uptrending. UOP has been good, neg neg 2 L yesterday.  Normal stress testing done in 2019.  CT obtained 12/8 for hemoptysis w/ atelectasis vs multifocal pna. covid neg on admission. CTA neg for PE. Troponins mildly elevated and flat, do not think acs -Continue with IV Lasix infusion w/ albumin - continue HF [place], bipap if able to tolerate, critical care eval as above - cont abx and steroids as above -Daily weight and BMP. -Strict intake and output.  Hemoptysis  Small volume, blood tinged sputum. No signs respiratory compromise. Suspect 2/2 coughing. Ct w/ contrast showing atelectasis/infiltrate, neg for PE  Proteinuria/nephrotic syndrome.  Patient is being managed by nephrology as an outpatient.  Can be contributing to her lower extremity edema.  Nephrology is following and they start her on Lasix infusion instead of boluses. -  kidney biopsy now on hold - continue lasix/albumin  Bilateral lower extremity venous stasis ulcerss.   Wound care consulted, compression wrapped. S/p ceftriaxone/keflex 5 days. Edema much improved - continue compression therapy -Continue with  diuresis. -Continue with pain management  Bacteriuria Patient was unable to specify UTI symptoms but appears pan positive. UA with pyuria, bacteriuria and positive nitrites. -Urine culture growing E. Coli pan sensitive - s/p 5 day course ceftriaxone>keflex  Hypokalemia 2.9 today - kcl 10 IV x4 - repeat bmp PM  Type 2 diabetes mellitus.  A1c of 6.5.  Patient was on Metformin and glipizide at home. -Continue with SSI.  Tobacco abuse.  Patient is a heavy smoker, smokes all day long. Nicotine allergy was listed in her chart. -Ordered nicotine patch at daughter's request as patient was craving a lot for smoking.  Essential hypertension.   BP mildly elevated -Continue with Cozaar. - added amlodipine - continue diuresis -Continue with as needed hydralazine.  Hypothyroidism. -Continue with home dose of Synthroid  Hyperlipidemia. -Continue with Crestor.  History of gout.  No acute concerns. -Colchicine as needed.  History of depression. - daughter says not taking sertraline regularly at home, will decrease to 25, plan to wean  History of tremors. -stop home primidone given encephalopathy  Chronic pain - hold home oxy, decrease gabapentin w/ plan to wean  Objective: Vitals:   04/09/20 0406 04/09/20 0528 04/09/20 0617 04/09/20 0700  BP: (!) 188/96 (!) 166/99 (!) 171/83 (!) 173/95  Pulse:  (!) 129 (!) 129   Resp:  (!) 21 (!) 23   Temp: 98 F (36.7 C) 98.5 F (36.9 C) 99.5 F (37.5 C)   TempSrc: Axillary Axillary Oral   SpO2: 92% 90% 99%   Weight: 69.2 kg     Height:        Intake/Output Summary (Last 24 hours) at 04/09/2020 0727 Last data filed at 04/09/2020 3557 Gross per 24 hour  Intake 259 ml  Output 2400 ml  Net -2141 ml   Filed Weights   04/07/20 0500 04/08/20 0351 04/09/20 0406  Weight: 74 kg 72.3 kg 69.2 kg    Examination:  General.  Obese elderly lady, in bed sleeping, rouses some when examined Pulmonary.  Scattered wheeze/rales CV.   Tachycardic, rr, soft syscolic murmur Abdomen.  Mild distention, mild diffuse ttp CNS.  Moving all 4 extremities Extremities.  1+ LE edema, bilateral lower extremities with compression bandages Psychiatry.  Unable to assess  DVT prophylaxis: Lovenox Code Status: Full Family Communication: Daughter was updated @ bedside 12/10 Disposition Plan:  Status is: Inpatient  Remains inpatient appropriate because:Inpatient level of care appropriate due to severity of illness   Dispo: The patient is from: Home              Anticipated d/c is to: SNF (has bed, will just need covid test day of discharge)              Anticipated d/c date is: tbd              Patient currently is not medically stable to d/c.    Consultants:   Cardiology  Pulmonology  Nephrology  Procedures:  Antimicrobials:  Ceftriaxone>keflex, finished Vanc/zosyn/azithromycin 12/10>  Data Reviewed: I have personally reviewed following labs and imaging studies  CBC: Recent Labs  Lab 04/03/20 1141 04/04/20 0608 04/07/20 0416  WBC 10.7* 13.8* 8.6  NEUTROABS 9.4*  --   --   HGB 17.5* 16.4* 14.1  HCT 53.7* 51.7* 45.5  MCV  89.6 88.8 91.9  PLT 296 236 009   Basic Metabolic Panel: Recent Labs  Lab 04/05/20 0546 04/06/20 0426 04/06/20 1253 04/07/20 0416 04/08/20 0415 04/09/20 0438  NA 135 134*  --  138 139 143  K 4.2 3.2*  --  3.6 3.7 2.9*  CL 92* 86*  --  88* 84* 87*  CO2 32 36*  --  36* 37* 39*  GLUCOSE 147* 132*  --  90 158* 195*  BUN 17 17  --  25* 25* 26*  CREATININE 0.71 0.65  --  0.65 0.61 0.58  CALCIUM 8.1* 8.3*  --  8.7* 9.6 9.7  MG 2.2 1.8 1.7 2.1 1.8 2.3   GFR: Estimated Creatinine Clearance: 58 mL/min (by C-G formula based on SCr of 0.58 mg/dL). Liver Function Tests: Recent Labs  Lab 04/03/20 1141  AST 19  ALT 11  ALKPHOS 89  BILITOT 0.4  PROT 6.7  ALBUMIN 2.7*   No results for input(s): LIPASE, AMYLASE in the last 168 hours. Recent Labs  Lab 04/08/20 1002  AMMONIA 34    Coagulation Profile: Recent Labs  Lab 04/04/20 0608 04/07/20 0756  INR 1.0 1.1   Cardiac Enzymes: No results for input(s): CKTOTAL, CKMB, CKMBINDEX, TROPONINI in the last 168 hours. BNP (last 3 results) No results for input(s): PROBNP in the last 8760 hours. HbA1C: No results for input(s): HGBA1C in the last 72 hours. CBG: Recent Labs  Lab 04/07/20 2056 04/08/20 0752 04/08/20 1230 04/08/20 1751 04/08/20 2119  GLUCAP 155* 185* 201* 214* 176*   Lipid Profile: No results for input(s): CHOL, HDL, LDLCALC, TRIG, CHOLHDL, LDLDIRECT in the last 72 hours. Thyroid Function Tests: No results for input(s): TSH, T4TOTAL, FREET4, T3FREE, THYROIDAB in the last 72 hours. Anemia Panel: No results for input(s): VITAMINB12, FOLATE, FERRITIN, TIBC, IRON, RETICCTPCT in the last 72 hours. Sepsis Labs: Recent Labs  Lab 04/03/20 1141 04/04/20 0608 04/07/20 0416 04/08/20 1222  PROCALCITON <0.10 <0.10 <0.10 <0.10    Recent Results (from the past 240 hour(s))  Resp Panel by RT-PCR (Flu A&B, Covid) Nasopharyngeal Swab     Status: None   Collection Time: 04/03/20 11:41 AM   Specimen: Nasopharyngeal Swab; Nasopharyngeal(NP) swabs in vial transport medium  Result Value Ref Range Status   SARS Coronavirus 2 by RT PCR NEGATIVE NEGATIVE Final    Comment: (NOTE) SARS-CoV-2 target nucleic acids are NOT DETECTED.  The SARS-CoV-2 RNA is generally detectable in upper respiratory specimens during the acute phase of infection. The lowest concentration of SARS-CoV-2 viral copies this assay can detect is 138 copies/mL. A negative result does not preclude SARS-Cov-2 infection and should not be used as the sole basis for treatment or other patient management decisions. A negative result may occur with  improper specimen collection/handling, submission of specimen other than nasopharyngeal swab, presence of viral mutation(s) within the areas targeted by this assay, and inadequate number of  viral copies(<138 copies/mL). A negative result must be combined with clinical observations, patient history, and epidemiological information. The expected result is Negative.  Fact Sheet for Patients:  EntrepreneurPulse.com.au  Fact Sheet for Healthcare Providers:  IncredibleEmployment.be  This test is no t yet approved or cleared by the [place] FDA and  has been authorized for detection and/or diagnosis of SARS-CoV-2 by FDA under an Emergency Use Authorization (EUA). This EUA will remain  in effect (meaning this test can be used) for the duration of the COVID-19 declaration under Section 564(b)(1) of the Act, 21 U.S.C.section 360bbb-3(b)(1), unless the  authorization is terminated  or revoked sooner.       Influenza A by PCR NEGATIVE NEGATIVE Final   Influenza B by PCR NEGATIVE NEGATIVE Final    Comment: (NOTE) The Xpert Xpress SARS-CoV-2/FLU/RSV plus assay is intended as an aid in the diagnosis of influenza from Nasopharyngeal swab specimens and should not be used as a sole basis for treatment. Nasal washings and aspirates are unacceptable for Xpert Xpress SARS-CoV-2/FLU/RSV testing.  Fact Sheet for Patients: EntrepreneurPulse.com.au  Fact Sheet for Healthcare Providers: IncredibleEmployment.be  This test is not yet approved or cleared by the [place] FDA and has been authorized for detection and/or diagnosis of SARS-CoV-2 by FDA under an Emergency Use Authorization (EUA). This EUA will remain in effect (meaning this test can be used) for the duration of the COVID-19 declaration under Section 564(b)(1) of the Act, 21 U.S.C. section 360bbb-3(b)(1), unless the authorization is terminated or revoked.  Performed at [facility], [address] [place], [place] [address]   Culture, blood (Routine X 2) w Reflex to ID Panel     Status: None   Collection Time: 04/03/20  3:27 PM    Specimen: BLOOD  Result Value Ref Range Status   Specimen Description BLOOD BLOOD RIGHT ARM  Final   Special Requests   Final    BOTTLES DRAWN AEROBIC AND ANAEROBIC Blood Culture adequate volume   Culture   Final    NO GROWTH 5 DAYS Performed at [facility], [address] [place], [place] [address]    Report Status 04/08/2020 FINAL  Final  Urine Culture     Status: Abnormal   Collection Time: 04/03/20  4:00 PM   Specimen: Urine, Random  Result Value Ref Range Status   Specimen Description   Final    URINE, RANDOM Performed at [facility], [address], [place], [place] [address]    Special Requests   Final    NONE Performed at [facility], [address], [place], [place] [address]    Culture (A)  Final    >=100,000 COLONIES/mL ESCHERICHIA COLI >=100,000 COLONIES/mL AEROCOCCUS URINAE Standardized susceptibility testing for this organism is not available. Performed at [facility], [place] [address] [place], [place] [address]    Report Status 04/07/2020 FINAL  Final   Organism ID, Bacteria ESCHERICHIA COLI (A)  Final      Susceptibility   Escherichia coli - MIC*    AMPICILLIN 4 SENSITIVE Sensitive     CEFAZOLIN <=4 SENSITIVE Sensitive     CEFEPIME <=0.12 SENSITIVE Sensitive     CEFTRIAXONE <=0.25 SENSITIVE Sensitive     CIPROFLOXACIN <=0.25 SENSITIVE Sensitive     GENTAMICIN <=1 SENSITIVE Sensitive     IMIPENEM <=0.25 SENSITIVE Sensitive     NITROFURANTOIN 32 SENSITIVE Sensitive     TRIMETH/SULFA <=20 SENSITIVE Sensitive     AMPICILLIN/SULBACTAM <=2 SENSITIVE Sensitive     PIP/TAZO <=4 SENSITIVE Sensitive     * >=100,000 COLONIES/mL ESCHERICHIA COLI  Culture, blood (Routine X 2) w Reflex to ID Panel     Status: None   Collection Time: 04/03/20 10:36 PM   Specimen: BLOOD  Result Value Ref Range Status   Specimen Description BLOOD BLOOD LEFT HAND  Final   Special Requests   Final    BOTTLES DRAWN AEROBIC AND ANAEROBIC Blood  Culture adequate volume   Culture   Final    NO GROWTH 5 DAYS Performed at [facility], [place],  [place] [address]    Report Status 04/08/2020 FINAL  Final  CULTURE, BLOOD (ROUTINE X 2) w Reflex to ID Panel     Status: None (Preliminary result)   Collection Time: 04/08/20 10:02 AM   Specimen: BLOOD  Result Value Ref Range Status   Specimen Description BLOOD BLOOD RIGHT HAND  Final   Special Requests   Final    BOTTLES DRAWN AEROBIC AND ANAEROBIC Blood Culture results may not be optimal due to an inadequate volume of blood received in culture bottles   Culture   Final    NO GROWTH < 24 HOURS Performed at [facility], [address], [place], [place] [address]    Report Status PENDING  Incomplete  MRSA PCR Screening     Status: None   Collection Time: 04/08/20 10:04 AM   Specimen: Nasal Mucosa; Nasopharyngeal  Result Value Ref Range Status   MRSA by PCR NEGATIVE NEGATIVE Final    Comment:        The GeneXpert MRSA Assay (FDA approved for NASAL specimens only), is one component of a comprehensive MRSA colonization surveillance program. It is not intended to diagnose MRSA infection nor to guide or monitor treatment for MRSA infections. Performed at [facility], [address] [place], [place] [address]   CULTURE, BLOOD (ROUTINE X 2) w Reflex to ID Panel     Status: None (Preliminary result)   Collection Time: 04/08/20 10:26 AM   Specimen: BLOOD  Result Value Ref Range Status   Specimen Description BLOOD BLOOD RIGHT HAND  Final   Special Requests   Final    BOTTLES DRAWN AEROBIC AND ANAEROBIC Blood Culture results may not be optimal due to an inadequate volume of blood received in culture bottles   Culture   Final    NO GROWTH < 24 HOURS Performed at [facility], [address] [place], [place] [address]    Report Status PENDING  Incomplete     Radiology Studies: DG Chest 1 View  Result Date:  04/09/2020 CLINICAL DATA:  Acute respiratory failure EXAM: CHEST  1 VIEW COMPARISON:  04/03/2016 FINDINGS: Normal cardiac silhouette. Interval increase in interstitial edema pattern. No focal consolidation. No pneumothorax. IMPRESSION: Worsening interstitial edema. Electronically Signed   By: Suzy Bouchard M.D.   On: 04/09/2020 06:31   CT ANGIO CHEST PE W OR WO CONTRAST  Result Date: 04/07/2020 CLINICAL DATA:  Shortness of breath EXAM: CT ANGIOGRAPHY CHEST WITH CONTRAST TECHNIQUE: Multidetector CT imaging of the chest was performed using the standard protocol during bolus administration of intravenous contrast. Multiplanar CT image reconstructions and MIPs were obtained to evaluate the vascular anatomy. CONTRAST:  55mL OMNIPAQUE IOHEXOL 350 MG/ML SOLN COMPARISON:  April 06, 2020 FINDINGS: Cardiovascular: There is a optimal opacification of the pulmonary arteries. There is no central,segmental, or subsegmental filling defects within the pulmonary arteries. There is unchanged cardiomegaly. Mitral valve calcifications and coronary artery calcifications are seen. No pericardial effusion or thickening. No evidence right heart strain. There is normal three-vessel brachiocephalic anatomy without proximal stenosis. Scattered aortic atherosclerosis is noted. Mediastinum/Nodes: No hilar, mediastinal, or axillary adenopathy. Thyroid gland, trachea, and esophagus demonstrate no significant findings. Lungs/Pleura: Patchy airspace opacity seen at the left upper lung and posterior right lung base with air bronchograms. There are trace bilateral pleural effusions present. Centrilobular and paraseptal emphysematous changes seen at both lung apices. Upper Abdomen: No acute abnormalities present in the visualized portions of the upper abdomen. Musculoskeletal: No chest wall abnormality. No acute  or significant osseous findings. There is a chronic anterior wedge compression deformity of the T12 vertebral body with 50% loss in  vertebral body height. Review of the MIP images confirms the above findings. IMPRESSION: No central, segmental, or subsegmental pulmonary embolism. Unchanged multifocal patchy airspace opacities within both lungs which could be due to multifocal pneumonia Trace bilateral pleural effusions, right greater than left. Aortic Atherosclerosis (ICD10-I70.0). Electronically Signed   By: Prudencio Pair M.D.   On: 04/07/2020 19:43    Scheduled Meds: . amLODipine  10 mg Oral Daily  . vitamin C  1,000 mg Oral Daily  . azithromycin  500 mg Oral Daily  . fluticasone  1 spray Each Nare BID  . gabapentin  300 mg Oral TID  . insulin aspart  0-5 Units Subcutaneous QHS  . insulin aspart  0-9 Units Subcutaneous TID WC  . ipratropium-albuterol  3 mL Nebulization TID  . levothyroxine  150 mcg Oral Q0600  . loratadine  10 mg Oral Daily  . losartan  50 mg Oral Daily  . methylPREDNISolone (SOLU-MEDROL) injection  20 mg Intravenous Q12H  . nicotine  21 mg Transdermal Daily  . potassium chloride  40 mEq Oral BID  . primidone  50 mg Oral BID  . rosuvastatin  5 mg Oral Daily  . sertraline  25 mg Oral Daily  . sodium chloride flush  3 mL Intravenous Q12H  . vitamin B-12  1,000 mcg Oral Daily   Continuous Infusions: . sodium chloride 250 mL (04/09/20 0521)  . albumin human 25 g (04/09/20 0044)  . furosemide (LASIX) 200 mg in dextrose 5% 100 mL (2mg /mL) infusion    . piperacillin-tazobactam (ZOSYN)  IV 3.375 g (04/09/20 0522)  . potassium chloride       LOS: 6 days   Time spent: 35 minutes  Desma Maxim, MD [facility]  If 7PM-7AM, please contact night-coverage [email or web address]  04/09/2020, 7:27 AM

## 2020-04-09 NOTE — Progress Notes (Signed)
   04/09/20 0005  Assess: MEWS Score  Temp 98.4 F (36.9 C)  BP (!) 202/74  Pulse Rate 93  ECG Heart Rate (!) 108  SpO2 99 %  Assess: MEWS Score  MEWS Temp 0  MEWS Systolic 2  MEWS Pulse 1  MEWS RR 0  MEWS LOC 1  MEWS Score 4  MEWS Score Color Red  Assess: if the MEWS score is Yellow or Red  Were vital signs taken at a resting state? Yes  Focused Assessment Change from prior assessment (see assessment flowsheet)  Early Detection of Sepsis Score *See Row Information* Low  MEWS guidelines implemented *See Row Information* Yes  Treat  MEWS Interventions Administered prn meds/treatments  Pain Scale 0-10  2nd Pain Site  Pain Score 0  Wong-Baker Pain Rating 0  Take Vital Signs  Increase Vital Sign Frequency  Red: Q 1hr X 4 then Q 4hr X 4, if remains red, continue Q 4hrs  Notify: Charge Nurse/RN  Name of Charge Nurse/RN Notified Alcide Evener  Date Charge Nurse/RN Notified 04/09/20  Time Charge Nurse/RN Notified 0018  Notify: Provider  Provider Name/Title Rufina Falco, NP  Date Provider Notified 04/09/20  Time Provider Notified [phone]

## 2020-04-09 NOTE — Progress Notes (Signed)
[facility] Cardiology  SUBJECTIVE: Patient laying in bed, receiving breathing treatment, reports feeling better   Vitals:   04/09/20 1100 04/09/20 1200 04/09/20 1230 04/09/20 1330  BP: (!) 169/88 (!) 152/63 (!) 163/86 (!) 184/81  Pulse: (!) 122 78 93 100  Resp: (!) 22 19 20  (!) 27  Temp:  99 F (37.2 C)    TempSrc:  Oral    SpO2: 91% 92% 93% 92%  Weight:      Height:         Intake/Output Summary (Last 24 hours) at 04/09/2020 1406 Last data filed at 04/09/2020 1300 Gross per 24 hour  Intake 877.29 ml  Output 2500 ml  Net -1622.71 ml      PHYSICAL EXAM  General: Well developed, well nourished, in no acute distress HEENT:  Normocephalic and atramatic Neck:  No JVD.  Lungs: Clear bilaterally to auscultation and percussion. Heart: HRRR . Normal S1 and S2 without gallops or murmurs.  Abdomen: Bowel sounds are positive, abdomen soft and non-tender  Msk:  Back normal, normal gait. Normal strength and tone for age. Extremities: No clubbing, cyanosis or edema.   Neuro: Alert and oriented X 3. Psych:  Good affect, responds appropriately   LABS: Basic Metabolic Panel: Recent Labs    04/08/20 0415 04/09/20 0438  NA 139 143  K 3.7 2.9*  CL 84* 87*  CO2 37* 39*  GLUCOSE 158* 195*  BUN 25* 26*  CREATININE 0.61 0.58  CALCIUM 9.6 9.7  MG 1.8 2.3   Liver Function Tests: Recent Labs    04/09/20 0438  AST 16  ALT 18  ALKPHOS 81  BILITOT 1.9*  PROT 7.8  ALBUMIN 5.3*   Recent Labs    04/09/20 0438  LIPASE 27   CBC: Recent Labs    04/07/20 0416  WBC 8.6  HGB 14.1  HCT 45.5  MCV 91.9  PLT 177   Cardiac Enzymes: No results for input(s): CKTOTAL, CKMB, CKMBINDEX, TROPONINI in the last 72 hours. BNP: Invalid input(s): POCBNP D-Dimer: No results for input(s): DDIMER in the last 72 hours. Hemoglobin A1C: No results for input(s): HGBA1C in the last 72 hours. Fasting Lipid Panel: No results for input(s): CHOL, HDL, LDLCALC, TRIG, CHOLHDL, LDLDIRECT in the last  72 hours. Thyroid Function Tests: No results for input(s): TSH, T4TOTAL, T3FREE, THYROIDAB in the last 72 hours.  Invalid input(s): FREET3 Anemia Panel: No results for input(s): VITAMINB12, FOLATE, FERRITIN, TIBC, IRON, RETICCTPCT in the last 72 hours.  DG Chest 1 View  Result Date: 04/09/2020 CLINICAL DATA:  Acute respiratory failure EXAM: CHEST  1 VIEW COMPARISON:  04/03/2016 FINDINGS: Normal cardiac silhouette. Interval increase in interstitial edema pattern. No focal consolidation. No pneumothorax. IMPRESSION: Worsening interstitial edema. Electronically Signed   By: Suzy Bouchard M.D.   On: 04/09/2020 06:31   CT ANGIO CHEST PE W OR WO CONTRAST  Result Date: 04/07/2020 CLINICAL DATA:  Shortness of breath EXAM: CT ANGIOGRAPHY CHEST WITH CONTRAST TECHNIQUE: Multidetector CT imaging of the chest was performed using the standard protocol during bolus administration of intravenous contrast. Multiplanar CT image reconstructions and MIPs were obtained to evaluate the vascular anatomy. CONTRAST:  47mL OMNIPAQUE IOHEXOL 350 MG/ML SOLN COMPARISON:  April 06, 2020 FINDINGS: Cardiovascular: There is a optimal opacification of the pulmonary arteries. There is no central,segmental, or subsegmental filling defects within the pulmonary arteries. There is unchanged cardiomegaly. Mitral valve calcifications and coronary artery calcifications are seen. No pericardial effusion or thickening. No evidence right heart strain. There is  normal three-vessel brachiocephalic anatomy without proximal stenosis. Scattered aortic atherosclerosis is noted. Mediastinum/Nodes: No hilar, mediastinal, or axillary adenopathy. Thyroid gland, trachea, and esophagus demonstrate no significant findings. Lungs/Pleura: Patchy airspace opacity seen at the left upper lung and posterior right lung base with air bronchograms. There are trace bilateral pleural effusions present. Centrilobular and paraseptal emphysematous changes seen at  both lung apices. Upper Abdomen: No acute abnormalities present in the visualized portions of the upper abdomen. Musculoskeletal: No chest wall abnormality. No acute or significant osseous findings. There is a chronic anterior wedge compression deformity of the T12 vertebral body with 50% loss in vertebral body height. Review of the MIP images confirms the above findings. IMPRESSION: No central, segmental, or subsegmental pulmonary embolism. Unchanged multifocal patchy airspace opacities within both lungs which could be due to multifocal pneumonia Trace bilateral pleural effusions, right greater than left. Aortic Atherosclerosis (ICD10-I70.0). Electronically Signed   By: Prudencio Pair M.D.   On: 04/07/2020 19:43     Echo LVEF 55 to 60%  TELEMETRY: Sinus rhythm:  ASSESSMENT AND PLAN:  Principal Problem:   Acute CHF (congestive heart failure) (HCC) Active Problems:   COPD exacerbation (HCC)   Diabetes mellitus without complication ([place])   Current tobacco use   Hyperlipidemia   Essential hypertension   Hypothyroidism   Gout   Depression   Tremors of nervous system   Acute respiratory failure with hypoxia (HCC)   Cellulitis of lower extremity   Acute pulmonary edema (HCC)   Elevated brain natriuretic peptide (BNP) level   Nephrotic syndrome    1.  Acute on chronic diastolic congestive heart failure, diuresis, negative net 2 L over 24-hour, normal left ventricular function, with LVEF 55 to 60%, patient received dose of furosemide 40 mg IV today 2.  COPD, followed by pulmonary 3.  Metabolic encephalopathy 4.  Nephrotic syndrome, followed by nephrology 5.  Essential hypertension, on amlodipine and losartan, blood pressure remains mildly elevated  Recommendations  1.  Agree with overall current therapy 2.  Continue diuresis as needed 3.  Carefully monitor renal status 4.  Defer further cardiac diagnostics at this time     Isaias Cowman, MD, PhD, [facility] 04/09/2020 2:06  PM

## 2020-04-09 NOTE — Progress Notes (Signed)
Pulmonary Medicine          Date: 04/09/2020,   MRN# [id number] Martha [name] [birth year]     AdmissionWeight: 72 kg                 CurrentWeight: 69.2 kg  Referring physician: Dr. Si Raider    CHIEF COMPLAINT:   Acute on chronic hypoxemic respiratory failure   HISTORY OF PRESENT ILLNESS   74 year old female with history of diabetes COPD lifelong smoking hypothyroidism gout major depressive disorder, anxiety disorder and GERD, diverticulitis who came in with signs and symptoms of anasarca including peripheral edema and pulmonary edema.  Patient states is been worse than usual despite compliance with wrapping the lower extremities.  She also reported coughing and dyspnea.  Upon presentation to the emergency room she was found to be desaturating in the mid 80s and was placed on 4 L supplemental oxygen with some subsequent improvement of SPO2.  She was then treated with BiPAP and felt better.  She was initially admitted with acute exacerbation of CHF and cardiology was consulted for this.  She was also found to have proteinuria with CKD and had renal consultation placed. Pulmonary consultation placed for acute on chronic hypoxemic respiratory failure with failure to wean down oxygen.  Patient did have CT PE protocol done to rule out acute pulmonary venous embolism which was negative however did show bullous emphysema with overlying pulmonary edema as well as a right lower lobe consolidated infiltrate suggestive of pneumonia as well as right hemidiaphragm elevation due to hepatomegaly.  04/09/20- patient seen at bedside, remains encephalopathic.  Discussed with [facility] attending Dr Si Raider plan for transfer to SDU.       PAST MEDICAL HISTORY   Past Medical History:  Diagnosis Date  . Abnormal LFTs (liver function tests)   . Anxiety   . Arthritis    knees, feet  . Asthma   . Chronic bronchitis ([place])   . Chronic constipation   . Chronic low back pain   . Cigarette smoker    Has  cut back Smoking to 1 pack every other day  . COPD (chronic obstructive pulmonary disease) ([place])   . Depression   . Diabetic neuropathy ([place])   . Diverticulitis 2015   gi recommended repeat scope in 10 years  . Essential hypertension   . Fatty liver   . GERD (gastroesophageal reflux disease)   . Gout   . History of mammogram 05/28/2013  . Hyperlipidemia   . Hypothyroidism   . Insomnia   . Iron deficiency anemia   . Low serum vitamin D   . Personal history of tobacco use, presenting hazards to health 09/29/2015  . Plantar fasciitis   . RLS (restless legs syndrome)   . Shortness of breath dyspnea   . Thyroid cancer ([place]) 2009  . Tremors of nervous system    "I think I have parkinson's disease"  . Type 2 diabetes mellitus ([place])   . Wears dentures    uppers     SURGICAL HISTORY   Past Surgical History:  Procedure Laterality Date  . CATARACT EXTRACTION W/PHACO Left 11/24/2015   Procedure: CATARACT EXTRACTION PHACO AND INTRAOCULAR LENS PLACEMENT (IOC);  Surgeon: Birder Robson, MD;  Location: [facility] ORS;  Service: Ophthalmology;  Laterality: Left;  [place] 38.3 AP% 20.6 CDE 7.89 FLUID PACK LOT # [id number]  . CATARACT EXTRACTION W/PHACO Right 06/23/2019   Procedure: CATARACT EXTRACTION PHACO AND INTRAOCULAR LENS PLACEMENT (IOC) RIGHT DIABETIC;  Surgeon: George Ina,  Gwyndolyn Saxon, MD;  Location: [facility];  Service: Ophthalmology;  Laterality: Right;  Diabetic  . COLONOSCOPY  2004, 2009, 2015   [place]  . POLYPECTOMY  2009  . THYROIDECTOMY  2009   states she was tx with radiactive iodine  . TUBAL LIGATION    . UPPER GASTROINTESTINAL ENDOSCOPY       FAMILY HISTORY   Family History  Problem Relation Age of Onset  . Diabetes Mellitus II Father   . Lung disease Father   . Diabetes Mellitus II Mother   . Arthritis Mother   . Thyroid disease Other   . Asthma Sister   . Emphysema Sister   . Hypertension Sister   . Breast cancer Paternal Aunt      SOCIAL HISTORY   Social  History   Tobacco Use  . Smoking status: Current Every Day Smoker    Packs/day: 1.00    Years: 39.50    Pack years: 39.50    Types: Cigarettes  . Smokeless tobacco: Never Used  . Tobacco comment: curently smokes 12 cigerattes a day. I'm trying to cut back.  Vaping Use  . Vaping Use: Never used  Substance Use Topics  . Alcohol use: No    Alcohol/week: 0.0 standard drinks  . Drug use: No     MEDICATIONS    Home Medication:    Current Medication:  Current Facility-Administered Medications:  .  0.9 %  sodium chloride infusion, 250 mL, Intravenous, PRN, Ivor Costa, MD, Last Rate: 10 mL/hr at 04/09/20 0521, 250 mL at 04/09/20 0521 .  acetaminophen (TYLENOL) suppository 650 mg, 650 mg, Rectal, Q6H PRN, Lang Snow, NP, 650 mg at 04/08/20 2234 .  acetaminophen (TYLENOL) tablet 650 mg, 650 mg, Oral, Q6H PRN, Lorella Nimrod, MD, 650 mg at 04/06/20 1638 .  albumin human 25 % solution 25 g, 25 g, Intravenous, Q8H, Hall, Scott A, RPH, Last Rate: 60 mL/hr at 04/09/20 0044, 25 g at 04/09/20 0044 .  albuterol (PROVENTIL) (2.5 MG/3ML) 0.083% nebulizer solution 2.5 mg, 2.5 mg, Nebulization, Q4H PRN, Ivor Costa, MD .  amLODipine (NORVASC) tablet 10 mg, 10 mg, Oral, Daily, Wouk, Ailene Rud, MD, 10 mg at 04/08/20 7341 .  ascorbic acid (VITAMIN C) tablet 1,000 mg, 1,000 mg, Oral, Daily, Ivor Costa, MD, 1,000 mg at 04/07/20 0824 .  azithromycin (ZITHROMAX) tablet 500 mg, 500 mg, Oral, Daily, Wouk, Ailene Rud, MD .  colchicine tablet 0.6 mg, 0.6 mg, Oral, BID PRN, Ivor Costa, MD .  dextromethorphan-guaiFENesin [facility] DM) 30-600 MG per 12 hr tablet 1 tablet, 1 tablet, Oral, BID PRN, Ivor Costa, MD, 1 tablet at 04/06/20 0945 .  fentaNYL (SUBLIMAZE) injection 12.5 mcg, 12.5 mcg, Intravenous, Q2H PRN, Sharion Settler, NP, 12.5 mcg at 04/09/20 9379 .  fluticasone (FLONASE) 50 MCG/ACT nasal spray 1 spray, 1 spray, Each Nare, BID, Sharion Settler, NP, 1 spray at 04/07/20 2122 .   fluticasone (FLONASE) 50 MCG/ACT nasal spray 1-2 spray, 1-2 spray, Each Nare, Daily PRN, Ivor Costa, MD .  furosemide (LASIX) 200 mg in dextrose 5 % 100 mL (2 mg/mL) infusion, 6 mg/hr, Intravenous, Continuous, Wouk, Ailene Rud, MD .  gabapentin (NEURONTIN) tablet 300 mg, 300 mg, Oral, TID, Wouk, Ailene Rud, MD .  hydrALAZINE (APRESOLINE) injection 10 mg, 10 mg, Intravenous, Q2H PRN, Gwynne Edinger, MD, 10 mg at 04/09/20 0836 .  insulin aspart (novoLOG) injection 0-5 Units, 0-5 Units, Subcutaneous, QHS, Ivor Costa, MD, 3 Units at 04/04/20 2115 .  insulin aspart (novoLOG)  injection 0-9 Units, 0-9 Units, Subcutaneous, TID WC, Ivor Costa, MD, 3 Units at 04/09/20 [phone] .  ipratropium-albuterol (DUONEB) 0.5-2.5 (3) MG/3ML nebulizer solution 3 mL, 3 mL, Nebulization, TID, Ivor Costa, MD, 3 mL at 04/09/20 0730 .  levothyroxine (SYNTHROID) tablet 150 mcg, 150 mcg, Oral, Q0600, Ivor Costa, MD, 150 mcg at 04/08/20 0616 .  loratadine (CLARITIN) tablet 10 mg, 10 mg, Oral, Daily, Ivor Costa, MD, 10 mg at 04/07/20 0824 .  losartan (COZAAR) tablet 50 mg, 50 mg, Oral, Daily, Ivor Costa, MD, 50 mg at 04/06/20 0949 .  methylPREDNISolone sodium succinate (SOLU-MEDROL) 40 mg/mL injection 20 mg, 20 mg, Intravenous, Q12H, Lanney Gins, Salote Weidmann, MD, 20 mg at 04/09/20 0221 .  metoprolol tartrate (LOPRESSOR) injection 5 mg, 5 mg, Intravenous, Q6H PRN, Lang Snow, NP, 5 mg at 04/09/20 0618 .  nicotine (NICODERM CQ - dosed in mg/24 hours) patch 21 mg, 21 mg, Transdermal, Daily, Lateef, Munsoor, MD, 21 mg at 04/09/20 0855 .  nitroGLYCERIN (NITROSTAT) SL tablet 0.4 mg, 0.4 mg, Sublingual, Q5 min PRN, Wouk, Ailene Rud, MD, 0.4 mg at 04/08/20 0659 .  ondansetron (ZOFRAN) injection 4 mg, 4 mg, Intravenous, Q8H PRN, Ivor Costa, MD, 4 mg at 04/06/20 1543 .  piperacillin-tazobactam (ZOSYN) IVPB 3.375 g, 3.375 g, Intravenous, Q8H, Wouk, Ailene Rud, MD, Last Rate: 12.5 mL/hr at 04/09/20 0522, 3.375 g at 04/09/20 0522 .   potassium chloride 10 mEq in 100 mL IVPB, 10 mEq, Intravenous, Q1 Hr x 4, Wouk, Ailene Rud, MD, Last Rate: 100 mL/hr at 04/09/20 0808, 10 mEq at 04/09/20 0808 .  potassium chloride SA (KLOR-CON) CR tablet 40 mEq, 40 mEq, Oral, BID, Wouk, Ailene Rud, MD, 40 mEq at 04/08/20 6468 .  primidone (MYSOLINE) tablet 50 mg, 50 mg, Oral, BID, Ivor Costa, MD, 50 mg at 04/07/20 2121 .  rosuvastatin (CRESTOR) tablet 5 mg, 5 mg, Oral, Daily, Ivor Costa, MD, 5 mg at 04/07/20 0825 .  sertraline (ZOLOFT) tablet 25 mg, 25 mg, Oral, Daily, Wouk, Ailene Rud, MD .  sodium chloride flush (NS) 0.9 % injection 3 mL, 3 mL, Intravenous, Q12H, Ivor Costa, MD, 3 mL at 04/09/20 0321 .  sodium chloride flush (NS) 0.9 % injection 3 mL, 3 mL, Intravenous, PRN, Ivor Costa, MD .  tiZANidine (ZANAFLEX) tablet 2 mg, 2 mg, Oral, TID PRN, Ivor Costa, MD .  vitamin B-12 (CYANOCOBALAMIN) tablet 1,000 mcg, 1,000 mcg, Oral, Daily, Ivor Costa, MD, 1,000 mcg at 04/07/20 0825    ALLERGIES   Nicotine polacrilex, Nicotrol [nicotine], and Codeine sulfate     REVIEW OF SYSTEMS    Review of Systems:  Unable to obtain due to encephalopathy   VS: BP (!) 200/66 (BP Location: Right Arm)   Pulse (!) 106   Temp 98.8 F (37.1 C) (Oral)   Resp 18   Ht '5\' 4"'  (1.626 m)   Wt 69.2 kg   SpO2 99%   BMI 26.19 kg/m      PHYSICAL EXAM    GENERAL:NAD, no fevers, chills, no weakness no fatigue HEAD: Normocephalic, atraumatic.  EYES: Pupils equal, round, reactive to light. Extraocular muscles intact. No scleral icterus.  MOUTH: dry mucosal membrane. Dentition intact. No abscess noted. On BIPAP EAR, NOSE, THROAT: Clear without exudates. No external lesions.  NECK: Supple. No thyromegaly. No nodules. No JVD.  PULMONARY: mild rhonchi bilaterally  CARDIOVASCULAR: S1 and S2. Regular rate and rhythm. No murmurs, rubs, or gallops. No edema. Pedal pulses 2+ bilaterally.  GASTROINTESTINAL: Soft, nontender, nondistended. No masses.  Positive bowel sounds. No hepatosplenomegaly.  MUSCULOSKELETAL: No swelling, clubbing, +edema 2++.  NEUROLOGIC: GCS 10  SKIN: No ulceration, lesions, rashes, or cyanosis. Skin warm and dry. Turgor intact.       IMAGING    DG Chest 1 View  Result Date: 04/09/2020 CLINICAL DATA:  Acute respiratory failure EXAM: CHEST  1 VIEW COMPARISON:  04/03/2016 FINDINGS: Normal cardiac silhouette. Interval increase in interstitial edema pattern. No focal consolidation. No pneumothorax. IMPRESSION: Worsening interstitial edema. Electronically Signed   By: Suzy Bouchard M.D.   On: 04/09/2020 06:31   DG Chest 2 View  Result Date: 04/03/2020 CLINICAL DATA:  Hypoxia. Leg edema and shortness of breath. Pt daughter states that pt is supposed to be getting a renal bx but they are not able to do it because of the edema in pts legs. Pt states that she does feel short of breath. Pt does not wear oxygen at home. Pt placed on O2 in triage due to sats being in the 80s. Hx of asthma, chronic bronchitis, COPD, thyroid cancer- 2009, DM, current smoker. EXAM: CHEST - 2 VIEW COMPARISON:  01/04/2018 FINDINGS: Cardiac silhouette is borderline enlarged. Stable aortic atherosclerotic calcifications. No mediastinal or hilar masses. No evidence of adenopathy. Lungs demonstrate diffuse bilateral irregular interstitial thickening which has increased compared to the prior study. There is also linear/reticular scarring in the left lung base, which is stable. No lung consolidation. No pleural effusion and no pneumothorax. Skeletal structures are demineralized, but grossly intact. IMPRESSION: 1. Bilateral irregular interstitial thickening, increased compared to the prior study, along with borderline cardiomegaly. Suspect mild congestive heart failure. Consider diffuse interstitial infection or inflammation in the proper clinical setting. No evidence of lobar pneumonia. Electronically Signed   By: Lajean Manes M.D.   On: 04/03/2020 13:06    CT CHEST W CONTRAST  Result Date: 04/06/2020 CLINICAL DATA:  Hemoptysis. EXAM: CT CHEST WITH CONTRAST TECHNIQUE: Multidetector CT imaging of the chest was performed during intravenous contrast administration. CONTRAST:  58m OMNIPAQUE IOHEXOL 300 MG/ML  SOLN COMPARISON:  January 06, 2019 FINDINGS: Cardiovascular: There is marked severity calcification of the aortic arch. There is mild cardiomegaly. No pericardial effusion. Mediastinum/Nodes: There is mild pretracheal and right hilar lymphadenopathy. Thyroid gland, trachea, and esophagus demonstrate no significant findings. Lungs/Pleura: Mild-to-moderate severity emphysematous lung disease is seen involving the bilateral upper lobes with stable bullous disease is seen along the medial aspect of the left apex. Mild to moderate severity areas of atelectasis and/or infiltrate are seen within the posterior aspects of the bilateral upper lobes and left lung base. Moderate to marked severity posterior right basilar atelectasis and/or infiltrate is noted. A very small right pleural effusion is seen. No pneumothorax is identified. Upper Abdomen: There is stable hepatosplenomegaly with stable, moderate severity diffuse bilateral adrenal gland enlargement. Musculoskeletal: A chronic lateral eighth left rib fracture is seen. A chronic compression fracture deformity of the T12 vertebral body is noted. IMPRESSION: 1. Mild-to-moderate severity posterior bilateral upper lobe and left basilar atelectasis and/or infiltrate. 2. Moderate to marked severity posterior right basilar atelectasis and/or infiltrate. 3. Very small right pleural effusion. 4. Mild cardiomegaly. 5. Stable emphysematous lung disease. 6. Chronic lateral eighth left rib fracture. 7. Chronic compression fracture deformity of the T12 vertebral body. 8. Aortic atherosclerosis. Aortic Atherosclerosis (ICD10-I70.0) and Emphysema (ICD10-J43.9). Electronically Signed   By: TVirgina NorfolkM.D.   On: 04/06/2020  15:33   CT ANGIO CHEST PE W OR WO CONTRAST  Result Date: 04/07/2020 CLINICAL DATA:  Shortness of  breath EXAM: CT ANGIOGRAPHY CHEST WITH CONTRAST TECHNIQUE: Multidetector CT imaging of the chest was performed using the standard protocol during bolus administration of intravenous contrast. Multiplanar CT image reconstructions and MIPs were obtained to evaluate the vascular anatomy. CONTRAST:  61m OMNIPAQUE IOHEXOL 350 MG/ML SOLN COMPARISON:  April 06, 2020 FINDINGS: Cardiovascular: There is a optimal opacification of the pulmonary arteries. There is no central,segmental, or subsegmental filling defects within the pulmonary arteries. There is unchanged cardiomegaly. Mitral valve calcifications and coronary artery calcifications are seen. No pericardial effusion or thickening. No evidence right heart strain. There is normal three-vessel brachiocephalic anatomy without proximal stenosis. Scattered aortic atherosclerosis is noted. Mediastinum/Nodes: No hilar, mediastinal, or axillary adenopathy. Thyroid gland, trachea, and esophagus demonstrate no significant findings. Lungs/Pleura: Patchy airspace opacity seen at the left upper lung and posterior right lung base with air bronchograms. There are trace bilateral pleural effusions present. Centrilobular and paraseptal emphysematous changes seen at both lung apices. Upper Abdomen: No acute abnormalities present in the visualized portions of the upper abdomen. Musculoskeletal: No chest wall abnormality. No acute or significant osseous findings. There is a chronic anterior wedge compression deformity of the T12 vertebral body with 50% loss in vertebral body height. Review of the MIP images confirms the above findings. IMPRESSION: No central, segmental, or subsegmental pulmonary embolism. Unchanged multifocal patchy airspace opacities within both lungs which could be due to multifocal pneumonia Trace bilateral pleural effusions, right greater than left. Aortic  Atherosclerosis (ICD10-I70.0). Electronically Signed   By: BPrudencio PairM.D.   On: 04/07/2020 19:43   [place]Venous Img Lower Bilateral (DVT)  Result Date: 04/03/2020 CLINICAL DATA:  Lower leg cellulitis EXAM: BILATERAL LOWER EXTREMITY VENOUS DOPPLER ULTRASOUND TECHNIQUE: Gray-scale sonography with graded compression, as well as color Doppler and duplex ultrasound were performed to evaluate the lower extremity deep venous systems from the level of the common femoral vein and including the common femoral, femoral, profunda femoral, popliteal and calf veins including the posterior tibial, peroneal and gastrocnemius veins when visible. The superficial great saphenous vein was also interrogated. Spectral Doppler was utilized to evaluate flow at rest and with distal augmentation maneuvers in the common femoral, femoral and popliteal veins. COMPARISON:  None. FINDINGS: RIGHT LOWER EXTREMITY Common Femoral Vein: No evidence of thrombus. Normal compressibility, respiratory phasicity and response to augmentation. Saphenofemoral Junction: No evidence of thrombus. Normal compressibility and flow on color Doppler imaging. Profunda Femoral Vein: No evidence of thrombus. Normal compressibility and flow on color Doppler imaging. Femoral Vein: No evidence of thrombus. Normal compressibility, respiratory phasicity and response to augmentation. Popliteal Vein: Not well visualized due to overlying edema. Calf Veins: Not well visualized due to overlying edema. Superficial Great Saphenous Vein: No evidence of thrombus. Normal compressibility. Venous Reflux:  None. Other Findings:  None. LEFT LOWER EXTREMITY Common Femoral Vein: No evidence of thrombus. Normal compressibility, respiratory phasicity and response to augmentation. Saphenofemoral Junction: No evidence of thrombus. Normal compressibility and flow on color Doppler imaging. Profunda Femoral Vein: No evidence of thrombus. Normal compressibility and flow on color Doppler imaging.  Femoral Vein: No evidence of thrombus. Normal compressibility, respiratory phasicity and response to augmentation. Popliteal Vein: Not well visualized due to overlying edema. Calf Veins: Not well visualized due to overlying edema. Superficial Great Saphenous Vein: No evidence of thrombus. Normal compressibility. Venous Reflux:  None. Other Findings:  None. IMPRESSION: Somewhat limited exam due to peripheral edema although no central deep venous thrombosis is noted. Electronically Signed   By: MLinus MakoD.  On: 04/03/2020 15:58   ECHOCARDIOGRAM COMPLETE  Result Date: 04/04/2020    ECHOCARDIOGRAM REPORT   Patient Name:   Martha [name] Date of Exam: 04/04/2020 Medical Rec #:  [id number]      Height:       64.0 in Accession #:    [id number]     Weight:       181.8 lb Date of Birth:  07-23-1945     BSA:          1.879 m Patient Age:    17 years       BP:           171/78 mmHg Patient Gender: F              HR:           98 bpm. Exam Location:  [facility] Procedure: 2D Echo, Color Doppler, Cardiac Doppler and Intracardiac            Opacification Agent Indications:     I50.31 CHF-Acute Diastolic  History:         Patient has no prior history of Echocardiogram examinations.                  COPD, Signs/Symptoms:Shortness of Breath; Risk Factors:Diabetes                  and Current Smoker.  Sonographer:     Charmayne Sheer RDCS (AE) Referring Phys:  Baker Janus Soledad Gerlach NIU Diagnosing Phys: Bartholome Bill MD  Sonographer Comments: Technically difficult study due to poor echo windows. Image acquisition challenging due to patient body habitus and Image acquisition challenging due to COPD. IMPRESSIONS  1. Left ventricular ejection fraction, by estimation, is 55 to 60%. The left ventricle has normal function. The left ventricle has no regional wall motion abnormalities. Left ventricular diastolic parameters are consistent with Grade I diastolic dysfunction (impaired relaxation).  2. Right ventricular systolic function is normal. The right  ventricular size is mildly enlarged.  3. Left atrial size was mildly dilated.  4. Right atrial size was mildly dilated.  5. The mitral valve was not well visualized. Trivial mitral valve regurgitation.  6. The aortic valve was not well visualized. Aortic valve regurgitation is trivial. FINDINGS  Left Ventricle: Left ventricular ejection fraction, by estimation, is 55 to 60%. The left ventricle has normal function. The left ventricle has no regional wall motion abnormalities. Definity contrast agent was given IV to delineate the left ventricular  endocardial borders. The left ventricular internal cavity size was normal in size. There is borderline left ventricular hypertrophy. Left ventricular diastolic parameters are consistent with Grade I diastolic dysfunction (impaired relaxation). Right Ventricle: The right ventricular size is mildly enlarged. No increase in right ventricular wall thickness. Right ventricular systolic function is normal. Left Atrium: Left atrial size was mildly dilated. Right Atrium: Right atrial size was mildly dilated. Pericardium: There is no evidence of pericardial effusion. Mitral Valve: The mitral valve was not well visualized. Trivial mitral valve regurgitation. MV peak gradient, 7.4 mmHg. The mean mitral valve gradient is 3.0 mmHg. Tricuspid Valve: The tricuspid valve is not well visualized. Tricuspid valve regurgitation is trivial. Aortic Valve: The aortic valve was not well visualized. Aortic valve regurgitation is trivial. Aortic valve mean gradient measures 3.0 mmHg. Aortic valve peak gradient measures 6.8 mmHg. Aortic valve area, by VTI measures 2.46 cm. Pulmonic Valve: The pulmonic valve was not well visualized. Pulmonic valve regurgitation is not visualized. Aorta: The aortic root was not  well visualized. IAS/Shunts: The interatrial septum was not assessed.  LEFT VENTRICLE PLAX 2D LVIDd:         4.15 cm     Diastology LVIDs:         3.64 cm     LV e' medial:    5.98 cm/s LV PW:          1.43 cm     LV E/e' medial:  18.3 LV IVS:        1.12 cm     LV e' lateral:   6.53 cm/s LVOT diam:     2.00 cm     LV E/e' lateral: 16.8 LV SV:         55 LV SV Index:   29 LVOT Area:     3.14 cm  LV Volumes (MOD) LV vol d, MOD A2C: 55.5 ml LV vol d, MOD A4C: 63.8 ml LV vol s, MOD A2C: 34.3 ml LV vol s, MOD A4C: 23.4 ml LV SV MOD A2C:     21.2 ml LV SV MOD A4C:     63.8 ml LV SV MOD BP:      34.2 ml RIGHT VENTRICLE RV Basal diam:  3.76 cm LEFT ATRIUM             Index       RIGHT ATRIUM           Index LA diam:        4.90 cm 2.61 cm/m  RA Area:     19.80 cm LA Vol (A2C):   59.5 ml 31.67 ml/m RA Volume:   61.10 ml  32.52 ml/m LA Vol (A4C):   57.5 ml 30.61 ml/m LA Biplane Vol: 61.2 ml 32.58 ml/m  AORTIC VALVE                   PULMONIC VALVE AV Area (Vmax):    2.51 cm    PV Vmax:       0.76 m/s AV Area (Vmean):   2.43 cm    PV Vmean:      53.200 cm/s AV Area (VTI):     2.46 cm    PV VTI:        0.126 m AV Vmax:           130.00 cm/s PV Peak grad:  2.3 mmHg AV Vmean:          83.500 cm/s PV Mean grad:  1.0 mmHg AV VTI:            0.225 m AV Peak Grad:      6.8 mmHg AV Mean Grad:      3.0 mmHg LVOT Vmax:         104.00 cm/s LVOT Vmean:        64.700 cm/s LVOT VTI:          0.176 m LVOT/AV VTI ratio: 0.78  AORTA Ao Root diam: 3.10 cm MITRAL VALVE                TRICUSPID VALVE MV Area (PHT): 3.00 cm     TR Peak grad:   28.9 mmHg MV Peak grad:  7.4 mmHg     TR Vmax:        269.00 cm/s MV Mean grad:  3.0 mmHg MV Vmax:       1.36 m/s     SHUNTS MV Vmean:      86.9 cm/s    Systemic VTI:  0.18 m  MV Decel Time: 253 msec     Systemic Diam: 2.00 cm MV E velocity: 109.50 cm/s MV A velocity: 137.50 cm/s MV E/A ratio:  0.80 Bartholome Bill MD Electronically signed by Bartholome Bill MD Signature Date/Time: 04/04/2020/4:54:02 PM    Final       ASSESSMENT/PLAN   Acute on chronic hypoxemic respiratory failure -Due to acute exacerbation of CHF as well as right lower lobe pneumonia with pulmonary edema -BNP is  severely elevated over 2500  -Patient presented with mild leukocytosis with left shift suggestive of possible infectious etiology -Nephrology on case appreciate input patient is currently on Lasix drip-04/09/20- reviewed care plan with nephrology team Dr Juleen China - plan for continued diuresis with albumin -Agree with vancomycin and Zosyn-we will obtain a procalcitonin trend to guide de-escalation -Would recommend to DC vancomycin since MRSA PCR is negative -Solu-Medrol 20 mg twice daily due to COPD with exacerbation -ABG-metablic alk with resp compensation acute -due to most likely volume contracted state -Procalcitonin trend -transfer to step down   Altered mental status with confusion -Patient on gabapentin, fentanyl, Ativan, Zanaflex, Zoloft, Requip, oxycodone -Daughter is present during my examination and reports that patient does not take any of these medications on a regular basis at home, specifically she states that Zoloft is taken as needed when patient feels sad, and gabapentin is only taken seldomly.  Additionally she mentioned that when patient does take gabapentin she generally goes into deep sleep from 1 pill. -Recommend decreasing centrally acting medication -will obtain ABG to evaluate hypercanic encephalopathy -continue on BIPAP       Thank you for allowing me to participate in the care of this patient.    Patient/Family are satisfied with care plan and all questions have been answered.   This document was prepared using Dragon voice recognition software and may include unintentional dictation errors.     Ottie Glazier, M.D.  Division of [facility]

## 2020-04-09 NOTE — Progress Notes (Addendum)
Patient remains altered, hypertensive and in Afib with RVR. Concerns for flush pulmonary edeman. Patient also c/o generalized headache without associated symptoms. She was unable to provide further description.  No focal neurologic deficit noted.  Acute Hypoxic Hypercapnic Respiratory Failure secondary to Acute on chronic CHF -COPD with evidence of acute exacerbation. -Supplemental O2 as needed to maintain O2 saturations 88 to 92% -BiPAP, wean as tolerated -High risk for intubation -Follow intermittent ABG and chest x-ray as needed -Repeat CXR this morning shows worsening interstitial edema -IV Lasix as blood pressure and renal function permits; currently not on Lasix -As needed bronchodilators -Follow repeat labs in am.     -Acute Metabolic Encephalopathy due to Hypercapnia -Provide supportive care -BiPAP to treat Hypercapnia          Rufina Falco, BSN, MSN, DNP, CCRN,FNP-C  [facility] Nurse Practitioner  [facility]

## 2020-04-09 NOTE — Progress Notes (Addendum)
Pt was complaining of pain 4/10 and was moaning. PRN Fentanyl was given, but pt still moaning and complaining of pain. Talked to NP Ouma and place a one time order of morphine 2 mg IV once and order a one time dose of levothyroxine 75 mcg IV once. Pt unable to follow commands and was NPO at this time. Will continue to monitor.

## 2020-04-09 NOTE — Progress Notes (Signed)
   04/09/20 0824  Assess: MEWS Score  Level of Consciousness Responds to Voice  O2 Device Nasal Cannula  O2 Flow Rate (L/min) 10 L/min  Assess: MEWS Score  MEWS Temp 0  MEWS Systolic 0  MEWS Pulse 1  MEWS RR 0  MEWS LOC 1  MEWS Score 2  MEWS Score Color Yellow  Assess: if the MEWS score is Yellow or Red  Were vital signs taken at a resting state? Yes  Focused Assessment Change from prior assessment (see assessment flowsheet)  Early Detection of Sepsis Score *See Row Information* Low  MEWS guidelines implemented *See Row Information* No, previously red, continue vital signs every 4 hours  Treat  MEWS Interventions Administered prn meds/treatments;Other (Comment) (Transfer orders in to ICU)  Pain Scale 0-10  Faces Pain Scale 8  Pain Type Acute pain  Pain Intervention(s) Medication (See eMAR)  Take Vital Signs  Increase Vital Sign Frequency  Yellow: Q 2hr X 2 then Q 4hr X 2, if remains yellow, continue Q 4hrs  Escalate  MEWS: Escalate Yellow: discuss with charge nurse/RN and consider discussing with provider and RRT  Notify: Charge Nurse/RN  Name of Charge Nurse/RN Notified Linard Millers  Date Charge Nurse/RN Notified 04/09/20  Time Charge Nurse/RN Notified [phone]  Document  Patient Outcome Other (Comment) (cont to monitor)  Progress note created (see row info) Yes

## 2020-04-09 NOTE — Progress Notes (Signed)
[facility]  ROUNDING NOTE   Subjective:   Short of breath. Moved to ICU step down unit. Placed on  BIPAP.  Furosemide gtt stopped. Was given furosemide bolus this morning.  IV albumin stopped.   Imaging consistent with right lower lobe infiltrate.   UOP 2472mL  Objective:  Vital signs in last 24 hours:  Temp:  [98 F (36.7 C)-99.9 F (37.7 C)] 99.9 F (37.7 C) (12/11 1034) Pulse Rate:  [59-129] 122 (12/11 1100) Resp:  [18-23] 22 (12/11 1100) BP: (139-202)/(53-99) 169/88 (12/11 1100) SpO2:  [90 %-100 %] 91 % (12/11 1100) FiO2 (%):  [60 %] 60 % (12/10 1400) Weight:  [66.9 kg-69.2 kg] 66.9 kg (12/11 1034)  Weight change: -3.13 kg Filed Weights   04/08/20 0351 04/09/20 0406 04/09/20 1034  Weight: 72.3 kg 69.2 kg 66.9 kg    Intake/Output: I/O last 3 completed shifts: In: 650.9 [P.O.:240; I.V.:139.5; IV Piggyback:271.4] Out: 4200 [Urine:4200]   Intake/Output this shift:  Total I/O In: -  Out: 100 [Urine:100]  Physical Exam: General:  Ill appearing  Head:  Normocephalic, atraumatic   Eyes:  Anicteric  Lungs:   right sided rhonchi, no crackles  Heart:  regular  Abdomen:   Soft, nontender, nondistended  Extremities:  No peripheral edema  Neurologic:  Lethargic  Skin:  skin tenting    Basic Metabolic Panel: Recent Labs  Lab 04/05/20 0546 04/06/20 0426 04/06/20 1253 04/07/20 0416 04/08/20 0415 04/09/20 0438  NA 135 134*  --  138 139 143  K 4.2 3.2*  --  3.6 3.7 2.9*  CL 92* 86*  --  88* 84* 87*  CO2 32 36*  --  36* 37* 39*  GLUCOSE 147* 132*  --  90 158* 195*  BUN 17 17  --  25* 25* 26*  CREATININE 0.71 0.65  --  0.65 0.61 0.58  CALCIUM 8.1* 8.3*  --  8.7* 9.6 9.7  MG 2.2 1.8 1.7 2.1 1.8 2.3    Liver Function Tests: Recent Labs  Lab 04/03/20 1141 04/09/20 0438  AST 19 16  ALT 11 18  ALKPHOS 89 81  BILITOT 0.4 1.9*  PROT 6.7 7.8  ALBUMIN 2.7* 5.3*   Recent Labs  Lab 04/09/20 0438  LIPASE 27   Recent Labs  Lab  04/08/20 1002  AMMONIA 34    CBC: Recent Labs  Lab 04/03/20 1141 04/04/20 0608 04/07/20 0416  WBC 10.7* 13.8* 8.6  NEUTROABS 9.4*  --   --   HGB 17.5* 16.4* 14.1  HCT 53.7* 51.7* 45.5  MCV 89.6 88.8 91.9  PLT 296 236 177    Cardiac Enzymes: No results for input(s): CKTOTAL, CKMB, CKMBINDEX, TROPONINI in the last 168 hours.  BNP: Invalid input(s): POCBNP  CBG: Recent Labs  Lab 04/08/20 1230 04/08/20 1751 04/08/20 2119 04/09/20 0820 04/09/20 1036  GLUCAP 201* 214* 176* 205* 220*    Microbiology: Results for orders placed or performed during the hospital encounter of 04/03/20  Resp Panel by RT-PCR (Flu A&B, Covid) Nasopharyngeal Swab     Status: None   Collection Time: 04/03/20 11:41 AM   Specimen: Nasopharyngeal Swab; Nasopharyngeal(NP) swabs in vial transport medium  Result Value Ref Range Status   SARS Coronavirus 2 by RT PCR NEGATIVE NEGATIVE Final    Comment: (NOTE) SARS-CoV-2 target nucleic acids are NOT DETECTED.  The SARS-CoV-2 RNA is generally detectable in upper respiratory specimens during the acute phase of infection. The lowest concentration of SARS-CoV-2 viral copies this assay can detect  is 138 copies/mL. A negative result does not preclude SARS-Cov-2 infection and should not be used as the sole basis for treatment or other patient management decisions. A negative result may occur with  improper specimen collection/handling, submission of specimen other than nasopharyngeal swab, presence of viral mutation(s) within the areas targeted by this assay, and inadequate number of viral copies(<138 copies/mL). A negative result must be combined with clinical observations, patient history, and epidemiological information. The expected result is Negative.  Fact Sheet for Patients:  EntrepreneurPulse.com.au  Fact Sheet for Healthcare Providers:  IncredibleEmployment.be  This test is no t yet approved or cleared by  the [place] FDA and  has been authorized for detection and/or diagnosis of SARS-CoV-2 by FDA under an Emergency Use Authorization (EUA). This EUA will remain  in effect (meaning this test can be used) for the duration of the COVID-19 declaration under Section 564(b)(1) of the Act, 21 U.S.C.section 360bbb-3(b)(1), unless the authorization is terminated  or revoked sooner.       Influenza A by PCR NEGATIVE NEGATIVE Final   Influenza B by PCR NEGATIVE NEGATIVE Final    Comment: (NOTE) The Xpert Xpress SARS-CoV-2/FLU/RSV plus assay is intended as an aid in the diagnosis of influenza from Nasopharyngeal swab specimens and should not be used as a sole basis for treatment. Nasal washings and aspirates are unacceptable for Xpert Xpress SARS-CoV-2/FLU/RSV testing.  Fact Sheet for Patients: EntrepreneurPulse.com.au  Fact Sheet for Healthcare Providers: IncredibleEmployment.be  This test is not yet approved or cleared by the [place] FDA and has been authorized for detection and/or diagnosis of SARS-CoV-2 by FDA under an Emergency Use Authorization (EUA). This EUA will remain in effect (meaning this test can be used) for the duration of the COVID-19 declaration under Section 564(b)(1) of the Act, 21 U.S.C. section 360bbb-3(b)(1), unless the authorization is terminated or revoked.  Performed at [facility], [address] [place], [place] [address]   Culture, blood (Routine X 2) w Reflex to ID Panel     Status: None   Collection Time: 04/03/20  3:27 PM   Specimen: BLOOD  Result Value Ref Range Status   Specimen Description BLOOD BLOOD RIGHT ARM  Final   Special Requests   Final    BOTTLES DRAWN AEROBIC AND ANAEROBIC Blood Culture adequate volume   Culture   Final    NO GROWTH 5 DAYS Performed at [facility], [address] [place], [place] [address]    Report Status 04/08/2020 FINAL  Final  Urine Culture      Status: Abnormal   Collection Time: 04/03/20  4:00 PM   Specimen: Urine, Random  Result Value Ref Range Status   Specimen Description   Final    URINE, RANDOM Performed at [facility], [address], [place], [place] [address]    Special Requests   Final    NONE Performed at [facility], [address], [place], [place] [address]    Culture (A)  Final    >=100,000 COLONIES/mL ESCHERICHIA COLI >=100,000 COLONIES/mL AEROCOCCUS URINAE Standardized susceptibility testing for this organism is not available. Performed at [facility], [place] [address] [place],  [address]    Report Status 04/07/2020 FINAL  Final   Organism ID, Bacteria ESCHERICHIA COLI (A)  Final      Susceptibility   Escherichia coli - MIC*    AMPICILLIN 4 SENSITIVE Sensitive     CEFAZOLIN <=4 SENSITIVE Sensitive     CEFEPIME <=0.12  SENSITIVE Sensitive     CEFTRIAXONE <=0.25 SENSITIVE Sensitive     CIPROFLOXACIN <=0.25 SENSITIVE Sensitive     GENTAMICIN <=1 SENSITIVE Sensitive     IMIPENEM <=0.25 SENSITIVE Sensitive     NITROFURANTOIN 32 SENSITIVE Sensitive     TRIMETH/SULFA <=20 SENSITIVE Sensitive     AMPICILLIN/SULBACTAM <=2 SENSITIVE Sensitive     PIP/TAZO <=4 SENSITIVE Sensitive     * >=100,000 COLONIES/mL ESCHERICHIA COLI  Culture, blood (Routine X 2) w Reflex to ID Panel     Status: None   Collection Time: 04/03/20 10:36 PM   Specimen: BLOOD  Result Value Ref Range Status   Specimen Description BLOOD BLOOD LEFT HAND  Final   Special Requests   Final    BOTTLES DRAWN AEROBIC AND ANAEROBIC Blood Culture adequate volume   Culture   Final    NO GROWTH 5 DAYS Performed at [facility], [address] [place], [place] [address]    Report Status 04/08/2020 FINAL  Final  CULTURE, BLOOD (ROUTINE X 2) w Reflex to ID Panel     Status: None (Preliminary result)   Collection Time: 04/08/20 10:02 AM   Specimen: BLOOD  Result Value Ref Range Status   Specimen  Description BLOOD BLOOD RIGHT HAND  Final   Special Requests   Final    BOTTLES DRAWN AEROBIC AND ANAEROBIC Blood Culture results may not be optimal due to an inadequate volume of blood received in culture bottles   Culture   Final    NO GROWTH < 24 HOURS Performed at [facility], [address] [place], [place] [address]    Report Status PENDING  Incomplete  MRSA PCR Screening     Status: None   Collection Time: 04/08/20 10:04 AM   Specimen: Nasal Mucosa; Nasopharyngeal  Result Value Ref Range Status   MRSA by PCR NEGATIVE NEGATIVE Final    Comment:        The GeneXpert MRSA Assay (FDA approved for NASAL specimens only), is one component of a comprehensive MRSA colonization surveillance program. It is not intended to diagnose MRSA infection nor to guide or monitor treatment for MRSA infections. Performed at [facility], [address] [place], [place] [address]   CULTURE, BLOOD (ROUTINE X 2) w Reflex to ID Panel     Status: None (Preliminary result)   Collection Time: 04/08/20 10:26 AM   Specimen: BLOOD  Result Value Ref Range Status   Specimen Description BLOOD BLOOD RIGHT HAND  Final   Special Requests   Final    BOTTLES DRAWN AEROBIC AND ANAEROBIC Blood Culture results may not be optimal due to an inadequate volume of blood received in culture bottles   Culture   Final    NO GROWTH < 24 HOURS Performed at [facility], [address], [place], [place] [address]    Report Status PENDING  Incomplete    Coagulation Studies: Recent Labs    04/07/20 0756  LABPROT 13.8  INR 1.1    Urinalysis: Recent Labs    04/08/20 0952  COLORURINE YELLOW*  LABSPEC 1.014  PHURINE 8.0  GLUCOSEU NEGATIVE  HGBUR NEGATIVE  BILIRUBINUR NEGATIVE  KETONESUR 5*  PROTEINUR >=300*  NITRITE NEGATIVE  LEUKOCYTESUR NEGATIVE      Imaging: DG Chest 1 View  Result Date: 04/09/2020 CLINICAL DATA:  Acute respiratory failure EXAM: CHEST  1 VIEW  COMPARISON:  04/03/2016 FINDINGS: Normal cardiac silhouette. Interval increase in interstitial edema pattern. No focal consolidation. No pneumothorax.  IMPRESSION: Worsening interstitial edema. Electronically Signed   By: Suzy Bouchard M.D.   On: 04/09/2020 06:31   CT ANGIO CHEST PE W OR WO CONTRAST  Result Date: 04/07/2020 CLINICAL DATA:  Shortness of breath EXAM: CT ANGIOGRAPHY CHEST WITH CONTRAST TECHNIQUE: Multidetector CT imaging of the chest was performed using the standard protocol during bolus administration of intravenous contrast. Multiplanar CT image reconstructions and MIPs were obtained to evaluate the vascular anatomy. CONTRAST:  9mL OMNIPAQUE IOHEXOL 350 MG/ML SOLN COMPARISON:  April 06, 2020 FINDINGS: Cardiovascular: There is a optimal opacification of the pulmonary arteries. There is no central,segmental, or subsegmental filling defects within the pulmonary arteries. There is unchanged cardiomegaly. Mitral valve calcifications and coronary artery calcifications are seen. No pericardial effusion or thickening. No evidence right heart strain. There is normal three-vessel brachiocephalic anatomy without proximal stenosis. Scattered aortic atherosclerosis is noted. Mediastinum/Nodes: No hilar, mediastinal, or axillary adenopathy. Thyroid gland, trachea, and esophagus demonstrate no significant findings. Lungs/Pleura: Patchy airspace opacity seen at the left upper lung and posterior right lung base with air bronchograms. There are trace bilateral pleural effusions present. Centrilobular and paraseptal emphysematous changes seen at both lung apices. Upper Abdomen: No acute abnormalities present in the visualized portions of the upper abdomen. Musculoskeletal: No chest wall abnormality. No acute or significant osseous findings. There is a chronic anterior wedge compression deformity of the T12 vertebral body with 50% loss in vertebral body height. Review of the MIP images confirms the above  findings. IMPRESSION: No central, segmental, or subsegmental pulmonary embolism. Unchanged multifocal patchy airspace opacities within both lungs which could be due to multifocal pneumonia Trace bilateral pleural effusions, right greater than left. Aortic Atherosclerosis (ICD10-I70.0). Electronically Signed   By: Prudencio Pair M.D.   On: 04/07/2020 19:43     Medications:   . sodium chloride 250 mL (04/09/20 1146)  . piperacillin-tazobactam (ZOSYN)  IV 3.375 g (04/09/20 0522)  . potassium chloride 10 mEq (04/09/20 1125)   . amLODipine  10 mg Oral Daily  . vitamin C  1,000 mg Oral Daily  . azithromycin  500 mg Oral Daily  . Chlorhexidine Gluconate Cloth  6 each Topical Daily  . fluticasone  1 spray Each Nare BID  . gabapentin  300 mg Oral TID  . insulin aspart  0-5 Units Subcutaneous QHS  . insulin aspart  0-9 Units Subcutaneous TID WC  . ipratropium-albuterol  3 mL Nebulization TID  . levothyroxine  150 mcg Oral Q0600  . loratadine  10 mg Oral Daily  . losartan  50 mg Oral Daily  . methylPREDNISolone (SOLU-MEDROL) injection  20 mg Intravenous Q12H  . nicotine  21 mg Transdermal Daily  . potassium chloride  40 mEq Oral BID  . primidone  50 mg Oral BID  . rosuvastatin  5 mg Oral Daily  . sertraline  25 mg Oral Daily  . sodium chloride flush  3 mL Intravenous Q12H  . vitamin B-12  1,000 mcg Oral Daily   sodium chloride, acetaminophen, acetaminophen, albuterol, dextromethorphan-guaiFENesin, fentaNYL (SUBLIMAZE) injection, fluticasone, hydrALAZINE, metoprolol tartrate, nitroGLYCERIN, ondansetron (ZOFRAN) IV, sodium chloride flush, tiZANidine  Assessment/ Plan:  74 y.o. female  Ms. PRISILLA KOCSIS is a 74 y.o. 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  #Proteinuria, 17 g.   # Nephrotic syndrome.   - will need renal biopsy in future - currently  hypovolemic on examination - discontinued furosemide  and IV albumin - Encourage PO intake.   #Hypokalemia - secondary to loop diuretics.  - PO potassium chloride.   #Metabolic alkalosis with compensated respiratory acidosis and with metabolic acidosis  #Acute hypoxic respiratory failure  With right sided consolidation. Empiric antibiotics Noninvasive ventilation.   #Hypertension Elevated. On losartan and amlodipine  #Diabetes mellitus type 2 Lab Results  Component Value Date   HGBA1C 6.5 (H) 04/03/2020   Continue insulin aspart   LOS: 6 Gwyn Hieronymus 12/11/202112:03 PM

## 2020-04-09 NOTE — Progress Notes (Signed)
   04/09/20 0005  Assess: MEWS Score  Temp 98.4 F (36.9 C)  BP (!) 202/74  Pulse Rate 93  SpO2 99 %  Assess: MEWS Score  MEWS Temp 0  MEWS Systolic 2  MEWS Pulse 0  MEWS RR 0  MEWS LOC 1  MEWS Score 3  MEWS Score Color Yellow  Assess: if the MEWS score is Yellow or Red  Were vital signs taken at a resting state? Yes  Focused Assessment No change from prior assessment  Early Detection of Sepsis Score *See Row Information* Low  MEWS guidelines implemented *See Row Information* Yes  Treat  MEWS Interventions Administered prn meds/treatments  2nd Pain Site  Pain Score 0  Wong-Baker Pain Rating 0  Take Vital Signs  Increase Vital Sign Frequency  Yellow: Q 2hr X 2 then Q 4hr X 2, if remains yellow, continue Q 4hrs  Notify: Charge Nurse/RN  Name of Charge Nurse/RN Notified Alcide Evener  Date Charge Nurse/RN Notified 04/09/20  Time Charge Nurse/RN Notified 0018

## 2020-04-10 LAB — URINALYSIS, COMPLETE (UACMP) WITH MICROSCOPIC
Bacteria, UA: NONE SEEN
Bilirubin Urine: NEGATIVE
Glucose, UA: 50 mg/dL — AB
Ketones, ur: NEGATIVE mg/dL
Nitrite: NEGATIVE
Protein, ur: >=300 mg/dL — AB
RBC / HPF: >50 RBC/hpf — ABNORMAL HIGH (ref 0–5)
Specific Gravity, Urine: 1.017 (ref 1.005–1.030)
pH: 6 (ref 5.0–8.0)

## 2020-04-10 LAB — COMPREHENSIVE METABOLIC PANEL
ALT: 15 U/L (ref 0–44)
AST: 15 U/L (ref 15–41)
Albumin: 4.9 g/dL (ref 3.5–5.0)
Alkaline Phosphatase: 82 U/L (ref 38–126)
Anion gap: 16 — ABNORMAL HIGH (ref 5–15)
BUN: 39 mg/dL — ABNORMAL HIGH (ref 8–23)
CO2: 40 mmol/L — ABNORMAL HIGH (ref 22–32)
Calcium: 9.7 mg/dL (ref 8.9–10.3)
Chloride: 93 mmol/L — ABNORMAL LOW (ref 98–111)
Creatinine, Ser: 0.56 mg/dL (ref 0.44–1.00)
GFR, Estimated: >60 mL/min (ref >60)
Glucose, Bld: 195 mg/dL — ABNORMAL HIGH (ref 70–99)
Potassium: 2.4 mmol/L — CL (ref 3.5–5.1)
Sodium: 149 mmol/L — ABNORMAL HIGH (ref 135–145)
Total Bilirubin: 1.8 mg/dL — ABNORMAL HIGH (ref 0.3–1.2)
Total Protein: 7.5 g/dL (ref 6.5–8.1)

## 2020-04-10 LAB — CBC
HCT: 51.4 % — ABNORMAL HIGH (ref 36.0–46.0)
Hemoglobin: 15.9 g/dL — ABNORMAL HIGH (ref 12.0–15.0)
MCH: 28.9 pg (ref 26.0–34.0)
MCHC: 30.9 g/dL (ref 30.0–36.0)
MCV: 93.3 fL (ref 80.0–100.0)
Platelets: 173 10*3/uL (ref 150–400)
RBC: 5.51 MIL/uL — ABNORMAL HIGH (ref 3.87–5.11)
RDW: 17.7 % — ABNORMAL HIGH (ref 11.5–15.5)
WBC: 11 10*3/uL — ABNORMAL HIGH (ref 4.0–10.5)
nRBC: 0 % (ref 0.0–0.2)

## 2020-04-10 LAB — BASIC METABOLIC PANEL
Anion gap: 15 (ref 5–15)
BUN: 42 mg/dL — ABNORMAL HIGH (ref 8–23)
CO2: 38 mmol/L — ABNORMAL HIGH (ref 22–32)
Calcium: 9.2 mg/dL (ref 8.9–10.3)
Chloride: 94 mmol/L — ABNORMAL LOW (ref 98–111)
Creatinine, Ser: 0.61 mg/dL (ref 0.44–1.00)
GFR, Estimated: >60 mL/min (ref >60)
Glucose, Bld: 228 mg/dL — ABNORMAL HIGH (ref 70–99)
Potassium: 3.1 mmol/L — ABNORMAL LOW (ref 3.5–5.1)
Sodium: 147 mmol/L — ABNORMAL HIGH (ref 135–145)

## 2020-04-10 LAB — TECHNOLOGIST SMEAR REVIEW: Plt Morphology: NONE SEEN

## 2020-04-10 LAB — GLUCOSE, CAPILLARY
Glucose-Capillary: 222 mg/dL — ABNORMAL HIGH (ref 70–99)
Glucose-Capillary: 219 mg/dL — ABNORMAL HIGH (ref 70–99)
Glucose-Capillary: 204 mg/dL — ABNORMAL HIGH (ref 70–99)
Glucose-Capillary: 224 mg/dL — ABNORMAL HIGH (ref 70–99)
Glucose-Capillary: 241 mg/dL — ABNORMAL HIGH (ref 70–99)

## 2020-04-10 LAB — MAGNESIUM: Magnesium: 2.4 mg/dL (ref 1.7–2.4)

## 2020-04-10 LAB — BLOOD GAS, ARTERIAL
Acid-Base Excess: 36.8 mmol/L — ABNORMAL HIGH (ref 0.0–2.0)
Bicarbonate: 66.2 mmol/L — ABNORMAL HIGH (ref 20.0–28.0)
Delivery systems: POSITIVE
Expiratory PAP: 6
FIO2: 0.5
Inspiratory PAP: 14
O2 Saturation: 97.1 %
Patient temperature: 37
pCO2 arterial: 69 mmHg (ref 32.0–48.0)
pH, Arterial: 7.59 — ABNORMAL HIGH (ref 7.350–7.450)
pO2, Arterial: 78 mmHg — ABNORMAL LOW (ref 83.0–108.0)

## 2020-04-10 LAB — LACTATE DEHYDROGENASE: LDH: 149 U/L (ref 98–192)

## 2020-04-10 LAB — BILIRUBIN, FRACTIONATED(TOT/DIR/INDIR)
Bilirubin, Direct: 0.4 mg/dL — ABNORMAL HIGH (ref 0.0–0.2)
Indirect Bilirubin: 1.1 mg/dL — ABNORMAL HIGH (ref 0.3–0.9)
Total Bilirubin: 1.5 mg/dL — ABNORMAL HIGH (ref 0.3–1.2)

## 2020-04-10 LAB — PROTIME-INR
INR: 1.1 (ref 0.8–1.2)
Prothrombin Time: 14.2 seconds (ref 11.4–15.2)

## 2020-04-10 MED ORDER — LABETALOL HCL 5 MG/ML IV SOLN
20.0000 mg | INTRAVENOUS | Status: DC | PRN
  Administered 2020-04-11 – 2020-04-15 (×7): 20 mg via INTRAVENOUS
  Filled 2020-04-10 (×7): qty 4

## 2020-04-10 MED ORDER — POTASSIUM CHLORIDE 10 MEQ/100ML IV SOLN
10.0000 meq | INTRAVENOUS | Status: AC
  Administered 2020-04-10 (×3): 10 meq via INTRAVENOUS
  Filled 2020-04-10 (×3): qty 100

## 2020-04-10 MED ORDER — DEXTROSE 5 % IV SOLN: INTRAVENOUS | Status: DC

## 2020-04-10 MED ORDER — POTASSIUM CHLORIDE 10 MEQ/100ML IV SOLN
10.0000 meq | INTRAVENOUS | Status: AC
Start: 2020-04-10 — End: 2020-04-10
  Administered 2020-04-10 (×6): 10 meq via INTRAVENOUS
  Filled 2020-04-10 (×6): qty 100

## 2020-04-10 NOTE — Progress Notes (Signed)
Pt moans when voiding. Post void bladder scan showed 657 cc in bladder. MD made aware and new orders received. In and out catheter released 700 cc urine from pt. UA and cultures sent. Pt is more awake and pulling at bi-pap, becoming agitated. Family at bedside calming pt. Will continue to monitor.

## 2020-04-10 NOTE — Progress Notes (Signed)
[facility]  ROUNDING NOTE   Subjective:   Daughter at bedside. Patient transferred to stepdown UOP 400 recorded Bipap   Objective:  Vital signs in last 24 hours:  Temp:  [97.5 F (36.4 C)-99.2 F (37.3 C)] 97.5 F (36.4 C) (12/12 0800) Pulse Rate:  [38-163] 124 (12/12 1000) Resp:  [12-27] 20 (12/12 1000) BP: (141-194)/(49-127) 169/82 (12/12 1120) SpO2:  [80 %-94 %] 93 % (12/12 1000)  Weight change: -2.319 kg Filed Weights   04/08/20 0351 04/09/20 0406 04/09/20 1034  Weight: 72.3 kg 69.2 kg 66.9 kg    Intake/Output: I/O last 3 completed shifts: In: 688 [I.V.:40.5; IV Piggyback:647.5] Out: 2000 [Urine:2000]   Intake/Output this shift:  Total I/O In: 351.4 [I.V.:71.4; IV Piggyback:280] Out: 100 [Urine:100]  Physical Exam: General:  Ill appearing  Head:  BIPAP  Eyes:  Anicteric  Lungs:   right sided rhonchi, no crackles  Heart:  regular  Abdomen:   Soft, nontender, nondistended  Extremities:  No peripheral edema  Neurologic:  Lethargic  Skin:  skin tenting    Basic Metabolic Panel: Recent Labs  Lab 04/06/20 0426 04/06/20 1253 04/07/20 0416 04/08/20 0415 04/09/20 0438 04/10/20 0517  NA 134*  --  138 139 143 149*  K 3.2*  --  3.6 3.7 2.9* 2.4*  CL 86*  --  88* 84* 87* 93*  CO2 36*  --  36* 37* 39* 40*  GLUCOSE 132*  --  90 158* 195* 195*  BUN 17  --  25* 25* 26* 39*  CREATININE 0.65  --  0.65 0.61 0.58 0.56  CALCIUM 8.3*  --  8.7* 9.6 9.7 9.7  MG 1.8 1.7 2.1 1.8 2.3 2.4    Liver Function Tests: Recent Labs  Lab 04/03/20 1141 04/09/20 0438 04/10/20 0517 04/10/20 1040  AST 19 16 15   --   ALT 11 18 15   --   ALKPHOS 89 81 82  --   BILITOT 0.4 1.9* 1.8* 1.5*  PROT 6.7 7.8 7.5  --   ALBUMIN 2.7* 5.3* 4.9  --    Recent Labs  Lab 04/09/20 0438  LIPASE 27   Recent Labs  Lab 04/08/20 1002  AMMONIA 34    CBC: Recent Labs  Lab 04/03/20 1141 04/04/20 0608 04/07/20 0416 04/10/20 0517  WBC 10.7* 13.8* 8.6 11.0*  NEUTROABS  9.4*  --   --   --   HGB 17.5* 16.4* 14.1 15.9*  HCT 53.7* 51.7* 45.5 51.4*  MCV 89.6 88.8 91.9 93.3  PLT 296 236 177 173    Cardiac Enzymes: No results for input(s): CKTOTAL, CKMB, CKMBINDEX, TROPONINI in the last 168 hours.  BNP: Invalid input(s): POCBNP  CBG: Recent Labs  Lab 04/09/20 1036 04/09/20 1133 04/09/20 1620 04/09/20 2229 04/10/20 0827  GLUCAP 220* 207* 232* 224* 222*    Microbiology: Results for orders placed or performed during the hospital encounter of 04/03/20  Resp Panel by RT-PCR (Flu A&B, Covid) Nasopharyngeal Swab     Status: None   Collection Time: 04/03/20 11:41 AM   Specimen: Nasopharyngeal Swab; Nasopharyngeal(NP) swabs in vial transport medium  Result Value Ref Range Status   SARS Coronavirus 2 by RT PCR NEGATIVE NEGATIVE Final    Comment: (NOTE) SARS-CoV-2 target nucleic acids are NOT DETECTED.  The SARS-CoV-2 RNA is generally detectable in upper respiratory specimens during the acute phase of infection. The lowest concentration of SARS-CoV-2 viral copies this assay can detect is 138 copies/mL. A negative result does not preclude SARS-Cov-2 infection  and should not be used as the sole basis for treatment or other patient management decisions. A negative result may occur with  improper specimen collection/handling, submission of specimen other than nasopharyngeal swab, presence of viral mutation(s) within the areas targeted by this assay, and inadequate number of viral copies(<138 copies/mL). A negative result must be combined with clinical observations, patient history, and epidemiological information. The expected result is Negative.  Fact Sheet for Patients:  EntrepreneurPulse.com.au  Fact Sheet for Healthcare Providers:  IncredibleEmployment.be  This test is no t yet approved or cleared by the [place] FDA and  has been authorized for detection and/or diagnosis of SARS-CoV-2 by FDA under an  Emergency Use Authorization (EUA). This EUA will remain  in effect (meaning this test can be used) for the duration of the COVID-19 declaration under Section 564(b)(1) of the Act, 21 U.S.C.section 360bbb-3(b)(1), unless the authorization is terminated  or revoked sooner.       Influenza A by PCR NEGATIVE NEGATIVE Final   Influenza B by PCR NEGATIVE NEGATIVE Final    Comment: (NOTE) The Xpert Xpress SARS-CoV-2/FLU/RSV plus assay is intended as an aid in the diagnosis of influenza from Nasopharyngeal swab specimens and should not be used as a sole basis for treatment. Nasal washings and aspirates are unacceptable for Xpert Xpress SARS-CoV-2/FLU/RSV testing.  Fact Sheet for Patients: EntrepreneurPulse.com.au  Fact Sheet for Healthcare Providers: IncredibleEmployment.be  This test is not yet approved or cleared by the [place] FDA and has been authorized for detection and/or diagnosis of SARS-CoV-2 by FDA under an Emergency Use Authorization (EUA). This EUA will remain in effect (meaning this test can be used) for the duration of the COVID-19 declaration under Section 564(b)(1) of the Act, 21 U.S.C. section 360bbb-3(b)(1), unless the authorization is terminated or revoked.  Performed at [facility], [address] [place], [place] [address]   Culture, blood (Routine X 2) w Reflex to ID Panel     Status: None   Collection Time: 04/03/20  3:27 PM   Specimen: BLOOD  Result Value Ref Range Status   Specimen Description BLOOD BLOOD RIGHT ARM  Final   Special Requests   Final    BOTTLES DRAWN AEROBIC AND ANAEROBIC Blood Culture adequate volume   Culture   Final    NO GROWTH 5 DAYS Performed at [facility], [address] [place], [place] [address]    Report Status 04/08/2020 FINAL  Final  Urine Culture     Status: Abnormal   Collection Time: 04/03/20  4:00 PM   Specimen: Urine, Random  Result Value Ref Range Status    Specimen Description   Final    URINE, RANDOM Performed at [facility], [address] [place], [place] [address]    Special Requests   Final    NONE Performed at [facility], [address], [place], [place] [address]    Culture (A)  Final    >=100,000 COLONIES/mL ESCHERICHIA COLI >=100,000 COLONIES/mL AEROCOCCUS URINAE Standardized susceptibility testing for this organism is not available. Performed at [facility], [place] [address] [place], [place] [address]    Report Status 04/07/2020 FINAL  Final   Organism ID, Bacteria ESCHERICHIA COLI (A)  Final      Susceptibility   Escherichia coli - MIC*    AMPICILLIN 4 SENSITIVE Sensitive     CEFAZOLIN <=4 SENSITIVE Sensitive     CEFEPIME <=0.12 SENSITIVE Sensitive     CEFTRIAXONE <=0.25 SENSITIVE Sensitive  CIPROFLOXACIN <=0.25 SENSITIVE Sensitive     GENTAMICIN <=1 SENSITIVE Sensitive     IMIPENEM <=0.25 SENSITIVE Sensitive     NITROFURANTOIN 32 SENSITIVE Sensitive     TRIMETH/SULFA <=20 SENSITIVE Sensitive     AMPICILLIN/SULBACTAM <=2 SENSITIVE Sensitive     PIP/TAZO <=4 SENSITIVE Sensitive     * >=100,000 COLONIES/mL ESCHERICHIA COLI  Culture, blood (Routine X 2) w Reflex to ID Panel     Status: None   Collection Time: 04/03/20 10:36 PM   Specimen: BLOOD  Result Value Ref Range Status   Specimen Description BLOOD BLOOD LEFT HAND  Final   Special Requests   Final    BOTTLES DRAWN AEROBIC AND ANAEROBIC Blood Culture adequate volume   Culture   Final    NO GROWTH 5 DAYS Performed at [facility], [address], [place], [place] [address]    Report Status 04/08/2020 FINAL  Final  Urine Culture     Status: None   Collection Time: 04/08/20  9:52 AM   Specimen: Urine, Random  Result Value Ref Range Status   Specimen Description   Final    URINE, RANDOM Performed at [facility], [address] [place], [place] [address]    Special Requests   Final    NONE Performed at  [facility], [address] [place], [place] [address]    Culture   Final    NO GROWTH Performed at [facility], [place] [address] [place], [place] [address]    Report Status 04/09/2020 FINAL  Final  CULTURE, BLOOD (ROUTINE X 2) w Reflex to ID Panel     Status: None (Preliminary result)   Collection Time: 04/08/20 10:02 AM   Specimen: BLOOD  Result Value Ref Range Status   Specimen Description BLOOD BLOOD RIGHT HAND  Final   Special Requests   Final    BOTTLES DRAWN AEROBIC AND ANAEROBIC Blood Culture results may not be optimal due to an inadequate volume of blood received in culture bottles   Culture   Final    NO GROWTH 2 DAYS Performed at [facility], [address], [place], [place] [address]    Report Status PENDING  Incomplete  MRSA PCR Screening     Status: None   Collection Time: 04/08/20 10:04 AM   Specimen: Nasal Mucosa; Nasopharyngeal  Result Value Ref Range Status   MRSA by PCR NEGATIVE NEGATIVE Final    Comment:        The GeneXpert MRSA Assay (FDA approved for NASAL specimens only), is one component of a comprehensive MRSA colonization surveillance program. It is not intended to diagnose MRSA infection nor to guide or monitor treatment for MRSA infections. Performed at [facility], [address] [place], [place] [address]   CULTURE, BLOOD (ROUTINE X 2) w Reflex to ID Panel     Status: None (Preliminary result)   Collection Time: 04/08/20 10:26 AM   Specimen: BLOOD  Result Value Ref Range Status   Specimen Description BLOOD BLOOD RIGHT HAND  Final   Special Requests   Final    BOTTLES DRAWN AEROBIC AND ANAEROBIC Blood Culture results may not be optimal due to an inadequate volume of blood received in culture bottles   Culture   Final    NO GROWTH 2 DAYS Performed at [facility], [address] [place], [place] [address]    Report Status PENDING  Incomplete    Coagulation Studies: Recent Labs  04/10/20 1040  LABPROT 14.2  INR 1.1    Urinalysis: Recent Labs    04/08/20 0952  COLORURINE YELLOW*  LABSPEC 1.014  PHURINE 8.0  GLUCOSEU NEGATIVE  HGBUR NEGATIVE  BILIRUBINUR NEGATIVE  KETONESUR 5*  PROTEINUR >=300*  NITRITE NEGATIVE  LEUKOCYTESUR NEGATIVE      Imaging: DG Chest 1 View  Result Date: 04/09/2020 CLINICAL DATA:  Acute respiratory failure EXAM: CHEST  1 VIEW COMPARISON:  04/03/2016 FINDINGS: Normal cardiac silhouette. Interval increase in interstitial edema pattern. No focal consolidation. No pneumothorax. IMPRESSION: Worsening interstitial edema. Electronically Signed   By: Suzy Bouchard M.D.   On: 04/09/2020 06:31     Medications:   . sodium chloride Stopped (04/10/20 8592)  . dextrose 50 mL/hr at 04/10/20 0941  . piperacillin-tazobactam (ZOSYN)  IV 12.5 mL/hr at 04/10/20 0833  . potassium chloride 10 mEq (04/10/20 1047)   . amLODipine  10 mg Oral Daily  . vitamin C  1,000 mg Oral Daily  . azithromycin  500 mg Oral Daily  . Chlorhexidine Gluconate Cloth  6 each Topical Daily  . fluticasone  1 spray Each Nare BID  . insulin aspart  0-5 Units Subcutaneous QHS  . insulin aspart  0-9 Units Subcutaneous TID WC  . ipratropium-albuterol  3 mL Nebulization TID  . levothyroxine  150 mcg Oral Q0600  . loratadine  10 mg Oral Daily  . losartan  50 mg Oral Daily  . methylPREDNISolone (SOLU-MEDROL) injection  20 mg Intravenous Q12H  . nicotine  21 mg Transdermal Daily  . potassium chloride  40 mEq Oral BID  . rosuvastatin  5 mg Oral Daily  . sodium chloride flush  3 mL Intravenous Q12H  . vitamin B-12  1,000 mcg Oral Daily   sodium chloride, acetaminophen, acetaminophen, albuterol, dextromethorphan-guaiFENesin, fentaNYL (SUBLIMAZE) injection, fluticasone, hydrALAZINE, metoprolol tartrate, nitroGLYCERIN, ondansetron (ZOFRAN) IV, sodium chloride flush  Assessment/ Plan:  74 y.o. female  Martha [name] is a 73 y.o. 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  #Proteinuria, 17 g.   # Nephrotic syndrome.   - will need renal biopsy in future - currently hypovolemic on examination - Start D5W infusion  #Hypokalemia - secondary to loop diuretics.  - replace with potassium chloride.   #Metabolic alkalosis with compensated respiratory acidosis and with metabolic acidosis  #Acute hypoxic respiratory failure  With right sided consolidation. Empiric antibiotics and systemic steroids.  Noninvasive ventilation.   #Hypertension Elevated. On losartan and amlodipine  #Diabetes mellitus type 2 Lab Results  Component Value Date   HGBA1C 6.5 (H) 04/03/2020   Continue insulin aspart   LOS: 7 Harith Mccadden 12/12/202111:38 AM

## 2020-04-10 NOTE — Progress Notes (Addendum)
PROGRESS NOTE    Martha [name]  RFX:[id number] DOB: 1946/04/17 DOA: 04/03/2020 PCP: Tracie Harrier, MD   Brief Narrative: Taken from H&P Martha [name] is a 74 y.o. female with medical history significant of  HTN, HLD, DM, COPD, tobacco abuse, hypothyroidism, gout, depression, tremor, proteinuria, depression, anxiety, GERD, diverticulitis, who presents with bilateral leg edema and pain, and SOB.  Patient states that she has chronic bilateral leg edema which has been progressively worsened recently. Both lower legs are painful and erythematous. She states that she gets her lower extremities wrapped once a week.  She has chronic cough and shortness of breath which has slightly worsened on exertion.  She coughs up some white mucus.  No fever or chills.  No chest pain.  On presentation she was hypoxic requiring BiPAP, later transitioned to 4 L of oxygen. Patient also has an history of protein urea which is being worked up by nephrology as an outpatient and they are planning for biopsy.  Patient had pertinent labs positive for BNP of 1113, troponin and COVID-19 negative.  Chest x-ray with borderline cardiomegaly and mild pulmonary edema.  Significant edema of bilateral lower extremities with signs of venous congestion, skin peeling and few shallow ulcers.  Cardiology was consulted.  Subjective: Overnight remained altered, didn't tolerate bipap for extended periods, currently on 12 HF [place]. No longer complaining of abdominal pain, no n/v.   Assessment & Plan:   Principal Problem:   Acute CHF (congestive heart failure) (HCC) Active Problems:   COPD exacerbation (HCC)   Diabetes mellitus without complication ([facility])   Current tobacco use   Hyperlipidemia   Essential hypertension   Hypothyroidism   Gout   Depression   Tremors of nervous system   Acute respiratory failure with hypoxia (HCC)   Cellulitis of lower extremity   Acute pulmonary edema (HCC)   Elevated brain natriuretic peptide  (BNP) level   Nephrotic syndrome  Acute toxic metabolic encephalopathy Remains altered, suspect infection and hypoxemia/hypercarbia are driving this. mrsa pcr neg. abg shows alkalosis (likely contraction) with hypercarbia and hypoxia. 7.59/69/78. Hemodynamically stable. Volume depleted from aggressive diuresis for nephrotic syndrome - cont zosyn/azith - cont solu-medrol - fluids d5 @ 50 - continue step down - pulm following, will discuss care w/ them today  Abdominal pain  On 12/10, has not recurred, would not sit still for CT. - monitor  Hyperbilirubinemia T bili 1.8, LFTs wnl - check smear, inr, fractionated bili, ldh, hapto - think unlikely bili/hepatic given normal lfts and alk phos  Urinary retention Post-void bladder scan this afternoon shows residual of 600. May be secondary to hydralazine, will d/c. Will also d/c loratadine - will I and o cath prn, continue to track post-void residuals - check ua/culture  Acute hypoxic respiratory failure due to combination of acute on chronic CHF and COPD exacerbation with right lung pneumonia.  TTE w/ diastolic dysfunction. Here bnp elevated and uptrending. S/p aggressive diuresis. Normal stress testing done in 2019.  CT obtained 12/8 for hemoptysis w/ atelectasis vs multifocal pna. covid neg on admission. CTA neg for PE. Troponins mildly elevated and flat, do not think acs - off lasix/albumin - cong bipap - cont zosyn/azithromycin - cont solu-medrol - unable to give sputum sample - blood cultures ngtd  Hemoptysis  Small volume, blood tinged sputum. No signs respiratory compromise. Suspect 2/2 coughing. Ct w/ contrast showing atelectasis/infiltrate, neg for PE  Proteinuria/nephrotic syndrome.  Patient is being managed by nephrology as an outpatient.  Can be contributing  to her lower extremity edema.  Nephrology is following and they start her on Lasix infusion instead of boluses. - kidney biopsy now on hold - off  lasix/albumin  Bilateral lower extremity venous stasis ulcerss.   Wound care consulted, compression wrapped. S/p ceftriaxone/keflex 5 days. Edema much improved - continue compression therapy  Bacteriuria Patient was unable to specify UTI symptoms but appears pan positive. UA with pyuria, bacteriuria and positive nitrites. -Urine culture growing E. Coli pan sensitive - s/p 5 day course ceftriaxone>keflex  Hypokalemia 2.4 today, mg normal.  - kcl 10 IV x6 - repeat bmp PM  Type 2 diabetes mellitus.  A1c of 6.5.  Patient was on Metformin and glipizide at home. -Continue with SSI.  Tobacco abuse.  Patient is a heavy smoker, smokes all day long. - nicotine patch  Essential hypertension.   BP elevated -Continue with Cozaar. - added amlodipine -Continue with as needed hydralazine.  Hypothyroidism. -Continue with home dose of Synthroid  Hyperlipidemia. -Continue with Crestor.  History of gout.  No acute concerns.  History of depression. - given mental status have stopped sertraline  History of tremors. -stop home primidone given encephalopathy  Chronic pain - hold home oxy, have weaned off gabapentin  Objective: Vitals:   04/10/20 0600 04/10/20 0700 04/10/20 0800 04/10/20 0840  BP: (!) 168/82 (!) 186/81 (!) 159/113 (!) 192/58  Pulse: 75 62 (!) 110   Resp: '16 16 17   ' Temp:   (!) 97.5 F (36.4 C)   TempSrc:   Axillary   SpO2: 92% 92% 92%   Weight:      Height:        Intake/Output Summary (Last 24 hours) at 04/10/2020 0949 Last data filed at 04/10/2020 7121 Gross per 24 hour  Intake 839.4 ml  Output 500 ml  Net 339.4 ml   Filed Weights   04/08/20 0351 04/09/20 0406 04/09/20 1034  Weight: 72.3 kg 69.2 kg 66.9 kg    Examination:  General.  Obese elderly lady, in bed sleeping, opens eyes, anwers simple questions with yes no answers Pulmonary.  Decreased sounds on right, exp wheeze faint CV.  Tachycardic, rr, soft syscolic murmur Abdomen.  Mild distention,  no ttp CNS.  Moving all 4 extremities Extremities.  1+ LE edema, bilateral lower extremities with compression bandages Psychiatry.  Unable to assess  DVT prophylaxis: Lovenox Code Status: Full Family Communication: Daughter was updated @ bedside 12/12 Disposition Plan: TBD Status is: Inpatient  Remains inpatient appropriate because:Inpatient level of care appropriate due to severity of illness   Dispo: The patient is from: Home              Anticipated d/c is to: SNF (has bed, will just need covid test day of discharge)              Anticipated d/c date is: tbd              Patient currently is not medically stable to d/c.    Consultants:   Cardiology  Pulmonology  Nephrology  Procedures:  Antimicrobials:  Ceftriaxone>keflex, finished zosyn/azithromycin 12/10>  Data Reviewed: I have personally reviewed following labs and imaging studies  CBC: Recent Labs  Lab 04/03/20 1141 04/04/20 0608 04/07/20 0416 04/10/20 0517  WBC 10.7* 13.8* 8.6 11.0*  NEUTROABS 9.4*  --   --   --   HGB 17.5* 16.4* 14.1 15.9*  HCT 53.7* 51.7* 45.5 51.4*  MCV 89.6 88.8 91.9 93.3  PLT 296 236 177 173  Basic Metabolic Panel: Recent Labs  Lab 04/06/20 0426 04/06/20 1253 04/07/20 0416 04/08/20 0415 04/09/20 0438 04/10/20 0517  NA 134*  --  138 139 143 149*  K 3.2*  --  3.6 3.7 2.9* 2.4*  CL 86*  --  88* 84* 87* 93*  CO2 36*  --  36* 37* 39* 40*  GLUCOSE 132*  --  90 158* 195* 195*  BUN 17  --  25* 25* 26* 39*  CREATININE 0.65  --  0.65 0.61 0.58 0.56  CALCIUM 8.3*  --  8.7* 9.6 9.7 9.7  MG 1.8 1.7 2.1 1.8 2.3 2.4   GFR: Estimated Creatinine Clearance: 57.2 mL/min (by C-G formula based on SCr of 0.56 mg/dL). Liver Function Tests: Recent Labs  Lab 04/03/20 1141 04/09/20 0438 04/10/20 0517  AST '19 16 15  ' ALT '11 18 15  ' ALKPHOS 89 81 82  BILITOT 0.4 1.9* 1.8*  PROT 6.7 7.8 7.5  ALBUMIN 2.7* 5.3* 4.9   Recent Labs  Lab 04/09/20 0438  LIPASE 27   Recent Labs  Lab  04/08/20 1002  AMMONIA 34   Coagulation Profile: Recent Labs  Lab 04/04/20 0608 04/07/20 0756  INR 1.0 1.1   Cardiac Enzymes: No results for input(s): CKTOTAL, CKMB, CKMBINDEX, TROPONINI in the last 168 hours. BNP (last 3 results) No results for input(s): PROBNP in the last 8760 hours. HbA1C: No results for input(s): HGBA1C in the last 72 hours. CBG: Recent Labs  Lab 04/09/20 1036 04/09/20 1133 04/09/20 1620 04/09/20 2229 04/10/20 0827  GLUCAP 220* 207* 232* 224* 222*   Lipid Profile: No results for input(s): CHOL, HDL, LDLCALC, TRIG, CHOLHDL, LDLDIRECT in the last 72 hours. Thyroid Function Tests: No results for input(s): TSH, T4TOTAL, FREET4, T3FREE, THYROIDAB in the last 72 hours. Anemia Panel: No results for input(s): VITAMINB12, FOLATE, FERRITIN, TIBC, IRON, RETICCTPCT in the last 72 hours. Sepsis Labs: Recent Labs  Lab 04/03/20 1141 04/04/20 0608 04/07/20 0416 04/08/20 1222  PROCALCITON <0.10 <0.10 <0.10 <0.10    Recent Results (from the past 240 hour(s))  Resp Panel by RT-PCR (Flu A&B, Covid) Nasopharyngeal Swab     Status: None   Collection Time: 04/03/20 11:41 AM   Specimen: Nasopharyngeal Swab; Nasopharyngeal(NP) swabs in vial transport medium  Result Value Ref Range Status   SARS Coronavirus 2 by RT PCR NEGATIVE NEGATIVE Final    Comment: (NOTE) SARS-CoV-2 target nucleic acids are NOT DETECTED.  The SARS-CoV-2 RNA is generally detectable in upper respiratory specimens during the acute phase of infection. The lowest concentration of SARS-CoV-2 viral copies this assay can detect is 138 copies/mL. A negative result does not preclude SARS-Cov-2 infection and should not be used as the sole basis for treatment or other patient management decisions. A negative result may occur with  improper specimen collection/handling, submission of specimen other than nasopharyngeal swab, presence of viral mutation(s) within the areas targeted by this assay, and  inadequate number of viral copies(<138 copies/mL). A negative result must be combined with clinical observations, patient history, and epidemiological information. The expected result is Negative.  Fact Sheet for Patients:  EntrepreneurPulse.com.au  Fact Sheet for Healthcare Providers:  IncredibleEmployment.be  This test is no t yet approved or cleared by the [place] FDA and  has been authorized for detection and/or diagnosis of SARS-CoV-2 by FDA under an Emergency Use Authorization (EUA). This EUA will remain  in effect (meaning this test can be used) for the duration of the COVID-19 declaration under Section 564(b)(1) of the  Act, 21 U.S.C.section 360bbb-3(b)(1), unless the authorization is terminated  or revoked sooner.       Influenza A by PCR NEGATIVE NEGATIVE Final   Influenza B by PCR NEGATIVE NEGATIVE Final    Comment: (NOTE) The Xpert Xpress SARS-CoV-2/FLU/RSV plus assay is intended as an aid in the diagnosis of influenza from Nasopharyngeal swab specimens and should not be used as a sole basis for treatment. Nasal washings and aspirates are unacceptable for Xpert Xpress SARS-CoV-2/FLU/RSV testing.  Fact Sheet for Patients: EntrepreneurPulse.com.au  Fact Sheet for Healthcare Providers: IncredibleEmployment.be  This test is not yet approved or cleared by the [place] FDA and has been authorized for detection and/or diagnosis of SARS-CoV-2 by FDA under an Emergency Use Authorization (EUA). This EUA will remain in effect (meaning this test can be used) for the duration of the COVID-19 declaration under Section 564(b)(1) of the Act, 21 U.S.C. section 360bbb-3(b)(1), unless the authorization is terminated or revoked.  Performed at [facility], [address] [place], [place] [address]   Culture, blood (Routine X 2) w Reflex to ID Panel     Status: None   Collection Time:  04/03/20  3:27 PM   Specimen: BLOOD  Result Value Ref Range Status   Specimen Description BLOOD BLOOD RIGHT ARM  Final   Special Requests   Final    BOTTLES DRAWN AEROBIC AND ANAEROBIC Blood Culture adequate volume   Culture   Final    NO GROWTH 5 DAYS Performed at [facility], [address] [place], [place] [address]    Report Status 04/08/2020 FINAL  Final  Urine Culture     Status: Abnormal   Collection Time: 04/03/20  4:00 PM   Specimen: Urine, Random  Result Value Ref Range Status   Specimen Description   Final    URINE, RANDOM Performed at [facility], [address], [place], [place] [address]    Special Requests   Final    NONE Performed at [facility], [address] [place], [place] [address]    Culture (A)  Final    >=100,000 COLONIES/mL ESCHERICHIA COLI >=100,000 COLONIES/mL AEROCOCCUS URINAE Standardized susceptibility testing for this organism is not available. Performed at [facility], [place] [address], [place], [place] [address]    Report Status 04/07/2020 FINAL  Final   Organism ID, Bacteria ESCHERICHIA COLI (A)  Final      Susceptibility   Escherichia coli - MIC*    AMPICILLIN 4 SENSITIVE Sensitive     CEFAZOLIN <=4 SENSITIVE Sensitive     CEFEPIME <=0.12 SENSITIVE Sensitive     CEFTRIAXONE <=0.25 SENSITIVE Sensitive     CIPROFLOXACIN <=0.25 SENSITIVE Sensitive     GENTAMICIN <=1 SENSITIVE Sensitive     IMIPENEM <=0.25 SENSITIVE Sensitive     NITROFURANTOIN 32 SENSITIVE Sensitive     TRIMETH/SULFA <=20 SENSITIVE Sensitive     AMPICILLIN/SULBACTAM <=2 SENSITIVE Sensitive     PIP/TAZO <=4 SENSITIVE Sensitive     * >=100,000 COLONIES/mL ESCHERICHIA COLI  Culture, blood (Routine X 2) w Reflex to ID Panel     Status: None   Collection Time: 04/03/20 10:36 PM   Specimen: BLOOD  Result Value Ref Range Status   Specimen Description BLOOD BLOOD LEFT HAND  Final   Special Requests   Final    BOTTLES DRAWN AEROBIC AND  ANAEROBIC Blood Culture adequate volume   Culture   Final    NO GROWTH 5 DAYS Performed at [facility], [address], [place], [place] [address]    Report Status 04/08/2020 FINAL  Final  Urine Culture     Status: None   Collection Time: 04/08/20  9:52 AM   Specimen: Urine, Random  Result Value Ref Range Status   Specimen Description   Final    URINE, RANDOM Performed at [facility], [address], [place], [place] [address]    Special Requests   Final    NONE Performed at [facility], [address] [place], [place] [address]    Culture   Final    NO GROWTH Performed at [facility], [place] [address], [place], [place] [address]    Report Status 04/09/2020 FINAL  Final  CULTURE, BLOOD (ROUTINE X 2) w Reflex to ID Panel     Status: None (Preliminary result)   Collection Time: 04/08/20 10:02 AM   Specimen: BLOOD  Result Value Ref Range Status   Specimen Description BLOOD BLOOD RIGHT HAND  Final   Special Requests   Final    BOTTLES DRAWN AEROBIC AND ANAEROBIC Blood Culture results may not be optimal due to an inadequate volume of blood received in culture bottles   Culture   Final    NO GROWTH 2 DAYS Performed at [facility], [address], [place], [place] [address]    Report Status PENDING  Incomplete  MRSA PCR Screening     Status: None   Collection Time: 04/08/20 10:04 AM   Specimen: Nasal Mucosa; Nasopharyngeal  Result Value Ref Range Status   MRSA by PCR NEGATIVE NEGATIVE Final    Comment:        The GeneXpert MRSA Assay (FDA approved for NASAL specimens only), is one component of a comprehensive MRSA colonization surveillance program. It is not intended to diagnose MRSA infection nor to guide or monitor treatment for MRSA infections. Performed at [facility], [address] [place], [place] [address]   CULTURE, BLOOD (ROUTINE X 2) w Reflex to ID Panel     Status: None (Preliminary result)    Collection Time: 04/08/20 10:26 AM   Specimen: BLOOD  Result Value Ref Range Status   Specimen Description BLOOD BLOOD RIGHT HAND  Final   Special Requests   Final    BOTTLES DRAWN AEROBIC AND ANAEROBIC Blood Culture results may not be optimal due to an inadequate volume of blood received in culture bottles   Culture   Final    NO GROWTH 2 DAYS Performed at [facility], [address] [place], [place] [address]    Report Status PENDING  Incomplete     Radiology Studies: DG Chest 1 View  Result Date: 04/09/2020 CLINICAL DATA:  Acute respiratory failure EXAM: CHEST  1 VIEW COMPARISON:  04/03/2016 FINDINGS: Normal cardiac silhouette. Interval increase in interstitial edema pattern. No focal consolidation. No pneumothorax. IMPRESSION: Worsening interstitial edema. Electronically Signed   By: Suzy Bouchard M.D.   On: 04/09/2020 06:31    Scheduled Meds: . amLODipine  10 mg Oral Daily  . vitamin C  1,000 mg Oral Daily  . azithromycin  500 mg Oral Daily  . Chlorhexidine Gluconate Cloth  6 each Topical Daily  . fluticasone  1 spray Each Nare BID  . insulin aspart  0-5 Units Subcutaneous QHS  . insulin aspart  0-9 Units Subcutaneous TID WC  . ipratropium-albuterol  3 mL Nebulization TID  . levothyroxine  150 mcg Oral Q0600  . loratadine  10 mg  Oral Daily  . losartan  50 mg Oral Daily  . methylPREDNISolone (SOLU-MEDROL) injection  20 mg Intravenous Q12H  . nicotine  21 mg Transdermal Daily  . potassium chloride  40 mEq Oral BID  . rosuvastatin  5 mg Oral Daily  . sodium chloride flush  3 mL Intravenous Q12H  . vitamin B-12  1,000 mcg Oral Daily   Continuous Infusions: . sodium chloride Stopped (04/10/20 1661)  . dextrose 50 mL/hr at 04/10/20 0941  . piperacillin-tazobactam (ZOSYN)  IV 12.5 mL/hr at 04/10/20 0833  . potassium chloride 10 mEq (04/10/20 0948)     LOS: 7 days   Time spent: 35 minutes  Desma Maxim, MD [facility]  If 7PM-7AM, please contact  night-coverage [email or web address]  04/10/2020, 9:49 AM

## 2020-04-10 NOTE — Progress Notes (Signed)
Pulmonary and Critical Care  Medicine          Date: 04/10/2020,   MRN# [id number] Martha [name] April 25, 1946     AdmissionWeight: 72 kg                 CurrentWeight: 66.9 kg  Referring physician: Dr. Si Raider    CHIEF COMPLAINT:   Acute on chronic hypoxemic respiratory failure   HISTORY OF PRESENT ILLNESS   74 year old female with history of diabetes COPD lifelong smoking hypothyroidism gout major depressive disorder, anxiety disorder and GERD, diverticulitis who came in with signs and symptoms of anasarca including peripheral edema and pulmonary edema.  Patient states is been worse than usual despite compliance with wrapping the lower extremities.  She also reported coughing and dyspnea.  Upon presentation to the emergency room she was found to be desaturating in the mid 80s and was placed on 4 L supplemental oxygen with some subsequent improvement of SPO2.  She was then treated with BiPAP and felt better.  She was initially admitted with acute exacerbation of CHF and cardiology was consulted for this.  She was also found to have proteinuria with CKD and had renal consultation placed. Pulmonary consultation placed for acute on chronic hypoxemic respiratory failure with failure to wean down oxygen.  Patient did have CT PE protocol done to rule out acute pulmonary venous embolism which was negative however did show bullous emphysema with overlying pulmonary edema as well as a right lower lobe consolidated infiltrate suggestive of pneumonia as well as right hemidiaphragm elevation due to hepatomegaly.  04/09/20- patient seen at bedside, remains encephalopathic.  Discussed with [facility] attending Dr Si Raider plan for transfer to SDU.     04/10/20- patient is improved.  She is more awake, she was able to speak with me despite BIPAP mask, she has normal 4/4 grip bilaterally and moves LE to verbal communication without encouragment. Mentation has significantly improved. She is flushed red on  examination.     PAST MEDICAL HISTORY   Past Medical History:  Diagnosis Date  . Abnormal LFTs (liver function tests)   . Anxiety   . Arthritis    knees, feet  . Asthma   . Chronic bronchitis ([place])   . Chronic constipation   . Chronic low back pain   . Cigarette smoker    Has cut back Smoking to 1 pack every other day  . COPD (chronic obstructive pulmonary disease) ([place])   . Depression   . Diabetic neuropathy ([place])   . Diverticulitis 2015   gi recommended repeat scope in 10 years  . Essential hypertension   . Fatty liver   . GERD (gastroesophageal reflux disease)   . Gout   . History of mammogram 05/28/2013  . Hyperlipidemia   . Hypothyroidism   . Insomnia   . Iron deficiency anemia   . Low serum vitamin D   . Personal history of tobacco use, presenting hazards to health 09/29/2015  . Plantar fasciitis   . RLS (restless legs syndrome)   . Shortness of breath dyspnea   . Thyroid cancer ([place]) 2009  . Tremors of nervous system    "I think I have parkinson's disease"  . Type 2 diabetes mellitus ([place])   . Wears dentures    uppers     SURGICAL HISTORY   Past Surgical History:  Procedure Laterality Date  . CATARACT EXTRACTION W/PHACO Left 11/24/2015   Procedure: CATARACT EXTRACTION PHACO AND INTRAOCULAR LENS PLACEMENT (IOC);  Surgeon: Birder Robson, MD;  Location: [facility] ORS;  Service: Ophthalmology;  Laterality: Left;  [place] 38.3 AP% 20.6 CDE 7.89 FLUID PACK LOT # [id number]  . CATARACT EXTRACTION W/PHACO Right 06/23/2019   Procedure: CATARACT EXTRACTION PHACO AND INTRAOCULAR LENS PLACEMENT ([place]) RIGHT DIABETIC;  Surgeon: Birder Robson, MD;  Location: [place];  Service: Ophthalmology;  Laterality: Right;  Diabetic  . COLONOSCOPY  2004, 2009, 2015   [place]  . POLYPECTOMY  2009  . THYROIDECTOMY  2009   states she was tx with radiactive iodine  . TUBAL LIGATION    . UPPER GASTROINTESTINAL ENDOSCOPY       FAMILY HISTORY   Family History  Problem  Relation Age of Onset  . Diabetes Mellitus II Father   . Lung disease Father   . Diabetes Mellitus II Mother   . Arthritis Mother   . Thyroid disease Other   . Asthma Sister   . Emphysema Sister   . Hypertension Sister   . Breast cancer Paternal Aunt      SOCIAL HISTORY   Social History   Tobacco Use  . Smoking status: Current Every Day Smoker    Packs/day: 1.00    Years: 39.50    Pack years: 39.50    Types: Cigarettes  . Smokeless tobacco: Never Used  . Tobacco comment: curently smokes 12 cigerattes a day. I'm trying to cut back.  Vaping Use  . Vaping Use: Never used  Substance Use Topics  . Alcohol use: No    Alcohol/week: 0.0 standard drinks  . Drug use: No     MEDICATIONS    Home Medication:    Current Medication:  Current Facility-Administered Medications:  .  0.9 %  sodium chloride infusion, 250 mL, Intravenous, PRN, Ivor Costa, MD, Stopped at 04/10/20 [phone] .  acetaminophen (TYLENOL) suppository 650 mg, 650 mg, Rectal, Q6H PRN, Lang Snow, NP, 650 mg at 04/08/20 2234 .  acetaminophen (TYLENOL) tablet 650 mg, 650 mg, Oral, Q6H PRN, Lorella Nimrod, MD, 650 mg at 04/06/20 1638 .  albuterol (PROVENTIL) (2.5 MG/3ML) 0.083% nebulizer solution 2.5 mg, 2.5 mg, Nebulization, Q4H PRN, Ivor Costa, MD .  amLODipine (NORVASC) tablet 10 mg, 10 mg, Oral, Daily, Wouk, Ailene Rud, MD, 10 mg at 04/08/20 9702 .  ascorbic acid (VITAMIN C) tablet 1,000 mg, 1,000 mg, Oral, Daily, Ivor Costa, MD, 1,000 mg at 04/07/20 0824 .  azithromycin (ZITHROMAX) tablet 500 mg, 500 mg, Oral, Daily, Wouk, Ailene Rud, MD .  Chlorhexidine Gluconate Cloth 2 % PADS 6 each, 6 each, Topical, Daily, Wouk, Ailene Rud, MD, 6 each at 04/10/20 1047 .  dextromethorphan-guaiFENesin (MUCINEX DM) 30-600 MG per 12 hr tablet 1 tablet, 1 tablet, Oral, BID PRN, Ivor Costa, MD, 1 tablet at 04/06/20 0945 .  dextrose 5 % solution, , Intravenous, Continuous, Kolluru, Sarath, MD, Last Rate: 50 mL/hr at  04/10/20 0941, New Bag at 04/10/20 0941 .  fentaNYL (SUBLIMAZE) injection 12.5 mcg, 12.5 mcg, Intravenous, Q2H PRN, Sharion Settler, NP, 12.5 mcg at 04/09/20 1556 .  fluticasone (FLONASE) 50 MCG/ACT nasal spray 1 spray, 1 spray, Each Nare, BID, Sharion Settler, NP, 1 spray at 04/07/20 2122 .  fluticasone (FLONASE) 50 MCG/ACT nasal spray 1-2 spray, 1-2 spray, Each Nare, Daily PRN, Ivor Costa, MD .  hydrALAZINE (APRESOLINE) injection 10 mg, 10 mg, Intravenous, Q2H PRN, Wouk, Ailene Rud, MD, 10 mg at 04/10/20 1120 .  insulin aspart (novoLOG) injection 0-5 Units, 0-5 Units, Subcutaneous, QHS, Ivor Costa, MD, 2 Units at  04/09/20 2240 .  insulin aspart (novoLOG) injection 0-9 Units, 0-9 Units, Subcutaneous, TID WC, Ivor Costa, MD, 3 Units at 04/10/20 [phone] .  ipratropium-albuterol (DUONEB) 0.5-2.5 (3) MG/3ML nebulizer solution 3 mL, 3 mL, Nebulization, TID, Ivor Costa, MD, 3 mL at 04/10/20 0811 .  levothyroxine (SYNTHROID) tablet 150 mcg, 150 mcg, Oral, Q0600, Ivor Costa, MD, 150 mcg at 04/08/20 0616 .  loratadine (CLARITIN) tablet 10 mg, 10 mg, Oral, Daily, Ivor Costa, MD, 10 mg at 04/07/20 0824 .  losartan (COZAAR) tablet 50 mg, 50 mg, Oral, Daily, Ivor Costa, MD, 50 mg at 04/06/20 0949 .  methylPREDNISolone sodium succinate (SOLU-MEDROL) 40 mg/mL injection 20 mg, 20 mg, Intravenous, Q12H, Lanney Gins, Sapphira Harjo, MD, 20 mg at 04/10/20 0159 .  metoprolol tartrate (LOPRESSOR) injection 5 mg, 5 mg, Intravenous, Q6H PRN, Lang Snow, NP, 5 mg at 04/09/20 1616 .  nicotine (NICODERM CQ - dosed in mg/24 hours) patch 21 mg, 21 mg, Transdermal, Daily, Lateef, Munsoor, MD, 21 mg at 04/10/20 1036 .  nitroGLYCERIN (NITROSTAT) SL tablet 0.4 mg, 0.4 mg, Sublingual, Q5 min PRN, Wouk, Ailene Rud, MD, 0.4 mg at 04/08/20 0659 .  ondansetron (ZOFRAN) injection 4 mg, 4 mg, Intravenous, Q8H PRN, Ivor Costa, MD, 4 mg at 04/06/20 1543 .  piperacillin-tazobactam (ZOSYN) IVPB 3.375 g, 3.375 g, Intravenous, Q8H, Wouk,  Ailene Rud, MD, Last Rate: 12.5 mL/hr at 04/10/20 8546, Infusion Verify at 04/10/20 0833 .  potassium chloride 10 mEq in 100 mL IVPB, 10 mEq, Intravenous, Q1 Hr x 6, Wouk, Ailene Rud, MD, Last Rate: 100 mL/hr at 04/10/20 1047, 10 mEq at 04/10/20 1047 .  potassium chloride SA (KLOR-CON) CR tablet 40 mEq, 40 mEq, Oral, BID, Wouk, Ailene Rud, MD, 40 mEq at 04/08/20 2703 .  rosuvastatin (CRESTOR) tablet 5 mg, 5 mg, Oral, Daily, Ivor Costa, MD, 5 mg at 04/07/20 0825 .  sodium chloride flush (NS) 0.9 % injection 3 mL, 3 mL, Intravenous, Q12H, Ivor Costa, MD, 3 mL at 04/10/20 1031 .  sodium chloride flush (NS) 0.9 % injection 3 mL, 3 mL, Intravenous, PRN, Ivor Costa, MD .  vitamin B-12 (CYANOCOBALAMIN) tablet 1,000 mcg, 1,000 mcg, Oral, Daily, Ivor Costa, MD, 1,000 mcg at 04/07/20 0825    ALLERGIES   Nicotine polacrilex, Nicotrol [nicotine], and Codeine sulfate     REVIEW OF SYSTEMS    Review of Systems:  Unable to obtain due to encephalopathy   VS: BP (!) 169/82   Pulse (!) 124   Temp (!) 97.5 F (36.4 C) (Axillary) Comment: blankets applied  Resp 20   Ht _0  (1.626 m)   Wt 66.9 kg   SpO2 93%   BMI 25.32 kg/m      PHYSICAL EXAM    GENERAL:NAD, no fevers, chills, no weakness no fatigue HEAD: Normocephalic, atraumatic.  EYES: Pupils equal, round, reactive to light. Extraocular muscles intact. No scleral icterus.  MOUTH: dry mucosal membrane. Dentition intact. No abscess noted. On BIPAP EAR, NOSE, THROAT: Clear without exudates. No external lesions.  NECK: Supple. No thyromegaly. No nodules. No JVD.  PULMONARY: mild rhonchi bilaterally  CARDIOVASCULAR: S1 and S2. Regular rate and rhythm. No murmurs, rubs, or gallops. No edema. Pedal pulses 2+ bilaterally.  GASTROINTESTINAL: Soft, nontender, nondistended. No masses. Positive bowel sounds. No hepatosplenomegaly.  MUSCULOSKELETAL: No swelling, clubbing, +edema 2++.  NEUROLOGIC: GCS 10  SKIN: No ulceration, lesions,  rashes, or cyanosis. Skin warm and dry. Turgor intact.       IMAGING    DG Chest  1 View  Result Date: 04/09/2020 CLINICAL DATA:  Acute respiratory failure EXAM: CHEST  1 VIEW COMPARISON:  04/03/2016 FINDINGS: Normal cardiac silhouette. Interval increase in interstitial edema pattern. No focal consolidation. No pneumothorax. IMPRESSION: Worsening interstitial edema. Electronically Signed   By: Suzy Bouchard M.D.   On: 04/09/2020 06:31   DG Chest 2 View  Result Date: 04/03/2020 CLINICAL DATA:  Hypoxia. Leg edema and shortness of breath. Pt daughter states that pt is supposed to be getting a renal bx but they are not able to do it because of the edema in pts legs. Pt states that she does feel short of breath. Pt does not wear oxygen at home. Pt placed on O2 in triage due to sats being in the 80s. Hx of asthma, chronic bronchitis, COPD, thyroid cancer- 2009, DM, current smoker. EXAM: CHEST - 2 VIEW COMPARISON:  01/04/2018 FINDINGS: Cardiac silhouette is borderline enlarged. Stable aortic atherosclerotic calcifications. No mediastinal or hilar masses. No evidence of adenopathy. Lungs demonstrate diffuse bilateral irregular interstitial thickening which has increased compared to the prior study. There is also linear/reticular scarring in the left lung base, which is stable. No lung consolidation. No pleural effusion and no pneumothorax. Skeletal structures are demineralized, but grossly intact. IMPRESSION: 1. Bilateral irregular interstitial thickening, increased compared to the prior study, along with borderline cardiomegaly. Suspect mild congestive heart failure. Consider diffuse interstitial infection or inflammation in the proper clinical setting. No evidence of lobar pneumonia. Electronically Signed   By: Lajean Manes M.D.   On: 04/03/2020 13:06   CT CHEST W CONTRAST  Result Date: 04/06/2020 CLINICAL DATA:  Hemoptysis. EXAM: CT CHEST WITH CONTRAST TECHNIQUE: Multidetector CT imaging of the  chest was performed during intravenous contrast administration. CONTRAST:  73m OMNIPAQUE IOHEXOL 300 MG/ML  SOLN COMPARISON:  January 06, 2019 FINDINGS: Cardiovascular: There is marked severity calcification of the aortic arch. There is mild cardiomegaly. No pericardial effusion. Mediastinum/Nodes: There is mild pretracheal and right hilar lymphadenopathy. Thyroid gland, trachea, and esophagus demonstrate no significant findings. Lungs/Pleura: Mild-to-moderate severity emphysematous lung disease is seen involving the bilateral upper lobes with stable bullous disease is seen along the medial aspect of the left apex. Mild to moderate severity areas of atelectasis and/or infiltrate are seen within the posterior aspects of the bilateral upper lobes and left lung base. Moderate to marked severity posterior right basilar atelectasis and/or infiltrate is noted. A very small right pleural effusion is seen. No pneumothorax is identified. Upper Abdomen: There is stable hepatosplenomegaly with stable, moderate severity diffuse bilateral adrenal gland enlargement. Musculoskeletal: A chronic lateral eighth left rib fracture is seen. A chronic compression fracture deformity of the T12 vertebral body is noted. IMPRESSION: 1. Mild-to-moderate severity posterior bilateral upper lobe and left basilar atelectasis and/or infiltrate. 2. Moderate to marked severity posterior right basilar atelectasis and/or infiltrate. 3. Very small right pleural effusion. 4. Mild cardiomegaly. 5. Stable emphysematous lung disease. 6. Chronic lateral eighth left rib fracture. 7. Chronic compression fracture deformity of the T12 vertebral body. 8. Aortic atherosclerosis. Aortic Atherosclerosis (ICD10-I70.0) and Emphysema (ICD10-J43.9). Electronically Signed   By: TVirgina NorfolkM.D.   On: 04/06/2020 15:33   CT ANGIO CHEST PE W OR WO CONTRAST  Result Date: 04/07/2020 CLINICAL DATA:  Shortness of breath EXAM: CT ANGIOGRAPHY CHEST WITH CONTRAST  TECHNIQUE: Multidetector CT imaging of the chest was performed using the standard protocol during bolus administration of intravenous contrast. Multiplanar CT image reconstructions and MIPs were obtained to evaluate the vascular anatomy. CONTRAST:  760m  OMNIPAQUE IOHEXOL 350 MG/ML SOLN COMPARISON:  April 06, 2020 FINDINGS: Cardiovascular: There is a optimal opacification of the pulmonary arteries. There is no central,segmental, or subsegmental filling defects within the pulmonary arteries. There is unchanged cardiomegaly. Mitral valve calcifications and coronary artery calcifications are seen. No pericardial effusion or thickening. No evidence right heart strain. There is normal three-vessel brachiocephalic anatomy without proximal stenosis. Scattered aortic atherosclerosis is noted. Mediastinum/Nodes: No hilar, mediastinal, or axillary adenopathy. Thyroid gland, trachea, and esophagus demonstrate no significant findings. Lungs/Pleura: Patchy airspace opacity seen at the left upper lung and posterior right lung base with air bronchograms. There are trace bilateral pleural effusions present. Centrilobular and paraseptal emphysematous changes seen at both lung apices. Upper Abdomen: No acute abnormalities present in the visualized portions of the upper abdomen. Musculoskeletal: No chest wall abnormality. No acute or significant osseous findings. There is a chronic anterior wedge compression deformity of the T12 vertebral body with 50% loss in vertebral body height. Review of the MIP images confirms the above findings. IMPRESSION: No central, segmental, or subsegmental pulmonary embolism. Unchanged multifocal patchy airspace opacities within both lungs which could be due to multifocal pneumonia Trace bilateral pleural effusions, right greater than left. Aortic Atherosclerosis (ICD10-I70.0). Electronically Signed   By: Prudencio Pair M.D.   On: 04/07/2020 19:43   US Venous Img Lower Bilateral (DVT)  Result Date:  04/03/2020 CLINICAL DATA:  Lower leg cellulitis EXAM: BILATERAL LOWER EXTREMITY VENOUS DOPPLER ULTRASOUND TECHNIQUE: Gray-scale sonography with graded compression, as well as color Doppler and duplex ultrasound were performed to evaluate the lower extremity deep venous systems from the level of the common femoral vein and including the common femoral, femoral, profunda femoral, popliteal and calf veins including the posterior tibial, peroneal and gastrocnemius veins when visible. The superficial great saphenous vein was also interrogated. Spectral Doppler was utilized to evaluate flow at rest and with distal augmentation maneuvers in the common femoral, femoral and popliteal veins. COMPARISON:  None. FINDINGS: RIGHT LOWER EXTREMITY Common Femoral Vein: No evidence of thrombus. Normal compressibility, respiratory phasicity and response to augmentation. Saphenofemoral Junction: No evidence of thrombus. Normal compressibility and flow on color Doppler imaging. Profunda Femoral Vein: No evidence of thrombus. Normal compressibility and flow on color Doppler imaging. Femoral Vein: No evidence of thrombus. Normal compressibility, respiratory phasicity and response to augmentation. Popliteal Vein: Not well visualized due to overlying edema. Calf Veins: Not well visualized due to overlying edema. Superficial Great Saphenous Vein: No evidence of thrombus. Normal compressibility. Venous Reflux:  None. Other Findings:  None. LEFT LOWER EXTREMITY Common Femoral Vein: No evidence of thrombus. Normal compressibility, respiratory phasicity and response to augmentation. Saphenofemoral Junction: No evidence of thrombus. Normal compressibility and flow on color Doppler imaging. Profunda Femoral Vein: No evidence of thrombus. Normal compressibility and flow on color Doppler imaging. Femoral Vein: No evidence of thrombus. Normal compressibility, respiratory phasicity and response to augmentation. Popliteal Vein: Not well visualized due  to overlying edema. Calf Veins: Not well visualized due to overlying edema. Superficial Great Saphenous Vein: No evidence of thrombus. Normal compressibility. Venous Reflux:  None. Other Findings:  None. IMPRESSION: Somewhat limited exam due to peripheral edema although no central deep venous thrombosis is noted. Electronically Signed   By: Inez Catalina M.D.   On: 04/03/2020 15:58   ECHOCARDIOGRAM COMPLETE  Result Date: 04/04/2020    ECHOCARDIOGRAM REPORT   Patient Name:   CHERISSE CARRELL Date of Exam: 04/04/2020 Medical Rec #:  [id number]      Height:  64.0 in Accession #:    [id number]     Weight:       181.8 lb Date of Birth:  02/16/1946     BSA:          1.879 m Patient Age:    65 years       BP:           171/78 mmHg Patient Gender: F              HR:           98 bpm. Exam Location:  [facility] Procedure: 2D Echo, Color Doppler, Cardiac Doppler and Intracardiac            Opacification Agent Indications:     I50.31 CHF-Acute Diastolic  History:         Patient has no prior history of Echocardiogram examinations.                  COPD, Signs/Symptoms:Shortness of Breath; Risk Factors:Diabetes                  and Current Smoker.  Sonographer:     Charmayne Sheer RDCS (AE) Referring Phys:  Baker Janus Soledad Gerlach NIU Diagnosing Phys: Bartholome Bill MD  Sonographer Comments: Technically difficult study due to poor echo windows. Image acquisition challenging due to patient body habitus and Image acquisition challenging due to COPD. IMPRESSIONS  1. Left ventricular ejection fraction, by estimation, is 55 to 60%. The left ventricle has normal function. The left ventricle has no regional wall motion abnormalities. Left ventricular diastolic parameters are consistent with Grade I diastolic dysfunction (impaired relaxation).  2. Right ventricular systolic function is normal. The right ventricular size is mildly enlarged.  3. Left atrial size was mildly dilated.  4. Right atrial size was mildly dilated.  5. The mitral valve was not well  visualized. Trivial mitral valve regurgitation.  6. The aortic valve was not well visualized. Aortic valve regurgitation is trivial. FINDINGS  Left Ventricle: Left ventricular ejection fraction, by estimation, is 55 to 60%. The left ventricle has normal function. The left ventricle has no regional wall motion abnormalities. Definity contrast agent was given IV to delineate the left ventricular  endocardial borders. The left ventricular internal cavity size was normal in size. There is borderline left ventricular hypertrophy. Left ventricular diastolic parameters are consistent with Grade I diastolic dysfunction (impaired relaxation). Right Ventricle: The right ventricular size is mildly enlarged. No increase in right ventricular wall thickness. Right ventricular systolic function is normal. Left Atrium: Left atrial size was mildly dilated. Right Atrium: Right atrial size was mildly dilated. Pericardium: There is no evidence of pericardial effusion. Mitral Valve: The mitral valve was not well visualized. Trivial mitral valve regurgitation. MV peak gradient, 7.4 mmHg. The mean mitral valve gradient is 3.0 mmHg. Tricuspid Valve: The tricuspid valve is not well visualized. Tricuspid valve regurgitation is trivial. Aortic Valve: The aortic valve was not well visualized. Aortic valve regurgitation is trivial. Aortic valve mean gradient measures 3.0 mmHg. Aortic valve peak gradient measures 6.8 mmHg. Aortic valve area, by VTI measures 2.46 cm. Pulmonic Valve: The pulmonic valve was not well visualized. Pulmonic valve regurgitation is not visualized. Aorta: The aortic root was not well visualized. IAS/Shunts: The interatrial septum was not assessed.  LEFT VENTRICLE PLAX 2D LVIDd:         4.15 cm     Diastology LVIDs:         3.64 cm     LV  e' medial:    5.98 cm/s LV PW:         1.43 cm     LV E/e' medial:  18.3 LV IVS:        1.12 cm     LV e' lateral:   6.53 cm/s LVOT diam:     2.00 cm     LV E/e' lateral: 16.8 LV SV:          55 LV SV Index:   29 LVOT Area:     3.14 cm  LV Volumes (MOD) LV vol d, MOD A2C: 55.5 ml LV vol d, MOD A4C: 63.8 ml LV vol s, MOD A2C: 34.3 ml LV vol s, MOD A4C: 23.4 ml LV SV MOD A2C:     21.2 ml LV SV MOD A4C:     63.8 ml LV SV MOD BP:      34.2 ml RIGHT VENTRICLE RV Basal diam:  3.76 cm LEFT ATRIUM             Index       RIGHT ATRIUM           Index LA diam:        4.90 cm 2.61 cm/m  RA Area:     19.80 cm LA Vol (A2C):   59.5 ml 31.67 ml/m RA Volume:   61.10 ml  32.52 ml/m LA Vol (A4C):   57.5 ml 30.61 ml/m LA Biplane Vol: 61.2 ml 32.58 ml/m  AORTIC VALVE                   PULMONIC VALVE AV Area (Vmax):    2.51 cm    PV Vmax:       0.76 m/s AV Area (Vmean):   2.43 cm    PV Vmean:      53.200 cm/s AV Area (VTI):     2.46 cm    PV VTI:        0.126 m AV Vmax:           130.00 cm/s PV Peak grad:  2.3 mmHg AV Vmean:          83.500 cm/s PV Mean grad:  1.0 mmHg AV VTI:            0.225 m AV Peak Grad:      6.8 mmHg AV Mean Grad:      3.0 mmHg LVOT Vmax:         104.00 cm/s LVOT Vmean:        64.700 cm/s LVOT VTI:          0.176 m LVOT/AV VTI ratio: 0.78  AORTA Ao Root diam: 3.10 cm MITRAL VALVE                TRICUSPID VALVE MV Area (PHT): 3.00 cm     TR Peak grad:   28.9 mmHg MV Peak grad:  7.4 mmHg     TR Vmax:        269.00 cm/s MV Mean grad:  3.0 mmHg MV Vmax:       1.36 m/s     SHUNTS MV Vmean:      86.9 cm/s    Systemic VTI:  0.18 m MV Decel Time: 253 msec     Systemic Diam: 2.00 cm MV E velocity: 109.50 cm/s MV A velocity: 137.50 cm/s MV E/A ratio:  0.80 Bartholome Bill MD Electronically signed by Bartholome Bill MD Signature Date/Time: 04/04/2020/4:54:02 PM    Final  ASSESSMENT/PLAN   Acute on chronic hypoxemic respiratory failure -Due to acute exacerbation of CHF as well as right lower lobe pneumonia with pulmonary edema -BNP is severely elevated over 2500  -Patient presented with mild leukocytosis with left shift suggestive of possible infectious etiology -Nephrology on case  appreciate input patient is currently on Lasix drip-04/09/20- reviewed care plan with nephrology team Dr Juleen China -dcd lasix and started IVF -Agree with vancomycin and Zosyn-we will obtain a procalcitonin trend to guide de-escalation -Would recommend to DC vancomycin since MRSA PCR is negative -Solu-Medrol 20 mg twice daily due to COPD with exacerbation -ABG-metablic alk with resp compensation acute -due to most likely volume contracted state -Procalcitonin trend -transfer to step down   Altered mental status with confusion-imporoved  -Patient on gabapentin, fentanyl, Ativan, Zanaflex, Zoloft, Requip, oxycodone -Daughter is present during my examination and reports that patient does not take any of these medications on a regular basis at home, specifically she states that Zoloft is taken as needed when patient feels sad, and gabapentin is only taken seldomly.  Additionally she mentioned that when patient does take gabapentin she generally goes into deep sleep from 1 pill. -Meds have been adjusted and mentation is improved -will obtain ABG to evaluate hypercanic encephalopathy -continue on BIPAP       Thank you for allowing me to participate in the care of this patient.    Patient/Family are satisfied with care plan and all questions have been answered.   This document was prepared using Dragon voice recognition software and may include unintentional dictation errors.     Ottie Glazier, M.D.  Division of [place]

## 2020-04-10 NOTE — Progress Notes (Signed)
[facility] ([place]) Cardiology  SUBJECTIVE: Patient laying in bed, on BiPAP, minimally responsive to questions   Vitals:   04/10/20 0300 04/10/20 0400 04/10/20 0500 04/10/20 0600  BP: (!) 174/70 (!) 182/86 (!) 177/69 (!) 168/82  Pulse: (!) 102 70 (!) 46 75  Resp: 16 16 18 16   Temp:      TempSrc:      SpO2: 93% 92% 92% 92%  Weight:      Height:         Intake/Output Summary (Last 24 hours) at 04/10/2020 8119 Last data filed at 04/09/2020 1900 Gross per 24 hour  Intake 688.03 ml  Output 400 ml  Net 288.03 ml      PHYSICAL EXAM  General: Well developed, well nourished, in no acute distress HEENT:  Normocephalic and atramatic Neck:  No JVD.  Lungs: Clear bilaterally to auscultation and percussion. Heart: HRRR . Normal S1 and S2 without gallops or murmurs.  Abdomen: Bowel sounds are positive, abdomen soft and non-tender  Msk:  Back normal, normal gait. Normal strength and tone for age. Extremities: No clubbing, cyanosis or edema.   Neuro: Alert and oriented X 3. Psych:  Good affect, responds appropriately   LABS: Basic Metabolic Panel: Recent Labs    04/09/20 0438 04/10/20 0517  NA 143 149*  K 2.9* 2.4*  CL 87* 93*  CO2 39* 40*  GLUCOSE 195* 195*  BUN 26* 39*  CREATININE 0.58 0.56  CALCIUM 9.7 9.7  MG 2.3 2.4   Liver Function Tests: Recent Labs    04/09/20 0438 04/10/20 0517  AST 16 15  ALT 18 15  ALKPHOS 81 82  BILITOT 1.9* 1.8*  PROT 7.8 7.5  ALBUMIN 5.3* 4.9   Recent Labs    04/09/20 0438  LIPASE 27   CBC: Recent Labs    04/10/20 0517  WBC 11.0*  HGB 15.9*  HCT 51.4*  MCV 93.3  PLT 173   Cardiac Enzymes: No results for input(s): CKTOTAL, CKMB, CKMBINDEX, TROPONINI in the last 72 hours. BNP: Invalid input(s): POCBNP D-Dimer: No results for input(s): DDIMER in the last 72 hours. Hemoglobin A1C: No results for input(s): HGBA1C in the last 72 hours. Fasting Lipid Panel: No results for input(s): CHOL, HDL, LDLCALC, TRIG, CHOLHDL, LDLDIRECT in the  last 72 hours. Thyroid Function Tests: No results for input(s): TSH, T4TOTAL, T3FREE, THYROIDAB in the last 72 hours.  Invalid input(s): FREET3 Anemia Panel: No results for input(s): VITAMINB12, FOLATE, FERRITIN, TIBC, IRON, RETICCTPCT in the last 72 hours.  DG Chest 1 View  Result Date: 04/09/2020 CLINICAL DATA:  Acute respiratory failure EXAM: CHEST  1 VIEW COMPARISON:  04/03/2016 FINDINGS: Normal cardiac silhouette. Interval increase in interstitial edema pattern. No focal consolidation. No pneumothorax. IMPRESSION: Worsening interstitial edema. Electronically Signed   By: Suzy Bouchard M.D.   On: 04/09/2020 06:31     Echo LVEF 55 to 60%  TELEMETRY: Sinus rhythm:  ASSESSMENT AND PLAN:  Principal Problem:   Acute CHF (congestive heart failure) (HCC) Active Problems:   COPD exacerbation (HCC)   Diabetes mellitus without complication ([place])   Current tobacco use   Hyperlipidemia   Essential hypertension   Hypothyroidism   Gout   Depression   Tremors of nervous system   Acute respiratory failure with hypoxia (HCC)   Cellulitis of lower extremity   Acute pulmonary edema (HCC)   Elevated brain natriuretic peptide (BNP) level   Nephrotic syndrome    1.  Acute on chronic diastolic congestive heart failure,  LV  EF 55 to 60% by 2D echocardiogram, received furosemide 40 mg IV yesterday 2.   Acute on chronic hypoxic respiratory failure, multifactorial, secondary to CHF, and right lower lobe pneumonia, followed by pulmonary 3.  Metabolic encephalopathy 4.  Nephrotic syndrome, followed by nephrology 5.  Essential hypertension, on amlodipine and losartan, blood pressure remains mildly elevated 6.  Hypokalemia  Recommendations  1.  Agree with overall current therapy 2.  Continue diuresis as needed 3.  Carefully monitor renal status 4.  Defer further cardiac diagnostics at this time 5.  Potassium repletion   Isaias Cowman, MD, PhD, [facility] 04/10/2020 8:29 AM

## 2020-04-10 NOTE — Progress Notes (Signed)
Per lab, K+ is critically low at 2.4, notified Rufina Falco NP. Awaiting orders.

## 2020-04-10 NOTE — Plan of Care (Signed)
  Problem: Education: Goal: Knowledge of General Education information will improve Description: Including pain rating scale, medication(s)/side effects and non-pharmacologic comfort measures Outcome: Not Progressing   Problem: Health Behavior/Discharge Planning: Goal: Ability to manage health-related needs will improve Outcome: Not Progressing   Problem: Clinical Measurements: Goal: Ability to maintain clinical measurements within normal limits will improve Outcome: Not Progressing Goal: Will remain free from infection Outcome: Not Progressing Goal: Diagnostic test results will improve Outcome: Not Progressing Goal: Respiratory complications will improve Outcome: Not Progressing Goal: Cardiovascular complication will be avoided Outcome: Not Progressing   Problem: Activity: Goal: Risk for activity intolerance will decrease Outcome: Not Progressing   Problem: Nutrition: Goal: Adequate nutrition will be maintained Outcome: Not Progressing   Problem: Coping: Goal: Level of anxiety will decrease Outcome: Not Progressing   Problem: Elimination: Goal: Will not experience complications related to bowel motility Outcome: Not Progressing Goal: Will not experience complications related to urinary retention Outcome: Not Progressing   Problem: Pain Managment: Goal: General experience of comfort will improve Outcome: Not Progressing   Problem: Safety: Goal: Ability to remain free from injury will improve Outcome: Not Progressing   Problem: Skin Integrity: Goal: Risk for impaired skin integrity will decrease Outcome: Not Progressing   Problem: Education: Goal: Ability to demonstrate management of disease process will improve Outcome: Not Progressing Goal: Ability to verbalize understanding of medication therapies will improve Outcome: Not Progressing Goal: Individualized Educational Video(s) Outcome: Not Progressing   Problem: Activity: Goal: Capacity to carry out  activities will improve Outcome: Not Progressing   Problem: Cardiac: Goal: Ability to achieve and maintain adequate cardiopulmonary perfusion will improve Outcome: Not Progressing   

## 2020-04-10 NOTE — Progress Notes (Signed)
Pt oriented to self and place this evening. Dry mouth, oral care performed. Pt had low temp despite blankets applied and bear hugger applied with effect. Family updated by MD on POC at bedside. Potassium runs administered per orders. VSS but prn antihypertensives administered for high blood pressures. Will continue to monitor.

## 2020-04-10 NOTE — Consult Note (Signed)
Pharmacy Antibiotic Note  Martha [name] is a 74 y.o. female admitted on 04/03/2020 with pneumonia.  Pharmacy has been consulted for pip/tazo dosing. Pt received ceftriaxone and keflex for a total of 5 days for cellulitis and UTI. Pt is on HFNC 10 L/min, afeb, procal < 0.1, WBC 8.6, Chest CT negative for PE and showed unchanged multifocal patchy airspace opacities within both lungs which could be due to multifocal pneumonia.   Plan: Pip/tazo 3.375 g q8H EI     Height: 5\' 4"  (162.6 cm) Weight: 66.9 kg (147 lb 7.8 oz) IBW/kg (Calculated) : 54.7  Temp (24hrs), Avg:97.8 F (36.6 C), Min:97 F (36.1 C), Max:98.9 F (37.2 C)  Recent Labs  Lab 04/04/20 0608 04/05/20 0546 04/07/20 0416 04/08/20 0415 04/09/20 0438 04/10/20 0517 04/10/20 1444  WBC 13.8*  --  8.6  --   --  11.0*  --   CREATININE 0.70   < > 0.65 0.61 0.58 0.56 0.61   < > = values in this interval not displayed.    Estimated Creatinine Clearance: 57.2 mL/min (by C-G formula based on SCr of 0.61 mg/dL).    Allergies  Allergen Reactions  . Nicotine Polacrilex Cough    Onset 04/29/2006.  Marland Kitchen Nicotrol [Nicotine] Cough  . Codeine Sulfate Itching    Onset 11/24/1998. tingling    Antimicrobials this admission: Ceftriaxone + keflex  12/5 >> 12/9 12/10 pip/tazo >>  12/10 vancomycin >>   Dose adjustments this admission: None  Microbiology results: 12/5 BCx: NGTD 12/5 UCx:  >=100,000 COLONIES/mL ESCHERICHIA COLI  >=100,000 COLONIES/mL AEROCOCCUS URINAE  12/5 MRSA PCR: ordered.   Thank you for allowing pharmacy to be a part of this patient's care.  Saphira Lahmann A 04/10/2020 5:20 PM

## 2020-04-10 NOTE — Progress Notes (Signed)
Notified NP Ouma of critical abg results. Patient remains on bipap.

## 2020-04-11 ENCOUNTER — Inpatient Hospital Stay: Payer: Medicare Other

## 2020-04-11 LAB — COMPREHENSIVE METABOLIC PANEL
ALT: 16 U/L (ref 0–44)
AST: 19 U/L (ref 15–41)
Albumin: 4.4 g/dL (ref 3.5–5.0)
Alkaline Phosphatase: 74 U/L (ref 38–126)
Anion gap: 14 (ref 5–15)
BUN: 40 mg/dL — ABNORMAL HIGH (ref 8–23)
CO2: 39 mmol/L — ABNORMAL HIGH (ref 22–32)
Calcium: 9.2 mg/dL (ref 8.9–10.3)
Chloride: 94 mmol/L — ABNORMAL LOW (ref 98–111)
Creatinine, Ser: 0.62 mg/dL (ref 0.44–1.00)
GFR, Estimated: >60 mL/min (ref >60)
Glucose, Bld: 220 mg/dL — ABNORMAL HIGH (ref 70–99)
Potassium: 3.3 mmol/L — ABNORMAL LOW (ref 3.5–5.1)
Sodium: 147 mmol/L — ABNORMAL HIGH (ref 135–145)
Total Bilirubin: 2.1 mg/dL — ABNORMAL HIGH (ref 0.3–1.2)
Total Protein: 7 g/dL (ref 6.5–8.1)

## 2020-04-11 LAB — GLUCOSE, CAPILLARY
Glucose-Capillary: 286 mg/dL — ABNORMAL HIGH (ref 70–99)
Glucose-Capillary: 235 mg/dL — ABNORMAL HIGH (ref 70–99)
Glucose-Capillary: 268 mg/dL — ABNORMAL HIGH (ref 70–99)
Glucose-Capillary: 288 mg/dL — ABNORMAL HIGH (ref 70–99)
Glucose-Capillary: 254 mg/dL — ABNORMAL HIGH (ref 70–99)

## 2020-04-11 LAB — BLOOD GAS, ARTERIAL
Acid-Base Excess: 25.3 mmol/L — ABNORMAL HIGH (ref 0.0–2.0)
Bicarbonate: 53 mmol/L — ABNORMAL HIGH (ref 20.0–28.0)
O2 Saturation: 95.8 %
Patient temperature: 37
pCO2 arterial: 62 mmHg — ABNORMAL HIGH (ref 32.0–48.0)
pH, Arterial: 7.54 — ABNORMAL HIGH (ref 7.350–7.450)
pO2, Arterial: 71 mmHg — ABNORMAL LOW (ref 83.0–108.0)

## 2020-04-11 LAB — CBC
HCT: 51.1 % — ABNORMAL HIGH (ref 36.0–46.0)
Hemoglobin: 15.3 g/dL — ABNORMAL HIGH (ref 12.0–15.0)
MCH: 28.2 pg (ref 26.0–34.0)
MCHC: 29.9 g/dL — ABNORMAL LOW (ref 30.0–36.0)
MCV: 94.1 fL (ref 80.0–100.0)
Platelets: 191 10*3/uL (ref 150–400)
RBC: 5.43 MIL/uL — ABNORMAL HIGH (ref 3.87–5.11)
RDW: 17.8 % — ABNORMAL HIGH (ref 11.5–15.5)
WBC: 13.9 10*3/uL — ABNORMAL HIGH (ref 4.0–10.5)
nRBC: 0 % (ref 0.0–0.2)

## 2020-04-11 LAB — BLOOD GAS, VENOUS
Acid-Base Excess: 24.9 mmol/L — ABNORMAL HIGH (ref 0.0–2.0)
Bicarbonate: 51.9 mmol/L — ABNORMAL HIGH (ref 20.0–28.0)
O2 Saturation: 95.4 %
Patient temperature: 37
pCO2, Ven: 58 mmHg (ref 44.0–60.0)
pH, Ven: 7.56 — ABNORMAL HIGH (ref 7.250–7.430)
pO2, Ven: 67 mmHg — ABNORMAL HIGH (ref 32.0–45.0)

## 2020-04-11 LAB — URINE CULTURE: Culture: NO GROWTH

## 2020-04-11 LAB — MAGNESIUM: Magnesium: 2.2 mg/dL (ref 1.7–2.4)

## 2020-04-11 LAB — LEGIONELLA PNEUMOPHILA SEROGP 1 UR AG: L. pneumophila Serogp 1 Ur Ag: NEGATIVE

## 2020-04-11 MED ORDER — METOPROLOL TARTRATE 25 MG PO TABS
25.0000 mg | ORAL_TABLET | Freq: Two times a day (BID) | ORAL | Status: DC
  Administered 2020-04-11: 09:00:00 25 mg via ORAL
  Filled 2020-04-11 (×2): qty 1

## 2020-04-11 MED ORDER — INSULIN DETEMIR 100 UNIT/ML ~~LOC~~ SOLN
10.0000 [IU] | Freq: Every day | SUBCUTANEOUS | Status: DC
  Administered 2020-04-11 – 2020-04-12 (×2): 10 [IU] via SUBCUTANEOUS
  Filled 2020-04-11 (×2): qty 0.1

## 2020-04-11 MED ORDER — LOSARTAN POTASSIUM 25 MG PO TABS
100.0000 mg | ORAL_TABLET | Freq: Every day | ORAL | Status: DC
  Administered 2020-04-11 – 2020-04-21 (×11): 100 mg via ORAL
  Filled 2020-04-11: qty 4
  Filled 2020-04-11 (×2): qty 2
  Filled 2020-04-11: qty 4
  Filled 2020-04-11 (×4): qty 2
  Filled 2020-04-11: qty 4
  Filled 2020-04-11: qty 2

## 2020-04-11 MED ORDER — POTASSIUM CHLORIDE 10 MEQ/100ML IV SOLN
10.0000 meq | INTRAVENOUS | Status: DC
  Administered 2020-04-11 (×2): 10 meq via INTRAVENOUS
  Filled 2020-04-11 (×4): qty 100

## 2020-04-11 MED ORDER — POTASSIUM CHLORIDE 20 MEQ PO PACK
40.0000 meq | PACK | Freq: Once | ORAL | Status: AC
  Administered 2020-04-11: 17:00:00 40 meq via ORAL
  Filled 2020-04-11: qty 2

## 2020-04-11 NOTE — Evaluation (Signed)
Clinical/Bedside Swallow Evaluation Patient Details  Name: Martha [name] MRN: [id number] Date of Birth: 1945-08-05  Today's Date: 04/11/2020 Time: SLP Start Time (ACUTE ONLY): 1435 SLP Stop Time (ACUTE ONLY): 1530 SLP Time Calculation (min) (ACUTE ONLY): 55 min  Past Medical History:  Past Medical History:  Diagnosis Date  . Abnormal LFTs (liver function tests)   . Anxiety   . Arthritis    knees, feet  . Asthma   . Chronic bronchitis ([place])   . Chronic constipation   . Chronic low back pain   . Cigarette smoker    Has cut back Smoking to 1 pack every other day  . COPD (chronic obstructive pulmonary disease) ([place])   . Depression   . Diabetic neuropathy ([place])   . Diverticulitis 2015   gi recommended repeat scope in 10 years  . Essential hypertension   . Fatty liver   . GERD (gastroesophageal reflux disease)   . Gout   . History of mammogram 05/28/2013  . Hyperlipidemia   . Hypothyroidism   . Insomnia   . Iron deficiency anemia   . Low serum vitamin D   . Personal history of tobacco use, presenting hazards to health 09/29/2015  . Plantar fasciitis   . RLS (restless legs syndrome)   . Shortness of breath dyspnea   . Thyroid cancer ([place]) 2009  . Tremors of nervous system    "I think I have parkinson's disease"  . Type 2 diabetes mellitus ([place])   . Wears dentures    uppers   Past Surgical History:  Past Surgical History:  Procedure Laterality Date  . CATARACT EXTRACTION W/PHACO Left 11/24/2015   Procedure: CATARACT EXTRACTION PHACO AND INTRAOCULAR LENS PLACEMENT (IOC);  Surgeon: Birder Robson, MD;  Location: [facility] ORS;  Service: Ophthalmology;  Laterality: Left;  [place] 38.3 AP% 20.6 CDE 7.89 FLUID PACK LOT # [id number]  . CATARACT EXTRACTION W/PHACO Right 06/23/2019   Procedure: CATARACT EXTRACTION PHACO AND INTRAOCULAR LENS PLACEMENT ([place]) RIGHT DIABETIC;  Surgeon: Birder Robson, MD;  Location: [facility];  Service: Ophthalmology;  Laterality: Right;  Diabetic   . COLONOSCOPY  2004, 2009, 2015   [place]  . POLYPECTOMY  2009  . THYROIDECTOMY  2009   states she was tx with radiactive iodine  . TUBAL LIGATION    . UPPER GASTROINTESTINAL ENDOSCOPY     HPI:  Pt  is a 74 y.o. female with a medical history significant for Multiple issues including GERD, restless leg syndrome, essential tremors, HTN, DM, COPD, tobacco use, gout, depression/anxiety and chronic lower extremity edema/cellulitis.  Patient presented to the [facility] ED 04/03/20 for leg edema and worsening shortness of breath and was found with O2 saturations in the 80's. She was found to have a BNP of 1113 and imaging showed mild cardiomegaly and pulmonary edema. Her lab work also showed worsening proteinuria. She was admitted for further work up.  per Pulmonary, pt has dx of Acute on chronic hypoxemic respiratory failure; BiPAP at night, on increased [place] O2 support during the day.  Pt has some degree of Cognitive decline - decreased attention and follow through w/ tasks w/out cues; often calling out Loudly.  CXR: Unchanged multifocal patchy airspace opacities within both lungs  which could be due to multifocal pneumonia.   Assessment / Plan / Recommendation Clinical Impression  Pt appears to present w/ grossly adequate oropharyngeal phase swallow w/ No oropharyngeal phase dysphagia noted, No neuromuscular deficits noted. Pt consumed po trials w/ No overt, clinical s/s of  aspiration during po trials. Pt appears at reduced risk for aspiration from an oropharyngeal phase standpoint following general aspiration precautions. However, pt does exhibit significant deconditioning and quick WOB w/ any exertion. She appeared min Impulsive when drinking liquids, "water" being requested often. This impulsivity and WOB w/ exertion can increase risk for aspiration, choking. During po trials, pt consumed all consistencies w/ no immediate, overt coughing, decline in vocal quality, or change in respiratory presentation  during/post trials. O2 sats remained mid-upper90s. Encouraged pt to slow down and to take Rest Breaks to lessen increased WOB w/ exertion of po tasks, risk for aspiration. Oral phase appeared [facility] w/ timely bolus management and control of bolus propulsion for A-P transfer for swallowing. Pt had to use extra Time for mashing and gumming the foods d/t Edentulous status - she does not wear a Denture plate per her report. Oral clearing achieved w/ all trial consistencies given Time. OM Exam appeared [facility] w/ no unilateral weakness noted. Speech Clear. Pt fed self w/ setup support. Recommend a Minced diet consistency, moistened foods; Thin liquids. Recommend general aspiration precautions - monitor straw use and drink SLOWLY, REDUCE Distrations during meals. Pills WHOLE in Puree for safer, easier swallowing as needed. Also educated pt on Rest Breaks and eating/drinking SLOWLY during meals for conservation of energy and reducing risk for aspiration. Education given on Pills in Puree; food consistencies and easy to eat options; general aspiration and Reflux precautions. Handouts given. ST services will montior pt's status during admission; NSG to immediately reconsult if any new needs arise while admitted. NSG agreed. MD updated. SLP Visit Diagnosis: Dysphagia, unspecified (R13.10)    Aspiration Risk  Mild aspiration risk;Risk for inadequate nutrition/hydration (reduced following precautions)    Diet Recommendation  Dysphagia level 2 (minced; purees) w/ Thin liquids; aspiration precautions - must sit Fully Upright w/ head forward and help to Hold Cup when drinking. Rest Breaks to lessen WOB and diet consistency for conservation of energy.  Medication Administration: Whole meds with puree (for safer swallowing)    Other  Recommendations Recommended Consults:  (Dietician f/u) Oral Care Recommendations: Oral care BID;Oral care before and after PO;Staff/trained caregiver to provide oral care Other Recommendations:   (n/a)   Follow up Recommendations None      Frequency and Duration min 2x/week  1 week       Prognosis Prognosis for Safe Diet Advancement: Fair (-Good) Barriers to Reach Goals: Time post onset;Severity of deficits;Behavior      Swallow Study   General Date of Onset: 04/03/20 HPI: Pt  is a 74 y.o. female with a medical history significant for Multiple issues including GERD, restless leg syndrome, essential tremors, HTN, DM, COPD, tobacco use, gout, depression/anxiety and chronic lower extremity edema/cellulitis.  Patient presented to the [facility] ED 04/03/20 for leg edema and worsening shortness of breath and was found with O2 saturations in the 80's. She was found to have a BNP of 1113 and imaging showed mild cardiomegaly and pulmonary edema. Her lab work also showed worsening proteinuria. She was admitted for further work up.  per Pulmonary, pt has dx of Acute on chronic hypoxemic respiratory failure; BiPAP at night, on increased [place] O2 support during the day.  Pt has some degree of Cognitive decline - decreased attention and follow through w/ tasks w/out cues; often calling out Loudly.  CXR: Unchanged multifocal patchy airspace opacities within both lungs  which could be due to multifocal pneumonia. Type of Study: Bedside Swallow Evaluation Previous Swallow Assessment: none Diet  Prior to this Study: NPO (has been on a regular diet prior) Temperature Spikes Noted: No (wbc 13.9) Respiratory Status: Nasal cannula (8-14L) History of Recent Intubation: No Behavior/Cognition: Alert;Cooperative;Pleasant mood;Confused;Distractible;Requires cueing Oral Cavity Assessment: Dry Oral Care Completed by SLP: Yes Oral Cavity - Dentition: Edentulous Vision: Functional for self-feeding Self-Feeding Abilities: Able to feed self;Needs assist;Needs set up (UE tremors) Patient Positioning: Upright in bed (needed support) Baseline Vocal Quality: Normal (min gravely) Volitional Cough:  Strong;Congested Volitional Swallow: Able to elicit    Oral/Motor/Sensory Function Overall Oral Motor/Sensory Function: Within functional limits   Ice Chips Ice chips: Within functional limits Presentation: Spoon (fed; 3 trials)   Thin Liquid Thin Liquid: Within functional limits Presentation: Cup;Self Fed;Straw (~12 ozs total mostly via straw) Other Comments: cough x1 b/t trials; phlegm expectorated. Shaky UEs so drank mostly via Straw    Nectar Thick Nectar Thick Liquid: Not tested   Honey Thick Honey Thick Liquid: Not tested   Puree Puree: Within functional limits Presentation: Spoon (fed; 10+ trials)   Solid     Solid: Impaired Presentation: Spoon (fed; 6 trials) Oral Phase Impairments: Impaired mastication (Edentulous) Oral Phase Functional Implications:  (Time) Pharyngeal Phase Impairments:  (none) Other Comments: pt required increased Time for mashing/gumming increased texture; min increased WOB w/ exertion noted        Orinda Kenner, MS, [employer] Speech Language Pathologist Rehab Services [phone] Jimia Gentles 04/11/2020,3:43 PM

## 2020-04-11 NOTE — Progress Notes (Signed)
OT Cancellation Note  Patient Details Name: Martha [name] MRN: [id number] DOB: December 26, 1945   Cancelled Treatment:    Reason Eval/Treat Not Completed: Medical issues which prohibited therapy. Chart reviewed. Pt noted to have transferred to the ICU 2/2 change in medical status requiring higher level of care. No continue at transfer in place. Will complete current OT order. Please re-consult at later date/time when pt is medically appropriate.   Jeni Salles, MPH, MS, OTR/L ascom [phone] 04/11/20, 8:32 AM

## 2020-04-11 NOTE — Progress Notes (Signed)
[facility] ([place]) Cardiology  SUBJECTIVE: Patient laying in bed, on O2 by nasal cannula, alert, answering questions, denies chest pain   Vitals:   04/11/20 0400 04/11/20 0500 04/11/20 0600 04/11/20 0700  BP: (!) 154/68 (!) 181/96 (!) 171/101 (!) 179/100  Pulse: 96 92 88 87  Resp: 20 18 16 18   Temp: (!) 97.5 F (36.4 C)     TempSrc: Axillary     SpO2: 94% 98% 97% 97%  Weight:      Height:         Intake/Output Summary (Last 24 hours) at 04/11/2020 7824 Last data filed at 04/11/2020 0200 Gross per 24 hour  Intake 1437.5 ml  Output 2407 ml  Net -969.5 ml      PHYSICAL EXAM  General: Well developed, well nourished, in no acute distress HEENT:  Normocephalic and atramatic Neck:  No JVD.  Lungs: Clear bilaterally to auscultation and percussion. Heart: HRRR . Normal S1 and S2 without gallops or murmurs.  Abdomen: Bowel sounds are positive, abdomen soft and non-tender  Msk:  Back normal, normal gait. Normal strength and tone for age. Extremities: No clubbing, cyanosis or edema.   Neuro: Alert and oriented X 3. Psych:  Good affect, responds appropriately   LABS: Basic Metabolic Panel: Recent Labs    04/10/20 0517 04/10/20 1444 04/11/20 0428  NA 149* 147* 147*  K 2.4* 3.1* 3.3*  CL 93* 94* 94*  CO2 40* 38* 39*  GLUCOSE 195* 228* 220*  BUN 39* 42* 40*  CREATININE 0.56 0.61 0.62  CALCIUM 9.7 9.2 9.2  MG 2.4  --  2.2   Liver Function Tests: Recent Labs    04/10/20 0517 04/10/20 1040 04/11/20 0428  AST 15  --  19  ALT 15  --  16  ALKPHOS 82  --  74  BILITOT 1.8* 1.5* 2.1*  PROT 7.5  --  7.0  ALBUMIN 4.9  --  4.4   Recent Labs    04/09/20 0438  LIPASE 27   CBC: Recent Labs    04/10/20 0517 04/11/20 0428  WBC 11.0* 13.9*  HGB 15.9* 15.3*  HCT 51.4* 51.1*  MCV 93.3 94.1  PLT 173 191   Cardiac Enzymes: No results for input(s): CKTOTAL, CKMB, CKMBINDEX, TROPONINI in the last 72 hours. BNP: Invalid input(s): POCBNP D-Dimer: No results for input(s): DDIMER in  the last 72 hours. Hemoglobin A1C: No results for input(s): HGBA1C in the last 72 hours. Fasting Lipid Panel: No results for input(s): CHOL, HDL, LDLCALC, TRIG, CHOLHDL, LDLDIRECT in the last 72 hours. Thyroid Function Tests: No results for input(s): TSH, T4TOTAL, T3FREE, THYROIDAB in the last 72 hours.  Invalid input(s): FREET3 Anemia Panel: No results for input(s): VITAMINB12, FOLATE, FERRITIN, TIBC, IRON, RETICCTPCT in the last 72 hours.  No results found.   Echo EF 55 to 60%  TELEMETRY: Sinus rhythm:  ASSESSMENT AND PLAN:  Principal Problem:   Acute CHF (congestive heart failure) (HCC) Active Problems:   COPD exacerbation (HCC)   Diabetes mellitus without complication ([place])   Current tobacco use   Hyperlipidemia   Essential hypertension   Hypothyroidism   Gout   Depression   Tremors of nervous system   Acute respiratory failure with hypoxia (HCC)   Cellulitis of lower extremity   Acute pulmonary edema (HCC)   Elevated brain natriuretic peptide (BNP) level   Nephrotic syndrome    1. Acute on chronic diastolic congestive heart failure, LV EF 55 to 60% by 2D echocardiogram,  furosemide 40 mg  IV as needed 2. Acute on chronic hypoxic respiratory failure, multifactorial, secondary to CHF, and right lower lobe pneumonia, followed by pulmonary 3.Metabolic encephalopathy, patient appears more alert today 4.Nephrotic syndrome, followed by nephrology 5.Essential hypertension,on amlodipine and losartan,blood pressure remains elevated 6.  Hypokalemia  Recommendations  1.Agree with overall current therapy 2.Continue diuresis as needed 3.Carefully monitor renal status 4.Defer further cardiac diagnostics at this time 5.   Add metoprolol tartrate 25 mg twice daily   Isaias Cowman, MD, PhD, [facility] 04/11/2020 8:42 AM

## 2020-04-11 NOTE — Progress Notes (Signed)
Patient coughing when medication given this am with sips of water. Dr Cathleen Fears notified.

## 2020-04-11 NOTE — Progress Notes (Addendum)
PROGRESS NOTE    Martha [name]  ZOX:[id number] DOB: 21-Sep-1945 DOA: 04/03/2020 PCP: Tracie Harrier, MD   Brief Narrative: Taken from H&P Martha [name] is a 74 y.o. female with medical history significant of  HTN, HLD, DM, COPD, tobacco abuse, hypothyroidism, gout, depression, tremor, proteinuria, depression, anxiety, GERD, diverticulitis, who presents with bilateral leg edema and pain, and SOB.  Patient states that she has chronic bilateral leg edema which has been progressively worsened recently. Both lower legs are painful and erythematous. She states that she gets her lower extremities wrapped once a week.  She has chronic cough and shortness of breath which has slightly worsened on exertion.  She coughs up some white mucus.  No fever or chills.  No chest pain.  On presentation she was hypoxic requiring BiPAP, later transitioned to 4 L of oxygen. Patient also has an history of protein urea which is being worked up by nephrology as an outpatient and they are planning for biopsy.  Patient had pertinent labs positive for BNP of 1113, troponin and COVID-19 negative.  Chest x-ray with borderline cardiomegaly and mild pulmonary edema.  Significant edema of bilateral lower extremities with signs of venous congestion, skin peeling and few shallow ulcers.  Cardiology was consulted.  AM of 12/11 altered, worsening respiratory status. Placed on bipap, transferred to step-down, re-started abx and steroids.  Subjective: Overnight off bipap. More awake and alert, answering questions, knows is in hospital, thinks year is 2020. Intermittently agitated but not at time of questioning. Denies pain.   Assessment & Plan:   Principal Problem:   Acute CHF (congestive heart failure) (HCC) Active Problems:   COPD exacerbation (HCC)   Diabetes mellitus without complication ([place])   Current tobacco use   Hyperlipidemia   Essential hypertension   Hypothyroidism   Gout   Depression   Tremors of nervous  system   Acute respiratory failure with hypoxia (HCC)   Cellulitis of lower extremity   Acute pulmonary edema (HCC)   Elevated brain natriuretic peptide (BNP) level   Nephrotic syndrome  Acute toxic metabolic encephalopathy Improving, off bipap overnight, today more lucid and awake. VBG shows continued alkallos likely contraction with hypercarbia. Volume depleted from aggressive diuresis for nephrotic syndrome, acute hypoxic respiratory failure from copd/pna. - cont zosyn/azith - cont solu-medrol - continue d5 @ 50 - continue step down, may be stable for progressive bed this pm - pulm following  Abdominal pain  On 12/10, has not recurred, would not sit still for CT. - monitor  Hyperbilirubinemia Indirect bili elevated, normal LFTS. Smear, ldh, cbc not suggestive of hemolysis. Unlikely hepatic/biliary in nature given normal lfts and alk phos, no ruq pain/tenderness - check smear, inr, fractionated bili, ldh, hapto - think unlikely bili/hepatic given normal lfts and alk phos but will check hepatic u/s - monitor  Urinary retention Post-void bladder scan 12/12 afternoon showed residual of 600. May be secondary to hydralazine, have d/cd that and loratadine. Has pure-wick so tracking post-voids is difficult. Urine output 2.5 L yesterday so not acutely retaining. - will ask nursing to monitor post-void residuals today.  Acute hypoxic respiratory failure due to combination of acute on chronic CHF and COPD exacerbation with right lung pneumonia.  TTE w/ diastolic dysfunction. Here bnp elevated and uptrending. S/p aggressive diuresis. Normal stress testing done in 2019.  CT obtained 12/8 for hemoptysis w/ atelectasis vs multifocal pna. covid neg on admission. CTA neg for PE. Troponins mildly elevated and flat, do not think acs - off  lasix/albumin - cont O2 Slick @ 8 - cont zosyn/azithromycin - cont solu-medrol - unable to give sputum sample - blood cultures ngtd - repeat vbg in PM, if stable  consider transfer back to floor  Choking Nurse reports some gagging when taking meds this morning. Current pna poss aspiration - slp eval  Hemoptysis  Small volume, blood tinged sputum. No signs respiratory compromise. Suspect 2/2 coughing. Ct w/ contrast showing atelectasis/infiltrate, neg for PE  Proteinuria/nephrotic syndrome.  Patient is being managed by nephrology as an outpatient.  Can be contributing to her lower extremity edema.  Nephrology is following and they start her on Lasix infusion instead of boluses. - kidney biopsy now on hold - off lasix/albumin  Bilateral lower extremity venous stasis ulcerss.   Wound care consulted, compression wrapped. S/p ceftriaxone/keflex 5 days. Edema much improved - continue compression therapy  Bacteriuria Patient was unable to specify UTI symptoms but appears pan positive. UA with pyuria, bacteriuria and positive nitrites. -Urine culture growing E. Coli pan sensitive - s/p 5 day course ceftriaxone>keflex  Hypokalemia 3.3 today, mg wnl, after 2 days aggressive IV repletion - kcl 10 IV x4  Type 2 diabetes mellitus.  A1c of 6.5.  Patient was on Metformin and glipizide at home. -Continue with SSI. - add levemir 10  Tobacco abuse.  Patient is a heavy smoker  - nicotine patch  Essential hypertension.   BP elevated - Continue with Cozaar, increase to 100 - added amlodipine - cardiology has added metoprolol -Continue with as needed iv labet.  Hypothyroidism. -Continue with home dose of Synthroid  Hyperlipidemia. -Continue with Crestor.  History of gout.  No acute concerns.  History of depression. - given mental status have stopped sertraline  History of tremors. -stop home primidone given encephalopathy  Chronic pain - hold home oxy, have weaned off gabapentin  Objective: Vitals:   04/11/20 0730 04/11/20 0800 04/11/20 0830 04/11/20 0900  BP: (!) 168/68 (!) 152/77 (!) 190/76 (!) 151/55  Pulse: 98 71 92 63  Resp: '18 20  19 20  ' Temp:      TempSrc:      SpO2: 95% 98% 100% 94%  Weight:      Height:        Intake/Output Summary (Last 24 hours) at 04/11/2020 1043 Last data filed at 04/11/2020 0200 Gross per 24 hour  Intake 1337.5 ml  Output 2407 ml  Net -1069.5 ml   Filed Weights   04/08/20 0351 04/09/20 0406 04/09/20 1034  Weight: 72.3 kg 69.2 kg 66.9 kg    Examination:  General.  Obese elderly lady, in bed awake, answers simple questions, relatively alert Pulmonary.  Decreased sounds on right, no wheeze CV.  Tachycardic, rr, soft syscolic murmur Abdomen.  Mild distention, no ttp CNS.  Moving all 4 extremities Extremities.  1+ LE edema, bilateral lower extremities with compression bandages Psychiatry.  Not agitated  DVT prophylaxis: Lovenox Code Status: Full Family Communication: Daughter was updated @ bedside 12/12 Disposition Plan: TBD Status is: Inpatient  Remains inpatient appropriate because:Inpatient level of care appropriate due to severity of illness   Dispo: The patient is from: Home              Anticipated d/c is to: SNF (has bed, will just need covid test day of discharge)              Anticipated d/c date is: tbd              Patient currently is not  medically stable to d/c.    Consultants:   Cardiology  Pulmonology  Nephrology  Procedures:  Antimicrobials:  Ceftriaxone>keflex, finished zosyn/azithromycin 12/10>  Data Reviewed: I have personally reviewed following labs and imaging studies  CBC: Recent Labs  Lab 04/07/20 0416 04/10/20 0517 04/11/20 0428  WBC 8.6 11.0* 13.9*  HGB 14.1 15.9* 15.3*  HCT 45.5 51.4* 51.1*  MCV 91.9 93.3 94.1  PLT 177 173 893   Basic Metabolic Panel: Recent Labs  Lab 04/07/20 0416 04/08/20 0415 04/09/20 0438 04/10/20 0517 04/10/20 1444 04/11/20 0428  NA 138 139 143 149* 147* 147*  K 3.6 3.7 2.9* 2.4* 3.1* 3.3*  CL 88* 84* 87* 93* 94* 94*  CO2 36* 37* 39* 40* 38* 39*  GLUCOSE 90 158* 195* 195* 228* 220*  BUN 25*  25* 26* 39* 42* 40*  CREATININE 0.65 0.61 0.58 0.56 0.61 0.62  CALCIUM 8.7* 9.6 9.7 9.7 9.2 9.2  MG 2.1 1.8 2.3 2.4  --  2.2   GFR: Estimated Creatinine Clearance: 57.2 mL/min (by C-G formula based on SCr of 0.62 mg/dL). Liver Function Tests: Recent Labs  Lab 04/09/20 0438 04/10/20 0517 04/10/20 1040 04/11/20 0428  AST 16 15  --  19  ALT 18 15  --  16  ALKPHOS 81 82  --  74  BILITOT 1.9* 1.8* 1.5* 2.1*  PROT 7.8 7.5  --  7.0  ALBUMIN 5.3* 4.9  --  4.4   Recent Labs  Lab 04/09/20 0438  LIPASE 27   Recent Labs  Lab 04/08/20 1002  AMMONIA 34   Coagulation Profile: Recent Labs  Lab 04/07/20 0756 04/10/20 1040  INR 1.1 1.1   Cardiac Enzymes: No results for input(s): CKTOTAL, CKMB, CKMBINDEX, TROPONINI in the last 168 hours. BNP (last 3 results) No results for input(s): PROBNP in the last 8760 hours. HbA1C: No results for input(s): HGBA1C in the last 72 hours. CBG: Recent Labs  Lab 04/10/20 1257 04/10/20 1625 04/10/20 1640 04/10/20 2215 04/11/20 0749  GLUCAP 219* 204* 224* 241* 286*   Lipid Profile: No results for input(s): CHOL, HDL, LDLCALC, TRIG, CHOLHDL, LDLDIRECT in the last 72 hours. Thyroid Function Tests: No results for input(s): TSH, T4TOTAL, FREET4, T3FREE, THYROIDAB in the last 72 hours. Anemia Panel: No results for input(s): VITAMINB12, FOLATE, FERRITIN, TIBC, IRON, RETICCTPCT in the last 72 hours. Sepsis Labs: Recent Labs  Lab 04/07/20 0416 04/08/20 1222  PROCALCITON <0.10 <0.10    Recent Results (from the past 240 hour(s))  Resp Panel by RT-PCR (Flu A&B, Covid) Nasopharyngeal Swab     Status: None   Collection Time: 04/03/20 11:41 AM   Specimen: Nasopharyngeal Swab; Nasopharyngeal(NP) swabs in vial transport medium  Result Value Ref Range Status   SARS Coronavirus 2 by RT PCR NEGATIVE NEGATIVE Final    Comment: (NOTE) SARS-CoV-2 target nucleic acids are NOT DETECTED.  The SARS-CoV-2 RNA is generally detectable in upper  respiratory specimens during the acute phase of infection. The lowest concentration of SARS-CoV-2 viral copies this assay can detect is 138 copies/mL. A negative result does not preclude SARS-Cov-2 infection and should not be used as the sole basis for treatment or other patient management decisions. A negative result may occur with  improper specimen collection/handling, submission of specimen other than nasopharyngeal swab, presence of viral mutation(s) within the areas targeted by this assay, and inadequate number of viral copies(<138 copies/mL). A negative result must be combined with clinical observations, patient history, and epidemiological information. The expected result is Negative.  Fact Sheet for Patients:  EntrepreneurPulse.com.au  Fact Sheet for Healthcare Providers:  IncredibleEmployment.be  This test is no t yet approved or cleared by the [place] FDA and  has been authorized for detection and/or diagnosis of SARS-CoV-2 by FDA under an Emergency Use Authorization (EUA). This EUA will remain  in effect (meaning this test can be used) for the duration of the COVID-19 declaration under Section 564(b)(1) of the Act, 21 U.S.C.section 360bbb-3(b)(1), unless the authorization is terminated  or revoked sooner.       Influenza A by PCR NEGATIVE NEGATIVE Final   Influenza B by PCR NEGATIVE NEGATIVE Final    Comment: (NOTE) The Xpert Xpress SARS-CoV-2/FLU/RSV plus assay is intended as an aid in the diagnosis of influenza from Nasopharyngeal swab specimens and should not be used as a sole basis for treatment. Nasal washings and aspirates are unacceptable for Xpert Xpress SARS-CoV-2/FLU/RSV testing.  Fact Sheet for Patients: EntrepreneurPulse.com.au  Fact Sheet for Healthcare Providers: IncredibleEmployment.be  This test is not yet approved or cleared by the [place] FDA and has been  authorized for detection and/or diagnosis of SARS-CoV-2 by FDA under an Emergency Use Authorization (EUA). This EUA will remain in effect (meaning this test can be used) for the duration of the COVID-19 declaration under Section 564(b)(1) of the Act, 21 U.S.C. section 360bbb-3(b)(1), unless the authorization is terminated or revoked.  Performed at [facility], [address] [place], [place] [address]   Culture, blood (Routine X 2) w Reflex to ID Panel     Status: None   Collection Time: 04/03/20  3:27 PM   Specimen: BLOOD  Result Value Ref Range Status   Specimen Description BLOOD BLOOD RIGHT ARM  Final   Special Requests   Final    BOTTLES DRAWN AEROBIC AND ANAEROBIC Blood Culture adequate volume   Culture   Final    NO GROWTH 5 DAYS Performed at [facility], [address], [place], [place] [address]    Report Status 04/08/2020 FINAL  Final  Urine Culture     Status: Abnormal   Collection Time: 04/03/20  4:00 PM   Specimen: Urine, Random  Result Value Ref Range Status   Specimen Description   Final    URINE, RANDOM Performed at [facility], [address] [place], [place] [address]    Special Requests   Final    NONE Performed at [facility], [address] [place], [place] [address]    Culture (A)  Final    >=100,000 COLONIES/mL ESCHERICHIA COLI >=100,000 COLONIES/mL AEROCOCCUS URINAE Standardized susceptibility testing for this organism is not available. Performed at [facility], [place] [address] [place],  [address]    Report Status 04/07/2020 FINAL  Final   Organism ID, Bacteria ESCHERICHIA COLI (A)  Final      Susceptibility   Escherichia coli - MIC*    AMPICILLIN 4 SENSITIVE Sensitive     CEFAZOLIN <=4 SENSITIVE Sensitive     CEFEPIME <=0.12 SENSITIVE Sensitive     CEFTRIAXONE <=0.25 SENSITIVE Sensitive     CIPROFLOXACIN <=0.25 SENSITIVE Sensitive     GENTAMICIN <=1 SENSITIVE Sensitive     IMIPENEM  <=0.25 SENSITIVE Sensitive     NITROFURANTOIN 32 SENSITIVE Sensitive     TRIMETH/SULFA <=20 SENSITIVE Sensitive     AMPICILLIN/SULBACTAM <=2 SENSITIVE Sensitive     PIP/TAZO <=4 SENSITIVE Sensitive     * >=100,000 COLONIES/mL ESCHERICHIA COLI  Culture, blood (Routine X 2) w  Reflex to ID Panel     Status: None   Collection Time: 04/03/20 10:36 PM   Specimen: BLOOD  Result Value Ref Range Status   Specimen Description BLOOD BLOOD LEFT HAND  Final   Special Requests   Final    BOTTLES DRAWN AEROBIC AND ANAEROBIC Blood Culture adequate volume   Culture   Final    NO GROWTH 5 DAYS Performed at [facility], [address], [place], [place] [address]    Report Status 04/08/2020 FINAL  Final  Urine Culture     Status: None   Collection Time: 04/08/20  9:52 AM   Specimen: Urine, Random  Result Value Ref Range Status   Specimen Description   Final    URINE, RANDOM Performed at [facility], [address] [place], [place] [address]    Special Requests   Final    NONE Performed at [facility], [address] [place], [place] [address]    Culture   Final    NO GROWTH Performed at [facility], [place] [address], [place], [place] [address]    Report Status 04/09/2020 FINAL  Final  CULTURE, BLOOD (ROUTINE X 2) w Reflex to ID Panel     Status: None (Preliminary result)   Collection Time: 04/08/20 10:02 AM   Specimen: BLOOD  Result Value Ref Range Status   Specimen Description BLOOD BLOOD RIGHT HAND  Final   Special Requests   Final    BOTTLES DRAWN AEROBIC AND ANAEROBIC Blood Culture results may not be optimal due to an inadequate volume of blood received in culture bottles   Culture   Final    NO GROWTH 3 DAYS Performed at [facility], [address], [place], [place] [address]    Report Status PENDING  Incomplete  MRSA PCR Screening     Status: None   Collection Time: 04/08/20 10:04 AM   Specimen: Nasal Mucosa; Nasopharyngeal  Result  Value Ref Range Status   MRSA by PCR NEGATIVE NEGATIVE Final    Comment:        The GeneXpert MRSA Assay (FDA approved for NASAL specimens only), is one component of a comprehensive MRSA colonization surveillance program. It is not intended to diagnose MRSA infection nor to guide or monitor treatment for MRSA infections. Performed at [facility], [address] [place], [place] [address]   CULTURE, BLOOD (ROUTINE X 2) w Reflex to ID Panel     Status: None (Preliminary result)   Collection Time: 04/08/20 10:26 AM   Specimen: BLOOD  Result Value Ref Range Status   Specimen Description BLOOD BLOOD RIGHT HAND  Final   Special Requests   Final    BOTTLES DRAWN AEROBIC AND ANAEROBIC Blood Culture results may not be optimal due to an inadequate volume of blood received in culture bottles   Culture   Final    NO GROWTH 3 DAYS Performed at [facility], [address], [place], [place] [address]    Report Status PENDING  Incomplete     Radiology Studies: No results found.  Scheduled Meds: . amLODipine  10 mg Oral Daily  . vitamin C  1,000 mg Oral Daily  . Chlorhexidine Gluconate Cloth  6 each Topical Daily  . fluticasone  1 spray Each Nare BID  . insulin aspart  0-5 Units Subcutaneous QHS  . insulin aspart  0-9 Units Subcutaneous TID WC  . ipratropium-albuterol  3 mL Nebulization TID  . levothyroxine  150 mcg Oral Q0600  . losartan  50 mg Oral Daily  . methylPREDNISolone (SOLU-MEDROL) injection  20 mg Intravenous Q12H  . metoprolol tartrate  25 mg Oral BID  . nicotine  21 mg Transdermal Daily  . potassium chloride  40 mEq Oral BID  . rosuvastatin  5 mg Oral Daily  . sodium chloride flush  3 mL Intravenous Q12H  . vitamin B-12  1,000 mcg Oral Daily   Continuous Infusions: . sodium chloride Stopped (04/10/20 1451)  . dextrose 50 mL/hr at 04/10/20 2224  . piperacillin-tazobactam (ZOSYN)  IV 3.375 g (04/11/20 0526)  . potassium chloride       LOS: 8  days   Time spent: 35 minutes  Desma Maxim, MD [facility]  If 7PM-7AM, please contact night-coverage [email or web address]  04/11/2020, 10:43 AM

## 2020-04-11 NOTE — Progress Notes (Signed)
PT Cancellation Note  Patient Details Name: Martha [name] MRN: [id number] DOB: Dec 03, 1945   Cancelled Treatment:    Reason Eval/Treat Not Completed: Medical issues which prohibited therapy (Per chart review, patient noted with transfer to CCU due to decline medical status.  Per guidelines, will require new order to resume PT services. Please re-consult as medically appropriate.)   Cheyne Bungert H. Owens Shark, PT, DPT, NCS 04/11/20, 8:36 AM [phone]

## 2020-04-11 NOTE — Progress Notes (Signed)
Bladder scanned patient 597 ml. Orders per Dr. Cathleen Fears in and out cath. Dr. [employer] Bing made aware.

## 2020-04-11 NOTE — Progress Notes (Addendum)
Pt transitioned from 60% BIPAP to 8L HFNC. Pt tolerating current o2 settings @ this time.   Pt I/O cath'd @ 0200 - 600 ml amber urine out.   Pt remains confused to time and situation. Pt continues to be intermittently anxious, pulling @ mask + lines. Mitts remain on.   Turn q2. Pt in no acute distress @ this time. Will continue to monitor.   Near 0500, pt progressively waking up more, anxious/aggitated and hypertensive. PRN labetolol given w/o positive effects.

## 2020-04-11 NOTE — Progress Notes (Signed)
Inpatient Diabetes Program Recommendations  AACE/ADA: New Consensus Statement on Inpatient Glycemic Control (2015)  Target Ranges:  Prepandial:   less than 140 mg/dL      Peak postprandial:   less than 180 mg/dL (1-2 hours)      Critically ill patients:  140 - 180 mg/dL   Lab Results  Component Value Date   GLUCAP 286 (H) 04/11/2020   HGBA1C 6.5 (H) 04/03/2020    Review of Glycemic Control Results for Martha [name], Martha [name] (MRN [id number]) as of 04/11/2020 11:22  Ref. Range 04/10/2020 12:57 04/10/2020 16:25 04/10/2020 16:40 04/10/2020 22:15 04/11/2020 07:49  Glucose-Capillary Latest Ref Range: 70 - 99 mg/dL 219 (H) 204 (H) 224 (H) 241 (H) 286 (H)   Diabetes history: DM 2 Outpatient Diabetes medications:  Glucotrol 10 mg bid Metformin 500 mg bid Current orders for Inpatient glycemic control:  Novolog sensitive tid with meals and HS Prednisone 20 mg daily*  Inpatient Diabetes Program Recommendations:   While patient is on steroids & NPO: -Consider Levemir 10 units qd (0.15 units/kg x 66.9 kg) Secure chat sent to Dr. Si Raider.  Thank you, Nani Gasser. Glendia Olshefski, RN, MSN, CDE  Diabetes Coordinator Inpatient Glycemic Control Team Team Pager [phone] (8am-5pm) 04/11/2020 11:27 AM

## 2020-04-11 NOTE — Progress Notes (Signed)
Pulmonary and Critical Care  Medicine          Date: 04/11/2020,   MRN# [id number] Martha [name] 30-May-1945     AdmissionWeight: 72 kg                 CurrentWeight: 66.9 kg  Referring physician: Dr. Si Raider    CHIEF COMPLAINT:   Acute on chronic hypoxemic respiratory failure   HISTORY OF PRESENT ILLNESS   74 year old female with history of diabetes COPD lifelong smoking hypothyroidism gout major depressive disorder, anxiety disorder and GERD, diverticulitis who came in with signs and symptoms of anasarca including peripheral edema and pulmonary edema.  Patient states is been worse than usual despite compliance with wrapping the lower extremities.  She also reported coughing and dyspnea.  Upon presentation to the emergency room she was found to be desaturating in the mid 80s and was placed on 4 L supplemental oxygen with some subsequent improvement of SPO2.  She was then treated with BiPAP and felt better.  She was initially admitted with acute exacerbation of CHF and cardiology was consulted for this.  She was also found to have proteinuria with CKD and had renal consultation placed. Pulmonary consultation placed for acute on chronic hypoxemic respiratory failure with failure to wean down oxygen.  Patient did have CT PE protocol done to rule out acute pulmonary venous embolism which was negative however did show bullous emphysema with overlying pulmonary edema as well as a right lower lobe consolidated infiltrate suggestive of pneumonia as well as right hemidiaphragm elevation due to hepatomegaly.  04/09/20- patient seen at bedside, remains encephalopathic.  Discussed with [facility] attending Dr Si Raider plan for transfer to SDU.     04/10/20- patient is improved.  She is more awake, she was able to speak with me despite BIPAP mask, she has normal 4/4 grip bilaterally and moves LE to verbal communication without encouragment. Mentation has significantly improved. She is flushed red on  examination.    04/11/20- patient is improved.  She is off BIPAP during my evaluation.  She is able to speak and reported right upper extermity pain, this may be related to K infusion. I dicussed this with RN who has already addressed this with IV team. ABG is in progress. Cardiology and nephrology following, UOP is low.    PAST MEDICAL HISTORY   Past Medical History:  Diagnosis Date  . Abnormal LFTs (liver function tests)   . Anxiety   . Arthritis    knees, feet  . Asthma   . Chronic bronchitis ([place])   . Chronic constipation   . Chronic low back pain   . Cigarette smoker    Has cut back Smoking to 1 pack every other day  . COPD (chronic obstructive pulmonary disease) ([place])   . Depression   . Diabetic neuropathy ([place])   . Diverticulitis 2015   gi recommended repeat scope in 10 years  . Essential hypertension   . Fatty liver   . GERD (gastroesophageal reflux disease)   . Gout   . History of mammogram 05/28/2013  . Hyperlipidemia   . Hypothyroidism   . Insomnia   . Iron deficiency anemia   . Low serum vitamin D   . Personal history of tobacco use, presenting hazards to health 09/29/2015  . Plantar fasciitis   . RLS (restless legs syndrome)   . Shortness of breath dyspnea   . Thyroid cancer ([place]) 2009  . Tremors of nervous system    "  I think I have parkinson's disease"  . Type 2 diabetes mellitus ([place])   . Wears dentures    uppers     SURGICAL HISTORY   Past Surgical History:  Procedure Laterality Date  . CATARACT EXTRACTION W/PHACO Left 11/24/2015   Procedure: CATARACT EXTRACTION PHACO AND INTRAOCULAR LENS PLACEMENT (IOC);  Surgeon: Birder Robson, MD;  Location: [facility] ORS;  Service: Ophthalmology;  Laterality: Left;  [place] 38.3 AP% 20.6 CDE 7.89 FLUID PACK LOT # [id number]  . CATARACT EXTRACTION W/PHACO Right 06/23/2019   Procedure: CATARACT EXTRACTION PHACO AND INTRAOCULAR LENS PLACEMENT ([place]) RIGHT DIABETIC;  Surgeon: Birder Robson, MD;  Location: [facility];  Service: Ophthalmology;  Laterality: Right;  Diabetic  . COLONOSCOPY  2004, 2009, 2015   [place]  . POLYPECTOMY  2009  . THYROIDECTOMY  2009   states she was tx with radiactive iodine  . TUBAL LIGATION    . UPPER GASTROINTESTINAL ENDOSCOPY       FAMILY HISTORY   Family History  Problem Relation Age of Onset  . Diabetes Mellitus II Father   . Lung disease Father   . Diabetes Mellitus II Mother   . Arthritis Mother   . Thyroid disease Other   . Asthma Sister   . Emphysema Sister   . Hypertension Sister   . Breast cancer Paternal Aunt      SOCIAL HISTORY   Social History   Tobacco Use  . Smoking status: Current Every Day Smoker    Packs/day: 1.00    Years: 39.50    Pack years: 39.50    Types: Cigarettes  . Smokeless tobacco: Never Used  . Tobacco comment: curently smokes 12 cigerattes a day. I'm trying to cut back.  Vaping Use  . Vaping Use: Never used  Substance Use Topics  . Alcohol use: No    Alcohol/week: 0.0 standard drinks  . Drug use: No     MEDICATIONS    Home Medication:    Current Medication:  Current Facility-Administered Medications:  .  0.9 %  sodium chloride infusion, 250 mL, Intravenous, PRN, Ivor Costa, MD, Stopped at 04/10/20 1451 .  acetaminophen (TYLENOL) suppository 650 mg, 650 mg, Rectal, Q6H PRN, Lang Snow, NP, 650 mg at 04/08/20 2234 .  acetaminophen (TYLENOL) tablet 650 mg, 650 mg, Oral, Q6H PRN, Lorella Nimrod, MD, 650 mg at 04/06/20 1638 .  albuterol (PROVENTIL) (2.5 MG/3ML) 0.083% nebulizer solution 2.5 mg, 2.5 mg, Nebulization, Q4H PRN, Ivor Costa, MD .  amLODipine (NORVASC) tablet 10 mg, 10 mg, Oral, Daily, Wouk, Ailene Rud, MD, 10 mg at 04/11/20 [phone] .  ascorbic acid (VITAMIN C) tablet 1,000 mg, 1,000 mg, Oral, Daily, Ivor Costa, MD, 1,000 mg at 04/11/20 0924 .  Chlorhexidine Gluconate Cloth 2 % PADS 6 each, 6 each, Topical, Daily, Wouk, Ailene Rud, MD, 6 each at 04/10/20 1047 .   dextromethorphan-guaiFENesin (MUCINEX DM) 30-600 MG per 12 hr tablet 1 tablet, 1 tablet, Oral, BID PRN, Ivor Costa, MD, 1 tablet at 04/06/20 0945 .  dextrose 5 % solution, , Intravenous, Continuous, Kolluru, Sarath, MD, Last Rate: 50 mL/hr at 04/10/20 2224, New Bag at 04/10/20 2224 .  fluticasone (FLONASE) 50 MCG/ACT nasal spray 1 spray, 1 spray, Each Nare, BID, Sharion Settler, NP, 1 spray at 04/11/20 0920 .  fluticasone (FLONASE) 50 MCG/ACT nasal spray 1-2 spray, 1-2 spray, Each Nare, Daily PRN, Ivor Costa, MD .  insulin aspart (novoLOG) injection 0-5 Units, 0-5 Units, Subcutaneous, QHS, Ivor Costa, MD, 2 Units at  04/10/20 2221 .  insulin aspart (novoLOG) injection 0-9 Units, 0-9 Units, Subcutaneous, TID WC, Ivor Costa, MD, 3 Units at 04/11/20 1222 .  insulin detemir (LEVEMIR) injection 10 Units, 10 Units, Subcutaneous, Daily, Wouk, Ailene Rud, MD, 10 Units at 04/11/20 1248 .  ipratropium-albuterol (DUONEB) 0.5-2.5 (3) MG/3ML nebulizer solution 3 mL, 3 mL, Nebulization, TID, Ivor Costa, MD, 3 mL at 04/11/20 0756 .  labetalol (NORMODYNE) injection 20 mg, 20 mg, Intravenous, Q2H PRN, Wouk, Ailene Rud, MD, 20 mg at 04/11/20 0854 .  levothyroxine (SYNTHROID) tablet 150 mcg, 150 mcg, Oral, Q0600, Ivor Costa, MD, 150 mcg at 04/08/20 0616 .  [START ON 04/12/2020] losartan (COZAAR) tablet 100 mg, 100 mg, Oral, Daily, Wouk, Ailene Rud, MD .  methylPREDNISolone sodium succinate (SOLU-MEDROL) 40 mg/mL injection 20 mg, 20 mg, Intravenous, Q12H, Ottie Glazier, MD, 20 mg at 04/11/20 0253 .  metoprolol tartrate (LOPRESSOR) tablet 25 mg, 25 mg, Oral, BID, Paraschos, Alexander, MD, 25 mg at 04/11/20 0919 .  nicotine (NICODERM CQ - dosed in mg/24 hours) patch 21 mg, 21 mg, Transdermal, Daily, Lateef, Munsoor, MD, 21 mg at 04/11/20 0853 .  nitroGLYCERIN (NITROSTAT) SL tablet 0.4 mg, 0.4 mg, Sublingual, Q5 min PRN, Wouk, Ailene Rud, MD, 0.4 mg at 04/08/20 0659 .  ondansetron (ZOFRAN) injection 4 mg, 4 mg,  Intravenous, Q8H PRN, Ivor Costa, MD, 4 mg at 04/06/20 1543 .  piperacillin-tazobactam (ZOSYN) IVPB 3.375 g, 3.375 g, Intravenous, Q8H, Wouk, Ailene Rud, MD, Last Rate: 12.5 mL/hr at 04/11/20 0526, 3.375 g at 04/11/20 0526 .  potassium chloride 10 mEq in 100 mL IVPB, 10 mEq, Intravenous, Q1 Hr x 4, Wouk, Ailene Rud, MD, Last Rate: 100 mL/hr at 04/11/20 1224, 10 mEq at 04/11/20 1224 .  potassium chloride SA (KLOR-CON) CR tablet 40 mEq, 40 mEq, Oral, BID, Wouk, Ailene Rud, MD, 40 mEq at 04/08/20 1610 .  rosuvastatin (CRESTOR) tablet 5 mg, 5 mg, Oral, Daily, Ivor Costa, MD, 5 mg at 04/11/20 0924 .  sodium chloride flush (NS) 0.9 % injection 3 mL, 3 mL, Intravenous, Q12H, Ivor Costa, MD, 3 mL at 04/10/20 2214 .  sodium chloride flush (NS) 0.9 % injection 3 mL, 3 mL, Intravenous, PRN, Ivor Costa, MD .  vitamin B-12 (CYANOCOBALAMIN) tablet 1,000 mcg, 1,000 mcg, Oral, Daily, Ivor Costa, MD, 1,000 mcg at 04/11/20 9604    ALLERGIES   Nicotine polacrilex, Nicotrol [nicotine], and Codeine sulfate     REVIEW OF SYSTEMS    Review of Systems:  Unable to obtain due to encephalopathy   VS: BP (!) 164/84   Pulse (!) 59   Temp 97.8 F (36.6 C)   Resp 20   Ht '5\' 4"'  (1.626 m)   Wt 66.9 kg   SpO2 99%   BMI 25.32 kg/m      PHYSICAL EXAM    GENERAL:NAD, no fevers, chills, no weakness no fatigue HEAD: Normocephalic, atraumatic.  EYES: Pupils equal, round, reactive to light. Extraocular muscles intact. No scleral icterus.  MOUTH: dry mucosal membrane. Dentition intact. No abscess noted. On BIPAP EAR, NOSE, THROAT: Clear without exudates. No external lesions.  NECK: Supple. No thyromegaly. No nodules. No JVD.  PULMONARY: mild rhonchi bilaterally  CARDIOVASCULAR: S1 and S2. Regular rate and rhythm. No murmurs, rubs, or gallops. No edema. Pedal pulses 2+ bilaterally.  GASTROINTESTINAL: Soft, nontender, nondistended. No masses. Positive bowel sounds. No hepatosplenomegaly.   MUSCULOSKELETAL: No swelling, clubbing, +edema 2++.  NEUROLOGIC: GCS 10  SKIN: No ulceration, lesions, rashes, or cyanosis. Skin warm  and dry. Turgor intact.       IMAGING    DG Chest 1 View  Result Date: 04/09/2020 CLINICAL DATA:  Acute respiratory failure EXAM: CHEST  1 VIEW COMPARISON:  04/03/2016 FINDINGS: Normal cardiac silhouette. Interval increase in interstitial edema pattern. No focal consolidation. No pneumothorax. IMPRESSION: Worsening interstitial edema. Electronically Signed   By: Suzy Bouchard M.D.   On: 04/09/2020 06:31   DG Chest 2 View  Result Date: 04/03/2020 CLINICAL DATA:  Hypoxia. Leg edema and shortness of breath. Pt daughter states that pt is supposed to be getting a renal bx but they are not able to do it because of the edema in pts legs. Pt states that she does feel short of breath. Pt does not wear oxygen at home. Pt placed on O2 in triage due to sats being in the 80s. Hx of asthma, chronic bronchitis, COPD, thyroid cancer- 2009, DM, current smoker. EXAM: CHEST - 2 VIEW COMPARISON:  01/04/2018 FINDINGS: Cardiac silhouette is borderline enlarged. Stable aortic atherosclerotic calcifications. No mediastinal or hilar masses. No evidence of adenopathy. Lungs demonstrate diffuse bilateral irregular interstitial thickening which has increased compared to the prior study. There is also linear/reticular scarring in the left lung base, which is stable. No lung consolidation. No pleural effusion and no pneumothorax. Skeletal structures are demineralized, but grossly intact. IMPRESSION: 1. Bilateral irregular interstitial thickening, increased compared to the prior study, along with borderline cardiomegaly. Suspect mild congestive heart failure. Consider diffuse interstitial infection or inflammation in the proper clinical setting. No evidence of lobar pneumonia. Electronically Signed   By: Lajean Manes M.D.   On: 04/03/2020 13:06   CT CHEST W CONTRAST  Result Date:  04/06/2020 CLINICAL DATA:  Hemoptysis. EXAM: CT CHEST WITH CONTRAST TECHNIQUE: Multidetector CT imaging of the chest was performed during intravenous contrast administration. CONTRAST:  30m OMNIPAQUE IOHEXOL 300 MG/ML  SOLN COMPARISON:  January 06, 2019 FINDINGS: Cardiovascular: There is marked severity calcification of the aortic arch. There is mild cardiomegaly. No pericardial effusion. Mediastinum/Nodes: There is mild pretracheal and right hilar lymphadenopathy. Thyroid gland, trachea, and esophagus demonstrate no significant findings. Lungs/Pleura: Mild-to-moderate severity emphysematous lung disease is seen involving the bilateral upper lobes with stable bullous disease is seen along the medial aspect of the left apex. Mild to moderate severity areas of atelectasis and/or infiltrate are seen within the posterior aspects of the bilateral upper lobes and left lung base. Moderate to marked severity posterior right basilar atelectasis and/or infiltrate is noted. A very small right pleural effusion is seen. No pneumothorax is identified. Upper Abdomen: There is stable hepatosplenomegaly with stable, moderate severity diffuse bilateral adrenal gland enlargement. Musculoskeletal: A chronic lateral eighth left rib fracture is seen. A chronic compression fracture deformity of the T12 vertebral body is noted. IMPRESSION: 1. Mild-to-moderate severity posterior bilateral upper lobe and left basilar atelectasis and/or infiltrate. 2. Moderate to marked severity posterior right basilar atelectasis and/or infiltrate. 3. Very small right pleural effusion. 4. Mild cardiomegaly. 5. Stable emphysematous lung disease. 6. Chronic lateral eighth left rib fracture. 7. Chronic compression fracture deformity of the T12 vertebral body. 8. Aortic atherosclerosis. Aortic Atherosclerosis (ICD10-I70.0) and Emphysema (ICD10-J43.9). Electronically Signed   By: TVirgina NorfolkM.D.   On: 04/06/2020 15:33   CT ANGIO CHEST PE W OR WO  CONTRAST  Result Date: 04/07/2020 CLINICAL DATA:  Shortness of breath EXAM: CT ANGIOGRAPHY CHEST WITH CONTRAST TECHNIQUE: Multidetector CT imaging of the chest was performed using the standard protocol during bolus administration of intravenous contrast.  Multiplanar CT image reconstructions and MIPs were obtained to evaluate the vascular anatomy. CONTRAST:  81m OMNIPAQUE IOHEXOL 350 MG/ML SOLN COMPARISON:  April 06, 2020 FINDINGS: Cardiovascular: There is a optimal opacification of the pulmonary arteries. There is no central,segmental, or subsegmental filling defects within the pulmonary arteries. There is unchanged cardiomegaly. Mitral valve calcifications and coronary artery calcifications are seen. No pericardial effusion or thickening. No evidence right heart strain. There is normal three-vessel brachiocephalic anatomy without proximal stenosis. Scattered aortic atherosclerosis is noted. Mediastinum/Nodes: No hilar, mediastinal, or axillary adenopathy. Thyroid gland, trachea, and esophagus demonstrate no significant findings. Lungs/Pleura: Patchy airspace opacity seen at the left upper lung and posterior right lung base with air bronchograms. There are trace bilateral pleural effusions present. Centrilobular and paraseptal emphysematous changes seen at both lung apices. Upper Abdomen: No acute abnormalities present in the visualized portions of the upper abdomen. Musculoskeletal: No chest wall abnormality. No acute or significant osseous findings. There is a chronic anterior wedge compression deformity of the T12 vertebral body with 50% loss in vertebral body height. Review of the MIP images confirms the above findings. IMPRESSION: No central, segmental, or subsegmental pulmonary embolism. Unchanged multifocal patchy airspace opacities within both lungs which could be due to multifocal pneumonia Trace bilateral pleural effusions, right greater than left. Aortic Atherosclerosis (ICD10-I70.0). Electronically  Signed   By: BPrudencio PairM.D.   On: 04/07/2020 19:43   [place] Img Lower Bilateral (DVT)  Result Date: 04/03/2020 CLINICAL DATA:  Lower leg cellulitis EXAM: BILATERAL LOWER EXTREMITY VENOUS DOPPLER ULTRASOUND TECHNIQUE: Gray-scale sonography with graded compression, as well as color Doppler and duplex ultrasound were performed to evaluate the lower extremity deep venous systems from the level of the common femoral vein and including the common femoral, femoral, profunda femoral, popliteal and calf veins including the posterior tibial, peroneal and gastrocnemius veins when visible. The superficial great saphenous vein was also interrogated. Spectral Doppler was utilized to evaluate flow at rest and with distal augmentation maneuvers in the common femoral, femoral and popliteal veins. COMPARISON:  None. FINDINGS: RIGHT LOWER EXTREMITY Common Femoral Vein: No evidence of thrombus. Normal compressibility, respiratory phasicity and response to augmentation. Saphenofemoral Junction: No evidence of thrombus. Normal compressibility and flow on color Doppler imaging. Profunda Femoral Vein: No evidence of thrombus. Normal compressibility and flow on color Doppler imaging. Femoral Vein: No evidence of thrombus. Normal compressibility, respiratory phasicity and response to augmentation. Popliteal Vein: Not well visualized due to overlying edema. Calf Veins: Not well visualized due to overlying edema. Superficial Great Saphenous Vein: No evidence of thrombus. Normal compressibility. Venous Reflux:  None. Other Findings:  None. LEFT LOWER EXTREMITY Common Femoral Vein: No evidence of thrombus. Normal compressibility, respiratory phasicity and response to augmentation. Saphenofemoral Junction: No evidence of thrombus. Normal compressibility and flow on color Doppler imaging. Profunda Femoral Vein: No evidence of thrombus. Normal compressibility and flow on color Doppler imaging. Femoral Vein: No evidence of thrombus. Normal  compressibility, respiratory phasicity and response to augmentation. Popliteal Vein: Not well visualized due to overlying edema. Calf Veins: Not well visualized due to overlying edema. Superficial Great Saphenous Vein: No evidence of thrombus. Normal compressibility. Venous Reflux:  None. Other Findings:  None. IMPRESSION: Somewhat limited exam due to peripheral edema although no central deep venous thrombosis is noted. Electronically Signed   By: MInez CatalinaM.D.   On: 04/03/2020 15:58   ECHOCARDIOGRAM COMPLETE  Result Date: 04/04/2020    ECHOCARDIOGRAM REPORT   Patient Name:   MCHANIECE BARBATO  Date of Exam: 04/04/2020 Medical Rec #:  [id number]      Height:       64.0 in Accession #:    [id number]     Weight:       181.8 lb Date of Birth:  08/20/1945     BSA:          1.879 m Patient Age:    68 years       BP:           171/78 mmHg Patient Gender: F              HR:           98 bpm. Exam Location:  [facility] Procedure: 2D Echo, Color Doppler, Cardiac Doppler and Intracardiac            Opacification Agent Indications:     I50.31 CHF-Acute Diastolic  History:         Patient has no prior history of Echocardiogram examinations.                  COPD, Signs/Symptoms:Shortness of Breath; Risk Factors:Diabetes                  and Current Smoker.  Sonographer:     Charmayne Sheer RDCS (AE) Referring Phys:  Baker Janus Soledad Gerlach NIU Diagnosing Phys: Bartholome Bill MD  Sonographer Comments: Technically difficult study due to poor echo windows. Image acquisition challenging due to patient body habitus and Image acquisition challenging due to COPD. IMPRESSIONS  1. Left ventricular ejection fraction, by estimation, is 55 to 60%. The left ventricle has normal function. The left ventricle has no regional wall motion abnormalities. Left ventricular diastolic parameters are consistent with Grade I diastolic dysfunction (impaired relaxation).  2. Right ventricular systolic function is normal. The right ventricular size is mildly enlarged.  3. Left  atrial size was mildly dilated.  4. Right atrial size was mildly dilated.  5. The mitral valve was not well visualized. Trivial mitral valve regurgitation.  6. The aortic valve was not well visualized. Aortic valve regurgitation is trivial. FINDINGS  Left Ventricle: Left ventricular ejection fraction, by estimation, is 55 to 60%. The left ventricle has normal function. The left ventricle has no regional wall motion abnormalities. Definity contrast agent was given IV to delineate the left ventricular  endocardial borders. The left ventricular internal cavity size was normal in size. There is borderline left ventricular hypertrophy. Left ventricular diastolic parameters are consistent with Grade I diastolic dysfunction (impaired relaxation). Right Ventricle: The right ventricular size is mildly enlarged. No increase in right ventricular wall thickness. Right ventricular systolic function is normal. Left Atrium: Left atrial size was mildly dilated. Right Atrium: Right atrial size was mildly dilated. Pericardium: There is no evidence of pericardial effusion. Mitral Valve: The mitral valve was not well visualized. Trivial mitral valve regurgitation. MV peak gradient, 7.4 mmHg. The mean mitral valve gradient is 3.0 mmHg. Tricuspid Valve: The tricuspid valve is not well visualized. Tricuspid valve regurgitation is trivial. Aortic Valve: The aortic valve was not well visualized. Aortic valve regurgitation is trivial. Aortic valve mean gradient measures 3.0 mmHg. Aortic valve peak gradient measures 6.8 mmHg. Aortic valve area, by VTI measures 2.46 cm. Pulmonic Valve: The pulmonic valve was not well visualized. Pulmonic valve regurgitation is not visualized. Aorta: The aortic root was not well visualized. IAS/Shunts: The interatrial septum was not assessed.  LEFT VENTRICLE PLAX 2D LVIDd:         4.15 cm  Diastology LVIDs:         3.64 cm     LV e' medial:    5.98 cm/s LV PW:         1.43 cm     LV E/e' medial:  18.3 LV  IVS:        1.12 cm     LV e' lateral:   6.53 cm/s LVOT diam:     2.00 cm     LV E/e' lateral: 16.8 LV SV:         55 LV SV Index:   29 LVOT Area:     3.14 cm  LV Volumes (MOD) LV vol d, MOD A2C: 55.5 ml LV vol d, MOD A4C: 63.8 ml LV vol s, MOD A2C: 34.3 ml LV vol s, MOD A4C: 23.4 ml LV SV MOD A2C:     21.2 ml LV SV MOD A4C:     63.8 ml LV SV MOD BP:      34.2 ml RIGHT VENTRICLE RV Basal diam:  3.76 cm LEFT ATRIUM             Index       RIGHT ATRIUM           Index LA diam:        4.90 cm 2.61 cm/m  RA Area:     19.80 cm LA Vol (A2C):   59.5 ml 31.67 ml/m RA Volume:   61.10 ml  32.52 ml/m LA Vol (A4C):   57.5 ml 30.61 ml/m LA Biplane Vol: 61.2 ml 32.58 ml/m  AORTIC VALVE                   PULMONIC VALVE AV Area (Vmax):    2.51 cm    PV Vmax:       0.76 m/s AV Area (Vmean):   2.43 cm    PV Vmean:      53.200 cm/s AV Area (VTI):     2.46 cm    PV VTI:        0.126 m AV Vmax:           130.00 cm/s PV Peak grad:  2.3 mmHg AV Vmean:          83.500 cm/s PV Mean grad:  1.0 mmHg AV VTI:            0.225 m AV Peak Grad:      6.8 mmHg AV Mean Grad:      3.0 mmHg LVOT Vmax:         104.00 cm/s LVOT Vmean:        64.700 cm/s LVOT VTI:          0.176 m LVOT/AV VTI ratio: 0.78  AORTA Ao Root diam: 3.10 cm MITRAL VALVE                TRICUSPID VALVE MV Area (PHT): 3.00 cm     TR Peak grad:   28.9 mmHg MV Peak grad:  7.4 mmHg     TR Vmax:        269.00 cm/s MV Mean grad:  3.0 mmHg MV Vmax:       1.36 m/s     SHUNTS MV Vmean:      86.9 cm/s    Systemic VTI:  0.18 m MV Decel Time: 253 msec     Systemic Diam: 2.00 cm MV E velocity: 109.50 cm/s MV A velocity: 137.50 cm/s MV E/A ratio:  0.80 Chrissie Noa  Fath MD Electronically signed by Bartholome Bill MD Signature Date/Time: 04/04/2020/4:54:02 PM    Final    US Abdomen Limited RUQ (LIVER/GB)  Result Date: 04/11/2020 CLINICAL DATA:  Hyperbilirubinemia in a 74 year old female EXAM: ULTRASOUND ABDOMEN LIMITED RIGHT UPPER QUADRANT COMPARISON:  April 07, 2020 CT angiography of  the chest and CT of the chest of April 06, 2020 FINDINGS: Gallbladder: Wall thickness at upper limits of normal. No visible pericholecystic fluid or evidence of cholelithiasis. No reported tenderness over the gallbladder. Common bile duct: Diameter: 2.2 mm Liver: Mildly heterogeneous echotexture with nodular contour and suggestion of fissural widening particularly on image 22. No visible lesion on submitted images. Portal vein is patent on color Doppler imaging with normal direction of blood flow towards the liver. Other: Trace ascites IMPRESSION: 1. Heterogeneous, mildly heterogeneous hepatic echotexture with question of lobular contour and fissural widening, findings could be seen in the setting of early liver disease. 2. Gallbladder wall thickness at upper limits of normal could be seen in the setting of liver disease and is nonspecific, not associated with biliary calculi, reported tenderness or biliary duct dilation. Electronically Signed   By: Zetta Bills M.D.   On: 04/11/2020 12:23      ASSESSMENT/PLAN   Acute on chronic hypoxemic respiratory failure -Due to acute exacerbation of CHF as well as right lower lobe pneumonia with pulmonary edema -BNP is severely elevated over 2500  -Patient presented with mild leukocytosis with left shift suggestive of possible infectious etiology -Nephrology on case appreciate input patient is currently on Lasix drip-04/09/20- reviewed care plan with nephrology team Dr Juleen China -dcd lasix and started IVF -Agree with vancomycin and Zosyn-we will obtain a procalcitonin trend to guide de-escalation -Would recommend to DC vancomycin since MRSA PCR is negative -Solu-Medrol 20 mg twice daily due to COPD with exacerbation -ABG-metablic alk with resp compensation acute -due to most likely volume contracted state -Procalcitonin trend -transfer to step down   Altered mental status with confusion-imporoved  -Patient on gabapentin, fentanyl, Ativan, Zanaflex,  Zoloft, Requip, oxycodone -Daughter is present during my examination and reports that patient does not take any of these medications on a regular basis at home, specifically she states that Zoloft is taken as needed when patient feels sad, and gabapentin is only taken seldomly.  Additionally she mentioned that when patient does take gabapentin she generally goes into deep sleep from 1 pill. -Meds have been adjusted and mentation is improved -will obtain ABG to evaluate hypercanic encephalopathy -continue on BIPAP       Thank you for allowing me to participate in the care of this patient.    Patient/Family are satisfied with care plan and all questions have been answered.   This document was prepared using Dragon voice recognition software and may include unintentional dictation errors.     Ottie Glazier, M.D.  Division of [place]

## 2020-04-11 NOTE — Progress Notes (Signed)
[facility]  ROUNDING NOTE   Subjective:   Off BIPAP. Able to answer simple questions this morning.   UOP 2572mL.  D5W at 49mL/hr  Objective:  Vital signs in last 24 hours:  Temp:  [97 F (36.1 C)-98.8 F (37.1 C)] 97.8 F (36.6 C) (12/13 0700) Pulse Rate:  [59-169] 59 (12/13 1046) Resp:  [15-25] 20 (12/13 1046) BP: (125-190)/(45-126) 164/84 (12/13 1046) SpO2:  [83 %-100 %] 99 % (12/13 1046) FiO2 (%):  [50 %-60 %] 60 % (12/12 2002)  Weight change:  Filed Weights   04/08/20 0351 04/09/20 0406 04/09/20 1034  Weight: 72.3 kg 69.2 kg 66.9 kg    Intake/Output: I/O last 3 completed shifts: In: 1588.9 [I.V.:563.5; IV Piggyback:1025.4] Out: 2507 [Urine:2507]   Intake/Output this shift:  No intake/output data recorded.  Physical Exam: General:  Ill appearing  Head: /AT  Eyes:  Anicteric  Lungs:   bilateral crackles at bases  Heart:  regular  Abdomen:   Soft, nontender, nondistended  Extremities:  No peripheral edema  Neurologic:  Lethargic  Skin:  no tenting    Basic Metabolic Panel: Recent Labs  Lab 04/07/20 0416 04/08/20 0415 04/09/20 0438 04/10/20 0517 04/10/20 1444 04/11/20 0428  NA 138 139 143 149* 147* 147*  K 3.6 3.7 2.9* 2.4* 3.1* 3.3*  CL 88* 84* 87* 93* 94* 94*  CO2 36* 37* 39* 40* 38* 39*  GLUCOSE 90 158* 195* 195* 228* 220*  BUN 25* 25* 26* 39* 42* 40*  CREATININE 0.65 0.61 0.58 0.56 0.61 0.62  CALCIUM 8.7* 9.6 9.7 9.7 9.2 9.2  MG 2.1 1.8 2.3 2.4  --  2.2    Liver Function Tests: Recent Labs  Lab 04/09/20 0438 04/10/20 0517 04/10/20 1040 04/11/20 0428  AST 16 15  --  19  ALT 18 15  --  16  ALKPHOS 81 82  --  74  BILITOT 1.9* 1.8* 1.5* 2.1*  PROT 7.8 7.5  --  7.0  ALBUMIN 5.3* 4.9  --  4.4   Recent Labs  Lab 04/09/20 0438  LIPASE 27   Recent Labs  Lab 04/08/20 1002  AMMONIA 34    CBC: Recent Labs  Lab 04/07/20 0416 04/10/20 0517 04/11/20 0428  WBC 8.6 11.0* 13.9*  HGB 14.1 15.9* 15.3*  HCT 45.5  51.4* 51.1*  MCV 91.9 93.3 94.1  PLT 177 173 191    Cardiac Enzymes: No results for input(s): CKTOTAL, CKMB, CKMBINDEX, TROPONINI in the last 168 hours.  BNP: Invalid input(s): POCBNP  CBG: Recent Labs  Lab 04/10/20 1625 04/10/20 1640 04/10/20 2215 04/11/20 0749 04/11/20 1218  GLUCAP 204* 224* 241* 286* 235*    Microbiology: Results for orders placed or performed during the hospital encounter of 04/03/20  Resp Panel by RT-PCR (Flu A&B, Covid) Nasopharyngeal Swab     Status: None   Collection Time: 04/03/20 11:41 AM   Specimen: Nasopharyngeal Swab; Nasopharyngeal(NP) swabs in vial transport medium  Result Value Ref Range Status   SARS Coronavirus 2 by RT PCR NEGATIVE NEGATIVE Final    Comment: (NOTE) SARS-CoV-2 target nucleic acids are NOT DETECTED.  The SARS-CoV-2 RNA is generally detectable in upper respiratory specimens during the acute phase of infection. The lowest concentration of SARS-CoV-2 viral copies this assay can detect is 138 copies/mL. A negative result does not preclude SARS-Cov-2 infection and should not be used as the sole basis for treatment or other patient management decisions. A negative result may occur with  improper specimen collection/handling, submission  of specimen other than nasopharyngeal swab, presence of viral mutation(s) within the areas targeted by this assay, and inadequate number of viral copies(<138 copies/mL). A negative result must be combined with clinical observations, patient history, and epidemiological information. The expected result is Negative.  Fact Sheet for Patients:  EntrepreneurPulse.com.au  Fact Sheet for Healthcare Providers:  IncredibleEmployment.be  This test is no t yet approved or cleared by the [place] FDA and  has been authorized for detection and/or diagnosis of SARS-CoV-2 by FDA under an Emergency Use Authorization (EUA). This EUA will remain  in effect (meaning  this test can be used) for the duration of the COVID-19 declaration under Section 564(b)(1) of the Act, 21 U.S.C.section 360bbb-3(b)(1), unless the authorization is terminated  or revoked sooner.       Influenza A by PCR NEGATIVE NEGATIVE Final   Influenza B by PCR NEGATIVE NEGATIVE Final    Comment: (NOTE) The Xpert Xpress SARS-CoV-2/FLU/RSV plus assay is intended as an aid in the diagnosis of influenza from Nasopharyngeal swab specimens and should not be used as a sole basis for treatment. Nasal washings and aspirates are unacceptable for Xpert Xpress SARS-CoV-2/FLU/RSV testing.  Fact Sheet for Patients: EntrepreneurPulse.com.au  Fact Sheet for Healthcare Providers: IncredibleEmployment.be  This test is not yet approved or cleared by the [place] FDA and has been authorized for detection and/or diagnosis of SARS-CoV-2 by FDA under an Emergency Use Authorization (EUA). This EUA will remain in effect (meaning this test can be used) for the duration of the COVID-19 declaration under Section 564(b)(1) of the Act, 21 U.S.C. section 360bbb-3(b)(1), unless the authorization is terminated or revoked.  Performed at [facility], [address] [place], [place] [address]   Culture, blood (Routine X 2) w Reflex to ID Panel     Status: None   Collection Time: 04/03/20  3:27 PM   Specimen: BLOOD  Result Value Ref Range Status   Specimen Description BLOOD BLOOD RIGHT ARM  Final   Special Requests   Final    BOTTLES DRAWN AEROBIC AND ANAEROBIC Blood Culture adequate volume   Culture   Final    NO GROWTH 5 DAYS Performed at [facility], [address] [place], [place] [address]    Report Status 04/08/2020 FINAL  Final  Urine Culture     Status: Abnormal   Collection Time: 04/03/20  4:00 PM   Specimen: Urine, Random  Result Value Ref Range Status   Specimen Description   Final    URINE, RANDOM Performed at [facility], [address], [place], [place] [address]    Special Requests   Final    NONE Performed at [facility], [address], [place], [place] [address]    Culture (A)  Final    >=100,000 COLONIES/mL ESCHERICHIA COLI >=100,000 COLONIES/mL AEROCOCCUS URINAE Standardized susceptibility testing for this organism is not available. Performed at [facility], [place] [address] [place],  [address]    Report Status 04/07/2020 FINAL  Final   Organism ID, Bacteria ESCHERICHIA COLI (A)  Final      Susceptibility   Escherichia coli - MIC*    AMPICILLIN 4 SENSITIVE Sensitive     CEFAZOLIN <=4 SENSITIVE Sensitive     CEFEPIME <=0.12 SENSITIVE Sensitive     CEFTRIAXONE <=0.25 SENSITIVE Sensitive     CIPROFLOXACIN <=0.25 SENSITIVE Sensitive     GENTAMICIN <=1 SENSITIVE Sensitive     IMIPENEM <=0.25 SENSITIVE Sensitive  NITROFURANTOIN 32 SENSITIVE Sensitive     TRIMETH/SULFA <=20 SENSITIVE Sensitive     AMPICILLIN/SULBACTAM <=2 SENSITIVE Sensitive     PIP/TAZO <=4 SENSITIVE Sensitive     * >=100,000 COLONIES/mL ESCHERICHIA COLI  Culture, blood (Routine X 2) w Reflex to ID Panel     Status: None   Collection Time: 04/03/20 10:36 PM   Specimen: BLOOD  Result Value Ref Range Status   Specimen Description BLOOD BLOOD LEFT HAND  Final   Special Requests   Final    BOTTLES DRAWN AEROBIC AND ANAEROBIC Blood Culture adequate volume   Culture   Final    NO GROWTH 5 DAYS Performed at [facility], [address] [place], [place] [address]    Report Status 04/08/2020 FINAL  Final  Urine Culture     Status: None   Collection Time: 04/08/20  9:52 AM   Specimen: Urine, Random  Result Value Ref Range Status   Specimen Description   Final    URINE, RANDOM Performed at [facility], [address], [place], [place] [address]    Special Requests   Final    NONE Performed at [facility], [address], [place], [place] [address]     Culture   Final    NO GROWTH Performed at [facility], [place] [address], [place], [place] [address]    Report Status 04/09/2020 FINAL  Final  CULTURE, BLOOD (ROUTINE X 2) w Reflex to ID Panel     Status: None (Preliminary result)   Collection Time: 04/08/20 10:02 AM   Specimen: BLOOD  Result Value Ref Range Status   Specimen Description BLOOD BLOOD RIGHT HAND  Final   Special Requests   Final    BOTTLES DRAWN AEROBIC AND ANAEROBIC Blood Culture results may not be optimal due to an inadequate volume of blood received in culture bottles   Culture   Final    NO GROWTH 3 DAYS Performed at [facility] [address] [place], [place] [address]    Report Status PENDING  Incomplete  MRSA PCR Screening     Status: None   Collection Time: 04/08/20 10:04 AM   Specimen: Nasal Mucosa; Nasopharyngeal  Result Value Ref Range Status   MRSA by PCR NEGATIVE NEGATIVE Final    Comment:        The GeneXpert MRSA Assay (FDA approved for NASAL specimens only), is one component of a comprehensive MRSA colonization surveillance program. It is not intended to diagnose MRSA infection nor to guide or monitor treatment for MRSA infections. Performed at [facility], [address] [place], [place] [address]   CULTURE, BLOOD (ROUTINE X 2) w Reflex to ID Panel     Status: None (Preliminary result)   Collection Time: 04/08/20 10:26 AM   Specimen: BLOOD  Result Value Ref Range Status   Specimen Description BLOOD BLOOD RIGHT HAND  Final   Special Requests   Final    BOTTLES DRAWN AEROBIC AND ANAEROBIC Blood Culture results may not be optimal due to an inadequate volume of blood received in culture bottles   Culture   Final    NO GROWTH 3 DAYS Performed at [facility], [address], [place], [place] [address]    Report Status PENDING  Incomplete    Coagulation Studies: Recent Labs    04/10/20 1040  LABPROT 14.2  INR 1.1    Urinalysis: Recent Labs     04/10/20 [place]  LABSPEC 1.017  PHURINE 6.0  GLUCOSEU 50*  HGBUR MODERATE*  BILIRUBINUR NEGATIVE  KETONESUR NEGATIVE  PROTEINUR >=300*  NITRITE NEGATIVE  LEUKOCYTESUR TRACE*      Imaging: US Abdomen Limited RUQ (LIVER/GB)  Result Date: 04/11/2020 CLINICAL DATA:  Hyperbilirubinemia in a 74 year old female EXAM: ULTRASOUND ABDOMEN LIMITED RIGHT UPPER QUADRANT COMPARISON:  April 07, 2020 CT angiography of the chest and CT of the chest of April 06, 2020 FINDINGS: Gallbladder: Wall thickness at upper limits of normal. No visible pericholecystic fluid or evidence of cholelithiasis. No reported tenderness over the gallbladder. Common bile duct: Diameter: 2.2 mm Liver: Mildly heterogeneous echotexture with nodular contour and suggestion of fissural widening particularly on image 22. No visible lesion on submitted images. Portal vein is patent on color Doppler imaging with normal direction of blood flow towards the liver. Other: Trace ascites IMPRESSION: 1. Heterogeneous, mildly heterogeneous hepatic echotexture with question of lobular contour and fissural widening, findings could be seen in the setting of early liver disease. 2. Gallbladder wall thickness at upper limits of normal could be seen in the setting of liver disease and is nonspecific, not associated with biliary calculi, reported tenderness or biliary duct dilation. Electronically Signed   By: Zetta Bills M.D.   On: 04/11/2020 12:23     Medications:   . sodium chloride Stopped (04/10/20 1451)  . dextrose 50 mL/hr at 04/10/20 2224  . piperacillin-tazobactam (ZOSYN)  IV 3.375 g (04/11/20 0526)  . potassium chloride 10 mEq (04/11/20 1224)   . amLODipine  10 mg Oral Daily  . vitamin C  1,000 mg Oral Daily  . Chlorhexidine Gluconate Cloth  6 each Topical Daily  . fluticasone  1 spray Each Nare BID  . insulin aspart  0-5 Units Subcutaneous QHS  . insulin aspart  0-9 Units Subcutaneous TID WC  . insulin  detemir  10 Units Subcutaneous Daily  . ipratropium-albuterol  3 mL Nebulization TID  . levothyroxine  150 mcg Oral Q0600  . [START ON 04/12/2020] losartan  100 mg Oral Daily  . methylPREDNISolone (SOLU-MEDROL) injection  20 mg Intravenous Q12H  . metoprolol tartrate  25 mg Oral BID  . nicotine  21 mg Transdermal Daily  . potassium chloride  40 mEq Oral BID  . rosuvastatin  5 mg Oral Daily  . sodium chloride flush  3 mL Intravenous Q12H  . vitamin B-12  1,000 mcg Oral Daily   sodium chloride, acetaminophen, acetaminophen, albuterol, dextromethorphan-guaiFENesin, fluticasone, labetalol, nitroGLYCERIN, ondansetron (ZOFRAN) IV, sodium chloride flush  Assessment/ Plan:  74 y.o. female  Martha [name] is a 74 y.o. 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  #Proteinuria, 17 g.   # Nephrotic syndrome.   - will need renal biopsy in future - currently hypovolemic on examination - Continue D5W infusion - Hold diuretics, encourage PO intake  #Hypokalemia - secondary to loop diuretics.  - replace with potassium chloride.   #Metabolic alkalosis with compensated respiratory acidosis and with metabolic acidosis  #Acute hypoxic respiratory failure  With right sided consolidation. Empiric antibiotics and systemic steroids.  Noninvasive ventilation.   #Hypertension Elevated. On losartan, metoprolol, and amlodipine  #Diabetes mellitus type 2 Lab Results  Component Value Date   HGBA1C 6.5 (H) 04/03/2020   Continue glucose control.    LOS: 8 Martha [name] 12/13/202112:29 PM

## 2020-04-11 NOTE — Consult Note (Signed)
[facility] Nurse wound follow up Wound type: venous stasis with Unna's boots for compression. Change M/Thursday Measurement: 1. Right medial malleolar; 2cm x 1cmx 0.1cm scattered area of partial thickness skin loss 2. Right posterior distal calf: 4cm x 3cm x 0.2cm area of partial thickness skin loss patchy 3. Left posterior achilles area: 2cm x 3cm x 0.1cm  4. Left medial calf: 6cm x 4cm x 0.1cm  Wound bed: all wounds are clean, bleed EASILY Drainage (amount, consistency, odor) bloody; serosanguinous strike though  Periwound: intact  Dressing procedure/placement/frequency: Removed old Unna's boots; cleansed both legs Old skin mechanically debrided on the LLE malleolar areas with wash cloth Apply calcium alginate to all open wounds for exudate management and hemostasis of actively oozing wounds. Reapply Unna's boots bilaterally.  Patient tolerated well.  Plans for topical wound care and Unna's boot changes M/Thurs while inpatient per [facility] nursing team  Will need topical care and compression therapy at DC as outpatient.   [place], [place], [place]

## 2020-04-12 ENCOUNTER — Inpatient Hospital Stay: Payer: Medicare Other

## 2020-04-12 DIAGNOSIS — G9341 Metabolic encephalopathy: Secondary | ICD-10-CM

## 2020-04-12 DIAGNOSIS — J441 Chronic obstructive pulmonary disease with (acute) exacerbation: Secondary | ICD-10-CM

## 2020-04-12 DIAGNOSIS — J9601 Acute respiratory failure with hypoxia: Secondary | ICD-10-CM

## 2020-04-12 DIAGNOSIS — I509 Heart failure, unspecified: Secondary | ICD-10-CM

## 2020-04-12 LAB — COMPREHENSIVE METABOLIC PANEL
ALT: 29 U/L (ref 0–44)
AST: 25 U/L (ref 15–41)
Albumin: 4.1 g/dL (ref 3.5–5.0)
Alkaline Phosphatase: 84 U/L (ref 38–126)
Anion gap: 14 (ref 5–15)
BUN: 37 mg/dL — ABNORMAL HIGH (ref 8–23)
CO2: 44 mmol/L — ABNORMAL HIGH (ref 22–32)
Calcium: 9.3 mg/dL (ref 8.9–10.3)
Chloride: 84 mmol/L — ABNORMAL LOW (ref 98–111)
Creatinine, Ser: 0.55 mg/dL (ref 0.44–1.00)
GFR, Estimated: >60 mL/min (ref >60)
Glucose, Bld: 328 mg/dL — ABNORMAL HIGH (ref 70–99)
Potassium: 3.1 mmol/L — ABNORMAL LOW (ref 3.5–5.1)
Sodium: 142 mmol/L (ref 135–145)
Total Bilirubin: 2.2 mg/dL — ABNORMAL HIGH (ref 0.3–1.2)
Total Protein: 6.6 g/dL (ref 6.5–8.1)

## 2020-04-12 LAB — CBC
HCT: 51.5 % — ABNORMAL HIGH (ref 36.0–46.0)
Hemoglobin: 15.7 g/dL — ABNORMAL HIGH (ref 12.0–15.0)
MCH: 28 pg (ref 26.0–34.0)
MCHC: 30.5 g/dL (ref 30.0–36.0)
MCV: 91.8 fL (ref 80.0–100.0)
Platelets: 179 10*3/uL (ref 150–400)
RBC: 5.61 MIL/uL — ABNORMAL HIGH (ref 3.87–5.11)
RDW: 16.9 % — ABNORMAL HIGH (ref 11.5–15.5)
WBC: 16.2 10*3/uL — ABNORMAL HIGH (ref 4.0–10.5)
nRBC: 0 % (ref 0.0–0.2)

## 2020-04-12 LAB — GLUCOSE, CAPILLARY
Glucose-Capillary: 312 mg/dL — ABNORMAL HIGH (ref 70–99)
Glucose-Capillary: 303 mg/dL — ABNORMAL HIGH (ref 70–99)
Glucose-Capillary: 175 mg/dL — ABNORMAL HIGH (ref 70–99)
Glucose-Capillary: 200 mg/dL — ABNORMAL HIGH (ref 70–99)
Glucose-Capillary: 207 mg/dL — ABNORMAL HIGH (ref 70–99)

## 2020-04-12 LAB — MAGNESIUM: Magnesium: 2.2 mg/dL (ref 1.7–2.4)

## 2020-04-12 LAB — MRSA PCR SCREENING: MRSA by PCR: NEGATIVE

## 2020-04-12 LAB — PROCALCITONIN: Procalcitonin: <0.1 ng/mL

## 2020-04-12 LAB — HAPTOGLOBIN: Haptoglobin: 233 mg/dL (ref 42–346)

## 2020-04-12 MED ORDER — IOHEXOL 300 MG/ML  SOLN
100.0000 mL | Freq: Once | INTRAMUSCULAR | Status: AC | PRN
  Administered 2020-04-12: 13:00:00 100 mL via INTRAVENOUS

## 2020-04-12 MED ORDER — CHLORHEXIDINE GLUCONATE 0.12 % MT SOLN
15.0000 mL | Freq: Two times a day (BID) | OROMUCOSAL | Status: DC
  Administered 2020-04-12 – 2020-04-20 (×16): 15 mL via OROMUCOSAL
  Filled 2020-04-12 (×15): qty 15

## 2020-04-12 MED ORDER — POTASSIUM CHLORIDE 10 MEQ/100ML IV SOLN
10.0000 meq | INTRAVENOUS | Status: AC
Start: 2020-04-12 — End: 2020-04-12
  Administered 2020-04-12 (×4): 10 meq via INTRAVENOUS
  Filled 2020-04-12 (×4): qty 100

## 2020-04-12 MED ORDER — ORAL CARE MOUTH RINSE
15.0000 mL | Freq: Two times a day (BID) | OROMUCOSAL | Status: DC
  Administered 2020-04-12 – 2020-04-20 (×15): 15 mL via OROMUCOSAL

## 2020-04-12 MED ORDER — PANTOPRAZOLE SODIUM 40 MG IV SOLR
40.0000 mg | Freq: Two times a day (BID) | INTRAVENOUS | Status: DC
  Administered 2020-04-12: 15:00:00 40 mg via INTRAVENOUS
  Filled 2020-04-12: qty 40

## 2020-04-12 MED ORDER — SODIUM CHLORIDE 0.9 % IV SOLN
1.5000 g | Freq: Four times a day (QID) | INTRAVENOUS | Status: DC
  Administered 2020-04-12 – 2020-04-15 (×12): 1.5 g via INTRAVENOUS
  Filled 2020-04-12 (×2): qty 4
  Filled 2020-04-12 (×4): qty 1.5
  Filled 2020-04-12: qty 4
  Filled 2020-04-12: qty 1.5
  Filled 2020-04-12: qty 4
  Filled 2020-04-12: qty 1.5
  Filled 2020-04-12: qty 4
  Filled 2020-04-12: qty 1.5
  Filled 2020-04-12: qty 4

## 2020-04-12 MED ORDER — INSULIN DETEMIR 100 UNIT/ML ~~LOC~~ SOLN
10.0000 [IU] | Freq: Once | SUBCUTANEOUS | Status: AC
  Administered 2020-04-12: 12:00:00 10 [IU] via SUBCUTANEOUS
  Filled 2020-04-12: qty 0.1

## 2020-04-12 MED ORDER — VANCOMYCIN HCL 1500 MG/300ML IV SOLN
1500.0000 mg | Freq: Once | INTRAVENOUS | Status: DC
  Administered 2020-04-12: 17:00:00 1500 mg via INTRAVENOUS
  Filled 2020-04-12 (×2): qty 300

## 2020-04-12 MED ORDER — INSULIN GLARGINE 100 UNIT/ML ~~LOC~~ SOLN
20.0000 [IU] | Freq: Every day | SUBCUTANEOUS | Status: DC
  Administered 2020-04-13 – 2020-04-20 (×7): 20 [IU] via SUBCUTANEOUS
  Filled 2020-04-12 (×9): qty 0.2

## 2020-04-12 MED ORDER — VANCOMYCIN HCL 500 MG/100ML IV SOLN
500.0000 mg | Freq: Two times a day (BID) | INTRAVENOUS | Status: DC
  Filled 2020-04-12: qty 100

## 2020-04-12 MED ORDER — METOPROLOL TARTRATE 50 MG PO TABS
50.0000 mg | ORAL_TABLET | Freq: Two times a day (BID) | ORAL | Status: DC
  Administered 2020-04-12 – 2020-04-15 (×7): 50 mg via ORAL
  Filled 2020-04-12 (×8): qty 1

## 2020-04-12 NOTE — Progress Notes (Addendum)
Pulmonary and Critical Care  Medicine          Date: 04/12/2020,   MRN# [id number] Martha [name] [birth year]     AdmissionWeight: 72 kg                 CurrentWeight: 66.9 kg  Referring physician: Dr. Si Raider    CHIEF COMPLAINT:   Acute on chronic hypoxemic respiratory failure   HISTORY OF PRESENT ILLNESS   74 year old female with history of diabetes COPD lifelong smoking hypothyroidism gout major depressive disorder, anxiety disorder and GERD, diverticulitis who came in with signs and symptoms of anasarca including peripheral edema and pulmonary edema.  Patient states is been worse than usual despite compliance with wrapping the lower extremities.  She also reported coughing and dyspnea.  Upon presentation to the emergency room she was found to be desaturating in the mid 80s and was placed on 4 L supplemental oxygen with some subsequent improvement of SPO2.  She was then treated with BiPAP and felt better.  She was initially admitted with acute exacerbation of CHF and cardiology was consulted for this.  She was also found to have proteinuria with CKD and had renal consultation placed. Pulmonary consultation placed for acute on chronic hypoxemic respiratory failure with failure to wean down oxygen.  Patient did have CT PE protocol done to rule out acute pulmonary venous embolism which was negative however did show bullous emphysema with overlying pulmonary edema as well as a right lower lobe consolidated infiltrate suggestive of pneumonia as well as right hemidiaphragm elevation due to hepatomegaly.  04/09/20- patient seen at bedside, remains encephalopathic.  Discussed with [facility] attending Dr Si Raider plan for transfer to SDU.     04/10/20- patient is improved.  She is more awake, she was able to speak with me despite BIPAP mask, she has normal 4/4 grip bilaterally and moves LE to verbal communication without encouragment. Mentation has significantly improved. She is flushed red on  examination.    04/11/20- patient is improved.  She is off BIPAP during my evaluation.  She is able to speak and reported right upper extermity pain, this may be related to K infusion. I dicussed this with RN who has already addressed this with IV team. ABG is in progress. Cardiology and nephrology following, UOP is low.   04/12/20- patient had loose stools. ID consult in process.   Mentation is improved  04/13/20- patient is improved.  She is speaking in full sentences. I spoke to her common law husband to review hospital course and care plan.  Patient will need BIPAP for home, ive discussed case with [facility] team and qualification for NIV is in process.  Spirometry is in process. 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  She smokes actively , I have provided smoking cessation counseling today and will be following up on outpatient for cessation therapy.  LE swelling is resolved and sensorium is close to baseline. There is no diarreah   PAST MEDICAL HISTORY   Past Medical History:  Diagnosis Date  . Abnormal LFTs (liver function tests)   . Anxiety   . Arthritis    knees, feet  . Asthma   . Chronic bronchitis ([place])   . Chronic constipation   . Chronic low back pain   . Cigarette smoker    Has cut back Smoking to 1 pack every other day  . COPD (chronic obstructive pulmonary disease) ([place])   . Depression   . Diabetic neuropathy ([place])   .  Diverticulitis 2015   gi recommended repeat scope in 10 years  . Essential hypertension   . Fatty liver   . GERD (gastroesophageal reflux disease)   . Gout   . History of mammogram 05/28/2013  . Hyperlipidemia   . Hypothyroidism   . Insomnia   . Iron deficiency anemia   . Low serum vitamin D   . Personal history of tobacco use, presenting hazards to health 09/29/2015  . Plantar fasciitis   . RLS (restless legs syndrome)   . Shortness of breath dyspnea   . Thyroid cancer ([place]) 2009  . Tremors of nervous system    "I think I have  parkinson's disease"  . Type 2 diabetes mellitus ([place])   . Wears dentures    uppers     SURGICAL HISTORY   Past Surgical History:  Procedure Laterality Date  . CATARACT EXTRACTION W/PHACO Left 11/24/2015   Procedure: CATARACT EXTRACTION PHACO AND INTRAOCULAR LENS PLACEMENT (IOC);  Surgeon: Birder Robson, MD;  Location: [facility] ORS;  Service: Ophthalmology;  Laterality: Left;  [place] 38.3 AP% 20.6 CDE 7.89 FLUID PACK LOT # [id number]  . CATARACT EXTRACTION W/PHACO Right 06/23/2019   Procedure: CATARACT EXTRACTION PHACO AND INTRAOCULAR LENS PLACEMENT ([place]) RIGHT DIABETIC;  Surgeon: Birder Robson, MD;  Location: [place];  Service: Ophthalmology;  Laterality: Right;  Diabetic  . COLONOSCOPY  2004, 2009, 2015   [place]  . POLYPECTOMY  2009  . THYROIDECTOMY  2009   states she was tx with radiactive iodine  . TUBAL LIGATION    . UPPER GASTROINTESTINAL ENDOSCOPY       FAMILY HISTORY   Family History  Problem Relation Age of Onset  . Diabetes Mellitus II Father   . Lung disease Father   . Diabetes Mellitus II Mother   . Arthritis Mother   . Thyroid disease Other   . Asthma Sister   . Emphysema Sister   . Hypertension Sister   . Breast cancer Paternal Aunt      SOCIAL HISTORY   Social History   Tobacco Use  . Smoking status: Current Every Day Smoker    Packs/day: 1.00    Years: 39.50    Pack years: 39.50    Types: Cigarettes  . Smokeless tobacco: Never Used  . Tobacco comment: curently smokes 12 cigerattes a day. I'm trying to cut back.  Vaping Use  . Vaping Use: Never used  Substance Use Topics  . Alcohol use: No    Alcohol/week: 0.0 standard drinks  . Drug use: No     MEDICATIONS    Home Medication:    Current Medication:  Current Facility-Administered Medications:  .  0.9 %  sodium chloride infusion, 250 mL, Intravenous, PRN, Ivor Costa, MD, Paused at 04/12/20 1247 .  acetaminophen (TYLENOL) suppository 650 mg, 650 mg, Rectal, Q6H PRN, Lang Snow, NP, 650 mg at 04/08/20 2234 .  acetaminophen (TYLENOL) tablet 650 mg, 650 mg, Oral, Q6H PRN, Lorella Nimrod, MD, 650 mg at 04/06/20 1638 .  albuterol (PROVENTIL) (2.5 MG/3ML) 0.083% nebulizer solution 2.5 mg, 2.5 mg, Nebulization, Q4H PRN, Ivor Costa, MD .  amLODipine (NORVASC) tablet 10 mg, 10 mg, Oral, Daily, Wouk, Ailene Rud, MD, 10 mg at 04/12/20 0946 .  ascorbic acid (VITAMIN C) tablet 1,000 mg, 1,000 mg, Oral, Daily, Ivor Costa, MD, 1,000 mg at 04/12/20 0942 .  Chlorhexidine Gluconate Cloth 2 % PADS 6 each, 6 each, Topical, Daily, Wouk, Ailene Rud, MD, 6 each at 04/12/20 0902 .  dextromethorphan-guaiFENesin (MUCINEX DM) 30-600 MG per 12 hr tablet 1 tablet, 1 tablet, Oral, BID PRN, Ivor Costa, MD, 1 tablet at 04/06/20 0945 .  fluticasone (FLONASE) 50 MCG/ACT nasal spray 1 spray, 1 spray, Each Nare, BID, Sharion Settler, NP, 1 spray at 04/12/20 0940 .  fluticasone (FLONASE) 50 MCG/ACT nasal spray 1-2 spray, 1-2 spray, Each Nare, Daily PRN, Ivor Costa, MD .  insulin aspart (novoLOG) injection 0-5 Units, 0-5 Units, Subcutaneous, QHS, Ivor Costa, MD, 3 Units at 04/11/20 2212 .  insulin aspart (novoLOG) injection 0-9 Units, 0-9 Units, Subcutaneous, TID WC, Ivor Costa, MD, 7 Units at 04/12/20 1146 .  [START ON 04/13/2020] insulin glargine (LANTUS) injection 20 Units, 20 Units, Subcutaneous, Daily, Wouk, Ailene Rud, MD .  ipratropium-albuterol (DUONEB) 0.5-2.5 (3) MG/3ML nebulizer solution 3 mL, 3 mL, Nebulization, TID, Ivor Costa, MD, 3 mL at 04/12/20 0834 .  labetalol (NORMODYNE) injection 20 mg, 20 mg, Intravenous, Q2H PRN, Wouk, Ailene Rud, MD, 20 mg at 04/12/20 0226 .  levothyroxine (SYNTHROID) tablet 150 mcg, 150 mcg, Oral, Q0600, Ivor Costa, MD, 150 mcg at 04/08/20 0616 .  losartan (COZAAR) tablet 100 mg, 100 mg, Oral, Daily, Wouk, Ailene Rud, MD, 100 mg at 04/12/20 0946 .  methylPREDNISolone sodium succinate (SOLU-MEDROL) 40 mg/mL injection 20 mg, 20 mg,  Intravenous, Q12H, Lanney Gins, Shatonya Passon, MD, 20 mg at 04/12/20 1408 .  metoprolol tartrate (LOPRESSOR) tablet 50 mg, 50 mg, Oral, BID, Paraschos, Alexander, MD, 50 mg at 04/12/20 0945 .  nitroGLYCERIN (NITROSTAT) SL tablet 0.4 mg, 0.4 mg, Sublingual, Q5 min PRN, Wouk, Ailene Rud, MD, 0.4 mg at 04/08/20 0659 .  ondansetron (ZOFRAN) injection 4 mg, 4 mg, Intravenous, Q8H PRN, Ivor Costa, MD, 4 mg at 04/12/20 0856 .  pantoprazole (PROTONIX) injection 40 mg, 40 mg, Intravenous, Q12H, Wouk, Ailene Rud, MD .  piperacillin-tazobactam (ZOSYN) IVPB 3.375 g, 3.375 g, Intravenous, Q8H, Wouk, Ailene Rud, MD, Stopped at 04/12/20 1253 .  potassium chloride 10 mEq in 100 mL IVPB, 10 mEq, Intravenous, Q1 Hr x 4, Wouk, Ailene Rud, MD, Last Rate: 100 mL/hr at 04/12/20 1316, 10 mEq at 04/12/20 1316 .  potassium chloride SA (KLOR-CON) CR tablet 40 mEq, 40 mEq, Oral, BID, Wouk, Ailene Rud, MD, 40 mEq at 04/12/20 0941 .  rosuvastatin (CRESTOR) tablet 5 mg, 5 mg, Oral, Daily, Ivor Costa, MD, 5 mg at 04/12/20 0945 .  sodium chloride flush (NS) 0.9 % injection 3 mL, 3 mL, Intravenous, Q12H, Ivor Costa, MD, 3 mL at 04/11/20 2153 .  sodium chloride flush (NS) 0.9 % injection 3 mL, 3 mL, Intravenous, PRN, Ivor Costa, MD .  vitamin B-12 (CYANOCOBALAMIN) tablet 1,000 mcg, 1,000 mcg, Oral, Daily, Ivor Costa, MD, 1,000 mcg at 04/12/20 0944    ALLERGIES   Nicotine polacrilex, Nicotrol [nicotine], and Codeine sulfate     REVIEW OF SYSTEMS    Review of Systems:  Unable to obtain due to encephalopathy   VS: BP (!) 181/71   Pulse 78   Temp 98 F (36.7 C) (Axillary)   Resp 19   Ht 5' 4" (1.626 m)   Wt 66.9 kg   SpO2 98%   BMI 25.32 kg/m      PHYSICAL EXAM    GENERAL:NAD, no fevers, chills, no weakness no fatigue HEAD: Normocephalic, atraumatic.  EYES: Pupils equal, round, reactive to light. Extraocular muscles intact. No scleral icterus.  MOUTH: dry mucosal membrane. Dentition intact. No abscess  noted. On BIPAP EAR, NOSE, THROAT: Clear without exudates. No external lesions.  NECK: Supple. No thyromegaly. No nodules. No JVD.  PULMONARY: mild rhonchi bilaterally  CARDIOVASCULAR: S1 and S2. Regular rate and rhythm. No murmurs, rubs, or gallops. No edema. Pedal pulses 2+ bilaterally.  GASTROINTESTINAL: Soft, nontender, nondistended. No masses. Positive bowel sounds. No hepatosplenomegaly.  MUSCULOSKELETAL: No swelling, clubbing, +edema 2++.  NEUROLOGIC: GCS 10  SKIN: No ulceration, lesions, rashes, or cyanosis. Skin warm and dry. Turgor intact.       IMAGING    DG Chest 1 View  Result Date: 04/09/2020 CLINICAL DATA:  Acute respiratory failure EXAM: CHEST  1 VIEW COMPARISON:  04/03/2016 FINDINGS: Normal cardiac silhouette. Interval increase in interstitial edema pattern. No focal consolidation. No pneumothorax. IMPRESSION: Worsening interstitial edema. Electronically Signed   By: Suzy Bouchard M.D.   On: 04/09/2020 06:31   DG Chest 2 View  Result Date: 04/03/2020 CLINICAL DATA:  Hypoxia. Leg edema and shortness of breath. Pt daughter states that pt is supposed to be getting a renal bx but they are not able to do it because of the edema in pts legs. Pt states that she does feel short of breath. Pt does not wear oxygen at home. Pt placed on O2 in triage due to sats being in the 80s. Hx of asthma, chronic bronchitis, COPD, thyroid cancer- 2009, DM, current smoker. EXAM: CHEST - 2 VIEW COMPARISON:  01/04/2018 FINDINGS: Cardiac silhouette is borderline enlarged. Stable aortic atherosclerotic calcifications. No mediastinal or hilar masses. No evidence of adenopathy. Lungs demonstrate diffuse bilateral irregular interstitial thickening which has increased compared to the prior study. There is also linear/reticular scarring in the left lung base, which is stable. No lung consolidation. No pleural effusion and no pneumothorax. Skeletal structures are demineralized, but grossly intact.  IMPRESSION: 1. Bilateral irregular interstitial thickening, increased compared to the prior study, along with borderline cardiomegaly. Suspect mild congestive heart failure. Consider diffuse interstitial infection or inflammation in the proper clinical setting. No evidence of lobar pneumonia. Electronically Signed   By: Lajean Manes M.D.   On: 04/03/2020 13:06   CT CHEST W CONTRAST  Result Date: 04/06/2020 CLINICAL DATA:  Hemoptysis. EXAM: CT CHEST WITH CONTRAST TECHNIQUE: Multidetector CT imaging of the chest was performed during intravenous contrast administration. CONTRAST:  67m OMNIPAQUE IOHEXOL 300 MG/ML  SOLN COMPARISON:  January 06, 2019 FINDINGS: Cardiovascular: There is marked severity calcification of the aortic arch. There is mild cardiomegaly. No pericardial effusion. Mediastinum/Nodes: There is mild pretracheal and right hilar lymphadenopathy. Thyroid gland, trachea, and esophagus demonstrate no significant findings. Lungs/Pleura: Mild-to-moderate severity emphysematous lung disease is seen involving the bilateral upper lobes with stable bullous disease is seen along the medial aspect of the left apex. Mild to moderate severity areas of atelectasis and/or infiltrate are seen within the posterior aspects of the bilateral upper lobes and left lung base. Moderate to marked severity posterior right basilar atelectasis and/or infiltrate is noted. A very small right pleural effusion is seen. No pneumothorax is identified. Upper Abdomen: There is stable hepatosplenomegaly with stable, moderate severity diffuse bilateral adrenal gland enlargement. Musculoskeletal: A chronic lateral eighth left rib fracture is seen. A chronic compression fracture deformity of the T12 vertebral body is noted. IMPRESSION: 1. Mild-to-moderate severity posterior bilateral upper lobe and left basilar atelectasis and/or infiltrate. 2. Moderate to marked severity posterior right basilar atelectasis and/or infiltrate. 3. Very  small right pleural effusion. 4. Mild cardiomegaly. 5. Stable emphysematous lung disease. 6. Chronic lateral eighth left rib fracture. 7. Chronic compression fracture deformity of the T12 vertebral body. 8. Aortic  atherosclerosis. Aortic Atherosclerosis (ICD10-I70.0) and Emphysema (ICD10-J43.9). Electronically Signed   By: Virgina Norfolk M.D.   On: 04/06/2020 15:33   CT ANGIO CHEST PE W OR WO CONTRAST  Result Date: 04/07/2020 CLINICAL DATA:  Shortness of breath EXAM: CT ANGIOGRAPHY CHEST WITH CONTRAST TECHNIQUE: Multidetector CT imaging of the chest was performed using the standard protocol during bolus administration of intravenous contrast. Multiplanar CT image reconstructions and MIPs were obtained to evaluate the vascular anatomy. CONTRAST:  43m OMNIPAQUE IOHEXOL 350 MG/ML SOLN COMPARISON:  April 06, 2020 FINDINGS: Cardiovascular: There is a optimal opacification of the pulmonary arteries. There is no central,segmental, or subsegmental filling defects within the pulmonary arteries. There is unchanged cardiomegaly. Mitral valve calcifications and coronary artery calcifications are seen. No pericardial effusion or thickening. No evidence right heart strain. There is normal three-vessel brachiocephalic anatomy without proximal stenosis. Scattered aortic atherosclerosis is noted. Mediastinum/Nodes: No hilar, mediastinal, or axillary adenopathy. Thyroid gland, trachea, and esophagus demonstrate no significant findings. Lungs/Pleura: Patchy airspace opacity seen at the left upper lung and posterior right lung base with air bronchograms. There are trace bilateral pleural effusions present. Centrilobular and paraseptal emphysematous changes seen at both lung apices. Upper Abdomen: No acute abnormalities present in the visualized portions of the upper abdomen. Musculoskeletal: No chest wall abnormality. No acute or significant osseous findings. There is a chronic anterior wedge compression deformity of the T12  vertebral body with 50% loss in vertebral body height. Review of the MIP images confirms the above findings. IMPRESSION: No central, segmental, or subsegmental pulmonary embolism. Unchanged multifocal patchy airspace opacities within both lungs which could be due to multifocal pneumonia Trace bilateral pleural effusions, right greater than left. Aortic Atherosclerosis (ICD10-I70.0). Electronically Signed   By: BPrudencio PairM.D.   On: 04/07/2020 19:43   CT CHEST ABDOMEN PELVIS W CONTRAST  Result Date: 04/12/2020 CLINICAL DATA:  Shortness of breath, abdominal pain, and bilateral leg swelling. EXAM: CT CHEST, ABDOMEN, AND PELVIS WITH CONTRAST TECHNIQUE: Multidetector CT imaging of the chest, abdomen and pelvis was performed following the standard protocol during bolus administration of intravenous contrast. CONTRAST:  1049mOMNIPAQUE IOHEXOL 300 MG/ML  SOLN COMPARISON:  Right upper quadrant ultrasound from yesterday. CTA chest dated April 07, 2020. FINDINGS: CT CHEST FINDINGS Cardiovascular: Unchanged borderline cardiomegaly. No pericardial effusion. No thoracic aortic aneurysm or dissection. Coronary, aortic arch, and branch vessel atherosclerotic vascular disease. 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 No central pulmonary embolism. Mediastinum/Nodes: Unchanged mildly enlarged precarinal lymph node measuring 12 mm in short axis, stable since September 2020, likely reactive. No enlarged hilar or axillary lymph nodes. Prior thyroidectomy. Prominent circumferential wall thickening of the proximal and mid esophagus, slightly worsened since the prior study. Lungs/Pleura: Slightly increased small right pleural effusion. New trace left pleural effusion. Prominent atelectasis within the right lower lobe with patchy areas of non enhancement and air bronchograms consistent with underlying pneumonia, slightly worsened when  compared to prior study. Increased patchy consolidation in both posterior upper lobes. Minimal subsegmental atelectasis in the left lower lobe. Moderate centrilobular and mild paraseptal emphysema again noted with bullous changes in the medial left lung apex. No pneumothorax. 7 mm subpleural nodule in the left upper lobe (series 4, image 39), similar to most recent chest CTs, but slightly more conspicuous when compared to CT chest from September 2020. Musculoskeletal: No acute or significant osseous findings. Unchanged chronic T12 compression deformity. Old left eighth rib fracture. CT ABDOMEN PELVIS  FINDINGS Hepatobiliary: Slightly lobular liver contour. No focal liver abnormality is seen. No gallstones, gallbladder wall thickening, or biliary dilatation. Pancreas: Unremarkable. No pancreatic ductal dilatation or surrounding inflammatory changes. Spleen: Normal in size without focal abnormality. Adrenals/Urinary Tract: Chronic bilateral adrenal hypertrophy. No renal calculi, focal lesion, or hydronephrosis. The bladder is unremarkable. Stomach/Bowel: Stomach is within normal limits. Appendix appears normal. No evidence of bowel wall thickening, distention, or inflammatory changes. Prominent sigmoid colonic diverticulosis. Vascular/Lymphatic: Aortic atherosclerosis. 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 No enlarged abdominal or pelvic lymph nodes. Reproductive: Uterus and bilateral adnexa are unremarkable. Other: Scattered trace ascites. No pneumoperitoneum. Small fat containing paraumbilical hernias. Musculoskeletal: No acute or significant osseous findings. IMPRESSION: CT chest: 1. Mildly worsened multifocal pneumonia. 2. Slightly increased small right pleural effusion. New trace left pleural effusion. 3. 7 mm subpleural nodule in the left upper lobe, similar to most recent chest CTs, but slightly more conspicuous when compared to CT chest from September 2020. While this may be infectious or  inflammatory, attention on follow-up imaging is recommended. 4. Prominent circumferential wall thickening of the proximal and mid esophagus, slightly worsened since the prior study, concerning for esophagitis. 5. 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 6. Aortic Atherosclerosis (ICD10-I70.0) and Emphysema (ICD10-J43.9). CT abdomen and pelvis: 1. No acute intra-abdominal process. 2. Slightly lobular liver contour with scattered trace ascites, suggestive of cirrhosis. 3. 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 Electronically Signed   By: Titus Dubin M.D.   On: 04/12/2020 13:51   US Venous Img Lower Bilateral (DVT)  Result Date: 04/03/2020 CLINICAL DATA:  Lower leg cellulitis EXAM: BILATERAL LOWER EXTREMITY VENOUS DOPPLER ULTRASOUND TECHNIQUE: Gray-scale sonography with graded compression, as well as color Doppler and duplex ultrasound were performed to evaluate the lower extremity deep venous systems from the level of the common femoral vein and including the common femoral, femoral, profunda femoral, popliteal and calf veins including the posterior tibial, peroneal and gastrocnemius veins when visible. The superficial great saphenous vein was also interrogated. Spectral Doppler was utilized to evaluate flow at rest and with distal augmentation maneuvers in the common femoral, femoral and popliteal veins. COMPARISON:  None. FINDINGS: RIGHT LOWER EXTREMITY Common Femoral Vein: No evidence of thrombus. Normal compressibility, respiratory phasicity and response to augmentation. Saphenofemoral Junction: No evidence of thrombus. Normal compressibility and flow on color Doppler imaging. Profunda Femoral Vein: No evidence of thrombus. Normal compressibility and flow on color Doppler imaging. Femoral Vein: No evidence of thrombus. Normal compressibility, respiratory phasicity and response to augmentation.  Popliteal Vein: Not well visualized due to overlying edema. Calf Veins: Not well visualized due to overlying edema. Superficial Great Saphenous Vein: No evidence of thrombus. Normal compressibility. Venous Reflux:  None. Other Findings:  None. LEFT LOWER EXTREMITY Common Femoral Vein: No evidence of thrombus. Normal compressibility, respiratory phasicity and response to augmentation. Saphenofemoral Junction: No evidence of thrombus. Normal compressibility and flow on color Doppler imaging. Profunda Femoral Vein: No evidence of thrombus. Normal compressibility and flow on color Doppler imaging. Femoral Vein: No evidence of thrombus. Normal compressibility, respiratory phasicity and response to augmentation. Popliteal Vein: Not well visualized due to overlying edema. Calf Veins: Not well visualized due to overlying edema. Superficial Great Saphenous Vein: No evidence of thrombus. Normal compressibility. Venous Reflux:  None. Other Findings:  None. IMPRESSION: Somewhat limited exam due to peripheral edema although no central deep venous thrombosis is noted. Electronically Signed  By: Inez Catalina M.D.   On: 04/03/2020 15:58   ECHOCARDIOGRAM COMPLETE  Result Date: 04/04/2020    ECHOCARDIOGRAM REPORT   Patient Name:   NANCY ARVIN Date of Exam: 04/04/2020 Medical Rec #:  [id number]      Height:       64.0 in Accession #:    [id number]     Weight:       181.8 lb Date of Birth:  08-13-1945     BSA:          1.879 m Patient Age:    61 years       BP:           171/78 mmHg Patient Gender: F              HR:           98 bpm. Exam Location:  [facility] Procedure: 2D Echo, Color Doppler, Cardiac Doppler and Intracardiac            Opacification Agent Indications:     I50.31 CHF-Acute Diastolic  History:         Patient has no prior history of Echocardiogram examinations.                  COPD, Signs/Symptoms:Shortness of Breath; Risk Factors:Diabetes                  and Current Smoker.  Sonographer:     Charmayne Sheer RDCS (AE)  Referring Phys:  Baker Janus Soledad Gerlach NIU Diagnosing Phys: Bartholome Bill MD  Sonographer Comments: Technically difficult study due to poor echo windows. Image acquisition challenging due to patient body habitus and Image acquisition challenging due to COPD. IMPRESSIONS  1. Left ventricular ejection fraction, by estimation, is 55 to 60%. The left ventricle has normal function. The left ventricle has no regional wall motion abnormalities. Left ventricular diastolic parameters are consistent with Grade I diastolic dysfunction (impaired relaxation).  2. Right ventricular systolic function is normal. The right ventricular size is mildly enlarged.  3. Left atrial size was mildly dilated.  4. Right atrial size was mildly dilated.  5. The mitral valve was not well visualized. Trivial mitral valve regurgitation.  6. The aortic valve was not well visualized. Aortic valve regurgitation is trivial. FINDINGS  Left Ventricle: Left ventricular ejection fraction, by estimation, is 55 to 60%. The left ventricle has normal function. The left ventricle has no regional wall motion abnormalities. Definity contrast agent was given IV to delineate the left ventricular  endocardial borders. The left ventricular internal cavity size was normal in size. There is borderline left ventricular hypertrophy. Left ventricular diastolic parameters are consistent with Grade I diastolic dysfunction (impaired relaxation). Right Ventricle: The right ventricular size is mildly enlarged. No increase in right ventricular wall thickness. Right ventricular systolic function is normal. Left Atrium: Left atrial size was mildly dilated. Right Atrium: Right atrial size was mildly dilated. Pericardium: There is no evidence of pericardial effusion. Mitral Valve: The mitral valve was not well visualized. Trivial mitral valve regurgitation. MV peak gradient, 7.4 mmHg. The mean mitral valve gradient is 3.0 mmHg. Tricuspid Valve: The tricuspid valve is not well visualized.  Tricuspid valve regurgitation is trivial. Aortic Valve: The aortic valve was not well visualized. Aortic valve regurgitation is trivial. Aortic valve mean gradient measures 3.0 mmHg. Aortic valve peak gradient measures 6.8 mmHg. Aortic valve area, by VTI measures 2.46 cm. Pulmonic Valve: The pulmonic valve was not well visualized. Pulmonic valve regurgitation is  not visualized. Aorta: The aortic root was not well visualized. IAS/Shunts: The interatrial septum was not assessed.  LEFT VENTRICLE PLAX 2D LVIDd:         4.15 cm     Diastology LVIDs:         3.64 cm     LV e' medial:    5.98 cm/s LV PW:         1.43 cm     LV E/e' medial:  18.3 LV IVS:        1.12 cm     LV e' lateral:   6.53 cm/s LVOT diam:     2.00 cm     LV E/e' lateral: 16.8 LV SV:         55 LV SV Index:   29 LVOT Area:     3.14 cm  LV Volumes (MOD) LV vol d, MOD A2C: 55.5 ml LV vol d, MOD A4C: 63.8 ml LV vol s, MOD A2C: 34.3 ml LV vol s, MOD A4C: 23.4 ml LV SV MOD A2C:     21.2 ml LV SV MOD A4C:     63.8 ml LV SV MOD BP:      34.2 ml RIGHT VENTRICLE RV Basal diam:  3.76 cm LEFT ATRIUM             Index       RIGHT ATRIUM           Index LA diam:        4.90 cm 2.61 cm/m  RA Area:     19.80 cm LA Vol (A2C):   59.5 ml 31.67 ml/m RA Volume:   61.10 ml  32.52 ml/m LA Vol (A4C):   57.5 ml 30.61 ml/m LA Biplane Vol: 61.2 ml 32.58 ml/m  AORTIC VALVE                   PULMONIC VALVE AV Area (Vmax):    2.51 cm    PV Vmax:       0.76 m/s AV Area (Vmean):   2.43 cm    PV Vmean:      53.200 cm/s AV Area (VTI):     2.46 cm    PV VTI:        0.126 m AV Vmax:           130.00 cm/s PV Peak grad:  2.3 mmHg AV Vmean:          83.500 cm/s PV Mean grad:  1.0 mmHg AV VTI:            0.225 m AV Peak Grad:      6.8 mmHg AV Mean Grad:      3.0 mmHg LVOT Vmax:         104.00 cm/s LVOT Vmean:        64.700 cm/s LVOT VTI:          0.176 m LVOT/AV VTI ratio: 0.78  AORTA Ao Root diam: 3.10 cm MITRAL VALVE                TRICUSPID VALVE MV Area (PHT): 3.00 cm     TR  Peak grad:   28.9 mmHg MV Peak grad:  7.4 mmHg     TR Vmax:        269.00 cm/s MV Mean grad:  3.0 mmHg MV Vmax:       1.36 m/s     SHUNTS MV Vmean:      86.9 cm/s  Systemic VTI:  0.18 m MV Decel Time: 253 msec     Systemic Diam: 2.00 cm MV E velocity: 109.50 cm/s MV A velocity: 137.50 cm/s MV E/A ratio:  0.80 Bartholome Bill MD Electronically signed by Bartholome Bill MD Signature Date/Time: 04/04/2020/4:54:02 PM    Final    US Abdomen Limited RUQ (LIVER/GB)  Result Date: 04/11/2020 CLINICAL DATA:  Hyperbilirubinemia in a 74 year old female EXAM: ULTRASOUND ABDOMEN LIMITED RIGHT UPPER QUADRANT COMPARISON:  April 07, 2020 CT angiography of the chest and CT of the chest of April 06, 2020 FINDINGS: Gallbladder: Wall thickness at upper limits of normal. No visible pericholecystic fluid or evidence of cholelithiasis. No reported tenderness over the gallbladder. Common bile duct: Diameter: 2.2 mm Liver: Mildly heterogeneous echotexture with nodular contour and suggestion of fissural widening particularly on image 22. No visible lesion on submitted images. Portal vein is patent on color Doppler imaging with normal direction of blood flow towards the liver. Other: Trace ascites IMPRESSION: 1. Heterogeneous, mildly heterogeneous hepatic echotexture with question of lobular contour and fissural widening, findings could be seen in the setting of early liver disease. 2. Gallbladder wall thickness at upper limits of normal could be seen in the setting of liver disease and is nonspecific, not associated with biliary calculi, reported tenderness or biliary duct dilation. Electronically Signed   By: Zetta Bills M.D.   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  -12/15- DCD lasix , DCD solumedrol , started PO prednisone taper at 65m po daily   Altered mental status with confusion-imporoved  -severe hypercapnic 3encephalopathy -Meds have been adjusted and mentation is improved -will obtain ABG to evaluate hypercanic encephalopathy -continue on BIPAP -[facility] - BIPAP qualifiacation - spirometry in process s/p ABG       Thank you for allowing me to participate in the care of this patient.    Patient/Family are satisfied with care plan and all questions have been answered.   This document was prepared using Dragon voice recognition software and may include unintentional dictation errors.     FOttie Glazier M.D.  Division of [facility]

## 2020-04-12 NOTE — Plan of Care (Addendum)
Shift summary:  - Patient is awake and alert, eating breakfast. - C/C this AM is new nausea/vomiting/diarrhea. - 6L HFNC; D5W @ 50 mL/hr. - IVFs stopped by Nephrology. - CT ABD, Chest completed. - NPO          Problem: Education: Goal: Knowledge of General Education information will improve Description: Including pain rating scale, medication(s)/side effects and non-pharmacologic comfort measures Outcome: Progressing   Problem: Health Behavior/Discharge Planning: Goal: Ability to manage health-related needs will improve Outcome: Progressing   Problem: Clinical Measurements: Goal: Ability to maintain clinical measurements within normal limits will improve Outcome: Progressing Goal: Will remain free from infection Outcome: Progressing Goal: Diagnostic test results will improve Outcome: Progressing Goal: Respiratory complications will improve Outcome: Progressing Goal: Cardiovascular complication will be avoided Outcome: Progressing   Problem: Activity: Goal: Risk for activity intolerance will decrease Outcome: Progressing   Problem: Nutrition: Goal: Adequate nutrition will be maintained Outcome: Progressing   Problem: Coping: Goal: Level of anxiety will decrease Outcome: Progressing   Problem: Elimination: Goal: Will not experience complications related to bowel motility Outcome: Progressing Goal: Will not experience complications related to urinary retention Outcome: Progressing   Problem: Pain Managment: Goal: General experience of comfort will improve Outcome: Progressing   Problem: Safety: Goal: Ability to remain free from injury will improve Outcome: Progressing   Problem: Skin Integrity: Goal: Risk for impaired skin integrity will decrease Outcome: Progressing   Problem: Education: Goal: Ability to demonstrate management of disease process will improve Outcome: Progressing Goal: Ability to verbalize understanding of medication therapies will  improve Outcome: Progressing Goal: Individualized Educational Video(s) Outcome: Progressing   Problem: Activity: Goal: Capacity to carry out activities will improve Outcome: Progressing   Problem: Cardiac: Goal: Ability to achieve and maintain adequate cardiopulmonary perfusion will improve Outcome: Progressing

## 2020-04-12 NOTE — Progress Notes (Signed)
[facility] Cardiology  SUBJECTIVE: Laying in bed, appears more alert and lucid, answering questions appropriately, denies chest pain or shortness of breath   Vitals:   04/11/20 2230 04/12/20 0000 04/12/20 0400 04/12/20 0456  BP: (!) 146/81 (!) 167/108    Pulse: (!) 58 (!) 58  84  Resp: 16 (!) 22  (!) 25  Temp:  98.4 F (36.9 C) 98.7 F (37.1 C)   TempSrc:  Oral Oral   SpO2: 100% 100%  99%  Weight:      Height:         Intake/Output Summary (Last 24 hours) at 04/12/2020 7412 Last data filed at 04/11/2020 1800 Gross per 24 hour  Intake --  Output 925 ml  Net -925 ml      PHYSICAL EXAM  General: Well developed, well nourished, in no acute distress HEENT:  Normocephalic and atramatic Neck:  No JVD.  Lungs: Clear bilaterally to auscultation and percussion. Heart: HRRR . Normal S1 and S2 without gallops or murmurs.  Abdomen: Bowel sounds are positive, abdomen soft and non-tender  Msk:  Back normal, normal gait. Normal strength and tone for age. Extremities: No clubbing, cyanosis or edema.   Neuro: Alert and oriented X 3. Psych:  Good affect, responds appropriately   LABS: Basic Metabolic Panel: Recent Labs    04/11/20 0428 04/12/20 0623  NA 147* 142  K 3.3* 3.1*  CL 94* 84*  CO2 39* 44*  GLUCOSE 220* 328*  BUN 40* 37*  CREATININE 0.62 0.55  CALCIUM 9.2 9.3  MG 2.2 2.2   Liver Function Tests: Recent Labs    04/11/20 0428 04/12/20 0623  AST 19 25  ALT 16 29  ALKPHOS 74 84  BILITOT 2.1* 2.2*  PROT 7.0 6.6  ALBUMIN 4.4 4.1   No results for input(s): LIPASE, AMYLASE in the last 72 hours. CBC: Recent Labs    04/11/20 0428 04/12/20 0623  WBC 13.9* 16.2*  HGB 15.3* 15.7*  HCT 51.1* 51.5*  MCV 94.1 91.8  PLT 191 179   Cardiac Enzymes: No results for input(s): CKTOTAL, CKMB, CKMBINDEX, TROPONINI in the last 72 hours. BNP: Invalid input(s): POCBNP D-Dimer: No results for input(s): DDIMER in the last 72 hours. Hemoglobin A1C: No results for input(s):  HGBA1C in the last 72 hours. Fasting Lipid Panel: No results for input(s): CHOL, HDL, LDLCALC, TRIG, CHOLHDL, LDLDIRECT in the last 72 hours. Thyroid Function Tests: No results for input(s): TSH, T4TOTAL, T3FREE, THYROIDAB in the last 72 hours.  Invalid input(s): FREET3 Anemia Panel: No results for input(s): VITAMINB12, FOLATE, FERRITIN, TIBC, IRON, RETICCTPCT in the last 72 hours.  US Abdomen Limited RUQ (LIVER/GB)  Result Date: 04/11/2020 CLINICAL DATA:  Hyperbilirubinemia in a 74 year old female EXAM: ULTRASOUND ABDOMEN LIMITED RIGHT UPPER QUADRANT COMPARISON:  April 07, 2020 CT angiography of the chest and CT of the chest of April 06, 2020 FINDINGS: Gallbladder: Wall thickness at upper limits of normal. No visible pericholecystic fluid or evidence of cholelithiasis. No reported tenderness over the gallbladder. Common bile duct: Diameter: 2.2 mm Liver: Mildly heterogeneous echotexture with nodular contour and suggestion of fissural widening particularly on image 22. No visible lesion on submitted images. Portal vein is patent on color Doppler imaging with normal direction of blood flow towards the liver. Other: Trace ascites IMPRESSION: 1. Heterogeneous, mildly heterogeneous hepatic echotexture with question of lobular contour and fissural widening, findings could be seen in the setting of early liver disease. 2. Gallbladder wall thickness at upper limits of normal could be  seen in the setting of liver disease and is nonspecific, not associated with biliary calculi, reported tenderness or biliary duct dilation. Electronically Signed   By: Zetta Bills M.D.   On: 04/11/2020 12:23     Echo LVEF 55 to 60%  TELEMETRY: Sinus rhythm:  ASSESSMENT AND PLAN:  Principal Problem:   Acute CHF (congestive heart failure) (HCC) Active Problems:   COPD exacerbation (HCC)   Diabetes mellitus without complication ([place])   Current tobacco use   Hyperlipidemia   Essential hypertension    Hypothyroidism   Gout   Depression   Tremors of nervous system   Acute respiratory failure with hypoxia (HCC)   Cellulitis of lower extremity   Acute pulmonary edema (HCC)   Elevated brain natriuretic peptide (BNP) level   Nephrotic syndrome    1. Acute on chronic diastolic congestive heart failure,LVEF 55 to 60% by 2D echocardiogram,  furosemide 40 mg IV as needed 2.Acute on chronic hypoxic respiratory failure, multifactorial, secondary to CHF, and right lower lobe pneumonia, followed by pulmonary, O2 by nasal cannula 3.Metabolic encephalopathy, patient appears more alert today, answering questions appropriately 4.Nephrotic syndrome, followed by nephrology 5.Essential hypertension,on amlodipine and losartan,blood pressure remains mildly elevated 6.Hypokalemia, potassium 3.1  Recommendations  1.  Diuresis as needed 2.  Carefully monitor renal status 3.  Uptitrate metoprolol to tartrate to 50 mg twice daily 4.  Defer further cardiac diagnostics at this time 5.  Follow-up with me 1 to 2 weeks after discharge   Isaias Cowman, MD, PhD, [facility] 04/12/2020 7:52 AM

## 2020-04-12 NOTE — Progress Notes (Signed)
SLP Cancellation Note  Patient Details Name: Martha [name] MRN: [id number] DOB: February 26, 1946   Cancelled treatment:       Reason Eval/Treat Not Completed: Medical issues which prohibited therapy (chart reviewed; consulted NSG re: pt's status). Per NSG notes, pt had been awake and eating some at her breakfast but then had new episode of N/V/D w/ frequent cough, productive and brown in color. MD made pt NPO. CT chest/abd ordered. ST services will continue to f/u w/ pt's status while admitted. Recommend frequent oral care for hygiene and stimulation of swallowing. Aspiration precautions.     Orinda Kenner, MS, CCC-SLP Speech Language Pathologist [facility] [phone] [facility] 04/12/2020, 3:28 PM

## 2020-04-12 NOTE — Consult Note (Signed)
NAME: Martha [name]  DOB: 06-30-1945  MRN: [id number]  Date/Time: 04/12/2020 3:45 PM  REQUESTING PROVIDER: Dr. Si Raider Subjective:  REASON FOR CONSULT: Diarrhea with abdominal pain ?Daughter at bedside was giving history.  Patient is a poor historian. Martha [name] is a 74 y.o. female with a history of COPD, diabetes mellitus, hypertension presented to the ED on 04/03/2020 through a private vehicle complaining of edema of legs and shortness of breath.  Patient has recently had severe edema of her legs with blisters bilaterally on the calf area.  She is also been having shortness of breath.  She was noted to have severe nephrotic range proteinuria.  She was seen by nephrologist  as outpatient and.. was scheduled to undergo renal biopsy.  But because of the severity of the edema legs it could not be done .Daughter decided to bring her to the ED the next week because of worsening edema.  Patient is a current day smoker.  In the ED BP was 93/76, heart rate 100, temperature 98.8, pulse ox of 83%. Patient has chronic cough.  Labs revealed BNP of 1113, troponin X, negative PCR for Covid, creatinine 0.86, WBC was 10.7, Hb 13.5, hematocrit 53.7, platelet 296.  Albumin was 2.7.  Procalcitonin was less than 0.10. Chest x-ray showed borderline cardiomegaly with mild pulmonary edema.  She was admitted with a diagnosis of acute respiratory failure with hypoxia due to combination of acute CHF and COPD exacerbation.  She was started on BiPAP bronchodilators and IV Lasix.  Cardiology was consulted.  She was also started on Solu-Medrol for COPD exacerbation and ceftriaxone for leg cellulitis.  Patient was seen by wound care and placed on a Unna boot.  She was seen by cardiologist.  Patient also was seen by nephrologist.  During this hospitalization patient has had intermittent abdominal pain and chest pain.  She had a CT chest on 04/06/2020 which showed mild to moderate severe posterior bilateral upper lobe and left basilar  atelectasis and/or infiltrate.  There was moderate to markedly severe posterior right basilar atelectasis.  Mild cardiomegaly.  Stable emphysematous lung disease.  Chronic left lateral eighth  rib fracture Chronic compression fracture deformity of the T12 vertebral body.  On 04/08/2020 she had acute toxic metabolic encephalopathy and there was a concern that this could have been pneumonia.  Her antibiotics was broadened to include vancomycin Zosyn and azithromycin for possible pneumonia.  She was also seen by pulmonologist. Transferred to SDU on 12/11, BIPAP mask. Found to be on multiple meds which could make her drowsy and she ws not taking these at homePatient on gabapentin, fentanyl, Ativan, Zanaflex, Zoloft, Requip, oxycodone She had an ultrasound of the liver gallbladder on 04/11/2020 which noted lobular contour liver and gallbladder wall thickness at upper limit of normal which was nonspecific finding. 12/14 had abdominal pain -lower abdomen- she was getting in and out cath because of incomplete bladder emptying- so they had to put a foley cath today and that relieved the pain. Pt had 4 episodes of type 7 stool today and I am asked to se eher. She is out of the ICU to the floor  Past Medical History:  Diagnosis Date   Abnormal LFTs (liver function tests)    Anxiety    Arthritis    knees, feet   Asthma    Chronic bronchitis (HCC)    Chronic constipation    Chronic low back pain    Cigarette smoker    Has cut back Smoking to 1  pack every other day   COPD (chronic obstructive pulmonary disease) (HCC)    Depression    Diabetic neuropathy ([place])    Diverticulitis 2015   gi recommended repeat scope in 10 years   Essential hypertension    Fatty liver    GERD (gastroesophageal reflux disease)    Gout    History of mammogram 05/28/2013   Hyperlipidemia    Hypothyroidism    Insomnia    Iron deficiency anemia    Low serum vitamin D    Personal history of tobacco use,  presenting hazards to health 09/29/2015   Plantar fasciitis    RLS (restless legs syndrome)    Shortness of breath dyspnea    Thyroid cancer ([place]) 2009   Tremors of nervous system    "I think I have parkinson's disease"   Type 2 diabetes mellitus ([place])    Wears dentures    uppers    Past Surgical History:  Procedure Laterality Date   CATARACT EXTRACTION W/PHACO Left 11/24/2015   Procedure: CATARACT EXTRACTION PHACO AND INTRAOCULAR LENS PLACEMENT ([place]);  Surgeon: Birder Robson, MD;  Location: [facility];  Service: Ophthalmology;  Laterality: Left;  [place] 38.3 AP% 20.6 CDE 7.89 FLUID PACK LOT # [id number]   CATARACT EXTRACTION W/PHACO Right 06/23/2019   Procedure: CATARACT EXTRACTION PHACO AND INTRAOCULAR LENS PLACEMENT ([place]) RIGHT DIABETIC;  Surgeon: Birder Robson, MD;  Location: [facility];  Service: Ophthalmology;  Laterality: Right;  Diabetic   COLONOSCOPY  2004, 2009, 2015   [place]   POLYPECTOMY  2009   THYROIDECTOMY  2009   states she was tx with radiactive iodine   TUBAL LIGATION     UPPER GASTROINTESTINAL ENDOSCOPY      Social History   Socioeconomic History   Marital status: Divorced    Spouse name: Not on file   Number of children: Not on file   Years of education: Not on file   Highest education level: Not on file  Occupational History   Not on file  Tobacco Use   Smoking status: Current Every Day Smoker    Packs/day: 1.00    Years: 39.50    Pack years: 39.50    Types: Cigarettes   Smokeless tobacco: Never Used   Tobacco comment: curently smokes 12 cigerattes a day. I'm trying to cut back.  Vaping Use   Vaping Use: Never used  Substance and Sexual Activity   Alcohol use: No    Alcohol/week: 0.0 standard drinks   Drug use: No   Sexual activity: Never  Other Topics Concern   Not on file  Social History Narrative   Not on file   Social Determinants of Health   Financial Resource Strain: Not on file  Food Insecurity:  Not on file  Transportation Needs: Not on file  Physical Activity: Not on file  Stress: Not on file  Social Connections: Not on file  Intimate Partner Violence: Not on file    Family History  Problem Relation Age of Onset   Diabetes Mellitus II Father    Lung disease Father    Diabetes Mellitus II Mother    Arthritis Mother    Thyroid disease Other    Asthma Sister    Emphysema Sister    Hypertension Sister    Breast cancer Paternal Aunt    Allergies  Allergen Reactions   Nicotine Polacrilex Cough    Onset 04/29/2006.   Nicotrol [Nicotine] Cough   Codeine Sulfate Itching    Onset 11/24/1998. tingling  Current Facility-Administered Medications  Medication Dose Route Frequency Provider Last Rate Last Admin   0.9 %  sodium chloride infusion  250 mL Intravenous PRN Ivor Costa, MD   Stopped at 04/12/20 1444   acetaminophen (TYLENOL) suppository 650 mg  650 mg Rectal Q6H PRN Lang Snow, NP   650 mg at 04/08/20 2234   acetaminophen (TYLENOL) tablet 650 mg  650 mg Oral Q6H PRN Lorella Nimrod, MD   650 mg at 04/06/20 1638   albuterol (PROVENTIL) (2.5 MG/3ML) 0.083% nebulizer solution 2.5 mg  2.5 mg Nebulization Q4H PRN Ivor Costa, MD       amLODipine (NORVASC) tablet 10 mg  10 mg Oral Daily Gwynne Edinger, MD   10 mg at 04/12/20 3790   ascorbic acid (VITAMIN C) tablet 1,000 mg  1,000 mg Oral Daily Ivor Costa, MD   1,000 mg at 04/12/20 0942   chlorhexidine (PERIDEX) 0.12 % solution 15 mL  15 mL Mouth Rinse BID Gwynne Edinger, MD   15 mL at 04/12/20 1447   Chlorhexidine Gluconate Cloth 2 % PADS 6 each  6 each Topical Daily Gwynne Edinger, MD   6 each at 04/12/20 0902   dextromethorphan-guaiFENesin ([place] DM) 30-600 MG per 12 hr tablet 1 tablet  1 tablet Oral BID PRN Ivor Costa, MD   1 tablet at 04/06/20 0945   fluticasone (FLONASE) 50 MCG/ACT nasal spray 1 spray  1 spray Each Nare BID Sharion Settler, NP   1 spray at 04/12/20 0940    fluticasone (FLONASE) 50 MCG/ACT nasal spray 1-2 spray  1-2 spray Each Nare Daily PRN Ivor Costa, MD       insulin aspart (novoLOG) injection 0-5 Units  0-5 Units Subcutaneous QHS Ivor Costa, MD   3 Units at 04/11/20 2212   insulin aspart (novoLOG) injection 0-9 Units  0-9 Units Subcutaneous TID WC Ivor Costa, MD   7 Units at 04/12/20 1146   [START ON 04/13/2020] insulin glargine (LANTUS) injection 20 Units  20 Units Subcutaneous Daily Wouk, Ailene Rud, MD       ipratropium-albuterol (DUONEB) 0.5-2.5 (3) MG/3ML nebulizer solution 3 mL  3 mL Nebulization TID Ivor Costa, MD   3 mL at 04/12/20 1431   labetalol (NORMODYNE) injection 20 mg  20 mg Intravenous Q2H PRN Gwynne Edinger, MD   20 mg at 04/12/20 1529   levothyroxine (SYNTHROID) tablet 150 mcg  150 mcg Oral Q0600 Ivor Costa, MD   150 mcg at 04/08/20 0616   losartan (COZAAR) tablet 100 mg  100 mg Oral Daily Gwynne Edinger, MD   100 mg at 04/12/20 2409   MEDLINE mouth rinse  15 mL Mouth Rinse q12n4p Gwynne Edinger, MD   15 mL at 04/12/20 1528   methylPREDNISolone sodium succinate (SOLU-MEDROL) 40 mg/mL injection 20 mg  20 mg Intravenous Q12H Ottie Glazier, MD   20 mg at 04/12/20 1408   metoprolol tartrate (LOPRESSOR) tablet 50 mg  50 mg Oral BID Paraschos, Alexander, MD   50 mg at 04/12/20 0945   nitroGLYCERIN (NITROSTAT) SL tablet 0.4 mg  0.4 mg Sublingual Q5 min PRN Gwynne Edinger, MD   0.4 mg at 04/08/20 0659   ondansetron (ZOFRAN) injection 4 mg  4 mg Intravenous Q8H PRN Ivor Costa, MD   4 mg at 04/12/20 0856   pantoprazole (PROTONIX) injection 40 mg  40 mg Intravenous Q12H Gwynne Edinger, MD   40 mg at 04/12/20 1444   piperacillin-tazobactam (ZOSYN) IVPB 3.375  g  3.375 g Intravenous Q8H Wouk, Ailene Rud, MD   Stopped at 04/12/20 1253   potassium chloride 10 mEq in 100 mL IVPB  10 mEq Intravenous Q1 Hr x 4 Wouk, Ailene Rud, MD 100 mL/hr at 04/12/20 1500 Infusion Verify at 04/12/20 1500   potassium  chloride SA (KLOR-CON) CR tablet 40 mEq  40 mEq Oral BID Gwynne Edinger, MD   40 mEq at 04/12/20 0941   rosuvastatin (CRESTOR) tablet 5 mg  5 mg Oral Daily Ivor Costa, MD   5 mg at 04/12/20 0945   sodium chloride flush (NS) 0.9 % injection 3 mL  3 mL Intravenous Q12H Ivor Costa, MD   3 mL at 04/11/20 2153   sodium chloride flush (NS) 0.9 % injection 3 mL  3 mL Intravenous PRN Ivor Costa, MD       vancomycin (VANCOREADY) IVPB 1500 mg/300 mL  1,500 mg Intravenous Once Dorothe Pea, [place]       [START ON 04/13/2020] vancomycin (VANCOREADY) IVPB 500 mg/100 mL  500 mg Intravenous Q12H Dolan, Carissa E, RPH       vitamin B-12 (CYANOCOBALAMIN) tablet 1,000 mcg  1,000 mcg Oral Daily Ivor Costa, MD   1,000 mcg at 04/12/20 0944     Abtx:  Anti-infectives (From admission, onward)   Start     Dose/Rate Route Frequency Ordered Stop   04/13/20 0330  vancomycin (VANCOREADY) IVPB 500 mg/100 mL        500 mg 100 mL/hr over 60 Minutes Intravenous Every 12 hours 04/12/20 1458     04/12/20 1515  vancomycin (VANCOREADY) IVPB 1500 mg/300 mL        1,500 mg 150 mL/hr over 120 Minutes Intravenous  Once 04/12/20 1428     04/08/20 2200  vancomycin (VANCOREADY) IVPB 750 mg/150 mL  Status:  Discontinued        750 mg 150 mL/hr over 60 Minutes Intravenous Every 12 hours 04/08/20 1005 04/08/20 1318   04/08/20 1100  vancomycin (VANCOREADY) IVPB 1750 mg/350 mL  Status:  Discontinued        1,750 mg 175 mL/hr over 120 Minutes Intravenous  Once 04/08/20 1005 04/08/20 1318   04/08/20 1100  azithromycin (ZITHROMAX) tablet 500 mg        500 mg Oral Daily 04/08/20 1005 04/11/20 0959   04/08/20 1015  piperacillin-tazobactam (ZOSYN) IVPB 3.375 g        3.375 g 12.5 mL/hr over 240 Minutes Intravenous Every 8 hours 04/08/20 1005     04/07/20 0600  cephALEXin (KEFLEX) capsule 500 mg        500 mg Oral Every 8 hours 04/06/20 1146 04/07/20 2121   04/05/20 1000  cefTRIAXone (ROCEPHIN) 2 g in sodium chloride 0.9 % 100  mL IVPB  Status:  Discontinued        2 g 200 mL/hr over 30 Minutes Intravenous Every 24 hours 04/04/20 1525 04/06/20 1146   04/04/20 1100  cefTRIAXone (ROCEPHIN) 2 g in sodium chloride 0.9 % 100 mL IVPB  Status:  Discontinued        2 g 200 mL/hr over 30 Minutes Intravenous Every 24 hours 04/04/20 1023 04/04/20 1510   04/03/20 1500  cefTRIAXone (ROCEPHIN) 1 g in sodium chloride 0.9 % 100 mL IVPB  Status:  Discontinued        1 g 200 mL/hr over 30 Minutes Intravenous Every 24 hours 04/03/20 1445 04/04/20 1023      REVIEW OF SYSTEMS:  Const: negative fever,  negative chills, negative weight loss Eyes: negative diplopia or visual changes, negative eye pain ENT: negative coryza, negative sore throat Resp: has cough, productive of sputum but she is unable to cough up all the time, dyspnea Cards: negative for chest pain, palpitations, has lower extremity edema, better now GU: difficulty in passing urine GI:  abdominal pain, diarrhea,  Skin: blisters calf Heme: easy bruising MS: weakness Neurolo:memory problems  Psych: negative for feelings of anxiety, depression  Endocrine:  diabetes Allergy/Immunology- as above Objective:  VITALS:  BP (!) 173/76    Pulse 68    Temp 98 F (36.7 C) (Axillary)    Resp 18    Ht 5\' 4"  (1.626 m)    Wt 66.9 kg    SpO2 95%    BMI 25.32 kg/m  PHYSICAL EXAM:  General: Awake, , cooperative, no distress, on 2 liters .  Head: Normocephalic, without obvious abnormality, atraumatic. Eyes: Conjunctivae clear, anicteric sclerae. Pupils are equal ENT Nares normal. No drainage or sinus tenderness. Lips, mucosa, and tongue normal. No Thrush, edentulous Neck: Supple, symmetrical, no adenopathy, thyroid: non tender no carotid bruit and no JVD. Back: No CVA tenderness. Lungs: b/l air entry- rhonchi. Heart: irregular Abdomen: Soft,t distended. Bowel sounds normal. No masses Extremities legs covered with unna boot- did not remove Skin: ecchymosis hands Lymph:  Cervical, supraclavicular normal. Neurologic: Grossly non-focal Pertinent Labs Lab Results CBC    Component Value Date/Time   WBC 16.2 (H) 04/12/2020 0623   RBC 5.61 (H) 04/12/2020 0623   HGB 15.7 (H) 04/12/2020 0623   HCT 51.5 (H) 04/12/2020 0623   PLT 179 04/12/2020 0623   MCV 91.8 04/12/2020 0623   MCH 28.0 04/12/2020 0623   MCHC 30.5 04/12/2020 0623   RDW 16.9 (H) 04/12/2020 0623   LYMPHSABS 0.6 (L) 04/03/2020 1141   MONOABS 0.5 04/03/2020 1141   EOSABS 0.1 04/03/2020 1141   BASOSABS 0.1 04/03/2020 1141    CMP Latest Ref Rng & Units 04/12/2020 04/11/2020 04/10/2020  Glucose 70 - 99 mg/dL 328(H) 220(H) 228(H)  BUN 8 - 23 mg/dL 37(H) 40(H) 42(H)  Creatinine 0.44 - 1.00 mg/dL 0.55 0.62 0.61  Sodium 135 - 145 mmol/L 142 147(H) 147(H)  Potassium 3.5 - 5.1 mmol/L 3.1(L) 3.3(L) 3.1(L)  Chloride 98 - 111 mmol/L 84(L) 94(L) 94(L)  CO2 22 - 32 mmol/L 44(H) 39(H) 38(H)  Calcium 8.9 - 10.3 mg/dL 9.3 9.2 9.2  Total Protein 6.5 - 8.1 g/dL 6.6 7.0 -  Total Bilirubin 0.3 - 1.2 mg/dL 2.2(H) 2.1(H) -  Alkaline Phos 38 - 126 U/L 84 74 -  AST 15 - 41 U/L 25 19 -  ALT 0 - 44 U/L 29 16 -      Microbiology: Recent Results (from the past 240 hour(s))  Resp Panel by RT-PCR (Flu A&B, Covid) Nasopharyngeal Swab     Status: None   Collection Time: 04/03/20 11:41 AM   Specimen: Nasopharyngeal Swab; Nasopharyngeal(NP) swabs in vial transport medium  Result Value Ref Range Status   SARS Coronavirus 2 by RT PCR NEGATIVE NEGATIVE Final    Comment: (NOTE) SARS-CoV-2 target nucleic acids are NOT DETECTED.  The SARS-CoV-2 RNA is generally detectable in upper respiratory specimens during the acute phase of infection. The lowest concentration of SARS-CoV-2 viral copies this assay can detect is 138 copies/mL. A negative result does not preclude SARS-Cov-2 infection and should not be used as the sole basis for treatment or other patient management decisions. A negative result may occur with  improper specimen collection/handling, submission of specimen other than nasopharyngeal swab, presence of viral mutation(s) within the areas targeted by this assay, and inadequate number of viral copies(<138 copies/mL). A negative result must be combined with clinical observations, patient history, and epidemiological information. The expected result is Negative.  Fact Sheet for Patients:  EntrepreneurPulse.com.au  Fact Sheet for Healthcare Providers:  IncredibleEmployment.be  This test is no t yet approved or cleared by the [place] FDA and  has been authorized for detection and/or diagnosis of SARS-CoV-2 by FDA under an Emergency Use Authorization (EUA). This EUA will remain  in effect (meaning this test can be used) for the duration of the COVID-19 declaration under Section 564(b)(1) of the Act, 21 U.S.C.section 360bbb-3(b)(1), unless the authorization is terminated  or revoked sooner.       Influenza A by PCR NEGATIVE NEGATIVE Final   Influenza B by PCR NEGATIVE NEGATIVE Final    Comment: (NOTE) The Xpert Xpress SARS-CoV-2/FLU/RSV plus assay is intended as an aid in the diagnosis of influenza from Nasopharyngeal swab specimens and should not be used as a sole basis for treatment. Nasal washings and aspirates are unacceptable for Xpert Xpress SARS-CoV-2/FLU/RSV testing.  Fact Sheet for Patients: EntrepreneurPulse.com.au  Fact Sheet for Healthcare Providers: IncredibleEmployment.be  This test is not yet approved or cleared by the [place] FDA and has been authorized for detection and/or diagnosis of SARS-CoV-2 by FDA under an Emergency Use Authorization (EUA). This EUA will remain in effect (meaning this test can be used) for the duration of the COVID-19 declaration under Section 564(b)(1) of the Act, 21 U.S.C. section 360bbb-3(b)(1), unless the authorization is terminated  or revoked.  Performed at [facility], [address] [place], [place] [address]   Culture, blood (Routine X 2) w Reflex to ID Panel     Status: None   Collection Time: 04/03/20  3:27 PM   Specimen: BLOOD  Result Value Ref Range Status   Specimen Description BLOOD BLOOD RIGHT ARM  Final   Special Requests   Final    BOTTLES DRAWN AEROBIC AND ANAEROBIC Blood Culture adequate volume   Culture   Final    NO GROWTH 5 DAYS Performed at [facility], [address], [place], [place] [address]    Report Status 04/08/2020 FINAL  Final  Urine Culture     Status: Abnormal   Collection Time: 04/03/20  4:00 PM   Specimen: Urine, Random  Result Value Ref Range Status   Specimen Description   Final    URINE, RANDOM Performed at [facility], [address], [place], [place] [address]    Special Requests   Final    NONE Performed at [facility], [address], [place], [place] [address]    Culture (A)  Final    >=100,000 COLONIES/mL ESCHERICHIA COLI >=100,000 COLONIES/mL AEROCOCCUS URINAE Standardized susceptibility testing for this organism is not available. Performed at [facility], [place] [address] [place], [place] [address]    Report Status 04/07/2020 FINAL  Final   Organism ID, Bacteria ESCHERICHIA COLI (A)  Final      Susceptibility   Escherichia coli - MIC*    AMPICILLIN 4 SENSITIVE Sensitive     CEFAZOLIN <=4 SENSITIVE Sensitive     CEFEPIME <=0.12 SENSITIVE Sensitive     CEFTRIAXONE <=0.25 SENSITIVE Sensitive     CIPROFLOXACIN <=0.25 SENSITIVE Sensitive     GENTAMICIN <=1 SENSITIVE Sensitive     IMIPENEM <=0.25 SENSITIVE Sensitive  NITROFURANTOIN 32 SENSITIVE Sensitive     TRIMETH/SULFA <=20 SENSITIVE Sensitive     AMPICILLIN/SULBACTAM <=2 SENSITIVE Sensitive     PIP/TAZO <=4 SENSITIVE Sensitive     * >=100,000 COLONIES/mL ESCHERICHIA COLI  Culture, blood (Routine X 2) w Reflex to ID Panel     Status: None    Collection Time: 04/03/20 10:36 PM   Specimen: BLOOD  Result Value Ref Range Status   Specimen Description BLOOD BLOOD LEFT HAND  Final   Special Requests   Final    BOTTLES DRAWN AEROBIC AND ANAEROBIC Blood Culture adequate volume   Culture   Final    NO GROWTH 5 DAYS Performed at [facility], [address], [place], [place] [address]    Report Status 04/08/2020 FINAL  Final  Urine Culture     Status: None   Collection Time: 04/08/20  9:52 AM   Specimen: Urine, Random  Result Value Ref Range Status   Specimen Description   Final    URINE, RANDOM Performed at [facility], [address] [place], [place] [address]    Special Requests   Final    NONE Performed at [facility], [address] [place], [place] [address]    Culture   Final    NO GROWTH Performed at [facility], [place] [address] [place], [place] [address]    Report Status 04/09/2020 FINAL  Final  CULTURE, BLOOD (ROUTINE X 2) w Reflex to ID Panel     Status: None (Preliminary result)   Collection Time: 04/08/20 10:02 AM   Specimen: BLOOD  Result Value Ref Range Status   Specimen Description BLOOD BLOOD RIGHT HAND  Final   Special Requests   Final    BOTTLES DRAWN AEROBIC AND ANAEROBIC Blood Culture results may not be optimal due to an inadequate volume of blood received in culture bottles   Culture   Final    NO GROWTH 4 DAYS Performed at [facility], [address], [place], [place] [address]    Report Status PENDING  Incomplete  MRSA PCR Screening     Status: None   Collection Time: 04/08/20 10:04 AM   Specimen: Nasal Mucosa; Nasopharyngeal  Result Value Ref Range Status   MRSA by PCR NEGATIVE NEGATIVE Final    Comment:        The GeneXpert MRSA Assay (FDA approved for NASAL specimens only), is one component of a comprehensive MRSA colonization surveillance program. It is not intended to diagnose MRSA infection nor to guide or monitor treatment for MRSA  infections. Performed at [facility], [address] [place], [place] [address]   CULTURE, BLOOD (ROUTINE X 2) w Reflex to ID Panel     Status: None (Preliminary result)   Collection Time: 04/08/20 10:26 AM   Specimen: BLOOD  Result Value Ref Range Status   Specimen Description BLOOD BLOOD RIGHT HAND  Final   Special Requests   Final    BOTTLES DRAWN AEROBIC AND ANAEROBIC Blood Culture results may not be optimal due to an inadequate volume of blood received in culture bottles   Culture   Final    NO GROWTH 4 DAYS Performed at [facility], [address], [place], [place] [address]    Report Status PENDING  Incomplete  Urine Culture     Status: None   Collection Time: 04/10/20  6:21 PM   Specimen: Urine, Random  Result Value Ref Range Status   Specimen Description  Final    URINE, RANDOM Performed at [facility], [address], [place], [place] [address]    Special Requests   Final    NONE Performed at [facility], [address] [place], [place] [address]    Culture   Final    NO GROWTH Performed at [facility], [place] [address], [place], [place] [address]    Report Status 04/11/2020 FINAL  Final    IMAGING RESULTS:  I have personally reviewed the films ? Impression/Recommendation ? Acute hypoxic resp failure due to combination of COPD exacerbation and fluid overload-improved  Acute metabolic encephalopathy - combination of hypercarbia, medications- resolved  ?b/l pulmonary infiltrates- unlikely bacterial pneumonia as procal is < 0.10 Also been on 10 days of antibiotics with no change Possibility of mucus plugs, atelectasis and chf DC vanco and zosyn. Change to unasyn for possible aspiration pneumonia- would DC unasyn if no improvement in 48 hrs  Diarrhea today 4 episodes- none after that. 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

## 2020-04-12 NOTE — Progress Notes (Signed)
Inpatient Diabetes Program Recommendations  AACE/ADA: New Consensus Statement on Inpatient Glycemic Control  Target Ranges:  Prepandial:   less than 140 mg/dL      Peak postprandial:   less than 180 mg/dL (1-2 hours)      Critically ill patients:  140 - 180 mg/dL   Results for [name], Martha [name] (MRN [id number]) as of 04/12/2020 08:02  Ref. Range 04/11/2020 07:49 04/11/2020 12:18 04/11/2020 16:14 04/11/2020 16:47 04/11/2020 22:09 04/12/2020 07:43  Glucose-Capillary Latest Ref Range: 70 - 99 mg/dL 286 (H) 235 (H) 268 (H) 288 (H) 254 (H) 312 (H)   Review of Glycemic Control  Diabetes history: DM2 Outpatient Diabetes medications: Glipizide 10 mg BID, Metformin 500 mg BID Current orders for Inpatient glycemic control: Levemir 10 units daily, Novolog 0-9 units TID with meals, Novolog 0-5 units QHS; Solumedrol 20 mg Q12H  Inpatient Diabetes Program Recommendations:    Insulin: If steroids are continued as ordered, please consider increasing Levemir to 15 units daily and adding Novolog 4 units TID with meals for meal coverage if patient eats at least 50% of meals.  Thanks, Barnie Alderman, RN, MSN, CDE Diabetes Coordinator Inpatient Diabetes Program [phone] (Team Pager from 8am to 5pm)

## 2020-04-12 NOTE — Progress Notes (Signed)
PROGRESS NOTE    Martha [name]  XTA:[id number] DOB: [birth year] DOA: 04/03/2020 PCP: Tracie Harrier, MD   Brief Narrative: Taken from H&P Martha [name] is a 74 y.o. female with medical history significant of  HTN, HLD, DM, COPD, tobacco abuse, hypothyroidism, gout, depression, tremor, proteinuria, depression, anxiety, GERD, diverticulitis, who presents with bilateral leg edema and pain, and SOB.  Patient states that she has chronic bilateral leg edema which has been progressively worsened recently. Both lower legs are painful and erythematous. She states that she gets her lower extremities wrapped once a week.  She has chronic cough and shortness of breath which has slightly worsened on exertion.  She coughs up some white mucus.  No fever or chills.  No chest pain.  On presentation she was hypoxic requiring BiPAP, later transitioned to 4 L of oxygen. Patient also has an history of protein urea which is being worked up by nephrology as an outpatient and they are planning for biopsy.  Patient had pertinent labs positive for BNP of 1113, troponin and COVID-19 negative.  Chest x-ray with borderline cardiomegaly and mild pulmonary edema.  Significant edema of bilateral lower extremities with signs of venous congestion, skin peeling and few shallow ulcers.  Cardiology was consulted.  AM of 12/11 altered, worsening respiratory status. Placed on bipap, transferred to step-down, re-started abx and steroids.\  AM 12/14 frequent productive cough dark brown color, new diarrhea, complaining of abd pain  Subjective: Remains off bipap. frequent productive cough dark brown color, new diarrhea, complaining of abd pain, new this morning.   Assessment & Plan:   Principal Problem:   Acute CHF (congestive heart failure) (HCC) Active Problems:   COPD exacerbation (HCC)   Diabetes mellitus without complication ([place])   Current tobacco use   Hyperlipidemia   Essential hypertension   Hypothyroidism    Gout   Depression   Tremors of nervous system   Acute respiratory failure with hypoxia (HCC)   Cellulitis of lower extremity   Acute pulmonary edema (HCC)   Elevated brain natriuretic peptide (BNP) level   Nephrotic syndrome  Acute toxic metabolic encephalopathy Improving, off bipap, today more lucid and awake. ABG pending this morning but previous suggestive of contraction alkalosis with uncompensated hypercarbia.  Volume depleted from aggressive diuresis for nephrotic syndrome, acute hypoxic respiratory failure from copd/pna. - cont zosyn - cont solu-medrol - fluids on hold from nephro - continue step down - pulm following  Abdominal pain, brown secretions, diarrhea Concern for aspiration, infection - CT c/a/p ordered - npo - cont zosyn - ID consulted for c diff testing  Hyperbilirubinemia Indirect bili elevated, normal LFTS. Smear, ldh, cbc not suggestive of hemolysis. Unlikely hepatic/biliary in nature given normal lfts and alk phos, no ruq pain/tenderness. U/s unrevealing. - evaluating acute illness as above  Urinary retention Now for 48 hours, have been I/o cathing - place Foley  Acute hypoxic respiratory failure due to combination of acute on chronic CHF and COPD exacerbation with right lung pneumonia.  TTE w/ diastolic dysfunction. Here bnp elevated. S/p aggressive diuresis. Normal stress testing done in 2019.  CT obtained 12/8 for hemoptysis w/ atelectasis vs multifocal pna. covid neg on admission. CTA neg for PE. Troponins mildly elevated and flat, do not think acs - off lasix/albumin - cont O2 [place] @ 8 - cont zosyn - cont solu-medrol - unable to give sputum sample - blood cultures ngtd - repeat vbg in PM, if stable consider transfer back to floor  Choking Nurse reports  some gagging when taking meds this morning. Current pna poss aspiration - slp eval  Hemoptysis  Small volume, blood tinged sputum. No signs respiratory compromise. Suspect 2/2 coughing. Ct w/  contrast showing atelectasis/infiltrate, neg for PE - repeat CT as above  Proteinuria/nephrotic syndrome.  Patient is being managed by nephrology as an outpatient.  Can be contributing to her lower extremity edema.  Nephrology is following and they start her on Lasix infusion instead of boluses. - kidney biopsy now on hold - off lasix/albumin - nephrology following  Bilateral lower extremity venous stasis ulcerss.   Wound care consulted, compression wrapped. S/p ceftriaxone/keflex 5 days. Edema much improved - continue compression therapy  Bacteriuria Patient was unable to specify UTI symptoms but appears pan positive. UA with pyuria, bacteriuria and positive nitrites. -Urine culture growing E. Coli pan sensitive - s/p 5 day course ceftriaxone>keflex  Hypokalemia 3.1 today, had one dose of oral kcl yesterday - kcl 10 IV x4  Type 2 diabetes mellitus.  A1c of 6.5.  Patient was on Metformin and glipizide at home. Glucose 300s this morning in setting of steroids -Continue with SSI. - increase levemir to 20 qd today, start lantus 20 qd tomorrow  Tobacco abuse.  Patient is a heavy smoker  - nicotine patch  Essential hypertension.   BP elevated - Continue with Cozaar, increase to 100 - added amlodipine - cardiology has added metoprolol -Continue with as needed iv labet.  Hypothyroidism. -Continue with home dose of Synthroid  Hyperlipidemia. -Continue with Crestor.  History of gout.  No acute concerns.  History of depression. - given mental status have stopped sertraline  History of tremors. -stop home primidone given encephalopathy  Chronic pain - hold home oxy, have weaned off gabapentin  Objective: Vitals:   04/12/20 0800 04/12/20 0834 04/12/20 0900 04/12/20 1000  BP: (!) 172/67  (!) 141/68 (!) 158/73  Pulse: 94  81 77  Resp: (!) 25  (!) 23 (!) 21  Temp: (!) 96.8 F (36 C)     TempSrc: Axillary     SpO2: 97% 97% 95% 94%  Weight:      Height:         Intake/Output Summary (Last 24 hours) at 04/12/2020 1100 Last data filed at 04/12/2020 1039 Gross per 24 hour  Intake 2350.17 ml  Output 1525 ml  Net 825.17 ml   Filed Weights   04/08/20 0351 04/09/20 0406 04/09/20 1034  Weight: 72.3 kg 69.2 kg 66.9 kg    Examination:  General.  Obese elderly lady, in bed awake, answers simple questions, relatively alert Pulmonary.  Scattered rhonchi CV.  Tachycardic, rr, soft syscolic murmur Abdomen.  Mod distention, no ttp CNS.  Moving all 4 extremities Extremities.  1+ LE edema, bilateral lower extremities with compression bandages. Bruises on arms Psychiatry.  Not agitated  DVT prophylaxis: Lovenox Code Status: Full Family Communication: Daughter was updated @ bedside 12/12 Disposition Plan: TBD Status is: Inpatient  Remains inpatient appropriate because:Inpatient level of care appropriate due to severity of illness   Dispo: The patient is from: Home              Anticipated d/c is to: SNF (has bed, will just need covid test day of discharge)              Anticipated d/c date is: tbd              Patient currently is not medically stable to d/c.    Consultants:   Cardiology  Pulmonology  Nephrology  Procedures:  Antimicrobials:  Ceftriaxone>keflex, finished Zosyn 12/10>  Data Reviewed: I have personally reviewed following labs and imaging studies  CBC: Recent Labs  Lab 04/07/20 0416 04/10/20 0517 04/11/20 0428 04/12/20 0623  WBC 8.6 11.0* 13.9* 16.2*  HGB 14.1 15.9* 15.3* 15.7*  HCT 45.5 51.4* 51.1* 51.5*  MCV 91.9 93.3 94.1 91.8  PLT 177 173 191 883   Basic Metabolic Panel: Recent Labs  Lab 04/08/20 0415 04/09/20 0438 04/10/20 0517 04/10/20 1444 04/11/20 0428 04/12/20 0623  NA 139 143 149* 147* 147* 142  K 3.7 2.9* 2.4* 3.1* 3.3* 3.1*  CL 84* 87* 93* 94* 94* 84*  CO2 37* 39* 40* 38* 39* 44*  GLUCOSE 158* 195* 195* 228* 220* 328*  BUN 25* 26* 39* 42* 40* 37*  CREATININE 0.61 0.58 0.56 0.61 0.62  0.55  CALCIUM 9.6 9.7 9.7 9.2 9.2 9.3  MG 1.8 2.3 2.4  --  2.2 2.2   GFR: Estimated Creatinine Clearance: 57.2 mL/min (by C-G formula based on SCr of 0.55 mg/dL). Liver Function Tests: Recent Labs  Lab 04/09/20 0438 04/10/20 0517 04/10/20 1040 04/11/20 0428 04/12/20 0623  AST 16 15  --  19 25  ALT 18 15  --  16 29  ALKPHOS 81 82  --  74 84  BILITOT 1.9* 1.8* 1.5* 2.1* 2.2*  PROT 7.8 7.5  --  7.0 6.6  ALBUMIN 5.3* 4.9  --  4.4 4.1   Recent Labs  Lab 04/09/20 0438  LIPASE 27   Recent Labs  Lab 04/08/20 1002  AMMONIA 34   Coagulation Profile: Recent Labs  Lab 04/07/20 0756 04/10/20 1040  INR 1.1 1.1   Cardiac Enzymes: No results for input(s): CKTOTAL, CKMB, CKMBINDEX, TROPONINI in the last 168 hours. BNP (last 3 results) No results for input(s): PROBNP in the last 8760 hours. HbA1C: No results for input(s): HGBA1C in the last 72 hours. CBG: Recent Labs  Lab 04/11/20 1218 04/11/20 1614 04/11/20 1647 04/11/20 2209 04/12/20 0743  GLUCAP 235* 268* 288* 254* 312*   Lipid Profile: No results for input(s): CHOL, HDL, LDLCALC, TRIG, CHOLHDL, LDLDIRECT in the last 72 hours. Thyroid Function Tests: No results for input(s): TSH, T4TOTAL, FREET4, T3FREE, THYROIDAB in the last 72 hours. Anemia Panel: No results for input(s): VITAMINB12, FOLATE, FERRITIN, TIBC, IRON, RETICCTPCT in the last 72 hours. Sepsis Labs: Recent Labs  Lab 04/07/20 0416 04/08/20 1222  PROCALCITON <0.10 <0.10    Recent Results (from the past 240 hour(s))  Resp Panel by RT-PCR (Flu A&B, Covid) Nasopharyngeal Swab     Status: None   Collection Time: 04/03/20 11:41 AM   Specimen: Nasopharyngeal Swab; Nasopharyngeal(NP) swabs in vial transport medium  Result Value Ref Range Status   SARS Coronavirus 2 by RT PCR NEGATIVE NEGATIVE Final    Comment: (NOTE) SARS-CoV-2 target nucleic acids are NOT DETECTED.  The SARS-CoV-2 RNA is generally detectable in upper respiratory specimens during the  acute phase of infection. The lowest concentration of SARS-CoV-2 viral copies this assay can detect is 138 copies/mL. A negative result does not preclude SARS-Cov-2 infection and should not be used as the sole basis for treatment or other patient management decisions. A negative result may occur with  improper specimen collection/handling, submission of specimen other than nasopharyngeal swab, presence of viral mutation(s) within the areas targeted by this assay, and inadequate number of viral copies(<138 copies/mL). A negative result must be combined with clinical observations, patient history, and epidemiological information. The expected result  is Negative.  Fact Sheet for Patients:  EntrepreneurPulse.com.au  Fact Sheet for Healthcare Providers:  IncredibleEmployment.be  This test is no t yet approved or cleared by the [place] FDA and  has been authorized for detection and/or diagnosis of SARS-CoV-2 by FDA under an Emergency Use Authorization (EUA). This EUA will remain  in effect (meaning this test can be used) for the duration of the COVID-19 declaration under Section 564(b)(1) of the Act, 21 U.S.C.section 360bbb-3(b)(1), unless the authorization is terminated  or revoked sooner.       Influenza A by PCR NEGATIVE NEGATIVE Final   Influenza B by PCR NEGATIVE NEGATIVE Final    Comment: (NOTE) The Xpert Xpress SARS-CoV-2/FLU/RSV plus assay is intended as an aid in the diagnosis of influenza from Nasopharyngeal swab specimens and should not be used as a sole basis for treatment. Nasal washings and aspirates are unacceptable for Xpert Xpress SARS-CoV-2/FLU/RSV testing.  Fact Sheet for Patients: EntrepreneurPulse.com.au  Fact Sheet for Healthcare Providers: IncredibleEmployment.be  This test is not yet approved or cleared by the [place] FDA and has been authorized for detection and/or  diagnosis of SARS-CoV-2 by FDA under an Emergency Use Authorization (EUA). This EUA will remain in effect (meaning this test can be used) for the duration of the COVID-19 declaration under Section 564(b)(1) of the Act, 21 U.S.C. section 360bbb-3(b)(1), unless the authorization is terminated or revoked.  Performed at [facility], [address] [place], [place] [address]   Culture, blood (Routine X 2) w Reflex to ID Panel     Status: None   Collection Time: 04/03/20  3:27 PM   Specimen: BLOOD  Result Value Ref Range Status   Specimen Description BLOOD BLOOD RIGHT ARM  Final   Special Requests   Final    BOTTLES DRAWN AEROBIC AND ANAEROBIC Blood Culture adequate volume   Culture   Final    NO GROWTH 5 DAYS Performed at [facility], [address] [place], [place] [address]    Report Status 04/08/2020 FINAL  Final  Urine Culture     Status: Abnormal   Collection Time: 04/03/20  4:00 PM   Specimen: Urine, Random  Result Value Ref Range Status   Specimen Description   Final    URINE, RANDOM Performed at [facility], [address] [place], [place] [address]    Special Requests   Final    NONE Performed at [facility], [address], [place], [place] [address]    Culture (A)  Final    >=100,000 COLONIES/mL ESCHERICHIA COLI >=100,000 COLONIES/mL AEROCOCCUS URINAE Standardized susceptibility testing for this organism is not available. Performed at [facility], [place] [address] [place], [place] [address]    Report Status 04/07/2020 FINAL  Final   Organism ID, Bacteria ESCHERICHIA COLI (A)  Final      Susceptibility   Escherichia coli - MIC*    AMPICILLIN 4 SENSITIVE Sensitive     CEFAZOLIN <=4 SENSITIVE Sensitive     CEFEPIME <=0.12 SENSITIVE Sensitive     CEFTRIAXONE <=0.25 SENSITIVE Sensitive     CIPROFLOXACIN <=0.25 SENSITIVE Sensitive     GENTAMICIN <=1 SENSITIVE Sensitive     IMIPENEM <=0.25 SENSITIVE Sensitive      NITROFURANTOIN 32 SENSITIVE Sensitive     TRIMETH/SULFA <=20 SENSITIVE Sensitive     AMPICILLIN/SULBACTAM <=2 SENSITIVE Sensitive     PIP/TAZO <=4 SENSITIVE Sensitive     * >=100,000 COLONIES/mL ESCHERICHIA COLI  Culture, blood (Routine  X 2) w Reflex to ID Panel     Status: None   Collection Time: 04/03/20 10:36 PM   Specimen: BLOOD  Result Value Ref Range Status   Specimen Description BLOOD BLOOD LEFT HAND  Final   Special Requests   Final    BOTTLES DRAWN AEROBIC AND ANAEROBIC Blood Culture adequate volume   Culture   Final    NO GROWTH 5 DAYS Performed at [facility], [address] [place], [place] [address]    Report Status 04/08/2020 FINAL  Final  Urine Culture     Status: None   Collection Time: 04/08/20  9:52 AM   Specimen: Urine, Random  Result Value Ref Range Status   Specimen Description   Final    URINE, RANDOM Performed at [facility], [address], [place], [place] [address]    Special Requests   Final    NONE Performed at [facility], [address], [place], [place] [address]    Culture   Final    NO GROWTH Performed at [facility], [place] [address], [place], [place] [address]    Report Status 04/09/2020 FINAL  Final  CULTURE, BLOOD (ROUTINE X 2) w Reflex to ID Panel     Status: None (Preliminary result)   Collection Time: 04/08/20 10:02 AM   Specimen: BLOOD  Result Value Ref Range Status   Specimen Description BLOOD BLOOD RIGHT HAND  Final   Special Requests   Final    BOTTLES DRAWN AEROBIC AND ANAEROBIC Blood Culture results may not be optimal due to an inadequate volume of blood received in culture bottles   Culture   Final    NO GROWTH 4 DAYS Performed at [facility], [address], [place], [place] [address]    Report Status PENDING  Incomplete  MRSA PCR Screening     Status: None   Collection Time: 04/08/20 10:04 AM   Specimen: Nasal Mucosa; Nasopharyngeal  Result Value Ref Range Status   MRSA by  PCR NEGATIVE NEGATIVE Final    Comment:        The GeneXpert MRSA Assay (FDA approved for NASAL specimens only), is one component of a comprehensive MRSA colonization surveillance program. It is not intended to diagnose MRSA infection nor to guide or monitor treatment for MRSA infections. Performed at [facility], [address] [place], [place] [address]   CULTURE, BLOOD (ROUTINE X 2) w Reflex to ID Panel     Status: None (Preliminary result)   Collection Time: 04/08/20 10:26 AM   Specimen: BLOOD  Result Value Ref Range Status   Specimen Description BLOOD BLOOD RIGHT HAND  Final   Special Requests   Final    BOTTLES DRAWN AEROBIC AND ANAEROBIC Blood Culture results may not be optimal due to an inadequate volume of blood received in culture bottles   Culture   Final    NO GROWTH 4 DAYS Performed at [facility], [address] [place], [place] [address]    Report Status PENDING  Incomplete  Urine Culture     Status: None   Collection Time: 04/10/20  6:21 PM   Specimen: Urine, Random  Result Value Ref Range Status   Specimen Description   Final    URINE, RANDOM Performed at [facility], [address] [place],  [address]    Special Requests   Final    NONE Performed at [facility], [address] [place],  [place] [address]    Culture   Final    NO GROWTH Performed at [facility], [place] [address], [place], [place] [address]    Report Status 04/11/2020 FINAL  Final     Radiology Studies: US Abdomen Limited RUQ (LIVER/GB)  Result Date: 04/11/2020 CLINICAL DATA:  Hyperbilirubinemia in a 74 year old female EXAM: ULTRASOUND ABDOMEN LIMITED RIGHT UPPER QUADRANT COMPARISON:  April 07, 2020 CT angiography of the chest and CT of the chest of April 06, 2020 FINDINGS: Gallbladder: Wall thickness at upper limits of normal. No visible pericholecystic fluid or evidence of cholelithiasis. No reported tenderness over the  gallbladder. Common bile duct: Diameter: 2.2 mm Liver: Mildly heterogeneous echotexture with nodular contour and suggestion of fissural widening particularly on image 22. No visible lesion on submitted images. Portal vein is patent on color Doppler imaging with normal direction of blood flow towards the liver. Other: Trace ascites IMPRESSION: 1. Heterogeneous, mildly heterogeneous hepatic echotexture with question of lobular contour and fissural widening, findings could be seen in the setting of early liver disease. 2. Gallbladder wall thickness at upper limits of normal could be seen in the setting of liver disease and is nonspecific, not associated with biliary calculi, reported tenderness or biliary duct dilation. Electronically Signed   By: Zetta Bills M.D.   On: 04/11/2020 12:23    Scheduled Meds: . amLODipine  10 mg Oral Daily  . vitamin C  1,000 mg Oral Daily  . Chlorhexidine Gluconate Cloth  6 each Topical Daily  . fluticasone  1 spray Each Nare BID  . insulin aspart  0-5 Units Subcutaneous QHS  . insulin aspart  0-9 Units Subcutaneous TID WC  . insulin detemir  10 Units Subcutaneous Daily  . ipratropium-albuterol  3 mL Nebulization TID  . levothyroxine  150 mcg Oral Q0600  . losartan  100 mg Oral Daily  . methylPREDNISolone (SOLU-MEDROL) injection  20 mg Intravenous Q12H  . metoprolol tartrate  50 mg Oral BID  . potassium chloride  40 mEq Oral BID  . rosuvastatin  5 mg Oral Daily  . sodium chloride flush  3 mL Intravenous Q12H  . vitamin B-12  1,000 mcg Oral Daily   Continuous Infusions: . sodium chloride Stopped (04/12/20 0852)  . piperacillin-tazobactam (ZOSYN)  IV 12.5 mL/hr at 04/12/20 1000     LOS: 9 days   Time spent: 35 minutes  Desma Maxim, MD [facility]  If 7PM-7AM, please contact night-coverage [email or web address]  04/12/2020, 11:00 AM

## 2020-04-12 NOTE — Progress Notes (Signed)
[facility]  ROUNDING NOTE   Subjective:   Daughter at bedside. Patient more alert and able to answer questions.   UOP 925 - with in and out catheterization. Foley catheter to be placed.     Objective:  Vital signs in last 24 hours:  Temp:  [96.8 F (36 C)-98.7 F (37.1 C)] 98 F (36.7 C) (12/14 1200) Pulse Rate:  [58-128] 86 (12/14 1200) Resp:  [16-25] 20 (12/14 1200) BP: (122-178)/(61-108) 171/77 (12/14 1200) SpO2:  [93 %-100 %] 94 % (12/14 1200)  Weight change:  Filed Weights   04/08/20 0351 04/09/20 0406 04/09/20 1034  Weight: 72.3 kg 69.2 kg 66.9 kg    Intake/Output: I/O last 3 completed shifts: In: 2115.1 [I.V.:1885.9; IV Piggyback:229.3] Out: 1675 [PZWCH:8527]   Intake/Output this shift:  Total I/O In: 290.8 [P.O.:60; I.V.:155.8; IV Piggyback:75] Out: 700 [Urine:700]  Physical Exam: General:  Ill appearing  Head: [place]/AT  Eyes:  Anicteric  Lungs:   bilateral crackles at bases  Heart:  regular  Abdomen:   Soft, nontender, nondistended  Extremities:  No peripheral edema  Neurologic:  Lethargic  Skin:  no tenting    Basic Metabolic Panel: Recent Labs  Lab 04/08/20 0415 04/09/20 0438 04/10/20 0517 04/10/20 1444 04/11/20 0428 04/12/20 0623  NA 139 143 149* 147* 147* 142  K 3.7 2.9* 2.4* 3.1* 3.3* 3.1*  CL 84* 87* 93* 94* 94* 84*  CO2 37* 39* 40* 38* 39* 44*  GLUCOSE 158* 195* 195* 228* 220* 328*  BUN 25* 26* 39* 42* 40* 37*  CREATININE 0.61 0.58 0.56 0.61 0.62 0.55  CALCIUM 9.6 9.7 9.7 9.2 9.2 9.3  MG 1.8 2.3 2.4  --  2.2 2.2    Liver Function Tests: Recent Labs  Lab 04/09/20 0438 04/10/20 0517 04/10/20 1040 04/11/20 0428 04/12/20 0623  AST 16 15  --  19 25  ALT 18 15  --  16 29  ALKPHOS 81 82  --  74 84  BILITOT 1.9* 1.8* 1.5* 2.1* 2.2*  PROT 7.8 7.5  --  7.0 6.6  ALBUMIN 5.3* 4.9  --  4.4 4.1   Recent Labs  Lab 04/09/20 0438  LIPASE 27   Recent Labs  Lab 04/08/20 1002  AMMONIA 34    CBC: Recent Labs  Lab  04/07/20 0416 04/10/20 0517 04/11/20 0428 04/12/20 0623  WBC 8.6 11.0* 13.9* 16.2*  HGB 14.1 15.9* 15.3* 15.7*  HCT 45.5 51.4* 51.1* 51.5*  MCV 91.9 93.3 94.1 91.8  PLT 177 173 191 179    Cardiac Enzymes: No results for input(s): CKTOTAL, CKMB, CKMBINDEX, TROPONINI in the last 168 hours.  BNP: Invalid input(s): POCBNP  CBG: Recent Labs  Lab 04/11/20 1614 04/11/20 1647 04/11/20 2209 04/12/20 0743 04/12/20 1111  GLUCAP 268* 288* 254* 312* 303*    Microbiology: Results for orders placed or performed during the hospital encounter of 04/03/20  Resp Panel by RT-PCR (Flu A&B, Covid) Nasopharyngeal Swab     Status: None   Collection Time: 04/03/20 11:41 AM   Specimen: Nasopharyngeal Swab; Nasopharyngeal(NP) swabs in vial transport medium  Result Value Ref Range Status   SARS Coronavirus 2 by RT PCR NEGATIVE NEGATIVE Final    Comment: (NOTE) SARS-CoV-2 target nucleic acids are NOT DETECTED.  The SARS-CoV-2 RNA is generally detectable in upper respiratory specimens during the acute phase of infection. The lowest concentration of SARS-CoV-2 viral copies this assay can detect is 138 copies/mL. A negative result does not preclude SARS-Cov-2 infection and should not  be used as the sole basis for treatment or other patient management decisions. A negative result may occur with  improper specimen collection/handling, submission of specimen other than nasopharyngeal swab, presence of viral mutation(s) within the areas targeted by this assay, and inadequate number of viral copies(<138 copies/mL). A negative result must be combined with clinical observations, patient history, and epidemiological information. The expected result is Negative.  Fact Sheet for Patients:  EntrepreneurPulse.com.au  Fact Sheet for Healthcare Providers:  IncredibleEmployment.be  This test is no t yet approved or cleared by the [place] FDA and  has been  authorized for detection and/or diagnosis of SARS-CoV-2 by FDA under an Emergency Use Authorization (EUA). This EUA will remain  in effect (meaning this test can be used) for the duration of the COVID-19 declaration under Section 564(b)(1) of the Act, 21 U.S.C.section 360bbb-3(b)(1), unless the authorization is terminated  or revoked sooner.       Influenza A by PCR NEGATIVE NEGATIVE Final   Influenza B by PCR NEGATIVE NEGATIVE Final    Comment: (NOTE) The Xpert Xpress SARS-CoV-2/FLU/RSV plus assay is intended as an aid in the diagnosis of influenza from Nasopharyngeal swab specimens and should not be used as a sole basis for treatment. Nasal washings and aspirates are unacceptable for Xpert Xpress SARS-CoV-2/FLU/RSV testing.  Fact Sheet for Patients: EntrepreneurPulse.com.au  Fact Sheet for Healthcare Providers: IncredibleEmployment.be  This test is not yet approved or cleared by the [place] FDA and has been authorized for detection and/or diagnosis of SARS-CoV-2 by FDA under an Emergency Use Authorization (EUA). This EUA will remain in effect (meaning this test can be used) for the duration of the COVID-19 declaration under Section 564(b)(1) of the Act, 21 U.S.C. section 360bbb-3(b)(1), unless the authorization is terminated or revoked.  Performed at [facility], [address] [place], [place] [address]   Culture, blood (Routine X 2) w Reflex to ID Panel     Status: None   Collection Time: 04/03/20  3:27 PM   Specimen: BLOOD  Result Value Ref Range Status   Specimen Description BLOOD BLOOD RIGHT ARM  Final   Special Requests   Final    BOTTLES DRAWN AEROBIC AND ANAEROBIC Blood Culture adequate volume   Culture   Final    NO GROWTH 5 DAYS Performed at [facility], [address], [place], [place] [address]    Report Status 04/08/2020 FINAL  Final  Urine Culture     Status: Abnormal   Collection Time:  04/03/20  4:00 PM   Specimen: Urine, Random  Result Value Ref Range Status   Specimen Description   Final    URINE, RANDOM Performed at [facility], [address], [place], [place] [address]    Special Requests   Final    NONE Performed at [facility], [address] [place], [place] [address]    Culture (A)  Final    >=100,000 COLONIES/mL ESCHERICHIA COLI >=100,000 COLONIES/mL AEROCOCCUS URINAE Standardized susceptibility testing for this organism is not available. Performed at [facility], [place] [address], [place], [place] [address]    Report Status 04/07/2020 FINAL  Final   Organism ID, Bacteria ESCHERICHIA COLI (A)  Final      Susceptibility   Escherichia coli - MIC*    AMPICILLIN 4 SENSITIVE Sensitive     CEFAZOLIN <=4 SENSITIVE Sensitive     CEFEPIME <=0.12 SENSITIVE Sensitive     CEFTRIAXONE <=0.25 SENSITIVE Sensitive  CIPROFLOXACIN <=0.25 SENSITIVE Sensitive     GENTAMICIN <=1 SENSITIVE Sensitive     IMIPENEM <=0.25 SENSITIVE Sensitive     NITROFURANTOIN 32 SENSITIVE Sensitive     TRIMETH/SULFA <=20 SENSITIVE Sensitive     AMPICILLIN/SULBACTAM <=2 SENSITIVE Sensitive     PIP/TAZO <=4 SENSITIVE Sensitive     * >=100,000 COLONIES/mL ESCHERICHIA COLI  Culture, blood (Routine X 2) w Reflex to ID Panel     Status: None   Collection Time: 04/03/20 10:36 PM   Specimen: BLOOD  Result Value Ref Range Status   Specimen Description BLOOD BLOOD LEFT HAND  Final   Special Requests   Final    BOTTLES DRAWN AEROBIC AND ANAEROBIC Blood Culture adequate volume   Culture   Final    NO GROWTH 5 DAYS Performed at [facility], [address] [place], [place] [address]    Report Status 04/08/2020 FINAL  Final  Urine Culture     Status: None   Collection Time: 04/08/20  9:52 AM   Specimen: Urine, Random  Result Value Ref Range Status   Specimen Description   Final    URINE, RANDOM Performed at [facility], [address]  [place], [place] [address]    Special Requests   Final    NONE Performed at [facility], [address], [place], [place] [address]    Culture   Final    NO GROWTH Performed at [facility], [place] [address], [place], [place] [address]    Report Status 04/09/2020 FINAL  Final  CULTURE, BLOOD (ROUTINE X 2) w Reflex to ID Panel     Status: None (Preliminary result)   Collection Time: 04/08/20 10:02 AM   Specimen: BLOOD  Result Value Ref Range Status   Specimen Description BLOOD BLOOD RIGHT HAND  Final   Special Requests   Final    BOTTLES DRAWN AEROBIC AND ANAEROBIC Blood Culture results may not be optimal due to an inadequate volume of blood received in culture bottles   Culture   Final    NO GROWTH 4 DAYS Performed at [facility], [address], [place], [place] [address]    Report Status PENDING  Incomplete  MRSA PCR Screening     Status: None   Collection Time: 04/08/20 10:04 AM   Specimen: Nasal Mucosa; Nasopharyngeal  Result Value Ref Range Status   MRSA by PCR NEGATIVE NEGATIVE Final    Comment:        The GeneXpert MRSA Assay (FDA approved for NASAL specimens only), is one component of a comprehensive MRSA colonization surveillance program. It is not intended to diagnose MRSA infection nor to guide or monitor treatment for MRSA infections. Performed at [facility], [address] [place],  [address]   CULTURE, BLOOD (ROUTINE X 2) w Reflex to ID Panel     Status: None (Preliminary result)   Collection Time: 04/08/20 10:26 AM   Specimen: BLOOD  Result Value Ref Range Status   Specimen Description BLOOD BLOOD RIGHT HAND  Final   Special Requests   Final    BOTTLES DRAWN AEROBIC AND ANAEROBIC Blood Culture results may not be optimal due to an inadequate volume of blood received in culture bottles   Culture   Final    NO GROWTH 4 DAYS Performed at [facility], [address] [place],  [address]    Report  Status PENDING  Incomplete  Urine Culture     Status:  None   Collection Time: 04/10/20  6:21 PM   Specimen: Urine, Random  Result Value Ref Range Status   Specimen Description   Final    URINE, RANDOM Performed at [facility], [address] [place], [place] [address]    Special Requests   Final    NONE Performed at [facility], [address], [place], [place] [address]    Culture   Final    NO GROWTH Performed at [facility], [place] [address] [place],  [address]    Report Status 04/11/2020 FINAL  Final    Coagulation Studies: Recent Labs    04/10/20 1040  LABPROT 14.2  INR 1.1    Urinalysis: Recent Labs    04/10/20 1821  COLORURINE YELLOW*  LABSPEC 1.017  PHURINE 6.0  GLUCOSEU 50*  HGBUR MODERATE*  BILIRUBINUR NEGATIVE  KETONESUR NEGATIVE  PROTEINUR >=300*  NITRITE NEGATIVE  LEUKOCYTESUR TRACE*      Imaging: US Abdomen Limited RUQ (LIVER/GB)  Result Date: 04/11/2020 CLINICAL DATA:  Hyperbilirubinemia in a 74 year old female EXAM: ULTRASOUND ABDOMEN LIMITED RIGHT UPPER QUADRANT COMPARISON:  April 07, 2020 CT angiography of the chest and CT of the chest of April 06, 2020 FINDINGS: Gallbladder: Wall thickness at upper limits of normal. No visible pericholecystic fluid or evidence of cholelithiasis. No reported tenderness over the gallbladder. Common bile duct: Diameter: 2.2 mm Liver: Mildly heterogeneous echotexture with nodular contour and suggestion of fissural widening particularly on image 22. No visible lesion on submitted images. Portal vein is patent on color Doppler imaging with normal direction of blood flow towards the liver. Other: Trace ascites IMPRESSION: 1. Heterogeneous, mildly heterogeneous hepatic echotexture with question of lobular contour and fissural widening, findings could be seen in the setting of early liver disease. 2. Gallbladder wall thickness at upper limits of normal could be seen in the setting of  liver disease and is nonspecific, not associated with biliary calculi, reported tenderness or biliary duct dilation. Electronically Signed   By: Zetta Bills M.D.   On: 04/11/2020 12:23     Medications:   . sodium chloride Stopped (04/12/20 1143)  . levothyroxine    . piperacillin-tazobactam (ZOSYN)  IV 12.5 mL/hr at 04/12/20 1200  . potassium chloride 100 mL/hr at 04/12/20 1200   . amLODipine  10 mg Oral Daily  . vitamin C  1,000 mg Oral Daily  . Chlorhexidine Gluconate Cloth  6 each Topical Daily  . fluticasone  1 spray Each Nare BID  . insulin aspart  0-5 Units Subcutaneous QHS  . insulin aspart  0-9 Units Subcutaneous TID WC  . [START ON 04/13/2020] insulin glargine  20 Units Subcutaneous Daily  . ipratropium-albuterol  3 mL Nebulization TID  . losartan  100 mg Oral Daily  . methylPREDNISolone (SOLU-MEDROL) injection  20 mg Intravenous Q12H  . metoprolol tartrate  50 mg Oral BID  . potassium chloride  40 mEq Oral BID  . rosuvastatin  5 mg Oral Daily  . sodium chloride flush  3 mL Intravenous Q12H  . vitamin B-12  1,000 mcg Oral Daily   sodium chloride, acetaminophen, acetaminophen, albuterol, dextromethorphan-guaiFENesin, fluticasone, labetalol, nitroGLYCERIN, ondansetron (ZOFRAN) IV, sodium chloride flush  Assessment/ Plan:  74 y.o. female  [name] is a 74 y.o. 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  #Proteinuria, 17 g.   # Nephrotic syndrome with anasarca.   -  will need renal biopsy in future - currently hypovolemic on examination - Discontinue IV fluids - Hold diuretics, encourage PO intake - indwelling foley catheter ordered.  - Stress dose steroids.   #Hypokalemia - secondary to loop diuretics and renal losses.  - replace with potassium chloride.   #Metabolic alkalosis with compensated respiratory  acidosis and with metabolic acidosis  #Acute hypoxic respiratory failure  With right lower lobe pneumonia. Empiric antibiotics and systemic steroids.  Leukocytosis trending up.   #Hypertension Elevated. On losartan, metoprolol, and amlodipine  #Diabetes mellitus type 2 Lab Results  Component Value Date   HGBA1C 6.5 (H) 04/03/2020   Continue glucose control.     LOS: 9 Martha [name] 12/14/202112:11 PM

## 2020-04-12 NOTE — Consult Note (Signed)
Pharmacy Antibiotic Note  Martha [name] is a 74 y.o. female admitted on 04/03/2020 with bilateral leg edemaand pain,and SOB.  Pharmacy has been consulted for Vancomycin dosing for HAP. Patient with recent ceftriaxone treatment for UTI and current Zosyn treatment (day 5) for aspiration PNA.   Plan:  Goal trough 15-20  1500 mg IV Vancomycin LD x 1, followed by maintenance dose of 500 mg q 12 h   Will check MRSA PCR  Daily BMP while on Vanco/Zosyn therapy      Height: 5\' 4"  (162.6 cm) Weight: 66.9 kg (147 lb 7.8 oz) IBW/kg (Calculated) : 54.7  Temp (24hrs), Avg:98 F (36.7 C), Min:96.8 F (36 C), Max:98.7 F (37.1 C)  Recent Labs  Lab 04/07/20 0416 04/08/20 0415 04/09/20 0438 04/10/20 0517 04/10/20 1444 04/11/20 0428 04/12/20 0623  WBC 8.6  --   --  11.0*  --  13.9* 16.2*  CREATININE 0.65   < > 0.58 0.56 0.61 0.62 0.55   < > = values in this interval not displayed.    Estimated Creatinine Clearance: 57.2 mL/min (by C-G formula based on SCr of 0.55 mg/dL).    Allergies  Allergen Reactions  . Nicotine Polacrilex Cough    Onset 04/29/2006.  Marland Kitchen Nicotrol [Nicotine] Cough  . Codeine Sulfate Itching    Onset 11/24/1998. tingling    Antimicrobials this admission: Ceftriaxone  >> 12/5 >12/8 Keflex >> 12/9 Zosyn >> 12/10 >>  Dose adjustments this admission: n/a  Microbiology results: 12/10 BCx: NG 12/13 UCx: NG  12/14 Sputum: In process  12/14 MRSA PCR: Pending  Thank you for allowing pharmacy to be a part of this patient's care.  Dorothe Pea, PharmD, BCPS 04/12/2020 2:16 PM

## 2020-04-13 LAB — COMPREHENSIVE METABOLIC PANEL
ALT: 27 U/L (ref 0–44)
AST: 18 U/L (ref 15–41)
Albumin: 3.8 g/dL (ref 3.5–5.0)
Alkaline Phosphatase: 77 U/L (ref 38–126)
Anion gap: 11 (ref 5–15)
BUN: 30 mg/dL — ABNORMAL HIGH (ref 8–23)
CO2: 35 mmol/L — ABNORMAL HIGH (ref 22–32)
Calcium: 8.9 mg/dL (ref 8.9–10.3)
Chloride: 95 mmol/L — ABNORMAL LOW (ref 98–111)
Creatinine, Ser: 0.55 mg/dL (ref 0.44–1.00)
GFR, Estimated: >60 mL/min (ref >60)
Glucose, Bld: 206 mg/dL — ABNORMAL HIGH (ref 70–99)
Potassium: 4.1 mmol/L (ref 3.5–5.1)
Sodium: 141 mmol/L (ref 135–145)
Total Bilirubin: 1.6 mg/dL — ABNORMAL HIGH (ref 0.3–1.2)
Total Protein: 6.3 g/dL — ABNORMAL LOW (ref 6.5–8.1)

## 2020-04-13 LAB — CBC
HCT: 48.3 % — ABNORMAL HIGH (ref 36.0–46.0)
Hemoglobin: 14.7 g/dL (ref 12.0–15.0)
MCH: 28 pg (ref 26.0–34.0)
MCHC: 30.4 g/dL (ref 30.0–36.0)
MCV: 92 fL (ref 80.0–100.0)
Platelets: 186 10*3/uL (ref 150–400)
RBC: 5.25 MIL/uL — ABNORMAL HIGH (ref 3.87–5.11)
RDW: 16.6 % — ABNORMAL HIGH (ref 11.5–15.5)
WBC: 16.6 10*3/uL — ABNORMAL HIGH (ref 4.0–10.5)
nRBC: 0 % (ref 0.0–0.2)

## 2020-04-13 LAB — CULTURE, BLOOD (ROUTINE X 2)
Culture: NO GROWTH
Culture: NO GROWTH

## 2020-04-13 LAB — MAGNESIUM: Magnesium: 2 mg/dL (ref 1.7–2.4)

## 2020-04-13 LAB — GLUCOSE, CAPILLARY
Glucose-Capillary: 350 mg/dL — ABNORMAL HIGH (ref 70–99)
Glucose-Capillary: 149 mg/dL — ABNORMAL HIGH (ref 70–99)
Glucose-Capillary: 127 mg/dL — ABNORMAL HIGH (ref 70–99)
Glucose-Capillary: 246 mg/dL — ABNORMAL HIGH (ref 70–99)

## 2020-04-13 MED ORDER — SERTRALINE HCL 50 MG PO TABS
50.0000 mg | ORAL_TABLET | Freq: Every day | ORAL | Status: DC
  Administered 2020-04-14 – 2020-04-16 (×3): 50 mg via ORAL
  Filled 2020-04-13 (×4): qty 1

## 2020-04-13 MED ORDER — PANTOPRAZOLE SODIUM 40 MG PO TBEC
40.0000 mg | DELAYED_RELEASE_TABLET | Freq: Every day | ORAL | Status: DC
  Administered 2020-04-13 – 2020-04-21 (×8): 40 mg via ORAL
  Filled 2020-04-13 (×8): qty 1

## 2020-04-13 MED ORDER — ALUM & MAG HYDROXIDE-SIMETH 200-200-20 MG/5ML PO SUSP
30.0000 mL | Freq: Once | ORAL | Status: AC
  Administered 2020-04-13: 23:00:00 30 mL via ORAL
  Filled 2020-04-13: qty 30

## 2020-04-13 MED ORDER — ADULT MULTIVITAMIN W/MINERALS CH
1.0000 | ORAL_TABLET | Freq: Every day | ORAL | Status: DC
  Administered 2020-04-14 – 2020-04-18 (×5): 1 via ORAL
  Filled 2020-04-13 (×6): qty 1

## 2020-04-13 MED ORDER — PRIMIDONE 50 MG PO TABS
50.0000 mg | ORAL_TABLET | Freq: Two times a day (BID) | ORAL | Status: DC
  Administered 2020-04-13 – 2020-04-21 (×16): 50 mg via ORAL
  Filled 2020-04-13 (×17): qty 1

## 2020-04-13 MED ORDER — PREDNISONE 20 MG PO TABS
40.0000 mg | ORAL_TABLET | Freq: Every day | ORAL | Status: DC
  Administered 2020-04-14 – 2020-04-17 (×4): 40 mg via ORAL
  Filled 2020-04-13 (×4): qty 2

## 2020-04-13 MED ORDER — GLUCERNA SHAKE PO LIQD
237.0000 mL | Freq: Three times a day (TID) | ORAL | Status: DC
  Administered 2020-04-13 – 2020-04-16 (×9): 237 mL via ORAL
  Administered 2020-04-17: 11:00:00 115 mL via ORAL
  Administered 2020-04-19: 09:00:00 237 mL via ORAL

## 2020-04-13 MED ORDER — CALCIUM CARBONATE ANTACID 500 MG PO CHEW
1.0000 | CHEWABLE_TABLET | Freq: Three times a day (TID) | ORAL | Status: DC | PRN
  Administered 2020-04-13 – 2020-04-14 (×2): 200 mg via ORAL
  Filled 2020-04-13 (×2): qty 1

## 2020-04-13 MED ORDER — FUROSEMIDE 20 MG PO TABS
20.0000 mg | ORAL_TABLET | Freq: Every day | ORAL | Status: DC
  Administered 2020-04-13: 12:00:00 20 mg via ORAL
  Filled 2020-04-13: qty 1

## 2020-04-13 NOTE — Progress Notes (Signed)
PROGRESS NOTE    Martha [name]   IWP:[id number]  DOB: April 10, 1946  PCP: Tracie Harrier, MD    DOA: 04/03/2020 LOS: 10   Brief Narrative   From H&P: "Martha [name] is a 74 y.o. female with medical history significant of  HTN, HLD, DM, COPD, tobacco abuse, hypothyroidism, gout, depression, tremor, proteinuria, depression, anxiety, GERD, diverticulitis, who presents with bilateral leg edema and pain, and SOB.   Patient states that she has chronic bilateral leg edema which has been progressively worsened recently. Both lower legs are painful and erythematous. She states that she gets her lower extremities wrapped once a week.  She has chronic cough and shortness of breath which has slightly worsened on exertion.  She coughs up some white mucus.  No fever or chills.  No chest pain.  On presentation she was hypoxic requiring BiPAP, later transitioned to 4 L of oxygen. Patient also has an history of proteinuria which is being worked up by nephrology as an outpatient and they are planning for biopsy.   Patient had pertinent labs positive for BNP of 1113, troponin and COVID-19 negative.  Chest x-ray with borderline cardiomegaly and mild pulmonary edema.  Significant edema of bilateral lower extremities with signs of venous congestion, skin peeling and few shallow ulcers.  Cardiology was consulted."  Pt required Bipap again and close monitoring in step down unit on 12/11.  Was resumed on antibiotics for suspected aspiration pneumonia and steroids.     Assessment & Plan   Principal Problem:   Acute CHF (congestive heart failure) (HCC) Active Problems:   COPD exacerbation (HCC)   Diabetes mellitus without complication ([place])   Current tobacco use   Hyperlipidemia   Essential hypertension   Hypothyroidism   Gout   Depression   Tremors of nervous system   Acute respiratory failure with hypoxia (HCC)   Cellulitis of lower extremity   Acute pulmonary edema (HCC)   Elevated brain  natriuretic peptide (BNP) level   Nephrotic syndrome  Acute respiratory failure with hypoxia secondary to combination of acute on chronic diastolic CHF, COPD exacerbation, right lower lobe pneumonia?  Aspiration -was aggressively diuresed earlier this admission.  CT chest on 12/8 obtained for hemoptysis, showed atelectasis versus multifocal pneumonia.  Covid was negative on admission.  CTA later negative for PE.  Unlikely ACS as troponins very mildly elevated and flat. --Off Lasix and albumin --Supplemental O2 as needed, maintain O2 sat greater than 88%, wean as tolerated --ID and pulmonology following --Continue Unasyn --Continue IV steroids --Follow blood cultures, negative to date  Dysphagia -12/14 nursing reported patient gagging and choking with medications.  Concern for aspiration pneumonia.  SLP consulted for swallow eval.  Currently on dysphagia 3 diet.  Acute metabolic encephalopathy -improved after BiPAP.  Patient is awake, lucid and appropriately responding to questions.  Likely multifactorial with COPD, pneumonia, uremia urinary retention contributing. --Continue antibiotics and steroids as above --Monitor  Abdominal pain /diarrhea - diarrhea has resolved.  No need to check C. difficile, can DC precautions. --Monitor abdominal pain, diet resumed per SLP dysphagia 3  Hyperbilirubinemia -elevated indirect bili, normal LFTs.  Peripheral smear, LDH, CBC without evidence of hemolysis.  RUQ ultrasound unrevealing.  Suspect due to acute illnesses.  Urinary retention -Foley was placed  Hemoptysis -resolved.  Patient had small volume blood-tinged sputum.  Suspect secondary to coughing episodes.  CTA chest showed atelectasis versus infiltrates and was negative for PE.  Nephrotic syndrome with proteinuria - Plan for kidney biopsy once recovered  from acute illness.  Previously on Lasix infusion this admission. --Nephrology following --Currently off diuretic  Bilateral lower extremity  venous stasis ulcers -with bilateral Unna boots in place.  Wound care consulted.  Was treated with 5 days Rocephin/Keflex.  Continue compression therapy  Bacteriuria -patient without UTI symptoms (was unable to provide history at the time), UA was positive for infection and culture grew pansensitive E. coli.  She completed 5 days of Rocephin and Keflex.  Hypokalemia -replaced.  Monitor BMP and replace as needed  Type 2 diabetes -A1c 6.5%.  Takes Metformin and glipizide at home.  Continue sliding scale NovoLog and basal insulin.  Tobacco abuse -patient is ongoing heavy smoker.  Nicotine patch ordered.  Essential hypertension -patient's losartan was increased to 100 mg.  She was started on amlodipine.  Cardiology also added metoprolol.  Labetalol IV as needed.  Hypothyroidism -continue Synthroid  Hyperlipidemia -continue Crestor  History of gout -no acute issues  History of depression -takes Zoloft which was held due to mental status changes.  Resume Zoloft tomorrow, 12/16.  History of tremors -Home primidone has been held due to mental status changes.  Resume primidone this evening, 12/15.  Chronic pain - home oxycodone is held, was weaned off gabapentin earlier this admission due to encephalopathy.   Patient BMI: Body mass index is 25.9 kg/m.   DVT prophylaxis:    Diet:  Diet Orders (From admission, onward)    Start     Ordered   04/13/20 1205  DIET DYS 2 Room service appropriate? Yes with Assist; Fluid consistency: Thin  Diet effective now       Comments: Please add extra Gravy on meats, potatoes. Yogurt at breakfast/lunch meals. Puddings, jello.  Question Answer Comment  Room service appropriate? Yes with Assist   Fluid consistency: Thin      04/13/20 1205            Code Status: Full Code    Subjective 04/13/20    Patient seen this morning at bedside.  She denies shortness of breath.  Says she has a little phlegm with cough.  Asked if she is able to have a drink  yet.  Denies any further diarrhea.  Currently no abdominal pain.   Disposition Plan & Communication   Status is: Inpatient  Remains inpatient appropriate because:IV treatments appropriate due to intensity of illness or inability to take PO   Dispo: The patient is from: Home              Anticipated d/c is to: SNF              Anticipated d/c date is: 2 days              Patient currently is not medically stable to d/c.   Family Communication: None at bedside, will attempt to call   Consults, Procedures, Significant Events   Consultants:   Nephrology  Cardiology  Infectious disease  Pulmonology  Procedures:   None  Antimicrobials:  Anti-infectives (From admission, onward)   Start     Dose/Rate Route Frequency Ordered Stop   04/13/20 0330  vancomycin (VANCOREADY) IVPB 500 mg/100 mL  Status:  Discontinued        500 mg 100 mL/hr over 60 Minutes Intravenous Every 12 hours 04/12/20 1458 04/12/20 1715   04/12/20 1815  ampicillin-sulbactam (UNASYN) 1.5 g in sodium chloride 0.9 % 100 mL IVPB        1.5 g 200 mL/hr over 30 Minutes Intravenous Every  6 hours 04/12/20 1715     04/12/20 1515  vancomycin (VANCOREADY) IVPB 1500 mg/300 mL  Status:  Discontinued        1,500 mg 150 mL/hr over 120 Minutes Intravenous  Once 04/12/20 1428 04/12/20 1715   04/08/20 2200  vancomycin (VANCOREADY) IVPB 750 mg/150 mL  Status:  Discontinued        750 mg 150 mL/hr over 60 Minutes Intravenous Every 12 hours 04/08/20 1005 04/08/20 1318   04/08/20 1100  vancomycin (VANCOREADY) IVPB 1750 mg/350 mL  Status:  Discontinued        1,750 mg 175 mL/hr over 120 Minutes Intravenous  Once 04/08/20 1005 04/08/20 1318   04/08/20 1100  azithromycin (ZITHROMAX) tablet 500 mg        500 mg Oral Daily 04/08/20 1005 04/11/20 0959   04/08/20 1015  piperacillin-tazobactam (ZOSYN) IVPB 3.375 g  Status:  Discontinued        3.375 g 12.5 mL/hr over 240 Minutes Intravenous Every 8 hours 04/08/20 1005 04/12/20  1715   04/07/20 0600  cephALEXin (KEFLEX) capsule 500 mg        500 mg Oral Every 8 hours 04/06/20 1146 04/07/20 2121   04/05/20 1000  cefTRIAXone (ROCEPHIN) 2 g in sodium chloride 0.9 % 100 mL IVPB  Status:  Discontinued        2 g 200 mL/hr over 30 Minutes Intravenous Every 24 hours 04/04/20 1525 04/06/20 1146   04/04/20 1100  cefTRIAXone (ROCEPHIN) 2 g in sodium chloride 0.9 % 100 mL IVPB  Status:  Discontinued        2 g 200 mL/hr over 30 Minutes Intravenous Every 24 hours 04/04/20 1023 04/04/20 1510   04/03/20 1500  cefTRIAXone (ROCEPHIN) 1 g in sodium chloride 0.9 % 100 mL IVPB  Status:  Discontinued        1 g 200 mL/hr over 30 Minutes Intravenous Every 24 hours 04/03/20 1445 04/04/20 1023         Objective   Vitals:   04/13/20 0530 04/13/20 0806 04/13/20 0837 04/13/20 1240  BP: (!) 149/60  (!) 147/71 (!) 158/56  Pulse: 86  82 65  Resp:   20 20  Temp: 98.9 F (37.2 C)  98.5 F (36.9 C) 98.6 F (37 C)  TempSrc: Oral  Oral Oral  SpO2: (!) 60% 92% (!) 89% (!) 89%  Weight:      Height:        Intake/Output Summary (Last 24 hours) at 04/13/2020 1444 Last data filed at 04/13/2020 9562 Gross per 24 hour  Intake 365.92 ml  Output 1070 ml  Net -704.08 ml   Filed Weights   04/09/20 0406 04/09/20 1034 04/13/20 0448  Weight: 69.2 kg 66.9 kg 68.4 kg    Physical Exam:  General exam: awake, alert, no acute distress Respiratory system: Right side rhonchi, left side diminished aeration, normal respiratory effort, on 10 L/min HFNC. Cardiovascular system: normal S1/S2, RRR, no pedal edema.   Gastrointestinal system: soft, NT, ND, no HSM felt, +bowel sounds. Central nervous system: no gross focal neurologic deficits, normal speech Extremities: Compression wraps on bilateral lower extremities, normal tone Psychiatry: normal mood, congruent affect  Labs   Data Reviewed: I have personally reviewed following labs and imaging studies  CBC: Recent Labs  Lab 04/07/20 0416  04/10/20 0517 04/11/20 0428 04/12/20 0623 04/13/20 0441  WBC 8.6 11.0* 13.9* 16.2* 16.6*  HGB 14.1 15.9* 15.3* 15.7* 14.7  HCT 45.5 51.4* 51.1* 51.5* 48.3*  MCV 91.9  93.3 94.1 91.8 92.0  PLT 177 173 191 179 329   Basic Metabolic Panel: Recent Labs  Lab 04/09/20 0438 04/10/20 0517 04/10/20 1444 04/11/20 0428 04/12/20 0623 04/13/20 0441  NA 143 149* 147* 147* 142 141  K 2.9* 2.4* 3.1* 3.3* 3.1* 4.1  CL 87* 93* 94* 94* 84* 95*  CO2 39* 40* 38* 39* 44* 35*  GLUCOSE 195* 195* 228* 220* 328* 206*  BUN 26* 39* 42* 40* 37* 30*  CREATININE 0.58 0.56 0.61 0.62 0.55 0.55  CALCIUM 9.7 9.7 9.2 9.2 9.3 8.9  MG 2.3 2.4  --  2.2 2.2 2.0   GFR: Estimated Creatinine Clearance: 57.7 mL/min (by C-G formula based on SCr of 0.55 mg/dL). Liver Function Tests: Recent Labs  Lab 04/09/20 0438 04/10/20 0517 04/10/20 1040 04/11/20 0428 04/12/20 0623 04/13/20 0441  AST 16 15  --  19 25 18   ALT 18 15  --  16 29 27   ALKPHOS 81 82  --  74 84 77  BILITOT 1.9* 1.8* 1.5* 2.1* 2.2* 1.6*  PROT 7.8 7.5  --  7.0 6.6 6.3*  ALBUMIN 5.3* 4.9  --  4.4 4.1 3.8   Recent Labs  Lab 04/09/20 0438  LIPASE 27   Recent Labs  Lab 04/08/20 1002  AMMONIA 34   Coagulation Profile: Recent Labs  Lab 04/07/20 0756 04/10/20 1040  INR 1.1 1.1   Cardiac Enzymes: No results for input(s): CKTOTAL, CKMB, CKMBINDEX, TROPONINI in the last 168 hours. BNP (last 3 results) No results for input(s): PROBNP in the last 8760 hours. HbA1C: No results for input(s): HGBA1C in the last 72 hours. CBG: Recent Labs  Lab 04/12/20 1626 04/12/20 1712 04/12/20 2044 04/13/20 0828 04/13/20 1241  GLUCAP 175* 200* 207* 350* 149*   Lipid Profile: No results for input(s): CHOL, HDL, LDLCALC, TRIG, CHOLHDL, LDLDIRECT in the last 72 hours. Thyroid Function Tests: No results for input(s): TSH, T4TOTAL, FREET4, T3FREE, THYROIDAB in the last 72 hours. Anemia Panel: No results for input(s): VITAMINB12, FOLATE, FERRITIN, TIBC,  IRON, RETICCTPCT in the last 72 hours. Sepsis Labs: Recent Labs  Lab 04/07/20 0416 04/08/20 1222 04/12/20 0621  PROCALCITON <0.10 <0.10 <0.10    Recent Results (from the past 240 hour(s))  Culture, blood (Routine X 2) w Reflex to ID Panel     Status: None   Collection Time: 04/03/20  3:27 PM   Specimen: BLOOD  Result Value Ref Range Status   Specimen Description BLOOD BLOOD RIGHT ARM  Final   Special Requests   Final    BOTTLES DRAWN AEROBIC AND ANAEROBIC Blood Culture adequate volume   Culture   Final    NO GROWTH 5 DAYS Performed at [facility], [address] [place], [place] [address]    Report Status 04/08/2020 FINAL  Final  Urine Culture     Status: Abnormal   Collection Time: 04/03/20  4:00 PM   Specimen: Urine, Random  Result Value Ref Range Status   Specimen Description   Final    URINE, RANDOM Performed at [facility], [address] [place], [place] [address]    Special Requests   Final    NONE Performed at [facility], [address], [place], [place] [address]    Culture (A)  Final    >=100,000 COLONIES/mL ESCHERICHIA COLI >=100,000 COLONIES/mL AEROCOCCUS URINAE Standardized susceptibility testing for this organism is not available. Performed at [facility], [place] [address] [place], [place] [address]  Report Status 04/07/2020 FINAL  Final   Organism ID, Bacteria ESCHERICHIA COLI (A)  Final      Susceptibility   Escherichia coli - MIC*    AMPICILLIN 4 SENSITIVE Sensitive     CEFAZOLIN <=4 SENSITIVE Sensitive     CEFEPIME <=0.12 SENSITIVE Sensitive     CEFTRIAXONE <=0.25 SENSITIVE Sensitive     CIPROFLOXACIN <=0.25 SENSITIVE Sensitive     GENTAMICIN <=1 SENSITIVE Sensitive     IMIPENEM <=0.25 SENSITIVE Sensitive     NITROFURANTOIN 32 SENSITIVE Sensitive     TRIMETH/SULFA <=20 SENSITIVE Sensitive     AMPICILLIN/SULBACTAM <=2 SENSITIVE Sensitive     PIP/TAZO <=4 SENSITIVE Sensitive     * >=100,000 COLONIES/mL  ESCHERICHIA COLI  Culture, blood (Routine X 2) w Reflex to ID Panel     Status: None   Collection Time: 04/03/20 10:36 PM   Specimen: BLOOD  Result Value Ref Range Status   Specimen Description BLOOD BLOOD LEFT HAND  Final   Special Requests   Final    BOTTLES DRAWN AEROBIC AND ANAEROBIC Blood Culture adequate volume   Culture   Final    NO GROWTH 5 DAYS Performed at [facility], [address], [place], [place] [address]    Report Status 04/08/2020 FINAL  Final  Urine Culture     Status: None   Collection Time: 04/08/20  9:52 AM   Specimen: Urine, Random  Result Value Ref Range Status   Specimen Description   Final    URINE, RANDOM Performed at [facility], [address], [place], [place] [address]    Special Requests   Final    NONE Performed at [facility], [address], [place], [place] [address]    Culture   Final    NO GROWTH Performed at [facility], [place] [address], [place], [place] [address]    Report Status 04/09/2020 FINAL  Final  CULTURE, BLOOD (ROUTINE X 2) w Reflex to ID Panel     Status: None   Collection Time: 04/08/20 10:02 AM   Specimen: BLOOD  Result Value Ref Range Status   Specimen Description BLOOD BLOOD RIGHT HAND  Final   Special Requests   Final    BOTTLES DRAWN AEROBIC AND ANAEROBIC Blood Culture results may not be optimal due to an inadequate volume of blood received in culture bottles   Culture   Final    NO GROWTH 5 DAYS Performed at [facility], [address] [place],  [address]    Report Status 04/13/2020 FINAL  Final  MRSA PCR Screening     Status: None   Collection Time: 04/08/20 10:04 AM   Specimen: Nasal Mucosa; Nasopharyngeal  Result Value Ref Range Status   MRSA by PCR NEGATIVE NEGATIVE Final    Comment:        The GeneXpert MRSA Assay (FDA approved for NASAL specimens only), is one component of a comprehensive MRSA colonization surveillance program. It is not intended  to diagnose MRSA infection nor to guide or monitor treatment for MRSA infections. Performed at [facility], [address] [place],  [address]   CULTURE, BLOOD (ROUTINE X 2) w Reflex to ID Panel     Status: None   Collection Time: 04/08/20 10:26 AM   Specimen: BLOOD  Result Value Ref Range Status   Specimen Description BLOOD BLOOD RIGHT HAND  Final   Special Requests   Final    BOTTLES DRAWN AEROBIC AND  ANAEROBIC Blood Culture results may not be optimal due to an inadequate volume of blood received in culture bottles   Culture   Final    NO GROWTH 5 DAYS Performed at [facility], [address], [place], [place] [address]    Report Status 04/13/2020 FINAL  Final  Urine Culture     Status: None   Collection Time: 04/10/20  6:21 PM   Specimen: Urine, Random  Result Value Ref Range Status   Specimen Description   Final    URINE, RANDOM Performed at [facility], [address], [place], [place] [address]    Special Requests   Final    NONE Performed at [facility], [address] [place], [place] [address]    Culture   Final    NO GROWTH Performed at [facility], [place] [address] [place], [place] [address]    Report Status 04/11/2020 FINAL  Final  MRSA PCR Screening     Status: None   Collection Time: 04/12/20  3:25 PM   Specimen: Nasopharyngeal  Result Value Ref Range Status   MRSA by PCR NEGATIVE NEGATIVE Final    Comment:        The GeneXpert MRSA Assay (FDA approved for NASAL specimens only), is one component of a comprehensive MRSA colonization surveillance program. It is not intended to diagnose MRSA infection nor to guide or monitor treatment for MRSA infections. Performed at [facility], [address] [place], [place] [address]       Imaging Studies   CT CHEST ABDOMEN PELVIS W CONTRAST  Result Date: 04/12/2020 CLINICAL DATA:  Shortness of breath, abdominal pain, and bilateral leg  swelling. EXAM: CT CHEST, ABDOMEN, AND PELVIS WITH CONTRAST TECHNIQUE: Multidetector CT imaging of the chest, abdomen and pelvis was performed following the standard protocol during bolus administration of intravenous contrast. CONTRAST:  139mL OMNIPAQUE IOHEXOL 300 MG/ML  SOLN COMPARISON:  Right upper quadrant ultrasound from yesterday. CTA chest dated April 07, 2020. FINDINGS: CT CHEST FINDINGS Cardiovascular: Unchanged borderline cardiomegaly. No pericardial effusion. No thoracic aortic aneurysm or dissection. Coronary, aortic arch, and branch vessel atherosclerotic vascular disease. 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 No central pulmonary embolism. Mediastinum/Nodes: Unchanged mildly enlarged precarinal lymph node measuring 12 mm in short axis, stable since September 2020, likely reactive. No enlarged hilar or axillary lymph nodes. Prior thyroidectomy. Prominent circumferential wall thickening of the proximal and mid esophagus, slightly worsened since the prior study. Lungs/Pleura: Slightly increased small right pleural effusion. New trace left pleural effusion. Prominent atelectasis within the right lower lobe with patchy areas of non enhancement and air bronchograms consistent with underlying pneumonia, slightly worsened when compared to prior study. Increased patchy consolidation in both posterior upper lobes. Minimal subsegmental atelectasis in the left lower lobe. Moderate centrilobular and mild paraseptal emphysema again noted with bullous changes in the medial left lung apex. No pneumothorax. 7 mm subpleural nodule in the left upper lobe (series 4, image 39), similar to most recent chest CTs, but slightly more conspicuous when compared to CT chest from September 2020. Musculoskeletal: No acute or significant osseous findings. Unchanged chronic T12 compression deformity. Old left eighth rib fracture. CT  ABDOMEN PELVIS FINDINGS Hepatobiliary: Slightly lobular liver contour. No focal liver abnormality is seen. No gallstones, gallbladder wall thickening, or biliary dilatation. Pancreas: Unremarkable. No pancreatic ductal dilatation or surrounding inflammatory changes. Spleen: Normal in size without focal abnormality. Adrenals/Urinary  Tract: Chronic bilateral adrenal hypertrophy. No renal calculi, focal lesion, or hydronephrosis. The bladder is unremarkable. Stomach/Bowel: Stomach is within normal limits. Appendix appears normal. No evidence of bowel wall thickening, distention, or inflammatory changes. Prominent sigmoid colonic diverticulosis. Vascular/Lymphatic: Aortic atherosclerosis. 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 No enlarged abdominal or pelvic lymph nodes. Reproductive: Uterus and bilateral adnexa are unremarkable. Other: Scattered trace ascites. No pneumoperitoneum. Small fat containing paraumbilical hernias. Musculoskeletal: No acute or significant osseous findings. IMPRESSION: CT chest: 1. Mildly worsened multifocal pneumonia. 2. Slightly increased small right pleural effusion. New trace left pleural effusion. 3. 7 mm subpleural nodule in the left upper lobe, similar to most recent chest CTs, but slightly more conspicuous when compared to CT chest from September 2020. While this may be infectious or inflammatory, attention on follow-up imaging is recommended. 4. Prominent circumferential wall thickening of the proximal and mid esophagus, slightly worsened since the prior study, concerning for esophagitis. 5. 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 6. Aortic Atherosclerosis (ICD10-I70.0) and Emphysema (ICD10-J43.9). CT abdomen and pelvis: 1. No acute intra-abdominal process. 2. Slightly lobular liver contour with scattered trace ascites, suggestive of cirrhosis. 3. Severe stenosis of the proximal  left common iliac artery. Electronically Signed   By: Titus Dubin M.D.   On: 04/12/2020 13:51     Medications   Scheduled Meds: . amLODipine  10 mg Oral Daily  . vitamin C  1,000 mg Oral Daily  . chlorhexidine  15 mL Mouth Rinse BID  . Chlorhexidine Gluconate Cloth  6 each Topical Daily  . feeding supplement (GLUCERNA SHAKE)  237 mL Oral TID BM  . fluticasone  1 spray Each Nare BID  . furosemide  20 mg Oral Daily  . insulin aspart  0-5 Units Subcutaneous QHS  . insulin aspart  0-9 Units Subcutaneous TID WC  . insulin glargine  20 Units Subcutaneous Daily  . ipratropium-albuterol  3 mL Nebulization TID  . levothyroxine  150 mcg Oral Q0600  . losartan  100 mg Oral Daily  . mouth rinse  15 mL Mouth Rinse q12n4p  . methylPREDNISolone (SOLU-MEDROL) injection  20 mg Intravenous Q12H  . metoprolol tartrate  50 mg Oral BID  . [START ON 04/14/2020] multivitamin with minerals  1 tablet Oral Daily  . pantoprazole  40 mg Oral Daily  . potassium chloride  40 mEq Oral BID  . rosuvastatin  5 mg Oral Daily  . sodium chloride flush  3 mL Intravenous Q12H  . vitamin B-12  1,000 mcg Oral Daily   Continuous Infusions: . sodium chloride 10 mL/hr at 04/13/20 0546  . ampicillin-sulbactam (UNASYN) IV 1.5 g (04/13/20 1301)       LOS: 10 days    Time spent: 30 minutes    Ezekiel Slocumb, DO [facility]  04/13/2020, 2:44 PM    If 7PM-7AM, please contact night-coverage. How to contact the [facility] Attending or Consulting provider [place] or covering provider during after hours [place], for this patient?    1. Check the care team in [facility] and look for a) attending/consulting [facility] provider listed and b) the [facility] team listed 2. Log into [email or web address] and use [place]'s universal password to access. If you do not have the password, please contact the hospital operator. 3. Locate the [facility] provider you are looking for under [facility] and page to a number that you can be directly  reached. 4. If  you still have difficulty reaching the provider, please page the [facility] (Director on Call) for the Hospitalists listed on amion for assistance.

## 2020-04-13 NOTE — Progress Notes (Signed)
Pulmonary and Critical Care  Medicine          Date: 04/13/2020,   MRN# [id number] Martha [name] 08/13/1945     AdmissionWeight: 72 kg                 CurrentWeight: 68.4 kg  Referring physician: Dr. Si Raider    CHIEF COMPLAINT:   Acute on chronic hypoxemic respiratory failure   HISTORY OF PRESENT ILLNESS   74 year old female with history of diabetes COPD lifelong smoking hypothyroidism gout major depressive disorder, anxiety disorder and GERD, diverticulitis who came in with signs and symptoms of anasarca including peripheral edema and pulmonary edema.  Patient states is been worse than usual despite compliance with wrapping the lower extremities.  She also reported coughing and dyspnea.  Upon presentation to the emergency room she was found to be desaturating in the mid 80s and was placed on 4 L supplemental oxygen with some subsequent improvement of SPO2.  She was then treated with BiPAP and felt better.  She was initially admitted with acute exacerbation of CHF and cardiology was consulted for this.  She was also found to have proteinuria with CKD and had renal consultation placed. Pulmonary consultation placed for acute on chronic hypoxemic respiratory failure with failure to wean down oxygen.  Patient did have CT PE protocol done to rule out acute pulmonary venous embolism which was negative however did show bullous emphysema with overlying pulmonary edema as well as a right lower lobe consolidated infiltrate suggestive of pneumonia as well as right hemidiaphragm elevation due to hepatomegaly.  04/09/20- patient seen at bedside, remains encephalopathic.  Discussed with [facility] attending Dr Si Raider plan for transfer to SDU.     04/10/20- patient is improved.  She is more awake, she was able to speak with me despite BIPAP mask, she has normal 4/4 grip bilaterally and moves LE to verbal communication without encouragment. Mentation has significantly improved. She is flushed red on  examination.    04/11/20- patient is improved.  She is off BIPAP during my evaluation.  She is able to speak and reported right upper extermity pain, this may be related to K infusion. I dicussed this with RN who has already addressed this with IV team. ABG is in progress. Cardiology and nephrology following, UOP is low.   04/13/20- patient is improved.  She is speaking in full sentences. I spoke to her common law husband to review hospital course and care plan.  Patient will need BIPAP for home, ive discussed case with [facility] team and qualification for NIV is in process.  Spirometry is in process. 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  She smokes actively , I have provided smoking cessation counseling today and will be following up on outpatient for cessation therapy.  LE swelling is resolved and sensorium is close to baseline. There is no diarreah   PAST MEDICAL HISTORY   Past Medical History:  Diagnosis Date  . Abnormal LFTs (liver function tests)   . Anxiety   . Arthritis    knees, feet  . Asthma   . Chronic bronchitis ([place])   . Chronic constipation   . Chronic low back pain   . Cigarette smoker    Has cut back Smoking to 1 pack every other day  . COPD (chronic obstructive pulmonary disease) ([place])   . Depression   . Diabetic neuropathy ([place])   . Diverticulitis 2015   gi recommended repeat scope in 10 years  .  Essential hypertension   . Fatty liver   . GERD (gastroesophageal reflux disease)   . Gout   . History of mammogram 05/28/2013  . Hyperlipidemia   . Hypothyroidism   . Insomnia   . Iron deficiency anemia   . Low serum vitamin D   . Personal history of tobacco use, presenting hazards to health 09/29/2015  . Plantar fasciitis   . RLS (restless legs syndrome)   . Shortness of breath dyspnea   . Thyroid cancer ([place]) 2009  . Tremors of nervous system    "I think I have parkinson's disease"  . Type 2 diabetes mellitus ([place])   . Wears dentures    uppers      SURGICAL HISTORY   Past Surgical History:  Procedure Laterality Date  . CATARACT EXTRACTION W/PHACO Left 11/24/2015   Procedure: CATARACT EXTRACTION PHACO AND INTRAOCULAR LENS PLACEMENT (IOC);  Surgeon: Birder Robson, MD;  Location: [facility] ORS;  Service: Ophthalmology;  Laterality: Left;  [place] 38.3 AP% 20.6 CDE 7.89 FLUID PACK LOT # [id number]  . CATARACT EXTRACTION W/PHACO Right 06/23/2019   Procedure: CATARACT EXTRACTION PHACO AND INTRAOCULAR LENS PLACEMENT ([place]) RIGHT DIABETIC;  Surgeon: Birder Robson, MD;  Location: [place];  Service: Ophthalmology;  Laterality: Right;  Diabetic  . COLONOSCOPY  2004, 2009, 2015   [place]  . POLYPECTOMY  2009  . THYROIDECTOMY  2009   states she was tx with radiactive iodine  . TUBAL LIGATION    . UPPER GASTROINTESTINAL ENDOSCOPY       FAMILY HISTORY   Family History  Problem Relation Age of Onset  . Diabetes Mellitus II Father   . Lung disease Father   . Diabetes Mellitus II Mother   . Arthritis Mother   . Thyroid disease Other   . Asthma Sister   . Emphysema Sister   . Hypertension Sister   . Breast cancer Paternal Aunt      SOCIAL HISTORY   Social History   Tobacco Use  . Smoking status: Current Every Day Smoker    Packs/day: 1.00    Years: 39.50    Pack years: 39.50    Types: Cigarettes  . Smokeless tobacco: Never Used  . Tobacco comment: curently smokes 12 cigerattes a day. I'm trying to cut back.  Vaping Use  . Vaping Use: Never used  Substance Use Topics  . Alcohol use: No    Alcohol/week: 0.0 standard drinks  . Drug use: No     MEDICATIONS    Home Medication:    Current Medication:  Current Facility-Administered Medications:  .  0.9 %  sodium chloride infusion, 250 mL, Intravenous, PRN, Ivor Costa, MD, Stopped at 04/13/20 1533 .  acetaminophen (TYLENOL) suppository 650 mg, 650 mg, Rectal, Q6H PRN, Lang Snow, NP, 650 mg at 04/08/20 2234 .  acetaminophen (TYLENOL) tablet 650  mg, 650 mg, Oral, Q6H PRN, Lorella Nimrod, MD, 650 mg at 04/13/20 1126 .  albuterol (PROVENTIL) (2.5 MG/3ML) 0.083% nebulizer solution 2.5 mg, 2.5 mg, Nebulization, Q4H PRN, Ivor Costa, MD .  amLODipine (NORVASC) tablet 10 mg, 10 mg, Oral, Daily, Wouk, Ailene Rud, MD, 10 mg at 04/13/20 1128 .  ampicillin-sulbactam (UNASYN) 1.5 g in sodium chloride 0.9 % 100 mL IVPB, 1.5 g, Intravenous, Q6H, Ravishankar, Jayashree, MD, Last Rate: 200 mL/hr at 04/13/20 1301, 1.5 g at 04/13/20 1301 .  ascorbic acid (VITAMIN C) tablet 1,000 mg, 1,000 mg, Oral, Daily, Ivor Costa, MD, 1,000 mg at 04/13/20 1129 .  calcium carbonate (  TUMS - dosed in mg elemental calcium) chewable tablet 200 mg of elemental calcium, 1 tablet, Oral, Q8H PRN, Nicole Kindred A, DO .  chlorhexidine (PERIDEX) 0.12 % solution 15 mL, 15 mL, Mouth Rinse, BID, Wouk, Ailene Rud, MD, 15 mL at 04/13/20 0948 .  Chlorhexidine Gluconate Cloth 2 % PADS 6 each, 6 each, Topical, Daily, Wouk, Ailene Rud, MD, 6 each at 04/13/20 0950 .  dextromethorphan-guaiFENesin (MUCINEX DM) 30-600 MG per 12 hr tablet 1 tablet, 1 tablet, Oral, BID PRN, Ivor Costa, MD, 1 tablet at 04/06/20 0945 .  feeding supplement (GLUCERNA SHAKE) (GLUCERNA SHAKE) liquid 237 mL, 237 mL, Oral, TID BM, Griffith, Kelly A, DO, 237 mL at 04/13/20 1453 .  fluticasone (FLONASE) 50 MCG/ACT nasal spray 1 spray, 1 spray, Each Nare, BID, Sharion Settler, NP, 1 spray at 04/12/20 0940 .  fluticasone (FLONASE) 50 MCG/ACT nasal spray 1-2 spray, 1-2 spray, Each Nare, Daily PRN, Ivor Costa, MD .  insulin aspart (novoLOG) injection 0-5 Units, 0-5 Units, Subcutaneous, QHS, Ivor Costa, MD, 2 Units at 04/12/20 2125 .  insulin aspart (novoLOG) injection 0-9 Units, 0-9 Units, Subcutaneous, TID WC, Ivor Costa, MD, 1 Units at 04/13/20 1644 .  insulin glargine (LANTUS) injection 20 Units, 20 Units, Subcutaneous, Daily, Wouk, Ailene Rud, MD, 20 Units at 04/13/20 1301 .  ipratropium-albuterol (DUONEB) 0.5-2.5  (3) MG/3ML nebulizer solution 3 mL, 3 mL, Nebulization, TID, Ivor Costa, MD, 3 mL at 04/13/20 1354 .  labetalol (NORMODYNE) injection 20 mg, 20 mg, Intravenous, Q2H PRN, Wouk, Ailene Rud, MD, 20 mg at 04/12/20 1529 .  levothyroxine (SYNTHROID) tablet 150 mcg, 150 mcg, Oral, Q0600, Ivor Costa, MD, 150 mcg at 04/08/20 0616 .  losartan (COZAAR) tablet 100 mg, 100 mg, Oral, Daily, Wouk, Ailene Rud, MD, 100 mg at 04/13/20 1131 .  MEDLINE mouth rinse, 15 mL, Mouth Rinse, q12n4p, Wouk, Ailene Rud, MD, 15 mL at 04/13/20 1131 .  metoprolol tartrate (LOPRESSOR) tablet 50 mg, 50 mg, Oral, BID, Paraschos, Alexander, MD, 50 mg at 04/13/20 1132 .  [START ON 04/14/2020] multivitamin with minerals tablet 1 tablet, 1 tablet, Oral, Daily, Nicole Kindred A, DO .  nitroGLYCERIN (NITROSTAT) SL tablet 0.4 mg, 0.4 mg, Sublingual, Q5 min PRN, Wouk, Ailene Rud, MD, 0.4 mg at 04/08/20 0659 .  ondansetron (ZOFRAN) injection 4 mg, 4 mg, Intravenous, Q8H PRN, Ivor Costa, MD, 4 mg at 04/12/20 0856 .  pantoprazole (PROTONIX) EC tablet 40 mg, 40 mg, Oral, Daily, Nicole Kindred A, DO, 40 mg at 04/13/20 1305 .  potassium chloride SA (KLOR-CON) CR tablet 40 mEq, 40 mEq, Oral, BID, Wouk, Ailene Rud, MD, 40 mEq at 04/13/20 1137 .  [START ON 04/14/2020] predniSONE (DELTASONE) tablet 40 mg, 40 mg, Oral, Q breakfast, Anicia Leuthold, MD .  primidone (MYSOLINE) tablet 50 mg, 50 mg, Oral, BID, Nicole Kindred A, DO .  rosuvastatin (CRESTOR) tablet 5 mg, 5 mg, Oral, Daily, Ivor Costa, MD, 5 mg at 04/13/20 1138 .  [START ON 04/14/2020] sertraline (ZOLOFT) tablet 50 mg, 50 mg, Oral, Daily, Nicole Kindred A, DO .  sodium chloride flush (NS) 0.9 % injection 3 mL, 3 mL, Intravenous, Q12H, Ivor Costa, MD, 3 mL at 04/13/20 0949 .  sodium chloride flush (NS) 0.9 % injection 3 mL, 3 mL, Intravenous, PRN, Ivor Costa, MD .  vitamin B-12 (CYANOCOBALAMIN) tablet 1,000 mcg, 1,000 mcg, Oral, Daily, Ivor Costa, MD, 1,000 mcg at 04/13/20  1139    ALLERGIES   Nicotine polacrilex, Nicotrol [nicotine], and Codeine sulfate  REVIEW OF SYSTEMS    Review of Systems:  Unable to obtain due to encephalopathy   VS: BP (!) 149/53 (BP Location: Right Arm)   Pulse 64   Temp 99 F (37.2 C) (Oral)   Resp 20   Ht '5\' 4"'  (1.626 m)   Wt 68.4 kg   SpO2 91%   BMI 25.90 kg/m      PHYSICAL EXAM    GENERAL:NAD, no fevers, chills, no weakness no fatigue HEAD: Normocephalic, atraumatic.  EYES: Pupils equal, round, reactive to light. Extraocular muscles intact. No scleral icterus.  MOUTH: dry mucosal membrane. Dentition intact. No abscess noted. On BIPAP EAR, NOSE, THROAT: Clear without exudates. No external lesions.  NECK: Supple. No thyromegaly. No nodules. No JVD.  PULMONARY: mild rhonchi bilaterally  CARDIOVASCULAR: S1 and S2. Regular rate and rhythm. No murmurs, rubs, or gallops. No edema. Pedal pulses 2+ bilaterally.  GASTROINTESTINAL: Soft, nontender, nondistended. No masses. Positive bowel sounds. No hepatosplenomegaly.  MUSCULOSKELETAL: No swelling, clubbing, +edema 2++.  NEUROLOGIC: GCS 10  SKIN: No ulceration, lesions, rashes, or cyanosis. Skin warm and dry. Turgor intact.       IMAGING    DG Chest 1 View  Result Date: 04/09/2020 CLINICAL DATA:  Acute respiratory failure EXAM: CHEST  1 VIEW COMPARISON:  04/03/2016 FINDINGS: Normal cardiac silhouette. Interval increase in interstitial edema pattern. No focal consolidation. No pneumothorax. IMPRESSION: Worsening interstitial edema. Electronically Signed   By: Suzy Bouchard M.D.   On: 04/09/2020 06:31   DG Chest 2 View  Result Date: 04/03/2020 CLINICAL DATA:  Hypoxia. Leg edema and shortness of breath. Pt daughter states that pt is supposed to be getting a renal bx but they are not able to do it because of the edema in pts legs. Pt states that she does feel short of breath. Pt does not wear oxygen at home. Pt placed on O2 in triage due to sats being in  the 80s. Hx of asthma, chronic bronchitis, COPD, thyroid cancer- 2009, DM, current smoker. EXAM: CHEST - 2 VIEW COMPARISON:  01/04/2018 FINDINGS: Cardiac silhouette is borderline enlarged. Stable aortic atherosclerotic calcifications. No mediastinal or hilar masses. No evidence of adenopathy. Lungs demonstrate diffuse bilateral irregular interstitial thickening which has increased compared to the prior study. There is also linear/reticular scarring in the left lung base, which is stable. No lung consolidation. No pleural effusion and no pneumothorax. Skeletal structures are demineralized, but grossly intact. IMPRESSION: 1. Bilateral irregular interstitial thickening, increased compared to the prior study, along with borderline cardiomegaly. Suspect mild congestive heart failure. Consider diffuse interstitial infection or inflammation in the proper clinical setting. No evidence of lobar pneumonia. Electronically Signed   By: Lajean Manes M.D.   On: 04/03/2020 13:06   CT CHEST W CONTRAST  Result Date: 04/06/2020 CLINICAL DATA:  Hemoptysis. EXAM: CT CHEST WITH CONTRAST TECHNIQUE: Multidetector CT imaging of the chest was performed during intravenous contrast administration. CONTRAST:  26m OMNIPAQUE IOHEXOL 300 MG/ML  SOLN COMPARISON:  January 06, 2019 FINDINGS: Cardiovascular: There is marked severity calcification of the aortic arch. There is mild cardiomegaly. No pericardial effusion. Mediastinum/Nodes: There is mild pretracheal and right hilar lymphadenopathy. Thyroid gland, trachea, and esophagus demonstrate no significant findings. Lungs/Pleura: Mild-to-moderate severity emphysematous lung disease is seen involving the bilateral upper lobes with stable bullous disease is seen along the medial aspect of the left apex. Mild to moderate severity areas of atelectasis and/or infiltrate are seen within the posterior aspects of the bilateral upper lobes and left lung base.  Moderate to marked severity posterior  right basilar atelectasis and/or infiltrate is noted. A very small right pleural effusion is seen. No pneumothorax is identified. Upper Abdomen: There is stable hepatosplenomegaly with stable, moderate severity diffuse bilateral adrenal gland enlargement. Musculoskeletal: A chronic lateral eighth left rib fracture is seen. A chronic compression fracture deformity of the T12 vertebral body is noted. IMPRESSION: 1. Mild-to-moderate severity posterior bilateral upper lobe and left basilar atelectasis and/or infiltrate. 2. Moderate to marked severity posterior right basilar atelectasis and/or infiltrate. 3. Very small right pleural effusion. 4. Mild cardiomegaly. 5. Stable emphysematous lung disease. 6. Chronic lateral eighth left rib fracture. 7. Chronic compression fracture deformity of the T12 vertebral body. 8. Aortic atherosclerosis. Aortic Atherosclerosis (ICD10-I70.0) and Emphysema (ICD10-J43.9). Electronically Signed   By: Virgina Norfolk M.D.   On: 04/06/2020 15:33   CT ANGIO CHEST PE W OR WO CONTRAST  Result Date: 04/07/2020 CLINICAL DATA:  Shortness of breath EXAM: CT ANGIOGRAPHY CHEST WITH CONTRAST TECHNIQUE: Multidetector CT imaging of the chest was performed using the standard protocol during bolus administration of intravenous contrast. Multiplanar CT image reconstructions and MIPs were obtained to evaluate the vascular anatomy. CONTRAST:  36m OMNIPAQUE IOHEXOL 350 MG/ML SOLN COMPARISON:  April 06, 2020 FINDINGS: Cardiovascular: There is a optimal opacification of the pulmonary arteries. There is no central,segmental, or subsegmental filling defects within the pulmonary arteries. There is unchanged cardiomegaly. Mitral valve calcifications and coronary artery calcifications are seen. No pericardial effusion or thickening. No evidence right heart strain. There is normal three-vessel brachiocephalic anatomy without proximal stenosis. Scattered aortic atherosclerosis is noted. Mediastinum/Nodes: No  hilar, mediastinal, or axillary adenopathy. Thyroid gland, trachea, and esophagus demonstrate no significant findings. Lungs/Pleura: Patchy airspace opacity seen at the left upper lung and posterior right lung base with air bronchograms. There are trace bilateral pleural effusions present. Centrilobular and paraseptal emphysematous changes seen at both lung apices. Upper Abdomen: No acute abnormalities present in the visualized portions of the upper abdomen. Musculoskeletal: No chest wall abnormality. No acute or significant osseous findings. There is a chronic anterior wedge compression deformity of the T12 vertebral body with 50% loss in vertebral body height. Review of the MIP images confirms the above findings. IMPRESSION: No central, segmental, or subsegmental pulmonary embolism. Unchanged multifocal patchy airspace opacities within both lungs which could be due to multifocal pneumonia Trace bilateral pleural effusions, right greater than left. Aortic Atherosclerosis (ICD10-I70.0). Electronically Signed   By: BPrudencio PairM.D.   On: 04/07/2020 19:43   CT CHEST ABDOMEN PELVIS W CONTRAST  Result Date: 04/12/2020 CLINICAL DATA:  Shortness of breath, abdominal pain, and bilateral leg swelling. EXAM: CT CHEST, ABDOMEN, AND PELVIS WITH CONTRAST TECHNIQUE: Multidetector CT imaging of the chest, abdomen and pelvis was performed following the standard protocol during bolus administration of intravenous contrast. CONTRAST:  1090mOMNIPAQUE IOHEXOL 300 MG/ML  SOLN COMPARISON:  Right upper quadrant ultrasound from yesterday. CTA chest dated April 07, 2020. FINDINGS: CT CHEST FINDINGS Cardiovascular: Unchanged borderline cardiomegaly. No pericardial effusion. No thoracic aortic aneurysm or dissection. Coronary, aortic arch, and branch vessel atherosclerotic vascular disease. 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 No  central pulmonary embolism. Mediastinum/Nodes: Unchanged mildly enlarged precarinal lymph node measuring 12 mm in short axis, stable since September 2020, likely reactive. No enlarged hilar or axillary lymph nodes. Prior thyroidectomy. Prominent circumferential wall thickening of the proximal and mid esophagus, slightly worsened since the prior study. Lungs/Pleura: Slightly increased small right  pleural effusion. New trace left pleural effusion. Prominent atelectasis within the right lower lobe with patchy areas of non enhancement and air bronchograms consistent with underlying pneumonia, slightly worsened when compared to prior study. Increased patchy consolidation in both posterior upper lobes. Minimal subsegmental atelectasis in the left lower lobe. Moderate centrilobular and mild paraseptal emphysema again noted with bullous changes in the medial left lung apex. No pneumothorax. 7 mm subpleural nodule in the left upper lobe (series 4, image 39), similar to most recent chest CTs, but slightly more conspicuous when compared to CT chest from September 2020. Musculoskeletal: No acute or significant osseous findings. Unchanged chronic T12 compression deformity. Old left eighth rib fracture. CT ABDOMEN PELVIS FINDINGS Hepatobiliary: Slightly lobular liver contour. No focal liver abnormality is seen. No gallstones, gallbladder wall thickening, or biliary dilatation. Pancreas: Unremarkable. No pancreatic ductal dilatation or surrounding inflammatory changes. Spleen: Normal in size without focal abnormality. Adrenals/Urinary Tract: Chronic bilateral adrenal hypertrophy. No renal calculi, focal lesion, or hydronephrosis. The bladder is unremarkable. Stomach/Bowel: Stomach is within normal limits. Appendix appears normal. No evidence of bowel wall thickening, distention, or inflammatory changes. Prominent sigmoid colonic diverticulosis. Vascular/Lymphatic: Aortic atherosclerosis. Severe stenosis of the proximal left common  iliac artery. No enlarged abdominal or pelvic lymph nodes. Reproductive: Uterus and bilateral adnexa are unremarkable. Other: Scattered trace ascites. No pneumoperitoneum. Small fat containing paraumbilical hernias. Musculoskeletal: No acute or significant osseous findings. IMPRESSION: CT chest: 1. Mildly worsened multifocal pneumonia. 2. Slightly increased small right pleural effusion. New trace left pleural effusion. 3. 7 mm subpleural nodule in the left upper lobe, similar to most recent chest CTs, but slightly more conspicuous when compared to CT chest from September 2020. While this may be infectious or inflammatory, attention on follow-up imaging is recommended. 4. Prominent circumferential wall thickening of the proximal and mid esophagus, slightly worsened since the prior study, concerning for esophagitis. 5. 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 6. Aortic Atherosclerosis (ICD10-I70.0) and Emphysema (ICD10-J43.9). CT abdomen and pelvis: 1. No acute intra-abdominal process. 2. Slightly lobular liver contour with scattered trace ascites, suggestive of cirrhosis. 3. 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 Electronically Signed   By: Titus Dubin M.D.   On: 04/12/2020 13:51   US Venous Img Lower Bilateral (DVT)  Result Date: 04/03/2020 CLINICAL DATA:  Lower leg cellulitis EXAM: BILATERAL LOWER EXTREMITY VENOUS DOPPLER ULTRASOUND TECHNIQUE: Gray-scale sonography with graded compression, as well as color Doppler and duplex ultrasound were performed to evaluate the lower extremity deep venous systems from the level of the common femoral vein and including the common femoral, femoral, profunda femoral, popliteal and calf veins including the posterior tibial, peroneal and gastrocnemius veins when visible. The superficial great saphenous vein was also interrogated. Spectral Doppler was utilized to  evaluate flow at rest and with distal augmentation maneuvers in the common femoral, femoral and popliteal veins. COMPARISON:  None. FINDINGS: RIGHT LOWER EXTREMITY Common Femoral Vein: No evidence of thrombus. Normal compressibility, respiratory phasicity and response to augmentation. Saphenofemoral Junction: No evidence of thrombus. Normal compressibility and flow on color Doppler imaging. Profunda Femoral Vein: No evidence of thrombus. Normal compressibility and flow on color Doppler imaging. Femoral Vein: No evidence of thrombus. Normal compressibility, respiratory phasicity and response to augmentation. Popliteal Vein: Not well visualized due to overlying edema. Calf Veins: Not well visualized due to overlying edema. Superficial Great Saphenous Vein: No evidence of thrombus. Normal compressibility. Venous Reflux:  None. Other  Findings:  None. LEFT LOWER EXTREMITY Common Femoral Vein: No evidence of thrombus. Normal compressibility, respiratory phasicity and response to augmentation. Saphenofemoral Junction: No evidence of thrombus. Normal compressibility and flow on color Doppler imaging. Profunda Femoral Vein: No evidence of thrombus. Normal compressibility and flow on color Doppler imaging. Femoral Vein: No evidence of thrombus. Normal compressibility, respiratory phasicity and response to augmentation. Popliteal Vein: Not well visualized due to overlying edema. Calf Veins: Not well visualized due to overlying edema. Superficial Great Saphenous Vein: No evidence of thrombus. Normal compressibility. Venous Reflux:  None. Other Findings:  None. IMPRESSION: Somewhat limited exam due to peripheral edema although no central deep venous thrombosis is noted. Electronically Signed   By: Inez Catalina M.D.   On: 04/03/2020 15:58   ECHOCARDIOGRAM COMPLETE  Result Date: 04/04/2020    ECHOCARDIOGRAM REPORT   Patient Name:   HADLEA FURUYA Date of Exam: 04/04/2020 Medical Rec #:  [id number]      Height:       64.0 in  Accession #:    [id number]     Weight:       181.8 lb Date of Birth:  06-Jul-1945     BSA:          1.879 m Patient Age:    24 years       BP:           171/78 mmHg Patient Gender: F              HR:           98 bpm. Exam Location:  [facility] Procedure: 2D Echo, Color Doppler, Cardiac Doppler and Intracardiac            Opacification Agent Indications:     I50.31 CHF-Acute Diastolic  History:         Patient has no prior history of Echocardiogram examinations.                  COPD, Signs/Symptoms:Shortness of Breath; Risk Factors:Diabetes                  and Current Smoker.  Sonographer:     Charmayne Sheer RDCS (AE) Referring Phys:  Baker Janus Soledad Gerlach NIU Diagnosing Phys: Bartholome Bill MD  Sonographer Comments: Technically difficult study due to poor echo windows. Image acquisition challenging due to patient body habitus and Image acquisition challenging due to COPD. IMPRESSIONS  1. Left ventricular ejection fraction, by estimation, is 55 to 60%. The left ventricle has normal function. The left ventricle has no regional wall motion abnormalities. Left ventricular diastolic parameters are consistent with Grade I diastolic dysfunction (impaired relaxation).  2. Right ventricular systolic function is normal. The right ventricular size is mildly enlarged.  3. Left atrial size was mildly dilated.  4. Right atrial size was mildly dilated.  5. The mitral valve was not well visualized. Trivial mitral valve regurgitation.  6. The aortic valve was not well visualized. Aortic valve regurgitation is trivial. FINDINGS  Left Ventricle: Left ventricular ejection fraction, by estimation, is 55 to 60%. The left ventricle has normal function. The left ventricle has no regional wall motion abnormalities. Definity contrast agent was given IV to delineate the left ventricular  endocardial borders. The left ventricular internal cavity size was normal in size. There is borderline left ventricular hypertrophy. Left ventricular diastolic parameters are  consistent with Grade I diastolic dysfunction (impaired relaxation). Right Ventricle: The right ventricular size is mildly enlarged. No increase in right ventricular wall  thickness. Right ventricular systolic function is normal. Left Atrium: Left atrial size was mildly dilated. Right Atrium: Right atrial size was mildly dilated. Pericardium: There is no evidence of pericardial effusion. Mitral Valve: The mitral valve was not well visualized. Trivial mitral valve regurgitation. MV peak gradient, 7.4 mmHg. The mean mitral valve gradient is 3.0 mmHg. Tricuspid Valve: The tricuspid valve is not well visualized. Tricuspid valve regurgitation is trivial. Aortic Valve: The aortic valve was not well visualized. Aortic valve regurgitation is trivial. Aortic valve mean gradient measures 3.0 mmHg. Aortic valve peak gradient measures 6.8 mmHg. Aortic valve area, by VTI measures 2.46 cm. Pulmonic Valve: The pulmonic valve was not well visualized. Pulmonic valve regurgitation is not visualized. Aorta: The aortic root was not well visualized. IAS/Shunts: The interatrial septum was not assessed.  LEFT VENTRICLE PLAX 2D LVIDd:         4.15 cm     Diastology LVIDs:         3.64 cm     LV e' medial:    5.98 cm/s LV PW:         1.43 cm     LV E/e' medial:  18.3 LV IVS:        1.12 cm     LV e' lateral:   6.53 cm/s LVOT diam:     2.00 cm     LV E/e' lateral: 16.8 LV SV:         55 LV SV Index:   29 LVOT Area:     3.14 cm  LV Volumes (MOD) LV vol d, MOD A2C: 55.5 ml LV vol d, MOD A4C: 63.8 ml LV vol s, MOD A2C: 34.3 ml LV vol s, MOD A4C: 23.4 ml LV SV MOD A2C:     21.2 ml LV SV MOD A4C:     63.8 ml LV SV MOD BP:      34.2 ml RIGHT VENTRICLE RV Basal diam:  3.76 cm LEFT ATRIUM             Index       RIGHT ATRIUM           Index LA diam:        4.90 cm 2.61 cm/m  RA Area:     19.80 cm LA Vol (A2C):   59.5 ml 31.67 ml/m RA Volume:   61.10 ml  32.52 ml/m LA Vol (A4C):   57.5 ml 30.61 ml/m LA Biplane Vol: 61.2 ml 32.58 ml/m  AORTIC  VALVE                   PULMONIC VALVE AV Area (Vmax):    2.51 cm    PV Vmax:       0.76 m/s AV Area (Vmean):   2.43 cm    PV Vmean:      53.200 cm/s AV Area (VTI):     2.46 cm    PV VTI:        0.126 m AV Vmax:           130.00 cm/s PV Peak grad:  2.3 mmHg AV Vmean:          83.500 cm/s PV Mean grad:  1.0 mmHg AV VTI:            0.225 m AV Peak Grad:      6.8 mmHg AV Mean Grad:      3.0 mmHg LVOT Vmax:         104.00 cm/s LVOT  Vmean:        64.700 cm/s LVOT VTI:          0.176 m LVOT/AV VTI ratio: 0.78  AORTA Ao Root diam: 3.10 cm MITRAL VALVE                TRICUSPID VALVE MV Area (PHT): 3.00 cm     TR Peak grad:   28.9 mmHg MV Peak grad:  7.4 mmHg     TR Vmax:        269.00 cm/s MV Mean grad:  3.0 mmHg MV Vmax:       1.36 m/s     SHUNTS MV Vmean:      86.9 cm/s    Systemic VTI:  0.18 m MV Decel Time: 253 msec     Systemic Diam: 2.00 cm MV E velocity: 109.50 cm/s MV A velocity: 137.50 cm/s MV E/A ratio:  0.80 Bartholome Bill MD Electronically signed by Bartholome Bill MD Signature Date/Time: 04/04/2020/4:54:02 PM    Final    US Abdomen Limited RUQ (LIVER/GB)  Result Date: 04/11/2020 CLINICAL DATA:  Hyperbilirubinemia in a 74 year old female EXAM: ULTRASOUND ABDOMEN LIMITED RIGHT UPPER QUADRANT COMPARISON:  April 07, 2020 CT angiography of the chest and CT of the chest of April 06, 2020 FINDINGS: Gallbladder: Wall thickness at upper limits of normal. No visible pericholecystic fluid or evidence of cholelithiasis. No reported tenderness over the gallbladder. Common bile duct: Diameter: 2.2 mm Liver: Mildly heterogeneous echotexture with nodular contour and suggestion of fissural widening particularly on image 22. No visible lesion on submitted images. Portal vein is patent on color Doppler imaging with normal direction of blood flow towards the liver. Other: Trace ascites IMPRESSION: 1. Heterogeneous, mildly heterogeneous hepatic echotexture with question of lobular contour and fissural widening, findings  could be seen in the setting of early liver disease. 2. Gallbladder wall thickness at upper limits of normal could be seen in the setting of liver disease and is nonspecific, not associated with biliary calculi, reported tenderness or biliary duct dilation. Electronically Signed   By: Zetta Bills M.D.   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  -12/15- DCD lasix , DCD solumedrol , started PO prednisone taper at 44m po daily   Altered mental status with confusion-imporoved  -severe hypercapnic 3encephalopathy -Meds have been adjusted and mentation is improved -will obtain ABG to evaluate hypercanic encephalopathy -continue on BIPAP -[facility] - BIPAP qualifiacation - spirometry in process s/p ABG       Thank you for allowing me to participate in the care of this patient.    Patient/Family are satisfied with care plan and all questions have been answered.   This document was prepared using Dragon voice recognition software and may include unintentional dictation errors.     FOttie Glazier M.D.  Division of [facility]

## 2020-04-13 NOTE — Progress Notes (Signed)
[facility]  ROUNDING NOTE   Subjective:   Patient resting in bed, in no acute distress. Respirations slightly labored, on 10L supplemental O2 via high flow nasal cannula. F/C draining clear yellow urine output. Total output for the preceding 24 hours is 2,065 ml.     Objective:  Vital signs in last 24 hours:  Temp:  [96.3 F (35.7 C)-98.9 F (37.2 C)] 98.6 F (37 C) (12/15 1240) Pulse Rate:  [65-86] 65 (12/15 1240) Resp:  [18-21] 20 (12/15 1240) BP: (147-173)/(56-76) 158/56 (12/15 1240) SpO2:  [60 %-98 %] 89 % (12/15 1240) Weight:  [68.4 kg] 68.4 kg (12/15 0448)  Weight change:  Filed Weights   04/09/20 0406 04/09/20 1034 04/13/20 0448  Weight: 69.2 kg 66.9 kg 68.4 kg    Intake/Output: I/O last 3 completed shifts: In: 2931.5 [P.O.:60; I.V.:2063.5; IV Piggyback:807.9] Out: 2065 [Urine:2065]   Intake/Output this shift:  No intake/output data recorded.  Physical Exam: General:  Resting in bed, appears chronically ill  Head: Normocephalic,atraumatic,oral mucous membranes dry  Eyes:  Sclerae and conjunctivae clear  Lungs:   Respirations symmetrical, accessory muscle use +, Fine crackles at bases, on 10L O2 via HFNC  Heart:  S1S2, no rubs or gallops  Abdomen:   Soft, nontender, nondistended  Extremities:  No peripheral edema  Neurologic:  Awake, alert, answering simple questions appropriately  Skin:  Areas of ecchymoses on bilateral arms,resolving    Basic Metabolic Panel: Recent Labs  Lab 04/09/20 0438 04/10/20 0517 04/10/20 1444 04/11/20 0428 04/12/20 0623 04/13/20 0441  NA 143 149* 147* 147* 142 141  K 2.9* 2.4* 3.1* 3.3* 3.1* 4.1  CL 87* 93* 94* 94* 84* 95*  CO2 39* 40* 38* 39* 44* 35*  GLUCOSE 195* 195* 228* 220* 328* 206*  BUN 26* 39* 42* 40* 37* 30*  CREATININE 0.58 0.56 0.61 0.62 0.55 0.55  CALCIUM 9.7 9.7 9.2 9.2 9.3 8.9  MG 2.3 2.4  --  2.2 2.2 2.0    Liver Function Tests: Recent Labs  Lab 04/09/20 0438 04/10/20 0517  04/10/20 1040 04/11/20 0428 04/12/20 0623 04/13/20 0441  AST 16 15  --  19 25 18   ALT 18 15  --  16 29 27   ALKPHOS 81 82  --  74 84 77  BILITOT 1.9* 1.8* 1.5* 2.1* 2.2* 1.6*  PROT 7.8 7.5  --  7.0 6.6 6.3*  ALBUMIN 5.3* 4.9  --  4.4 4.1 3.8   Recent Labs  Lab 04/09/20 0438  LIPASE 27   Recent Labs  Lab 04/08/20 1002  AMMONIA 34    CBC: Recent Labs  Lab 04/07/20 0416 04/10/20 0517 04/11/20 0428 04/12/20 0623 04/13/20 0441  WBC 8.6 11.0* 13.9* 16.2* 16.6*  HGB 14.1 15.9* 15.3* 15.7* 14.7  HCT 45.5 51.4* 51.1* 51.5* 48.3*  MCV 91.9 93.3 94.1 91.8 92.0  PLT 177 173 191 179 186    Cardiac Enzymes: No results for input(s): CKTOTAL, CKMB, CKMBINDEX, TROPONINI in the last 168 hours.  BNP: Invalid input(s): POCBNP  CBG: Recent Labs  Lab 04/12/20 1626 04/12/20 1712 04/12/20 2044 04/13/20 0828 04/13/20 1241  GLUCAP 175* 200* 207* 350* 149*    Microbiology: Results for orders placed or performed during the hospital encounter of 04/03/20  Resp Panel by RT-PCR (Flu A&B, Covid) Nasopharyngeal Swab     Status: None   Collection Time: 04/03/20 11:41 AM   Specimen: Nasopharyngeal Swab; Nasopharyngeal(NP) swabs in vial transport medium  Result Value Ref Range Status   SARS Coronavirus  2 by RT PCR NEGATIVE NEGATIVE Final    Comment: (NOTE) SARS-CoV-2 target nucleic acids are NOT DETECTED.  The SARS-CoV-2 RNA is generally detectable in upper respiratory specimens during the acute phase of infection. The lowest concentration of SARS-CoV-2 viral copies this assay can detect is 138 copies/mL. A negative result does not preclude SARS-Cov-2 infection and should not be used as the sole basis for treatment or other patient management decisions. A negative result may occur with  improper specimen collection/handling, submission of specimen other than nasopharyngeal swab, presence of viral mutation(s) within the areas targeted by this assay, and inadequate number of  viral copies(<138 copies/mL). A negative result must be combined with clinical observations, patient history, and epidemiological information. The expected result is Negative.  Fact Sheet for Patients:  EntrepreneurPulse.com.au  Fact Sheet for Healthcare Providers:  IncredibleEmployment.be  This test is no t yet approved or cleared by the [place] FDA and  has been authorized for detection and/or diagnosis of SARS-CoV-2 by FDA under an Emergency Use Authorization (EUA). This EUA will remain  in effect (meaning this test can be used) for the duration of the COVID-19 declaration under Section 564(b)(1) of the Act, 21 U.S.C.section 360bbb-3(b)(1), unless the authorization is terminated  or revoked sooner.       Influenza A by PCR NEGATIVE NEGATIVE Final   Influenza B by PCR NEGATIVE NEGATIVE Final    Comment: (NOTE) The Xpert Xpress SARS-CoV-2/FLU/RSV plus assay is intended as an aid in the diagnosis of influenza from Nasopharyngeal swab specimens and should not be used as a sole basis for treatment. Nasal washings and aspirates are unacceptable for Xpert Xpress SARS-CoV-2/FLU/RSV testing.  Fact Sheet for Patients: EntrepreneurPulse.com.au  Fact Sheet for Healthcare Providers: IncredibleEmployment.be  This test is not yet approved or cleared by the [place] FDA and has been authorized for detection and/or diagnosis of SARS-CoV-2 by FDA under an Emergency Use Authorization (EUA). This EUA will remain in effect (meaning this test can be used) for the duration of the COVID-19 declaration under Section 564(b)(1) of the Act, 21 U.S.C. section 360bbb-3(b)(1), unless the authorization is terminated or revoked.  Performed at [facility], [address] [place], [place] [address]   Culture, blood (Routine X 2) w Reflex to ID Panel     Status: None   Collection Time: 04/03/20  3:27 PM    Specimen: BLOOD  Result Value Ref Range Status   Specimen Description BLOOD BLOOD RIGHT ARM  Final   Special Requests   Final    BOTTLES DRAWN AEROBIC AND ANAEROBIC Blood Culture adequate volume   Culture   Final    NO GROWTH 5 DAYS Performed at [facility], [address], [place], [place] [address]    Report Status 04/08/2020 FINAL  Final  Urine Culture     Status: Abnormal   Collection Time: 04/03/20  4:00 PM   Specimen: Urine, Random  Result Value Ref Range Status   Specimen Description   Final    URINE, RANDOM Performed at [facility], [address] [place], [place] [address]    Special Requests   Final    NONE Performed at [facility], [address], [place], [place] [address]    Culture (A)  Final    >=100,000 COLONIES/mL ESCHERICHIA COLI >=100,000 COLONIES/mL AEROCOCCUS URINAE Standardized susceptibility testing for this organism is not available. Performed at [facility], [place] [address] [place], [place] [address]    Report Status  04/07/2020 FINAL  Final   Organism ID, Bacteria ESCHERICHIA COLI (A)  Final      Susceptibility   Escherichia coli - MIC*    AMPICILLIN 4 SENSITIVE Sensitive     CEFAZOLIN <=4 SENSITIVE Sensitive     CEFEPIME <=0.12 SENSITIVE Sensitive     CEFTRIAXONE <=0.25 SENSITIVE Sensitive     CIPROFLOXACIN <=0.25 SENSITIVE Sensitive     GENTAMICIN <=1 SENSITIVE Sensitive     IMIPENEM <=0.25 SENSITIVE Sensitive     NITROFURANTOIN 32 SENSITIVE Sensitive     TRIMETH/SULFA <=20 SENSITIVE Sensitive     AMPICILLIN/SULBACTAM <=2 SENSITIVE Sensitive     PIP/TAZO <=4 SENSITIVE Sensitive     * >=100,000 COLONIES/mL ESCHERICHIA COLI  Culture, blood (Routine X 2) w Reflex to ID Panel     Status: None   Collection Time: 04/03/20 10:36 PM   Specimen: BLOOD  Result Value Ref Range Status   Specimen Description BLOOD BLOOD LEFT HAND  Final   Special Requests   Final    BOTTLES DRAWN AEROBIC AND ANAEROBIC Blood  Culture adequate volume   Culture   Final    NO GROWTH 5 DAYS Performed at [facility], [address] [place], [place] [address]    Report Status 04/08/2020 FINAL  Final  Urine Culture     Status: None   Collection Time: 04/08/20  9:52 AM   Specimen: Urine, Random  Result Value Ref Range Status   Specimen Description   Final    URINE, RANDOM Performed at [facility], [address], [place], [place] [address]    Special Requests   Final    NONE Performed at [facility], [address], [place], [place] [address]    Culture   Final    NO GROWTH Performed at [facility], [place] [address] [place], [place] [address]    Report Status 04/09/2020 FINAL  Final  CULTURE, BLOOD (ROUTINE X 2) w Reflex to ID Panel     Status: None   Collection Time: 04/08/20 10:02 AM   Specimen: BLOOD  Result Value Ref Range Status   Specimen Description BLOOD BLOOD RIGHT HAND  Final   Special Requests   Final    BOTTLES DRAWN AEROBIC AND ANAEROBIC Blood Culture results may not be optimal due to an inadequate volume of blood received in culture bottles   Culture   Final    NO GROWTH 5 DAYS Performed at [facility], [address] [place], [place] [address]    Report Status 04/13/2020 FINAL  Final  MRSA PCR Screening     Status: None   Collection Time: 04/08/20 10:04 AM   Specimen: Nasal Mucosa; Nasopharyngeal  Result Value Ref Range Status   MRSA by PCR NEGATIVE NEGATIVE Final    Comment:        The GeneXpert MRSA Assay (FDA approved for NASAL specimens only), is one component of a comprehensive MRSA colonization surveillance program. It is not intended to diagnose MRSA infection nor to guide or monitor treatment for MRSA infections. Performed at [facility], [address] [place], [place] [address]   CULTURE, BLOOD (ROUTINE X 2) w Reflex to ID Panel     Status: None   Collection Time: 04/08/20 10:26 AM   Specimen: BLOOD  Result  Value Ref Range Status   Specimen Description BLOOD BLOOD RIGHT HAND  Final   Special Requests   Final    BOTTLES DRAWN AEROBIC AND ANAEROBIC Blood  Culture results may not be optimal due to an inadequate volume of blood received in culture bottles   Culture   Final    NO GROWTH 5 DAYS Performed at [facility], [address] [place], [place] [address]    Report Status 04/13/2020 FINAL  Final  Urine Culture     Status: None   Collection Time: 04/10/20  6:21 PM   Specimen: Urine, Random  Result Value Ref Range Status   Specimen Description   Final    URINE, RANDOM Performed at [facility], [address], [place], [place] [address]    Special Requests   Final    NONE Performed at [facility], [address], [place], [place] [address]    Culture   Final    NO GROWTH Performed at [facility], [place] [address], [place], [place] [address]    Report Status 04/11/2020 FINAL  Final  MRSA PCR Screening     Status: None   Collection Time: 04/12/20  3:25 PM   Specimen: Nasopharyngeal  Result Value Ref Range Status   MRSA by PCR NEGATIVE NEGATIVE Final    Comment:        The GeneXpert MRSA Assay (FDA approved for NASAL specimens only), is one component of a comprehensive MRSA colonization surveillance program. It is not intended to diagnose MRSA infection nor to guide or monitor treatment for MRSA infections. Performed at [facility], [address] [place], [place] [address]     Coagulation Studies: No results for input(s): LABPROT, INR in the last 72 hours.  Urinalysis: Recent Labs    04/10/20 1821  COLORURINE YELLOW*  LABSPEC 1.017  PHURINE 6.0  GLUCOSEU 50*  HGBUR MODERATE*  BILIRUBINUR NEGATIVE  KETONESUR NEGATIVE  PROTEINUR >=300*  NITRITE NEGATIVE  LEUKOCYTESUR TRACE*      Imaging: CT CHEST ABDOMEN PELVIS W CONTRAST  Result Date: 04/12/2020 CLINICAL DATA:  Shortness of breath, abdominal pain, and  bilateral leg swelling. EXAM: CT CHEST, ABDOMEN, AND PELVIS WITH CONTRAST TECHNIQUE: Multidetector CT imaging of the chest, abdomen and pelvis was performed following the standard protocol during bolus administration of intravenous contrast. CONTRAST:  161mL OMNIPAQUE IOHEXOL 300 MG/ML  SOLN COMPARISON:  Right upper quadrant ultrasound from yesterday. CTA chest dated April 07, 2020. FINDINGS: CT CHEST FINDINGS Cardiovascular: Unchanged borderline cardiomegaly. No pericardial effusion. No thoracic aortic aneurysm or dissection. Coronary, aortic arch, and branch vessel atherosclerotic vascular disease. 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 No central pulmonary embolism. Mediastinum/Nodes: Unchanged mildly enlarged precarinal lymph node measuring 12 mm in short axis, stable since September 2020, likely reactive. No enlarged hilar or axillary lymph nodes. Prior thyroidectomy. Prominent circumferential wall thickening of the proximal and mid esophagus, slightly worsened since the prior study. Lungs/Pleura: Slightly increased small right pleural effusion. New trace left pleural effusion. Prominent atelectasis within the right lower lobe with patchy areas of non enhancement and air bronchograms consistent with underlying pneumonia, slightly worsened when compared to prior study. Increased patchy consolidation in both posterior upper lobes. Minimal subsegmental atelectasis in the left lower lobe. Moderate centrilobular and mild paraseptal emphysema again noted with bullous changes in the medial left lung apex. No pneumothorax. 7 mm subpleural nodule in the left upper lobe (series 4, image 39), similar to most recent chest CTs, but slightly more conspicuous when compared to CT chest from September 2020. Musculoskeletal: No acute or significant osseous findings. Unchanged chronic  T12 compression deformity. Old left eighth rib  fracture. CT ABDOMEN PELVIS FINDINGS Hepatobiliary: Slightly lobular liver contour. No focal liver abnormality is seen. No gallstones, gallbladder wall thickening, or biliary dilatation. Pancreas: Unremarkable. No pancreatic ductal dilatation or surrounding inflammatory changes. Spleen: Normal in size without focal abnormality. Adrenals/Urinary Tract: Chronic bilateral adrenal hypertrophy. No renal calculi, focal lesion, or hydronephrosis. The bladder is unremarkable. Stomach/Bowel: Stomach is within normal limits. Appendix appears normal. No evidence of bowel wall thickening, distention, or inflammatory changes. Prominent sigmoid colonic diverticulosis. Vascular/Lymphatic: Aortic atherosclerosis. 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 No enlarged abdominal or pelvic lymph nodes. Reproductive: Uterus and bilateral adnexa are unremarkable. Other: Scattered trace ascites. No pneumoperitoneum. Small fat containing paraumbilical hernias. Musculoskeletal: No acute or significant osseous findings. IMPRESSION: CT chest: 1. Mildly worsened multifocal pneumonia. 2. Slightly increased small right pleural effusion. New trace left pleural effusion. 3. 7 mm subpleural nodule in the left upper lobe, similar to most recent chest CTs, but slightly more conspicuous when compared to CT chest from September 2020. While this may be infectious or inflammatory, attention on follow-up imaging is recommended. 4. Prominent circumferential wall thickening of the proximal and mid esophagus, slightly worsened since the prior study, concerning for esophagitis. 5. 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 6. Aortic Atherosclerosis (ICD10-I70.0) and Emphysema (ICD10-J43.9). CT abdomen and pelvis: 1. No acute intra-abdominal process. 2. Slightly lobular liver contour with scattered trace ascites, suggestive of cirrhosis. 3. Severe stenosis of  the proximal left common iliac artery. Electronically Signed   By: Titus Dubin M.D.   On: 04/12/2020 13:51     Medications:   . sodium chloride 10 mL/hr at 04/13/20 0546  . ampicillin-sulbactam (UNASYN) IV 1.5 g (04/13/20 1301)   . amLODipine  10 mg Oral Daily  . vitamin C  1,000 mg Oral Daily  . chlorhexidine  15 mL Mouth Rinse BID  . Chlorhexidine Gluconate Cloth  6 each Topical Daily  . feeding supplement (GLUCERNA SHAKE)  237 mL Oral TID BM  . fluticasone  1 spray Each Nare BID  . furosemide  20 mg Oral Daily  . insulin aspart  0-5 Units Subcutaneous QHS  . insulin aspart  0-9 Units Subcutaneous TID WC  . insulin glargine  20 Units Subcutaneous Daily  . ipratropium-albuterol  3 mL Nebulization TID  . levothyroxine  150 mcg Oral Q0600  . losartan  100 mg Oral Daily  . mouth rinse  15 mL Mouth Rinse q12n4p  . methylPREDNISolone (SOLU-MEDROL) injection  20 mg Intravenous Q12H  . metoprolol tartrate  50 mg Oral BID  . [START ON 04/14/2020] multivitamin with minerals  1 tablet Oral Daily  . pantoprazole  40 mg Oral Daily  . potassium chloride  40 mEq Oral BID  . primidone  50 mg Oral BID  . rosuvastatin  5 mg Oral Daily  . [START ON 04/14/2020] sertraline  50 mg Oral Daily  . sodium chloride flush  3 mL Intravenous Q12H  . vitamin B-12  1,000 mcg Oral Daily   sodium chloride, acetaminophen, acetaminophen, albuterol, calcium carbonate, dextromethorphan-guaiFENesin, fluticasone, labetalol, nitroGLYCERIN, ondansetron (ZOFRAN) IV, sodium chloride flush  Assessment/ Plan:  74 y.o. female  Ms. PRISTINE GLADHILL is a 74 y.o. 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  #Proteinuria, 17 g.   # Nephrotic syndrome  with anasarca.    Patient is awaiting renal biopsy when acute respiratory problems resolve Urine output 2,065 ml for the preceding  24 hours Patient is on low dose Furosemide PO  #Hypokalemia - secondary to loop diuretics and renal losses.  Potassium normalized, 4.1 today On Po potassium supplements  #Metabolic alkalosis with compensated respiratory acidosis and with metabolic acidosis CO2 improved, 35 today  #Acute hypoxic respiratory failure  With right lower lobe pneumonia.  Patient is on Unasyn  Respiratory status improved, but still requiring 10L of supplemental O2 via high flow nasal cannula  #Hypertension Blood pressure readings slightly above goal  Receiving  losartan, metoprolol, furosemide and amlodipine  #Diabetes mellitus type 2 Lab Results  Component Value Date   HGBA1C 6.5 (H) 04/03/2020  Continue Insulin Aspart and Insulin Glargine     LOS: 10 Melicia Esqueda 12/15/20213:06 PM

## 2020-04-13 NOTE — Progress Notes (Addendum)
Initial Nutrition Assessment  DOCUMENTATION CODES:   Not applicable  INTERVENTION:   Glucerna Shake po TID, each supplement provides 220 kcal and 10 grams of protein  MVI daily   Pt at high refeed risk; recommend monitor potassium, magnesium and phosphorus labs daily as oral intake improves.   NUTRITION DIAGNOSIS:   Increased nutrient needs related to chronic illness (COPD, CHF) as evidenced by estimated needs.  GOAL:   Patient will meet greater than or equal to 90% of their needs  MONITOR:   PO intake,Supplement acceptance,Labs,Weight trends,Skin,I & O's  REASON FOR ASSESSMENT:   LOS    ASSESSMENT:   74 y.o. female with medical history significant of  HTN, HLD, DM, COPD, tobacco abuse, hypothyroidism, gout, depression, tremor, proteinuria, depression, anxiety, GERD and diverticulitis who presents with bilateral leg edema and SOB.  RD working remotely.  Unable to reach pt by phone. Per chart review, pt is eating 25% of meals in hospital. Pt with encephalopathy which is improving. Pt found to have PNA, possibly from aspiration. Pt seen by SLP who recommended dysphagia 2/thin liquid diet. RD will add supplements and MVI to help pt meet her estimated needs. Per chart, pt is down 31lbs since admit and appears to be down ~16lbs(10%) from her UBW. Pt is noted to have anasarca so unsure of pt's true weight. RD will obtain nutrition related exam and history at follow-up. Of note, pt noted to have new diarrhea; this appears to be improving per chart review. CT scan revealed no acute intra-abdominal process.   Medications reviewed and include: vitamin C, lasix, insulin, synthroid, solu-medrol, protonix, KCl, B12, unasyn  Labs reviewed: BUN 30(H) Wbc- 16.6(H) cbgs- 350, 149 x 24 hrs AIC 6.5(H)- 12/5  NUTRITION - FOCUSED PHYSICAL EXAM: Unable to perform at this time   Diet Order:   Diet Order            DIET DYS 2 Room service appropriate? Yes with Assist; Fluid consistency:  Thin  Diet effective now                EDUCATION NEEDS:   No education needs have been identified at this time  Skin:  Skin Assessment: Reviewed RN Assessment (ecchymosis)  Last BM:  12/14- type 6  Height:   Ht Readings from Last 1 Encounters:  04/09/20 5\' 4"  (1.626 m)    Weight:   Wt Readings from Last 1 Encounters:  04/13/20 68.4 kg    Ideal Body Weight:  54.5 kg  BMI:  Body mass index is 25.9 kg/m.  Estimated Nutritional Needs:   Kcal:  1500-1700kcal/day  Protein:  75-85g/day  Fluid:  1.4-1.6L/day  Koleen Distance MS, RD, LDN Please refer to [facility] for RD and/or RD on-call/weekend/after hours pager

## 2020-04-13 NOTE — Progress Notes (Signed)
[facility] Cardiology    SUBJECTIVE: the patient denies chest pain, shortness of breath (on supplemental oxygen via [place]), or palpitations. She has no complaints at this time.   Vitals:   04/12/20 2044 04/13/20 0448 04/13/20 0530 04/13/20 0806  BP: (!) 148/72  (!) 149/60   Pulse: 68  86   Resp:      Temp: 98.7 F (37.1 C)  98.9 F (37.2 C)   TempSrc: Oral  Oral   SpO2: 93%  (!) 60% 92%  Weight:  68.4 kg    Height:         Intake/Output Summary (Last 24 hours) at 04/13/2020 0835 Last data filed at 04/13/2020 2119 Gross per 24 hour  Intake 766.38 ml  Output 2065 ml  Net -1298.62 ml      PHYSICAL EXAM  General: ill-appearing elderly female lying nearly flat in bed in no acute distress HEENT:  Normocephalic and atramatic Neck:  No JVD.  Lungs: decreased breath sounds throughout, no wheezing, normal effort of breathing on O2 via [place] Heart: HRRR . Normal S1 and S2 without gallops or murmurs.  Msk:  No obvious deformities Extremities: mild lower extremity edema, ecchymosis of both hands   Neuro: Alert and oriented X 3. Psych:  Good affect, responds appropriately   LABS: Basic Metabolic Panel: Recent Labs    04/12/20 0623 04/13/20 0441  NA 142 141  K 3.1* 4.1  CL 84* 95*  CO2 44* 35*  GLUCOSE 328* 206*  BUN 37* 30*  CREATININE 0.55 0.55  CALCIUM 9.3 8.9  MG 2.2 2.0   Liver Function Tests: Recent Labs    04/12/20 0623 04/13/20 0441  AST 25 18  ALT 29 27  ALKPHOS 84 77  BILITOT 2.2* 1.6*  PROT 6.6 6.3*  ALBUMIN 4.1 3.8   No results for input(s): LIPASE, AMYLASE in the last 72 hours. CBC: Recent Labs    04/12/20 0623 04/13/20 0441  WBC 16.2* 16.6*  HGB 15.7* 14.7  HCT 51.5* 48.3*  MCV 91.8 92.0  PLT 179 186   Cardiac Enzymes: No results for input(s): CKTOTAL, CKMB, CKMBINDEX, TROPONINI in the last 72 hours. BNP: Invalid input(s): POCBNP D-Dimer: No results for input(s): DDIMER in the last 72 hours. Hemoglobin A1C: No results for input(s): HGBA1C  in the last 72 hours. Fasting Lipid Panel: No results for input(s): CHOL, HDL, LDLCALC, TRIG, CHOLHDL, LDLDIRECT in the last 72 hours. Thyroid Function Tests: No results for input(s): TSH, T4TOTAL, T3FREE, THYROIDAB in the last 72 hours.  Invalid input(s): FREET3 Anemia Panel: No results for input(s): VITAMINB12, FOLATE, FERRITIN, TIBC, IRON, RETICCTPCT in the last 72 hours.  CT CHEST ABDOMEN PELVIS W CONTRAST  Result Date: 04/12/2020 CLINICAL DATA:  Shortness of breath, abdominal pain, and bilateral leg swelling. EXAM: CT CHEST, ABDOMEN, AND PELVIS WITH CONTRAST TECHNIQUE: Multidetector CT imaging of the chest, abdomen and pelvis was performed following the standard protocol during bolus administration of intravenous contrast. CONTRAST:  134mL OMNIPAQUE IOHEXOL 300 MG/ML  SOLN COMPARISON:  Right upper quadrant ultrasound from yesterday. CTA chest dated April 07, 2020. FINDINGS: CT CHEST FINDINGS Cardiovascular: Unchanged borderline cardiomegaly. No pericardial effusion. No thoracic aortic aneurysm or dissection. Coronary, aortic arch, and branch vessel atherosclerotic vascular disease. 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 No central pulmonary embolism. Mediastinum/Nodes: Unchanged mildly enlarged precarinal lymph node measuring 12 mm in short axis, stable since September 2020, likely reactive. No enlarged hilar or axillary lymph nodes.  Prior thyroidectomy. Prominent circumferential wall thickening of the proximal and mid esophagus, slightly worsened since the prior study. Lungs/Pleura: Slightly increased small right pleural effusion. New trace left pleural effusion. Prominent atelectasis within the right lower lobe with patchy areas of non enhancement and air bronchograms consistent with underlying pneumonia, slightly worsened when compared to prior study. Increased patchy consolidation in both  posterior upper lobes. Minimal subsegmental atelectasis in the left lower lobe. Moderate centrilobular and mild paraseptal emphysema again noted with bullous changes in the medial left lung apex. No pneumothorax. 7 mm subpleural nodule in the left upper lobe (series 4, image 39), similar to most recent chest CTs, but slightly more conspicuous when compared to CT chest from September 2020. Musculoskeletal: No acute or significant osseous findings. Unchanged chronic T12 compression deformity. Old left eighth rib fracture. CT ABDOMEN PELVIS FINDINGS Hepatobiliary: Slightly lobular liver contour. No focal liver abnormality is seen. No gallstones, gallbladder wall thickening, or biliary dilatation. Pancreas: Unremarkable. No pancreatic ductal dilatation or surrounding inflammatory changes. Spleen: Normal in size without focal abnormality. Adrenals/Urinary Tract: Chronic bilateral adrenal hypertrophy. No renal calculi, focal lesion, or hydronephrosis. The bladder is unremarkable. Stomach/Bowel: Stomach is within normal limits. Appendix appears normal. No evidence of bowel wall thickening, distention, or inflammatory changes. Prominent sigmoid colonic diverticulosis. Vascular/Lymphatic: Aortic atherosclerosis. 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 No enlarged abdominal or pelvic lymph nodes. Reproductive: Uterus and bilateral adnexa are unremarkable. Other: Scattered trace ascites. No pneumoperitoneum. Small fat containing paraumbilical hernias. Musculoskeletal: No acute or significant osseous findings. IMPRESSION: CT chest: 1. Mildly worsened multifocal pneumonia. 2. Slightly increased small right pleural effusion. New trace left pleural effusion. 3. 7 mm subpleural nodule in the left upper lobe, similar to most recent chest CTs, but slightly more conspicuous when compared to CT chest from September 2020. While this may be infectious or inflammatory, attention on follow-up imaging is recommended. 4.  Prominent circumferential wall thickening of the proximal and mid esophagus, slightly worsened since the prior study, concerning for esophagitis. 5. 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 6. Aortic Atherosclerosis (ICD10-I70.0) and Emphysema (ICD10-J43.9). CT abdomen and pelvis: 1. No acute intra-abdominal process. 2. Slightly lobular liver contour with scattered trace ascites, suggestive of cirrhosis. 3. 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 Electronically Signed   By: Titus Dubin M.D.   On: 04/12/2020 13:51   US Abdomen Limited RUQ (LIVER/GB)  Result Date: 04/11/2020 CLINICAL DATA:  Hyperbilirubinemia in a 74 year old female EXAM: ULTRASOUND ABDOMEN LIMITED RIGHT UPPER QUADRANT COMPARISON:  April 07, 2020 CT angiography of the chest and CT of the chest of April 06, 2020 FINDINGS: Gallbladder: Wall thickness at upper limits of normal. No visible pericholecystic fluid or evidence of cholelithiasis. No reported tenderness over the gallbladder. Common bile duct: Diameter: 2.2 mm Liver: Mildly heterogeneous echotexture with nodular contour and suggestion of fissural widening particularly on image 22. No visible lesion on submitted images. Portal vein is patent on color Doppler imaging with normal direction of blood flow towards the liver. Other: Trace ascites IMPRESSION: 1. Heterogeneous, mildly heterogeneous hepatic echotexture with question of lobular contour and fissural widening, findings could be seen in the setting of early liver disease. 2. Gallbladder wall thickness at upper limits of normal could be seen in the setting of liver disease and is nonspecific, not associated with biliary calculi, reported tenderness or biliary duct dilation. Electronically Signed   By: Jewel Baize.D.  On: 04/11/2020 12:23     Echo LVEF 55-60%  TELEMETRY: sinus rhythm, 85 bpm  ASSESSMENT AND  PLAN:  Principal Problem:   Acute CHF (congestive heart failure) (HCC) Active Problems:   COPD exacerbation (HCC)   Diabetes mellitus without complication ([place])   Current tobacco use   Hyperlipidemia   Essential hypertension   Hypothyroidism   Gout   Depression   Tremors of nervous system   Acute respiratory failure with hypoxia (HCC)   Cellulitis of lower extremity   Acute pulmonary edema (HCC)   Elevated brain natriuretic peptide (BNP) level   Nephrotic syndrome    1. Acute on chronic diastolic congestive heart failure,presenting with shortness of breath and bilateral lower extremity edema,LVEF 55 to 60% by 2D echocardiogram, previously on Lasix drip, furosemide 40 mg IV as needed 2.Acute on chronic hypoxic respiratory failure, multifactorial, secondary to CHF, and right lower lobe pneumonia, followed by pulmonary, O2 by nasal cannula 3.Encephalopathy, improved 4.Nephrotic syndrome, followed by nephrology 5.Essential hypertension,on amlodipine, metoprolol tartrate, and losartan,blood pressure remains mildly elevated 6. COPD exacerbation with tobacco use history 7. Diarrhea and abdominal pain, now followed by infectious disease  Recommendations: 1. Recommend maintenance dose of oral Lasix 20 mg daily 2. Continue metoprolol tartrate 50 mg BID 3. Defer further cardiac diagnostics at this time. 4. Follow-up with Dr. Saralyn Pilar 1-2 weeks after discharge  Sign off for now; please call/haiku with any questions. Plan made in collaboration with Dr. Saralyn Pilar.   Clabe Seal, PA-C 04/13/2020 8:35 AM

## 2020-04-13 NOTE — Progress Notes (Addendum)
Pt is complaining of indigestion around belly button 8/10 described by the pt as burning feeling.Tums was given at 1730 this afternoon but states it still hurts. Notify NP Randol Kern. Will continue to monitor.  Update 2223: NP Randol Kern ordered Maalox/Mylanta 200-200-20 mg 5 ml suspension 30 ml, once. Will continue to monitor.  Update 2234: NP Randol Kern added occult blood sample. Will continue to monitor.

## 2020-04-13 NOTE — Progress Notes (Signed)
Speech Language Pathology Treatment: Dysphagia  Patient Details Name: Martha [name] MRN: [id number] DOB: 28-Jun-1945 Today's Date: 04/13/2020 Time: 1115-1200 SLP Time Calculation (min) (ACUTE ONLY): 45 min  Assessment / Plan / Recommendation Clinical Impression  Pt seen for f/u w/ toleration of oral diet; trials to resume diet post episode of N/V/D per NSG yesterday. Pt was made NPO d/t the episode. She currently wants to eat/drink; noted she was swallowing pills w/ NSG upon entering room. Family member present supporting her. Noted min decreased awareness and need for verbal cues for follow through w/ tasks, po tasks.  Pt appears to present w/ grossly adequate oropharyngeal phase swallow w/ No significant oropharyngeal phase dysphagia noted, No neuromuscular deficits noted. Pt consumed po trials w/ No overt, clinical s/s of aspiration during po trials. Pt appears at reduced risk for aspiration from an oropharyngeal phase standpoint following general aspiration precautions. However, pt does exhibit significant deconditioning, min WOB w/ any exertion, and min+ decreased awareness in general impacting her follow through w/ tasks -- this could impact her swallowing safety. She appeared min Impulsive when drinking liquids and required verbal cues to slow down -- impulsivity can increase risk for aspiration, choking.  During po trials, pt consumed all consistencies given w/ no immediate, overt coughing, decline in vocal quality, or change in respiratory presentation during/post trials. O2 sats remained mid90s. Encouraged pt to slow down and to take Rest Breaks to lessen any WOB w/ exertion of po tasks, risk for aspiration. Oral phase appeared [facility] w/ timely bolus management and control of bolus propulsion for A-P transfer for swallowing. Pt had to use extra Time for mashing and gumming the foods d/t Edentulous status - she does not wear a Denture plate per family member report. Oral clearing achieved w/ all  trial consistencies given Time. Pt helped to feed self w/ setup support. Of note, pt exhibited decreased awareness during Pill swallowing w/ NSG in room attempting to use Puree w/ pills Whole -- she then even orally held pills when given sips of water to swallow them. SLP had pt expectorate pills and "start over" w/ sips of water which she then was able to clear them.  Recommend a Minced diet consistency, moistened foods; Thin liquids. Recommend general aspiration precautions - monitor straw use and drink SLOWLY, REDUCE Distrations during meals. Pills Crushed or whole in Puree for safer, easier swallowing as needed if any swallowing issues noted w/ water use. Also educated pt and family member on Rest Breaks and eating/drinking SLOWLY during meals for conservation of energy and reducing risk for aspiration. Education given on food consistencies and easy to eat options; general aspiration and Reflux precautions. Handouts given. ST services will montior pt's status during admission; NSG to immediately reconsult if any new needs arise while admitted. NSG agreed. MD updated.    HPI HPI: Pt  is a 74 y.o. female with a medical history significant for Multiple issues including GERD, restless leg syndrome, essential tremors, HTN, DM, COPD, tobacco use, gout, depression/anxiety and chronic lower extremity edema/cellulitis.  Patient presented to the [facility] ED [facility]/03/20 for leg edema and worsening shortness of breath and was found with O2 saturations in the 80's. She was found to have a BNP of 1113 and imaging showed mild cardiomegaly and pulmonary edema. Her lab work also showed worsening proteinuria. She was admitted for further work up.  per Pulmonary, pt has dx of Acute on chronic hypoxemic respiratory failure; BiPAP at night, on increased [place] O2 support during the  day.  Pt has some degree of Cognitive decline - decreased attention and follow through w/ tasks w/out cues; often calling out Loudly.  CXR: Unchanged multifocal  patchy airspace opacities within both lungs  which could be due to multifocal pneumonia.      SLP Plan  Continue with current plan of care       Recommendations  Diet recommendations: Dysphagia 2 (fine chop);Thin liquid Liquids provided via: Cup;Straw (monitor) Medication Administration: Whole meds with liquid (or Crushed/Whole in puree IF needed) Supervision: Patient able to self feed;Staff to assist with self feeding;Intermittent supervision to cue for compensatory strategies Compensations: Minimize environmental distractions;Slow rate;Small sips/bites;Lingual sweep for clearance of pocketing;Multiple dry swallows after each bite/sip;Follow solids with liquid Postural Changes and/or Swallow Maneuvers: Seated upright 90 degrees;Upright 30-60 min after meal;Out of bed for meals                General recommendations:  (Dietician f/u for support) Oral Care Recommendations: Oral care BID;Oral care before and after PO;Staff/trained caregiver to provide oral care Follow up Recommendations: None (TBD) SLP Visit Diagnosis: Dysphagia, oral phase (R13.11) (Edentulous; min decreased awareness also) Plan: Continue with current plan of care       [place], [place], [place] Pathologist Rehab Services [phone] [facility] 04/13/2020, 3:51 PM

## 2020-04-13 NOTE — Hospital Course (Addendum)
From H&P: "Martha [name] is a 74 y.o. female with medical history significant of  HTN, HLD, DM, COPD, tobacco abuse, hypothyroidism, gout, depression, tremor, proteinuria, depression, anxiety, GERD, diverticulitis, who presents with bilateral leg edema and pain, and SOB.   Patient states that she has chronic bilateral leg edema which has been progressively worsened recently. Both lower legs are painful and erythematous. She states that she gets her lower extremities wrapped once a week.  She has chronic cough and shortness of breath which has slightly worsened on exertion.  She coughs up some white mucus.  No fever or chills.  No chest pain.  On presentation she was hypoxic requiring BiPAP, later transitioned to 4 L of oxygen. Patient also has an history of proteinuria which is being worked up by nephrology as an outpatient and they are planning for biopsy.   Patient had pertinent labs positive for BNP of 1113, troponin and COVID-19 negative.  Chest x-ray with borderline cardiomegaly and mild pulmonary edema.  Significant edema of bilateral lower extremities with signs of venous congestion, skin peeling and few shallow ulcers.  Cardiology was consulted."  Pt required Bipap again and close monitoring in step down unit on 12/11.  Was resumed on antibiotics for suspected aspiration pneumonia and steroids.  Patient has remained encephalopathic, aspirating, fairly significant oxygen requirement.   12/22 -family met with palliative care and have decided on pursuing hospice care at home.

## 2020-04-14 ENCOUNTER — Ambulatory Visit: Payer: Medicare Other | Admitting: Family

## 2020-04-14 DIAGNOSIS — N049 Nephrotic syndrome with unspecified morphologic changes: Secondary | ICD-10-CM

## 2020-04-14 DIAGNOSIS — G9340 Encephalopathy, unspecified: Secondary | ICD-10-CM

## 2020-04-14 DIAGNOSIS — R809 Proteinuria, unspecified: Secondary | ICD-10-CM

## 2020-04-14 LAB — COMPREHENSIVE METABOLIC PANEL
ALT: 41 U/L (ref 0–44)
AST: 35 U/L (ref 15–41)
Albumin: 3.8 g/dL (ref 3.5–5.0)
Alkaline Phosphatase: 118 U/L (ref 38–126)
Anion gap: 11 (ref 5–15)
BUN: 29 mg/dL — ABNORMAL HIGH (ref 8–23)
CO2: 35 mmol/L — ABNORMAL HIGH (ref 22–32)
Calcium: 9.2 mg/dL (ref 8.9–10.3)
Chloride: 99 mmol/L (ref 98–111)
Creatinine, Ser: 0.47 mg/dL (ref 0.44–1.00)
GFR, Estimated: >60 mL/min (ref >60)
Glucose, Bld: 173 mg/dL — ABNORMAL HIGH (ref 70–99)
Potassium: 4.3 mmol/L (ref 3.5–5.1)
Sodium: 145 mmol/L (ref 135–145)
Total Bilirubin: 1.3 mg/dL — ABNORMAL HIGH (ref 0.3–1.2)
Total Protein: 6.3 g/dL — ABNORMAL LOW (ref 6.5–8.1)

## 2020-04-14 LAB — GLUCOSE, CAPILLARY
Glucose-Capillary: 191 mg/dL — ABNORMAL HIGH (ref 70–99)
Glucose-Capillary: 243 mg/dL — ABNORMAL HIGH (ref 70–99)
Glucose-Capillary: 235 mg/dL — ABNORMAL HIGH (ref 70–99)
Glucose-Capillary: 338 mg/dL — ABNORMAL HIGH (ref 70–99)
Glucose-Capillary: 248 mg/dL — ABNORMAL HIGH (ref 70–99)

## 2020-04-14 LAB — BLOOD GAS, ARTERIAL
Acid-Base Excess: 11.9 mmol/L — ABNORMAL HIGH (ref 0.0–2.0)
Bicarbonate: 37.8 mmol/L — ABNORMAL HIGH (ref 20.0–28.0)
FIO2: 0.48
O2 Saturation: 93.1 %
Patient temperature: 37
pCO2 arterial: 52 mmHg — ABNORMAL HIGH (ref 32.0–48.0)
pH, Arterial: 7.47 — ABNORMAL HIGH (ref 7.350–7.450)
pO2, Arterial: 63 mmHg — ABNORMAL LOW (ref 83.0–108.0)

## 2020-04-14 LAB — CBC
HCT: 47.3 % — ABNORMAL HIGH (ref 36.0–46.0)
Hemoglobin: 14.9 g/dL (ref 12.0–15.0)
MCH: 28.5 pg (ref 26.0–34.0)
MCHC: 31.5 g/dL (ref 30.0–36.0)
MCV: 90.6 fL (ref 80.0–100.0)
Platelets: 188 10*3/uL (ref 150–400)
RBC: 5.22 MIL/uL — ABNORMAL HIGH (ref 3.87–5.11)
RDW: 16.7 % — ABNORMAL HIGH (ref 11.5–15.5)
WBC: 14.1 10*3/uL — ABNORMAL HIGH (ref 4.0–10.5)
nRBC: 0 % (ref 0.0–0.2)

## 2020-04-14 LAB — MAGNESIUM: Magnesium: 2.3 mg/dL (ref 1.7–2.4)

## 2020-04-14 MED ORDER — ENOXAPARIN SODIUM 40 MG/0.4ML ~~LOC~~ SOLN
40.0000 mg | SUBCUTANEOUS | Status: DC
  Administered 2020-04-14 – 2020-04-19 (×6): 40 mg via SUBCUTANEOUS
  Filled 2020-04-14 (×6): qty 0.4

## 2020-04-14 NOTE — Consult Note (Addendum)
WOC Nurse Consult Note: Pt was wearing Una boots prior to admission; removed Una boots to assess legs and re-apply compression wraps.  Legs are slowly improving with decreased edema. Reason for Consult:Left posterior leg with full thickness wound is red and moist with mod amt yellow drainage. Calcium alginate dressing applied.  Right leg with patchy scattered areas of red moist partial thickness wounds Applied Una boots and coban to BLE. Dressing procedure/placement/frequency:Topical treatment orders provided for bedside nurses as follows:Leave Una boots on to BLE; [place] nurses wil change Q Mon/Thurs while in the hospital.Pt will need to resume home health for dressing changes after discharge. Julien Girt MSN, RN, [place], [place], [place]

## 2020-04-14 NOTE — Progress Notes (Signed)
Pulmonary and Critical Care  Medicine          Date: 04/14/2020,   MRN# [id number] Martha [name] August 03, 1945     AdmissionWeight: 72 kg                 CurrentWeight: 67.9 kg  Referring physician: Dr. Si Raider    CHIEF COMPLAINT:   Acute on chronic hypoxemic respiratory failure   HISTORY OF PRESENT ILLNESS   74 year old female with history of diabetes COPD lifelong smoking hypothyroidism gout major depressive disorder, anxiety disorder and GERD, diverticulitis who came in with signs and symptoms of anasarca including peripheral edema and pulmonary edema.  Patient states is been worse than usual despite compliance with wrapping the lower extremities.  She also reported coughing and dyspnea.  Upon presentation to the emergency room she was found to be desaturating in the mid 80s and was placed on 4 L supplemental oxygen with some subsequent improvement of SPO2.  She was then treated with BiPAP and felt better.  She was initially admitted with acute exacerbation of CHF and cardiology was consulted for this.  She was also found to have proteinuria with CKD and had renal consultation placed. Pulmonary consultation placed for acute on chronic hypoxemic respiratory failure with failure to wean down oxygen.  Patient did have CT PE protocol done to rule out acute pulmonary venous embolism which was negative however did show bullous emphysema with overlying pulmonary edema as well as a right lower lobe consolidated infiltrate suggestive of pneumonia as well as right hemidiaphragm elevation due to hepatomegaly.  04/09/20- patient seen at bedside, remains encephalopathic.  Discussed with [facility] attending Dr Si Raider plan for transfer to SDU.     04/10/20- patient is improved.  She is more awake, she was able to speak with me despite BIPAP mask, she has normal 4/4 grip bilaterally and moves LE to verbal communication without encouragment. Mentation has significantly improved. She is flushed red on  examination.    04/11/20- patient is improved.  She is off BIPAP during my evaluation.  She is able to speak and reported right upper extermity pain, this may be related to K infusion. I dicussed this with RN who has already addressed this with IV team. ABG is in progress. Cardiology and nephrology following, UOP is low.   04/13/20- patient is improved.  She is speaking in full sentences. I spoke to her common law husband to review hospital course and care plan.  Patient will need BIPAP for home, ive discussed case with [facility] team and qualification for NIV is in process.  Spirometry is in process. 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  She smokes actively , I have provided smoking cessation counseling today and will be following up on outpatient for cessation therapy.  LE swelling is resolved and sensorium is close to baseline. There is no diarreah  04/14/20- patient resting in bed. Mildly confused. Breathing is diminished bilaterllly , on 8L HF bubbler. ABG tonight.   PAST MEDICAL HISTORY   Past Medical History:  Diagnosis Date  . Abnormal LFTs (liver function tests)   . Anxiety   . Arthritis    knees, feet  . Asthma   . Chronic bronchitis ([place])   . Chronic constipation   . Chronic low back pain   . Cigarette smoker    Has cut back Smoking to 1 pack every other day  . COPD (chronic obstructive pulmonary disease) ([place])   . Depression   .  Diabetic neuropathy ([place])   . Diverticulitis 2015   gi recommended repeat scope in 10 years  . Essential hypertension   . Fatty liver   . GERD (gastroesophageal reflux disease)   . Gout   . History of mammogram 05/28/2013  . Hyperlipidemia   . Hypothyroidism   . Insomnia   . Iron deficiency anemia   . Low serum vitamin D   . Personal history of tobacco use, presenting hazards to health 09/29/2015  . Plantar fasciitis   . RLS (restless legs syndrome)   . Shortness of breath dyspnea   . Thyroid cancer ([place]) 2009  . Tremors of  nervous system    "I think I have parkinson's disease"  . Type 2 diabetes mellitus ([place])   . Wears dentures    uppers     SURGICAL HISTORY   Past Surgical History:  Procedure Laterality Date  . CATARACT EXTRACTION W/PHACO Left 11/24/2015   Procedure: CATARACT EXTRACTION PHACO AND INTRAOCULAR LENS PLACEMENT (IOC);  Surgeon: Birder Robson, MD;  Location: [facility] ORS;  Service: Ophthalmology;  Laterality: Left;  [place] 38.3 AP% 20.6 CDE 7.89 FLUID PACK LOT # [id number]  . CATARACT EXTRACTION W/PHACO Right 06/23/2019   Procedure: CATARACT EXTRACTION PHACO AND INTRAOCULAR LENS PLACEMENT ([place]) RIGHT DIABETIC;  Surgeon: Birder Robson, MD;  Location: [place];  Service: Ophthalmology;  Laterality: Right;  Diabetic  . COLONOSCOPY  2004, 2009, 2015   [place]  . POLYPECTOMY  2009  . THYROIDECTOMY  2009   states she was tx with radiactive iodine  . TUBAL LIGATION    . UPPER GASTROINTESTINAL ENDOSCOPY       FAMILY HISTORY   Family History  Problem Relation Age of Onset  . Diabetes Mellitus II Father   . Lung disease Father   . Diabetes Mellitus II Mother   . Arthritis Mother   . Thyroid disease Other   . Asthma Sister   . Emphysema Sister   . Hypertension Sister   . Breast cancer Paternal Aunt      SOCIAL HISTORY   Social History   Tobacco Use  . Smoking status: Current Every Day Smoker    Packs/day: 1.00    Years: 39.50    Pack years: 39.50    Types: Cigarettes  . Smokeless tobacco: Never Used  . Tobacco comment: curently smokes 12 cigerattes a day. I'm trying to cut back.  Vaping Use  . Vaping Use: Never used  Substance Use Topics  . Alcohol use: No    Alcohol/week: 0.0 standard drinks  . Drug use: No     MEDICATIONS    Home Medication:    Current Medication:  Current Facility-Administered Medications:  .  0.9 %  sodium chloride infusion, 250 mL, Intravenous, PRN, Ivor Costa, MD, Stopped at 04/14/20 0100 .  acetaminophen (TYLENOL) suppository 650  mg, 650 mg, Rectal, Q6H PRN, Lang Snow, NP, 650 mg at 04/08/20 2234 .  acetaminophen (TYLENOL) tablet 650 mg, 650 mg, Oral, Q6H PRN, Lorella Nimrod, MD, 650 mg at 04/13/20 1126 .  albuterol (PROVENTIL) (2.5 MG/3ML) 0.083% nebulizer solution 2.5 mg, 2.5 mg, Nebulization, Q4H PRN, Ivor Costa, MD .  amLODipine (NORVASC) tablet 10 mg, 10 mg, Oral, Daily, Wouk, Ailene Rud, MD, 10 mg at 04/14/20 0845 .  ampicillin-sulbactam (UNASYN) 1.5 g in sodium chloride 0.9 % 100 mL IVPB, 1.5 g, Intravenous, Q6H, Ravishankar, Jayashree, MD, Last Rate: 200 mL/hr at 04/14/20 0648, 1.5 g at 04/14/20 0648 .  ascorbic acid (VITAMIN C)  tablet 1,000 mg, 1,000 mg, Oral, Daily, Ivor Costa, MD, 1,000 mg at 04/14/20 0844 .  calcium carbonate (TUMS - dosed in mg elemental calcium) chewable tablet 200 mg of elemental calcium, 1 tablet, Oral, Q8H PRN, Nicole Kindred A, DO, 200 mg of elemental calcium at 04/13/20 1732 .  chlorhexidine (PERIDEX) 0.12 % solution 15 mL, 15 mL, Mouth Rinse, BID, Wouk, Ailene Rud, MD, 15 mL at 04/14/20 0845 .  Chlorhexidine Gluconate Cloth 2 % PADS 6 each, 6 each, Topical, Daily, Wouk, Ailene Rud, MD, 6 each at 04/14/20 [phone] .  dextromethorphan-guaiFENesin (MUCINEX DM) 30-600 MG per 12 hr tablet 1 tablet, 1 tablet, Oral, BID PRN, Ivor Costa, MD, 1 tablet at 04/06/20 0945 .  enoxaparin (LOVENOX) injection 40 mg, 40 mg, Subcutaneous, Q24H, Griffith, Kelly A, DO .  feeding supplement (GLUCERNA SHAKE) (GLUCERNA SHAKE) liquid 237 mL, 237 mL, Oral, TID BM, Griffith, Kelly A, DO, 237 mL at 04/14/20 0840 .  fluticasone (FLONASE) 50 MCG/ACT nasal spray 1 spray, 1 spray, Each Nare, BID, Sharion Settler, NP, 1 spray at 04/14/20 0847 .  fluticasone (FLONASE) 50 MCG/ACT nasal spray 1-2 spray, 1-2 spray, Each Nare, Daily PRN, Ivor Costa, MD .  insulin aspart (novoLOG) injection 0-5 Units, 0-5 Units, Subcutaneous, QHS, Ivor Costa, MD, 2 Units at 04/13/20 2156 .  insulin aspart (novoLOG) injection 0-9  Units, 0-9 Units, Subcutaneous, TID WC, Ivor Costa, MD, 2 Units at 04/14/20 [phone]  insulin glargine (LANTUS) injection 20 Units, 20 Units, Subcutaneous, Daily, Si Raider, Ailene Rud, MD, 20 Units at 04/14/20 [phone]  ipratropium-albuterol (DUONEB) 0.5-2.5 (3) MG/3ML nebulizer solution 3 mL, 3 mL, Nebulization, TID, Ivor Costa, MD, 3 mL at 04/14/20 0810 .  labetalol (NORMODYNE) injection 20 mg, 20 mg, Intravenous, Q2H PRN, Wouk, Ailene Rud, MD, 20 mg at 04/12/20 1529 .  levothyroxine (SYNTHROID) tablet 150 mcg, 150 mcg, Oral, Q0600, Ivor Costa, MD, 150 mcg at 04/14/20 0645 .  losartan (COZAAR) tablet 100 mg, 100 mg, Oral, Daily, Wouk, Ailene Rud, MD, 100 mg at 04/14/20 0843 .  MEDLINE mouth rinse, 15 mL, Mouth Rinse, q12n4p, Wouk, Ailene Rud, MD, 15 mL at 04/13/20 1131 .  metoprolol tartrate (LOPRESSOR) tablet 50 mg, 50 mg, Oral, BID, Paraschos, Alexander, MD, 50 mg at 04/14/20 0843 .  multivitamin with minerals tablet 1 tablet, 1 tablet, Oral, Daily, Ezekiel Slocumb, DO, 1 tablet at 04/14/20 0842 .  nitroGLYCERIN (NITROSTAT) SL tablet 0.4 mg, 0.4 mg, Sublingual, Q5 min PRN, Wouk, Ailene Rud, MD, 0.4 mg at 04/08/20 0659 .  ondansetron (ZOFRAN) injection 4 mg, 4 mg, Intravenous, Q8H PRN, Ivor Costa, MD, 4 mg at 04/14/20 0904 .  pantoprazole (PROTONIX) EC tablet 40 mg, 40 mg, Oral, Daily, Nicole Kindred A, DO, 40 mg at 04/14/20 0844 .  predniSONE (DELTASONE) tablet 40 mg, 40 mg, Oral, Q breakfast, Eshani Maestre, MD, 40 mg at 04/14/20 0842 .  primidone (MYSOLINE) tablet 50 mg, 50 mg, Oral, BID, Nicole Kindred A, DO, 50 mg at 04/14/20 0845 .  rosuvastatin (CRESTOR) tablet 5 mg, 5 mg, Oral, Daily, Ivor Costa, MD, 5 mg at 04/14/20 0844 .  sertraline (ZOLOFT) tablet 50 mg, 50 mg, Oral, Daily, Nicole Kindred A, DO, 50 mg at 04/14/20 0843 .  sodium chloride flush (NS) 0.9 % injection 3 mL, 3 mL, Intravenous, Q12H, Ivor Costa, MD, 3 mL at 04/14/20 0846 .  sodium chloride flush (NS) 0.9 % injection  3 mL, 3 mL, Intravenous, PRN, Ivor Costa, MD .  vitamin B-12 (CYANOCOBALAMIN)  tablet 1,000 mcg, 1,000 mcg, Oral, Daily, Ivor Costa, MD, 1,000 mcg at 04/14/20 0845    ALLERGIES   Nicotine polacrilex, Nicotrol [nicotine], and Codeine sulfate     REVIEW OF SYSTEMS    Review of Systems:  Unable to obtain due to encephalopathy   VS: BP (!) 176/69 (BP Location: Right Arm)   Pulse 68   Temp 98 F (36.7 C)   Resp 16   Ht $R'5\' 4"'JV$  (1.626 m)   Wt 67.9 kg   SpO2 97%   BMI 25.68 kg/m      PHYSICAL EXAM    GENERAL:NAD, no fevers, chills, no weakness no fatigue HEAD: Normocephalic, atraumatic.  EYES: Pupils equal, round, reactive to light. Extraocular muscles intact. No scleral icterus.  MOUTH: dry mucosal membrane. Dentition intact. No abscess noted. On BIPAP EAR, NOSE, THROAT: Clear without exudates. No external lesions.  NECK: Supple. No thyromegaly. No nodules. No JVD.  PULMONARY: mild rhonchi bilaterally  CARDIOVASCULAR: S1 and S2. Regular rate and rhythm. No murmurs, rubs, or gallops. No edema. Pedal pulses 2+ bilaterally.  GASTROINTESTINAL: Soft, nontender, nondistended. No masses. Positive bowel sounds. No hepatosplenomegaly.  MUSCULOSKELETAL: No swelling, clubbing, +edema 2++.  NEUROLOGIC: GCS 10  SKIN: No ulceration, lesions, rashes, or cyanosis. Skin warm and dry. Turgor intact.       IMAGING    DG Chest 1 View  Result Date: 04/09/2020 CLINICAL DATA:  Acute respiratory failure EXAM: CHEST  1 VIEW COMPARISON:  04/03/2016 FINDINGS: Normal cardiac silhouette. Interval increase in interstitial edema pattern. No focal consolidation. No pneumothorax. IMPRESSION: Worsening interstitial edema. Electronically Signed   By: Suzy Bouchard M.D.   On: 04/09/2020 06:31   DG Chest 2 View  Result Date: 04/03/2020 CLINICAL DATA:  Hypoxia. Leg edema and shortness of breath. Pt daughter states that pt is supposed to be getting a renal bx but they are not able to do it because  of the edema in pts legs. Pt states that she does feel short of breath. Pt does not wear oxygen at home. Pt placed on O2 in triage due to sats being in the 80s. Hx of asthma, chronic bronchitis, COPD, thyroid cancer- 2009, DM, current smoker. EXAM: CHEST - 2 VIEW COMPARISON:  01/04/2018 FINDINGS: Cardiac silhouette is borderline enlarged. Stable aortic atherosclerotic calcifications. No mediastinal or hilar masses. No evidence of adenopathy. Lungs demonstrate diffuse bilateral irregular interstitial thickening which has increased compared to the prior study. There is also linear/reticular scarring in the left lung base, which is stable. No lung consolidation. No pleural effusion and no pneumothorax. Skeletal structures are demineralized, but grossly intact. IMPRESSION: 1. Bilateral irregular interstitial thickening, increased compared to the prior study, along with borderline cardiomegaly. Suspect mild congestive heart failure. Consider diffuse interstitial infection or inflammation in the proper clinical setting. No evidence of lobar pneumonia. Electronically Signed   By: Lajean Manes M.D.   On: 04/03/2020 13:06   CT CHEST W CONTRAST  Result Date: 04/06/2020 CLINICAL DATA:  Hemoptysis. EXAM: CT CHEST WITH CONTRAST TECHNIQUE: Multidetector CT imaging of the chest was performed during intravenous contrast administration. CONTRAST:  58mL OMNIPAQUE IOHEXOL 300 MG/ML  SOLN COMPARISON:  January 06, 2019 FINDINGS: Cardiovascular: There is marked severity calcification of the aortic arch. There is mild cardiomegaly. No pericardial effusion. Mediastinum/Nodes: There is mild pretracheal and right hilar lymphadenopathy. Thyroid gland, trachea, and esophagus demonstrate no significant findings. Lungs/Pleura: Mild-to-moderate severity emphysematous lung disease is seen involving the bilateral upper lobes with stable bullous disease is seen along  the medial aspect of the left apex. Mild to moderate severity areas of  atelectasis and/or infiltrate are seen within the posterior aspects of the bilateral upper lobes and left lung base. Moderate to marked severity posterior right basilar atelectasis and/or infiltrate is noted. A very small right pleural effusion is seen. No pneumothorax is identified. Upper Abdomen: There is stable hepatosplenomegaly with stable, moderate severity diffuse bilateral adrenal gland enlargement. Musculoskeletal: A chronic lateral eighth left rib fracture is seen. A chronic compression fracture deformity of the T12 vertebral body is noted. IMPRESSION: 1. Mild-to-moderate severity posterior bilateral upper lobe and left basilar atelectasis and/or infiltrate. 2. Moderate to marked severity posterior right basilar atelectasis and/or infiltrate. 3. Very small right pleural effusion. 4. Mild cardiomegaly. 5. Stable emphysematous lung disease. 6. Chronic lateral eighth left rib fracture. 7. Chronic compression fracture deformity of the T12 vertebral body. 8. Aortic atherosclerosis. Aortic Atherosclerosis (ICD10-I70.0) and Emphysema (ICD10-J43.9). Electronically Signed   By: Virgina Norfolk M.D.   On: 04/06/2020 15:33   CT ANGIO CHEST PE W OR WO CONTRAST  Result Date: 04/07/2020 CLINICAL DATA:  Shortness of breath EXAM: CT ANGIOGRAPHY CHEST WITH CONTRAST TECHNIQUE: Multidetector CT imaging of the chest was performed using the standard protocol during bolus administration of intravenous contrast. Multiplanar CT image reconstructions and MIPs were obtained to evaluate the vascular anatomy. CONTRAST:  10mL OMNIPAQUE IOHEXOL 350 MG/ML SOLN COMPARISON:  April 06, 2020 FINDINGS: Cardiovascular: There is a optimal opacification of the pulmonary arteries. There is no central,segmental, or subsegmental filling defects within the pulmonary arteries. There is unchanged cardiomegaly. Mitral valve calcifications and coronary artery calcifications are seen. No pericardial effusion or thickening. No evidence right  heart strain. There is normal three-vessel brachiocephalic anatomy without proximal stenosis. Scattered aortic atherosclerosis is noted. Mediastinum/Nodes: No hilar, mediastinal, or axillary adenopathy. Thyroid gland, trachea, and esophagus demonstrate no significant findings. Lungs/Pleura: Patchy airspace opacity seen at the left upper lung and posterior right lung base with air bronchograms. There are trace bilateral pleural effusions present. Centrilobular and paraseptal emphysematous changes seen at both lung apices. Upper Abdomen: No acute abnormalities present in the visualized portions of the upper abdomen. Musculoskeletal: No chest wall abnormality. No acute or significant osseous findings. There is a chronic anterior wedge compression deformity of the T12 vertebral body with 50% loss in vertebral body height. Review of the MIP images confirms the above findings. IMPRESSION: No central, segmental, or subsegmental pulmonary embolism. Unchanged multifocal patchy airspace opacities within both lungs which could be due to multifocal pneumonia Trace bilateral pleural effusions, right greater than left. Aortic Atherosclerosis (ICD10-I70.0). Electronically Signed   By: Prudencio Pair M.D.   On: 04/07/2020 19:43   CT CHEST ABDOMEN PELVIS W CONTRAST  Result Date: 04/12/2020 CLINICAL DATA:  Shortness of breath, abdominal pain, and bilateral leg swelling. EXAM: CT CHEST, ABDOMEN, AND PELVIS WITH CONTRAST TECHNIQUE: Multidetector CT imaging of the chest, abdomen and pelvis was performed following the standard protocol during bolus administration of intravenous contrast. CONTRAST:  118mL OMNIPAQUE IOHEXOL 300 MG/ML  SOLN COMPARISON:  Right upper quadrant ultrasound from yesterday. CTA chest dated April 07, 2020. FINDINGS: CT CHEST FINDINGS Cardiovascular: Unchanged borderline cardiomegaly. No pericardial effusion. No thoracic aortic aneurysm or dissection. Coronary, aortic arch, and branch vessel atherosclerotic  vascular disease. 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 No central pulmonary embolism. Mediastinum/Nodes: Unchanged mildly enlarged precarinal lymph node measuring 12 mm in short axis, stable since September 2020, likely  reactive. No enlarged hilar or axillary lymph nodes. Prior thyroidectomy. Prominent circumferential wall thickening of the proximal and mid esophagus, slightly worsened since the prior study. Lungs/Pleura: Slightly increased small right pleural effusion. New trace left pleural effusion. Prominent atelectasis within the right lower lobe with patchy areas of non enhancement and air bronchograms consistent with underlying pneumonia, slightly worsened when compared to prior study. Increased patchy consolidation in both posterior upper lobes. Minimal subsegmental atelectasis in the left lower lobe. Moderate centrilobular and mild paraseptal emphysema again noted with bullous changes in the medial left lung apex. No pneumothorax. 7 mm subpleural nodule in the left upper lobe (series 4, image 39), similar to most recent chest CTs, but slightly more conspicuous when compared to CT chest from September 2020. Musculoskeletal: No acute or significant osseous findings. Unchanged chronic T12 compression deformity. Old left eighth rib fracture. CT ABDOMEN PELVIS FINDINGS Hepatobiliary: Slightly lobular liver contour. No focal liver abnormality is seen. No gallstones, gallbladder wall thickening, or biliary dilatation. Pancreas: Unremarkable. No pancreatic ductal dilatation or surrounding inflammatory changes. Spleen: Normal in size without focal abnormality. Adrenals/Urinary Tract: Chronic bilateral adrenal hypertrophy. No renal calculi, focal lesion, or hydronephrosis. The bladder is unremarkable. Stomach/Bowel: Stomach is within normal limits. Appendix appears normal. No evidence of bowel wall thickening,  distention, or inflammatory changes. Prominent sigmoid colonic diverticulosis. Vascular/Lymphatic: Aortic atherosclerosis. 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 No enlarged abdominal or pelvic lymph nodes. Reproductive: Uterus and bilateral adnexa are unremarkable. Other: Scattered trace ascites. No pneumoperitoneum. Small fat containing paraumbilical hernias. Musculoskeletal: No acute or significant osseous findings. IMPRESSION: CT chest: 1. Mildly worsened multifocal pneumonia. 2. Slightly increased small right pleural effusion. New trace left pleural effusion. 3. 7 mm subpleural nodule in the left upper lobe, similar to most recent chest CTs, but slightly more conspicuous when compared to CT chest from September 2020. While this may be infectious or inflammatory, attention on follow-up imaging is recommended. 4. Prominent circumferential wall thickening of the proximal and mid esophagus, slightly worsened since the prior study, concerning for esophagitis. 5. 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 6. Aortic Atherosclerosis (ICD10-I70.0) and Emphysema (ICD10-J43.9). CT abdomen and pelvis: 1. No acute intra-abdominal process. 2. Slightly lobular liver contour with scattered trace ascites, suggestive of cirrhosis. 3. 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 Electronically Signed   By: Obie Dredge M.D.   On: 04/12/2020 13:51   US Venous Img Lower Bilateral (DVT)  Result Date: 04/03/2020 CLINICAL DATA:  Lower leg cellulitis EXAM: BILATERAL LOWER EXTREMITY VENOUS DOPPLER ULTRASOUND TECHNIQUE: Gray-scale sonography with graded compression, as well as color Doppler and duplex ultrasound were performed to evaluate the lower extremity deep venous systems from the level of the common femoral vein and including the common femoral, femoral, profunda femoral, popliteal and calf veins including  the posterior tibial, peroneal and gastrocnemius veins when visible. The superficial great saphenous vein was also interrogated. Spectral Doppler was utilized to evaluate flow at rest and with distal augmentation maneuvers in the common femoral, femoral and popliteal veins. COMPARISON:  None. FINDINGS: RIGHT LOWER EXTREMITY Common Femoral Vein: No evidence of thrombus. Normal compressibility, respiratory phasicity and response to augmentation. Saphenofemoral Junction: No evidence of thrombus. Normal compressibility and flow on color Doppler imaging. Profunda Femoral Vein: No evidence of thrombus. Normal compressibility and flow on color Doppler imaging. Femoral Vein: No evidence of thrombus. Normal compressibility, respiratory phasicity and response to augmentation. Popliteal Vein:  Not well visualized due to overlying edema. Calf Veins: Not well visualized due to overlying edema. Superficial Great Saphenous Vein: No evidence of thrombus. Normal compressibility. Venous Reflux:  None. Other Findings:  None. LEFT LOWER EXTREMITY Common Femoral Vein: No evidence of thrombus. Normal compressibility, respiratory phasicity and response to augmentation. Saphenofemoral Junction: No evidence of thrombus. Normal compressibility and flow on color Doppler imaging. Profunda Femoral Vein: No evidence of thrombus. Normal compressibility and flow on color Doppler imaging. Femoral Vein: No evidence of thrombus. Normal compressibility, respiratory phasicity and response to augmentation. Popliteal Vein: Not well visualized due to overlying edema. Calf Veins: Not well visualized due to overlying edema. Superficial Great Saphenous Vein: No evidence of thrombus. Normal compressibility. Venous Reflux:  None. Other Findings:  None. IMPRESSION: Somewhat limited exam due to peripheral edema although no central deep venous thrombosis is noted. Electronically Signed   By: Inez Catalina M.D.   On: 04/03/2020 15:58   ECHOCARDIOGRAM  COMPLETE  Result Date: 04/04/2020    ECHOCARDIOGRAM REPORT   Patient Name:   OLIVIAH AGOSTINI Date of Exam: 04/04/2020 Medical Rec #:  [id number]      Height:       64.0 in Accession #:    [id number]     Weight:       181.8 lb Date of Birth:  [birth year]     BSA:          1.879 m Patient Age:    52 years       BP:           171/78 mmHg Patient Gender: F              HR:           98 bpm. Exam Location:  [facility] Procedure: 2D Echo, Color Doppler, Cardiac Doppler and Intracardiac            Opacification Agent Indications:     I50.31 CHF-Acute Diastolic  History:         Patient has no prior history of Echocardiogram examinations.                  COPD, Signs/Symptoms:Shortness of Breath; Risk Factors:Diabetes                  and Current Smoker.  Sonographer:     Charmayne Sheer RDCS (AE) Referring Phys:  Baker Janus [facility] Diagnosing Phys: Bartholome Bill MD  Sonographer Comments: Technically difficult study due to poor echo windows. Image acquisition challenging due to patient body habitus and Image acquisition challenging due to COPD. IMPRESSIONS  1. Left ventricular ejection fraction, by estimation, is 55 to 60%. The left ventricle has normal function. The left ventricle has no regional wall motion abnormalities. Left ventricular diastolic parameters are consistent with Grade I diastolic dysfunction (impaired relaxation).  2. Right ventricular systolic function is normal. The right ventricular size is mildly enlarged.  3. Left atrial size was mildly dilated.  4. Right atrial size was mildly dilated.  5. The mitral valve was not well visualized. Trivial mitral valve regurgitation.  6. The aortic valve was not well visualized. Aortic valve regurgitation is trivial. FINDINGS  Left Ventricle: Left ventricular ejection fraction, by estimation, is 55 to 60%. The left ventricle has normal function. The left ventricle has no regional wall motion abnormalities. Definity contrast agent was given IV to delineate the left ventricular   endocardial borders. The left ventricular internal cavity size was normal in size. There is borderline  left ventricular hypertrophy. Left ventricular diastolic parameters are consistent with Grade I diastolic dysfunction (impaired relaxation). Right Ventricle: The right ventricular size is mildly enlarged. No increase in right ventricular wall thickness. Right ventricular systolic function is normal. Left Atrium: Left atrial size was mildly dilated. Right Atrium: Right atrial size was mildly dilated. Pericardium: There is no evidence of pericardial effusion. Mitral Valve: The mitral valve was not well visualized. Trivial mitral valve regurgitation. MV peak gradient, 7.4 mmHg. The mean mitral valve gradient is 3.0 mmHg. Tricuspid Valve: The tricuspid valve is not well visualized. Tricuspid valve regurgitation is trivial. Aortic Valve: The aortic valve was not well visualized. Aortic valve regurgitation is trivial. Aortic valve mean gradient measures 3.0 mmHg. Aortic valve peak gradient measures 6.8 mmHg. Aortic valve area, by VTI measures 2.46 cm. Pulmonic Valve: The pulmonic valve was not well visualized. Pulmonic valve regurgitation is not visualized. Aorta: The aortic root was not well visualized. IAS/Shunts: The interatrial septum was not assessed.  LEFT VENTRICLE PLAX 2D LVIDd:         4.15 cm     Diastology LVIDs:         3.64 cm     LV e' medial:    5.98 cm/s LV PW:         1.43 cm     LV E/e' medial:  18.3 LV IVS:        1.12 cm     LV e' lateral:   6.53 cm/s LVOT diam:     2.00 cm     LV E/e' lateral: 16.8 LV SV:         55 LV SV Index:   29 LVOT Area:     3.14 cm  LV Volumes (MOD) LV vol d, MOD A2C: 55.5 ml LV vol d, MOD A4C: 63.8 ml LV vol s, MOD A2C: 34.3 ml LV vol s, MOD A4C: 23.4 ml LV SV MOD A2C:     21.2 ml LV SV MOD A4C:     63.8 ml LV SV MOD BP:      34.2 ml RIGHT VENTRICLE RV Basal diam:  3.76 cm LEFT ATRIUM             Index       RIGHT ATRIUM           Index LA diam:        4.90 cm 2.61 cm/m   RA Area:     19.80 cm LA Vol (A2C):   59.5 ml 31.67 ml/m RA Volume:   61.10 ml  32.52 ml/m LA Vol (A4C):   57.5 ml 30.61 ml/m LA Biplane Vol: 61.2 ml 32.58 ml/m  AORTIC VALVE                   PULMONIC VALVE AV Area (Vmax):    2.51 cm    PV Vmax:       0.76 m/s AV Area (Vmean):   2.43 cm    PV Vmean:      53.200 cm/s AV Area (VTI):     2.46 cm    PV VTI:        0.126 m AV Vmax:           130.00 cm/s PV Peak grad:  2.3 mmHg AV Vmean:          83.500 cm/s PV Mean grad:  1.0 mmHg AV VTI:            0.225 m AV Peak  Grad:      6.8 mmHg AV Mean Grad:      3.0 mmHg LVOT Vmax:         104.00 cm/s LVOT Vmean:        64.700 cm/s LVOT VTI:          0.176 m LVOT/AV VTI ratio: 0.78  AORTA Ao Root diam: 3.10 cm MITRAL VALVE                TRICUSPID VALVE MV Area (PHT): 3.00 cm     TR Peak grad:   28.9 mmHg MV Peak grad:  7.4 mmHg     TR Vmax:        269.00 cm/s MV Mean grad:  3.0 mmHg MV Vmax:       1.36 m/s     SHUNTS MV Vmean:      86.9 cm/s    Systemic VTI:  0.18 m MV Decel Time: 253 msec     Systemic Diam: 2.00 cm MV E velocity: 109.50 cm/s MV A velocity: 137.50 cm/s MV E/A ratio:  0.80 Bartholome Bill MD Electronically signed by Bartholome Bill MD Signature Date/Time: 04/04/2020/4:54:02 PM    Final    US Abdomen Limited RUQ (LIVER/GB)  Result Date: 04/11/2020 CLINICAL DATA:  Hyperbilirubinemia in a 74 year old female EXAM: ULTRASOUND ABDOMEN LIMITED RIGHT UPPER QUADRANT COMPARISON:  April 07, 2020 CT angiography of the chest and CT of the chest of April 06, 2020 FINDINGS: Gallbladder: Wall thickness at upper limits of normal. No visible pericholecystic fluid or evidence of cholelithiasis. No reported tenderness over the gallbladder. Common bile duct: Diameter: 2.2 mm Liver: Mildly heterogeneous echotexture with nodular contour and suggestion of fissural widening particularly on image 22. No visible lesion on submitted images. Portal vein is patent on color Doppler imaging with normal direction of blood flow  towards the liver. Other: Trace ascites IMPRESSION: 1. Heterogeneous, mildly heterogeneous hepatic echotexture with question of lobular contour and fissural widening, findings could be seen in the setting of early liver disease. 2. Gallbladder wall thickness at upper limits of normal could be seen in the setting of liver disease and is nonspecific, not associated with biliary calculi, reported tenderness or biliary duct dilation. Electronically Signed   By: Zetta Bills M.D.   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  -12/15- DCD lasix , DCD solumedrol , started PO prednisone taper at $Remove'40mg'qdfCmBq$  po daily  04/14/20- patient on bubbler 8L.   Altered mental status with confusion-imporoved  -severe hypercapnic 3encephalopathy -Meds have been adjusted and mentation is improved -will obtain ABG to evaluate hypercanic encephalopathy -continue on BIPAP -[facility] - BIPAP qualifiacation - spirometry in process s/p ABG       Thank you for allowing me to participate in the care of this patient.    Patient/Family are satisfied with care plan and all questions have been answered.   This document was prepared using Dragon voice recognition software and may include unintentional dictation errors.     Ottie Glazier, M.D.  Division of Pulmonary &  [place]

## 2020-04-14 NOTE — Care Management Important Message (Signed)
Important Message  Patient Details  Name: Martha [name] MRN: [id number] Date of Birth: 1946-01-03   Medicare Important Message Given:  Yes     Dannette Barbara 04/14/2020, 1:30 PM

## 2020-04-14 NOTE — Evaluation (Signed)
Physical Therapy Evaluation Patient Details Name: Martha [name] MRN: [id number] DOB: [birth year] Today's Date: 04/14/2020   History of Present Illness  Martha [name] is a 57yoF who comes to [facility] on [facility]20 c BLE, pain, SOB. PMH: HTN, HLD, DM, COPD, tobacco abuse, hypoTSH, gout, depression, tremore, proteinuria, GAD, GERD, diverticulitis.  Clinical Impression  Pt admitted with above diagnosis. Pt currently with functional limitations due to the deficits listed below (see "PT Problem List"). Upon entry, pt in bed, awake and agreeable to participate. HFNC doffed (still around neck) SpO2 82-83%, improves after author assists with donning (pt confused). The pt is drowsy, pleasant, interactive with delay, but not able to provide much detail regarding prior level of function. Examination reveals global weakness in 4 limbs, heavy physical assistance to get into or out of bed. Pt is weak, does not tolerate sustained sitting for more than a couple minutes. Patient's performance this date reveals decreased ability, independence, and tolerance in performing all basic mobility required for performance of activities of daily living. Pt requires additional DME, close physical assistance, and cues for safe participate in mobility. Pt will benefit from skilled PT intervention to increase independence and safety with basic mobility in preparation for discharge to the venue listed below.       Follow Up Recommendations SNF    Equipment Recommendations  None recommended by PT    Recommendations for Other Services       Precautions / Restrictions Precautions Precautions: Fall Restrictions Weight Bearing Restrictions: No Other Position/Activity Restrictions: b/l LEs very swollen, painful, open wounds, weeping, wrapped in coban, elevated      Mobility  Bed Mobility Overal bed mobility: Needs Assistance Bed Mobility: Supine to Sit;Sit to Supine     Supine to sit: Mod assist;HOB elevated Sit to supine:  Max assist   General bed mobility comments: MAX A to manage LEs for back to bed    Transfers Overall transfer level:  (deferred at this time due to cognition; pt requires MaxA earlier in day with OT.) Equipment used: 1 person hand held assist Transfers: Sit to/from Stand Sit to Stand: Max assist;From elevated surface         General transfer comment: requires MAX A x3 trials to come to partial stand from elevated EOB surface with arm in arm technique. Pt is noted to still have hips flexed and only moderately clears bottom from bed surface.  Ambulation/Gait                Stairs            Wheelchair Mobility    Modified Rankin (Stroke Patients Only)       Balance Overall balance assessment: Needs assistance Sitting-balance support: Bilateral upper extremity supported;Feet unsupported Sitting balance-Leahy Scale: Poor Sitting balance - Comments: F static sitting with UE support to sustain balance     Standing balance-Leahy Scale: Zero Standing balance comment: MAX A with external support as well as bilateral UEs supported.                             Pertinent Vitals/Pain Pain Assessment: Faces Pain Score: 4  Faces Pain Scale: Hurts little more Pain Location: back side of calves of b/l LEs, pain increases with any/all movement Pain Descriptors / Indicators: Jabbing;Tightness Pain Intervention(s): Limited activity within patient's tolerance;Repositioned    Home Living Family/patient expects to be discharged to:: Skilled nursing facility Living Arrangements: Spouse/significant other   Type of  Home: House         Home Equipment: Walker - 4 wheels;Bedside commode;Cane - single point;Shower seat Additional Comments: has a walk in shower    Prior Function Level of Independence: Needs assistance   Gait / Transfers Assistance Needed: Pt has been less and less able to get around in the home recently, wound care/una boots for the last few  months  ADL's / Homemaking Assistance Needed: pt confused at time of re-evaluation. Pt's previous therapy notes and pt's daughter confirms: reports her husband has to assist with bathing, dressing  Comments: Pt's daughter present in room at time of re-evaluation. She reports that pt was completely indep until ~3 weeks before admission and reports that pt's spouse was performing almost all aspects of care and helping mobilize.     Hand Dominance   Dominant Hand: Right    Extremity/Trunk Assessment   Upper Extremity Assessment Upper Extremity Assessment: Generalized weakness (grossly 4/5)    Lower Extremity Assessment Lower Extremity Assessment: Generalized weakness (difficulty with Left SLR;)       Communication   Communication: No difficulties  Cognition Arousal/Alertness: Lethargic Behavior During Therapy: WFL for tasks assessed/performed Overall Cognitive Status: Within Functional Limits for tasks assessed                                 General Comments: Pt drowsy to somnolent, delayed response time, pt has difficulty answering all orientation questions correctly the first attempt. Remain somewhat confused.      General Comments      Exercises Other Exercises Other Exercises: OT facilitates ed with pt and daughter who is present throughout re: importance of OOB Activity to reduce risk for further skin breakdown, muscle atrophy or respiratory infection. Pt with minimal understanding of education confirmed. Pt's daughter very attentive and supportive throughout and demos great carryover.   Assessment/Plan    PT Assessment Patient needs continued PT services  PT Problem List Decreased activity tolerance;Decreased safety awareness;Decreased strength;Decreased range of motion;Decreased balance;Decreased mobility;Pain;Decreased knowledge of use of DME;Decreased skin integrity       PT Treatment Interventions DME instruction;Gait training;Functional mobility  training;Therapeutic activities;Therapeutic exercise;Balance training;Neuromuscular re-education;Patient/family education    PT Goals (Current goals can be found in the Care Plan section)  Acute Rehab PT Goals Patient Stated Goal: to get stronger and better able to move PT Goal Formulation: With patient Time For Goal Achievement: 04/28/20 Potential to Achieve Goals: Fair    Frequency Min 2X/week   Barriers to discharge        Co-evaluation               AM-PAC PT "6 Clicks" Mobility  Outcome Measure Help needed turning from your back to your side while in a flat bed without using bedrails?: Total Help needed moving from lying on your back to sitting on the side of a flat bed without using bedrails?: Total Help needed moving to and from a bed to a chair (including a wheelchair)?: Total Help needed standing up from a chair using your arms (e.g., wheelchair or bedside chair)?: Total Help needed to walk in hospital room?: Total Help needed climbing 3-5 steps with a railing? : Total 6 Click Score: 6    End of Session Equipment Utilized During Treatment: Oxygen (7L) Activity Tolerance: Patient limited by lethargy;Patient limited by pain Patient left: in bed;with call bell/phone within reach;with bed alarm set Nurse Communication: Mobility status PT Visit  Diagnosis: Muscle weakness (generalized) (M62.81);Difficulty in walking, not elsewhere classified (R26.2)    Time: 5486-2824 PT Time Calculation (min) (ACUTE ONLY): 17 min   Charges:   PT Evaluation $PT Eval High Complexity: 1 High          5:24 PM, 04/14/20 Etta Grandchild, PT, DPT Physical Therapist - [facility]  [phone] ([place])    Vanl[place] C 04/14/2020, 5:21 PM

## 2020-04-14 NOTE — Progress Notes (Signed)
PROGRESS NOTE    Martha [name]   XVQ:[id number]  DOB: [birth year]  PCP: Tracie Harrier, MD    DOA: 04/03/2020 LOS: 11   Brief Narrative   From H&P: "Martha [name] is a 74 y.o. female with medical history significant of  HTN, HLD, DM, COPD, tobacco abuse, hypothyroidism, gout, depression, tremor, proteinuria, depression, anxiety, GERD, diverticulitis, who presents with bilateral leg edema and pain, and SOB.   Patient states that she has chronic bilateral leg edema which has been progressively worsened recently. Both lower legs are painful and erythematous. She states that she gets her lower extremities wrapped once a week.  She has chronic cough and shortness of breath which has slightly worsened on exertion.  She coughs up some white mucus.  No fever or chills.  No chest pain.  On presentation she was hypoxic requiring BiPAP, later transitioned to 4 L of oxygen. Patient also has an history of proteinuria which is being worked up by nephrology as an outpatient and they are planning for biopsy.   Patient had pertinent labs positive for BNP of 1113, troponin and COVID-19 negative.  Chest x-ray with borderline cardiomegaly and mild pulmonary edema.  Significant edema of bilateral lower extremities with signs of venous congestion, skin peeling and few shallow ulcers.  Cardiology was consulted."  Pt required Bipap again and close monitoring in step down unit on 12/11.  Was resumed on antibiotics for suspected aspiration pneumonia and steroids.     Assessment & Plan   Principal Problem:   Acute CHF (congestive heart failure) (HCC) Active Problems:   COPD exacerbation (HCC)   Diabetes mellitus without complication ([place])   Current tobacco use   Hyperlipidemia   Essential hypertension   Hypothyroidism   Gout   Depression   Tremors of nervous system   Acute respiratory failure with hypoxia (HCC)   Cellulitis of lower extremity   Acute pulmonary edema (HCC)   Elevated brain  natriuretic peptide (BNP) level   Nephrotic syndrome  Acute respiratory failure with hypoxia and hypercapnia secondary to combination of acute on chronic diastolic CHF, COPD exacerbation, right lower lobe pneumonia?  Aspiration -was aggressively diuresed earlier this admission.  CT chest on 12/8 obtained for hemoptysis, showed atelectasis versus multifocal pneumonia.  Covid was negative on admission.  CTA later negative for PE.  Unlikely ACS as troponins very mildly elevated and flat. --Off Lasix and albumin --Supplemental O2 as needed, maintain O2 sat greater than 88%, wean as tolerated --ID and pulmonology following --Continue Unasyn --IV steroids were transitioned to prednisone 40 mg daily on 2/15 --Follow blood cultures, negative to date --Qualification for home BiPAP is underway  Dysphagia -12/14 nursing reported patient gagging and choking with medications.  Concern for aspiration pneumonia.  SLP consulted for swallow eval.  Currently on dysphagia 2 diet (edentulous), tolerating.  Acute metabolic encephalopathy -improved after BiPAP.  Patient is awake, lucid and appropriately responding to questions.  Likely multifactorial with COPD, pneumonia, uremia urinary retention contributing. 12/16 - per daughter, pt improved but not near her baseline yet --Continue antibiotics and steroids as above --Monitor  Abdominal pain /diarrhea - diarrhea has resolved.  No need to check C. difficile, can DC precautions. --Monitor abdominal pain, diet resumed per SLP dysphagia 2  Hyperbilirubinemia -elevated indirect bili, normal LFTs.  Peripheral smear, LDH, CBC without evidence of hemolysis.  RUQ ultrasound unrevealing.  Suspect due to acute illnesses.  Urinary retention -Foley was placed  Hemoptysis -resolved.  Patient had small volume blood-tinged sputum.  Suspect secondary to coughing episodes.  CTA chest showed atelectasis versus infiltrates and was negative for PE.  Nephrotic syndrome with  proteinuria - Plan for kidney biopsy once recovered from acute illness.  Previously on Lasix infusion this admission. --Nephrology following --Currently off diuretic  Bilateral lower extremity venous stasis ulcers -with bilateral Unna boots in place.  Wound care consulted.  Was treated with 5 days Rocephin/Keflex.  Continue compression therapy  Bacteriuria -patient without UTI symptoms (was unable to provide history at the time), UA was positive for infection and culture grew pansensitive E. coli.  She completed 5 days of Rocephin and Keflex.  Hypokalemia -replaced.  Monitor BMP and replace as needed  Type 2 diabetes -A1c 6.5%.  Takes Metformin and glipizide at home.  Continue sliding scale NovoLog and basal insulin.  Tobacco abuse -patient is ongoing heavy smoker.  Nicotine patch ordered.  Essential hypertension -patient's losartan was increased to 100 mg.  She was started on amlodipine.  Cardiology also added metoprolol.  Labetalol IV as needed.  Hypothyroidism -continue Synthroid  Hyperlipidemia -continue Crestor  History of gout -no acute issues  History of depression -takes Zoloft which was held due to mental status changes.  Resume Zoloft tomorrow, 12/16.  History of tremors -Home primidone has been held due to mental status changes.  Resume primidone this evening, 12/15.  Chronic pain - home oxycodone is held, was weaned off gabapentin earlier this admission due to encephalopathy.   Patient BMI: Body mass index is 25.68 kg/m.   DVT prophylaxis: enoxaparin (LOVENOX) injection 40 mg Start: 04/14/20 2200   Diet:  Diet Orders (From admission, onward)    Start     Ordered   04/13/20 1205  DIET DYS 2 Room service appropriate? Yes with Assist; Fluid consistency: Thin  Diet effective now       Comments: Please add extra Gravy on meats, potatoes. Yogurt at breakfast/lunch meals. Puddings, jello.  Question Answer Comment  Room service appropriate? Yes with Assist   Fluid  consistency: Thin      04/13/20 1205            Code Status: Full Code    Subjective 04/14/20    Patient seen this morning with daughter at bedside.  Patient working with PT, therapist notes improvement, states patient making progress.  Patient denies shortness of breath, chest pain, nausea vomiting or other complaints.  No acute events reported.   Disposition Plan & Communication   Status is: Inpatient  Remains inpatient appropriate because:IV treatments appropriate due to intensity of illness or inability to take PO   Dispo: The patient is from: Home              Anticipated d/c is to: SNF              Anticipated d/c date is: 2-3 days               Patient currently is not medically stable to d/c.   Family Communication: Daughter was at bedside on rounds today   Consults, Procedures, Significant Events   Consultants:   Nephrology  Cardiology  Infectious disease  Pulmonology  Procedures:   None  Antimicrobials:  Anti-infectives (From admission, onward)   Start     Dose/Rate Route Frequency Ordered Stop   04/13/20 0330  vancomycin (VANCOREADY) IVPB 500 mg/100 mL  Status:  Discontinued        500 mg 100 mL/hr over 60 Minutes Intravenous Every 12 hours 04/12/20 1458 04/12/20 1715  04/12/20 1815  ampicillin-sulbactam (UNASYN) 1.5 g in sodium chloride 0.9 % 100 mL IVPB        1.5 g 200 mL/hr over 30 Minutes Intravenous Every 6 hours 04/12/20 1715     04/12/20 1515  vancomycin (VANCOREADY) IVPB 1500 mg/300 mL  Status:  Discontinued        1,500 mg 150 mL/hr over 120 Minutes Intravenous  Once 04/12/20 1428 04/12/20 1715   04/08/20 2200  vancomycin (VANCOREADY) IVPB 750 mg/150 mL  Status:  Discontinued        750 mg 150 mL/hr over 60 Minutes Intravenous Every 12 hours 04/08/20 1005 04/08/20 1318   04/08/20 1100  vancomycin (VANCOREADY) IVPB 1750 mg/350 mL  Status:  Discontinued        1,750 mg 175 mL/hr over 120 Minutes Intravenous  Once 04/08/20 1005  04/08/20 1318   04/08/20 1100  azithromycin (ZITHROMAX) tablet 500 mg        500 mg Oral Daily 04/08/20 1005 04/11/20 0959   04/08/20 1015  piperacillin-tazobactam (ZOSYN) IVPB 3.375 g  Status:  Discontinued        3.375 g 12.5 mL/hr over 240 Minutes Intravenous Every 8 hours 04/08/20 1005 04/12/20 1715   04/07/20 0600  cephALEXin (KEFLEX) capsule 500 mg        500 mg Oral Every 8 hours 04/06/20 1146 04/07/20 2121   04/05/20 1000  cefTRIAXone (ROCEPHIN) 2 g in sodium chloride 0.9 % 100 mL IVPB  Status:  Discontinued        2 g 200 mL/hr over 30 Minutes Intravenous Every 24 hours 04/04/20 1525 04/06/20 1146   04/04/20 1100  cefTRIAXone (ROCEPHIN) 2 g in sodium chloride 0.9 % 100 mL IVPB  Status:  Discontinued        2 g 200 mL/hr over 30 Minutes Intravenous Every 24 hours 04/04/20 1023 04/04/20 1510   04/03/20 1500  cefTRIAXone (ROCEPHIN) 1 g in sodium chloride 0.9 % 100 mL IVPB  Status:  Discontinued        1 g 200 mL/hr over 30 Minutes Intravenous Every 24 hours 04/03/20 1445 04/04/20 1023         Objective   Vitals:   04/14/20 0754 04/14/20 0811 04/14/20 1129 04/14/20 1426  BP: (!) 162/64  (!) 176/69   Pulse: 66 68 68 72  Resp:  18 16 18   Temp: 98.2 F (36.8 C)  98 F (36.7 C)   TempSrc: Oral     SpO2: 95% 95% 97% 92%  Weight:      Height:        Intake/Output Summary (Last 24 hours) at 04/14/2020 1437 Last data filed at 04/14/2020 1130 Gross per 24 hour  Intake 1095.76 ml  Output 1925 ml  Net -829.24 ml   Filed Weights   04/09/20 1034 04/13/20 0448 04/14/20 0413  Weight: 66.9 kg 68.4 kg 67.9 kg    Physical Exam:  General exam: awake, alert, no acute distress Respiratory system: CTAB with diminished bases, normal respiratory effort, on 8 L/min HFNC. Cardiovascular system: normal S1/S2, RRR, no pedal edema.   Central nervous system: no gross focal neurologic deficits, intention tremor noted Extremities: Compression wraps on bilateral lower extremities, distal  sensation intact, normal tone Psychiatry: normal mood, congruent affect  Labs   Data Reviewed: I have personally reviewed following labs and imaging studies  CBC: Recent Labs  Lab 04/10/20 0517 04/11/20 0428 04/12/20 0623 04/13/20 0441 04/14/20 0530  WBC 11.0* 13.9* 16.2* 16.6* 14.1*  HGB  15.9* 15.3* 15.7* 14.7 14.9  HCT 51.4* 51.1* 51.5* 48.3* 47.3*  MCV 93.3 94.1 91.8 92.0 90.6  PLT 173 191 179 186 854   Basic Metabolic Panel: Recent Labs  Lab 04/10/20 0517 04/10/20 1444 04/11/20 0428 04/12/20 0623 04/13/20 0441 04/14/20 0530  NA 149* 147* 147* 142 141 145  K 2.4* 3.1* 3.3* 3.1* 4.1 4.3  CL 93* 94* 94* 84* 95* 99  CO2 40* 38* 39* 44* 35* 35*  GLUCOSE 195* 228* 220* 328* 206* 173*  BUN 39* 42* 40* 37* 30* 29*  CREATININE 0.56 0.61 0.62 0.55 0.55 0.47  CALCIUM 9.7 9.2 9.2 9.3 8.9 9.2  MG 2.4  --  2.2 2.2 2.0 2.3   GFR: Estimated Creatinine Clearance: 57.6 mL/min (by C-G formula based on SCr of 0.47 mg/dL). Liver Function Tests: Recent Labs  Lab 04/10/20 0517 04/10/20 1040 04/11/20 0428 04/12/20 0623 04/13/20 0441 04/14/20 0530  AST 15  --  19 25 18  35  ALT 15  --  16 29 27  41  ALKPHOS 82  --  74 84 77 118  BILITOT 1.8* 1.5* 2.1* 2.2* 1.6* 1.3*  PROT 7.5  --  7.0 6.6 6.3* 6.3*  ALBUMIN 4.9  --  4.4 4.1 3.8 3.8   Recent Labs  Lab 04/09/20 0438  LIPASE 27   Recent Labs  Lab 04/08/20 1002  AMMONIA 34   Coagulation Profile: Recent Labs  Lab 04/10/20 1040  INR 1.1   Cardiac Enzymes: No results for input(s): CKTOTAL, CKMB, CKMBINDEX, TROPONINI in the last 168 hours. BNP (last 3 results) No results for input(s): PROBNP in the last 8760 hours. HbA1C: No results for input(s): HGBA1C in the last 72 hours. CBG: Recent Labs  Lab 04/13/20 1241 04/13/20 1635 04/13/20 2043 04/14/20 0753 04/14/20 1126  GLUCAP 149* 127* 246* 191* 243*   Lipid Profile: No results for input(s): CHOL, HDL, LDLCALC, TRIG, CHOLHDL, LDLDIRECT in the last 72  hours. Thyroid Function Tests: No results for input(s): TSH, T4TOTAL, FREET4, T3FREE, THYROIDAB in the last 72 hours. Anemia Panel: No results for input(s): VITAMINB12, FOLATE, FERRITIN, TIBC, IRON, RETICCTPCT in the last 72 hours. Sepsis Labs: Recent Labs  Lab 04/08/20 1222 04/12/20 0621  PROCALCITON <0.10 <0.10    Recent Results (from the past 240 hour(s))  Urine Culture     Status: None   Collection Time: 04/08/20  9:52 AM   Specimen: Urine, Random  Result Value Ref Range Status   Specimen Description   Final    URINE, RANDOM Performed at [facility], [address], [place], [place] [address]    Special Requests   Final    NONE Performed at [facility], [address], [place], [place] [address]    Culture   Final    NO GROWTH Performed at [facility], [place] [address], [place], [place] [address]    Report Status 04/09/2020 FINAL  Final  CULTURE, BLOOD (ROUTINE X 2) w Reflex to ID Panel     Status: None   Collection Time: 04/08/20 10:02 AM   Specimen: BLOOD  Result Value Ref Range Status   Specimen Description BLOOD BLOOD RIGHT HAND  Final   Special Requests   Final    BOTTLES DRAWN AEROBIC AND ANAEROBIC Blood Culture results may not be optimal due to an inadequate volume of blood received in culture bottles   Culture   Final    NO GROWTH 5 DAYS Performed at [facility], [address] [place], [place] [address]    Report Status 04/13/2020 FINAL  Final  MRSA PCR Screening     Status: None   Collection Time: 04/08/20 10:04 AM   Specimen: Nasal Mucosa; Nasopharyngeal  Result Value Ref Range Status   MRSA by PCR NEGATIVE NEGATIVE Final    Comment:        The GeneXpert MRSA Assay (FDA approved for NASAL specimens only), is one component of a comprehensive MRSA colonization surveillance program. It is not intended to diagnose MRSA infection nor to guide or monitor treatment for MRSA infections. Performed at [facility] [address] [place], [place] [address]   CULTURE, BLOOD (ROUTINE X 2) w Reflex to ID Panel     Status: None   Collection Time: 04/08/20 10:26 AM   Specimen: BLOOD  Result Value Ref Range Status   Specimen Description BLOOD BLOOD RIGHT HAND  Final   Special Requests   Final    BOTTLES DRAWN AEROBIC AND ANAEROBIC Blood Culture results may not be optimal due to an inadequate volume of blood received in culture bottles   Culture   Final    NO GROWTH 5 DAYS Performed at [facility], [address] [place], [place] [address]    Report Status 04/13/2020 FINAL  Final  Urine Culture     Status: None   Collection Time: 04/10/20  6:21 PM   Specimen: Urine, Random  Result Value Ref Range Status   Specimen Description   Final    URINE, RANDOM Performed at [facility], [address] [place], [place] [address]    Special Requests   Final    NONE Performed at [facility], [address], [place], [place] [address]    Culture   Final    NO GROWTH Performed at [facility], [place] [address] [place], [place] [address]    Report Status 04/11/2020 FINAL  Final  MRSA PCR Screening     Status: None   Collection Time: 04/12/20  3:25 PM   Specimen: Nasopharyngeal  Result Value Ref Range Status   MRSA by PCR NEGATIVE NEGATIVE Final    Comment:        The GeneXpert MRSA Assay (FDA approved for NASAL specimens only), is one component of a comprehensive MRSA colonization surveillance program. It is not intended to diagnose MRSA infection nor to guide or monitor treatment for MRSA infections. Performed at [facility], [address], [place],  [address]       Imaging Studies   No results found.   Medications   Scheduled Meds: . amLODipine  10 mg Oral Daily  . vitamin C  1,000 mg Oral Daily  . chlorhexidine  15 mL Mouth Rinse BID  . Chlorhexidine Gluconate Cloth  6 each Topical Daily  . enoxaparin (LOVENOX) injection   40 mg Subcutaneous Q24H  . feeding supplement (GLUCERNA SHAKE)  237 mL Oral TID BM  . fluticasone  1 spray Each Nare BID  . insulin aspart  0-5 Units Subcutaneous QHS  . insulin aspart  0-9 Units Subcutaneous TID WC  . insulin glargine  20 Units Subcutaneous Daily  . ipratropium-albuterol  3 mL Nebulization TID  . levothyroxine  150 mcg Oral Q0600  . losartan  100 mg Oral Daily  . mouth rinse  15 mL Mouth Rinse q12n4p  . metoprolol tartrate  50 mg Oral BID  . multivitamin with minerals  1 tablet Oral Daily  . pantoprazole  40 mg Oral Daily  . predniSONE  40 mg Oral Q breakfast  . primidone  50 mg Oral BID  . rosuvastatin  5 mg Oral Daily  . sertraline  50 mg Oral Daily  . sodium chloride flush  3 mL Intravenous Q12H  . vitamin B-12  1,000 mcg Oral Daily   Continuous Infusions: . sodium chloride Stopped (04/14/20 0100)  . ampicillin-sulbactam (UNASYN) IV 1.5 g (04/14/20 1244)       LOS: 11 days    Time spent: 30 minutes with greater than 50% spent at bedside and in coordination of care    Ezekiel Slocumb, DO [facility]  04/14/2020, 2:37 PM    If 7PM-7AM, please contact night-coverage. How to contact the [facility] Attending or Consulting provider [place] or covering provider during after hours [place], for this patient?    1. Check the care team in [facility] and look for a) attending/consulting [facility] provider listed and b) the [facility] team listed 2. Log into [email or web address] and use 's universal password to access. If you do not have the password, please contact the hospital operator. 3. Locate the [facility] provider you are looking for under [facility] and page to a number that you can be directly reached. 4. If you still have difficulty reaching the provider, please page the [facility] (Director on Call) for the Hospitalists listed on amion for assistance.

## 2020-04-14 NOTE — Evaluation (Signed)
Occupational Therapy Re-Evaluation Patient Details Name: Martha [name] MRN: [id number] DOB: [birth year] Today's Date: 04/14/2020    History of Present Illness Martha [name] with medical history significant of  HTN, HLD, DM, COPD, tobacco abuse, hypothyroidism, gout, depression, tremor, proteinuria, depression, anxiety, GERD, diverticulitis, who presents with bilateral leg edema and pain, and SOB.  Pt with very painful b/l LE wounds (R>L). Pt with CT negative for PE, but showing bullous emphysema with overlying pulmonary edema. Pt with transfer to ICU d/t change in medical status and is now more stable and transferred back to progressive care unit at time of re-evaluation.   Clinical Impression   Pt seen for OT re-evaluation this date in setting of prolonged hospitalization with LE wounds and pulmonary edema. Pt presents grossly weak, but does demo increased activity tolerance with improvement of edema in LEs. OT facilitates pt particiaption in sup to sit MOD A with HOB elevated. Pt demos F Static sitting balance with use of UEs for support. OT trials 2 stands with pt with MAX A from elevated bed surface (~3 inches). Pt stands with hips still moderately flexed and only partially clears bottom from bed surface. Pt requires SETUP to MIN A for seated UB ADLs and MAX A for bed level or sitting LB ADLs. OT attempted to engage pt in therex while seated EOB at end of session, but pt demos drowsiness with increased difficulty following cues as session progressed. Pt requires MAX A to return to supine. Pt left with all needs met an in reach. Anticipate SNF is most appropriate d/c recommendation as pt was MOD I with fxl mobility and INDEP with most self care at her baseline (before weeks leading up to hospitalization)     Follow Up Recommendations  SNF    Equipment Recommendations  3 in 1 bedside commode;Tub/shower seat;Other (comment) (2WW)    Recommendations for Other Services       Precautions /  Restrictions Precautions Precautions: Fall Restrictions Weight Bearing Restrictions: No Other Position/Activity Restrictions: b/l LEs very swollen, painful, open wounds, weeping, wrapped in coban, elevated      Mobility Bed Mobility Overal bed mobility: Needs Assistance Bed Mobility: Supine to Sit;Sit to Supine     Supine to sit: Mod assist;HOB elevated Sit to supine: Max assist   General bed mobility comments: MAX A to manage LEs for back to bed    Transfers Overall transfer level: Needs assistance Equipment used: 1 person hand held assist Transfers: Sit to/from Stand Sit to Stand: Max assist;From elevated surface         General transfer comment: requires MAX A x3 trials to come to partial stand from elevated EOB surface with arm in arm technique. Pt is noted to still have hips flexed and only moderately clears bottom from bed surface.    Balance Overall balance assessment: Needs assistance Sitting-balance support: Bilateral upper extremity supported;Feet supported Sitting balance-Leahy Scale: Fair Sitting balance - Comments: F static sitting with UE support to sustain balance     Standing balance-Leahy Scale: Zero Standing balance comment: MAX A with external support as well as bilateral UEs supported.                           ADL either performed or assessed with clinical judgement   ADL Overall ADL's : Needs assistance/impaired Eating/Feeding: Set up;Bed level Eating/Feeding Details (indicate cue type and reason): fowler's position in bed (HOB ~50-60 degrees) Grooming: Wash/dry face;Supervision/safety;Set up;Cueing for sequencing  Grooming Details (indicate cue type and reason): pt drowsy, required cues to initiate and complete task Upper Body Bathing: Minimal assistance;Sitting Upper Body Bathing Details (indicate cue type and reason): based on clinical observation Lower Body Bathing: Moderate assistance;Maximal assistance;Sitting/lateral  leans Lower Body Bathing Details (indicate cue type and reason): based on clinical observation Upper Body Dressing : Minimal assistance;Sitting;Cueing for sequencing   Lower Body Dressing: Moderate assistance;Bed level Lower Body Dressing Details (indicate cue type and reason): long sitting/circle sitting in bed to don R sock with MIN/MOD A, requires MOD/MAX A for L sock   Toilet Transfer Details (indicate cue type and reason): Pt unable to complete pivot transfer from high to low surface from EOB to chair (and therefore not tested with [facility]) with OT at this time. Requires MAX A arm in arm from elevated surface x3 trials to come to partial stand, but is noted to have hips remain flexed only moderately clear bottom from bed surface. Toileting- Clothing Manipulation and Hygiene: Maximal assistance;Total assistance;Bed level               Vision Baseline Vision/History: Wears glasses Wears Glasses: At all times Patient Visual Report: No change from baseline       Perception     Praxis      Pertinent Vitals/Pain Pain Assessment: 0-10 Pain Score: 4  Pain Location: back side of calves of b/l LEs, pain increases with any/all movement Pain Descriptors / Indicators: Jabbing;Tightness Pain Intervention(s): Limited activity within patient's tolerance;Monitored during session;Repositioned     Hand Dominance Right   Extremity/Trunk Assessment Upper Extremity Assessment Upper Extremity Assessment: Generalized weakness   Lower Extremity Assessment Lower Extremity Assessment: Generalized weakness (improved edema from initial therapy evaluation. ROM limited d/t pain)       Communication Communication Communication: No difficulties   Cognition Arousal/Alertness: Lethargic Behavior During Therapy: WFL for tasks assessed/performed Overall Cognitive Status: Within Functional Limits for tasks assessed                                 General Comments: pt pretty drowsy  throughout session, but able to be awoken and attend to task, usually attends in ~1-2 minute boust before requiring tactile/auditory stimuli to attend. Pt oriented to being at the hospital and in [place]. Oriented to self and year, but not month or other aspects of time. Pt's daughter reports she also recieved nausea medicine that could be impacting her drowsiness, but endorses pt has been pretty confused throughout this hospitalization.   General Comments       Exercises Other Exercises Other Exercises: OT facilitates ed with pt and daughter who is present throughout re: importance of OOB Activity to reduce risk for further skin breakdown, muscle atrophy or respiratory infection. Pt with minimal understanding of education confirmed. Pt's daughter very attentive and supportive throughout and demos great carryover.   Shoulder Instructions      Home Living Family/patient expects to be discharged to:: Skilled nursing facility Living Arrangements: Spouse/significant other   Type of Home: House                       Home Equipment: Gilford Rile - 4 wheels;Bedside commode;Cane - single point;Shower seat   Additional Comments: has a walk in shower      Prior Functioning/Environment Level of Independence: Needs assistance  Gait / Transfers Assistance Needed: Pt has been less and less able to get around in  the home recently, wound care/una boots for the last few months ADL's / Homemaking Assistance Needed: pt confused at time of re-evaluation. Pt's previous therapy notes and pt's daughter confirms: reports her boyfriend has to assist with bathing, dressing   Comments: Pt's daughter present in room at time of re-evaluation. She reports that pt was completely indep until ~3 weeks before admission and reports that pt's spouse was performing almost all aspects of care and helping mobilize.        OT Problem List: Decreased strength;Decreased cognition;Pain;Increased edema;Decreased activity  tolerance;Impaired balance (sitting and/or standing);Decreased knowledge of use of DME or AE;Obesity      OT Treatment/Interventions: Self-care/ADL training;Therapeutic exercise;Therapeutic activities;DME and/or AE instruction;Patient/family education;Balance training;Energy conservation    OT Goals(Current goals can be found in the care plan section) Acute Rehab OT Goals Patient Stated Goal: to get stronger and better able to move OT Goal Formulation: With patient/family Time For Goal Achievement: 04/28/20 Potential to Achieve Goals: Good ADL Goals Pt Will Perform Lower Body Dressing: with mod assist;sitting/lateral leans (with AE PRN) Additional ADL Goal #1: Pt will demo increased tolerance for seated ADL participation, for at least 20 minutes to complete bathing, dressing and/or grooming task Additional ADL Goal #2: Pt will complete UE and core HEP with MIN verbal/visual cues for form and face.  OT Frequency: Min 1X/week   Barriers to D/C:            Co-evaluation              AM-PAC OT "6 Clicks" Daily Activity     Outcome Measure Help from another person eating meals?: None Help from another person taking care of personal grooming?: A Little Help from another person toileting, which includes using toliet, bedpan, or urinal?: A Lot Help from another person bathing (including washing, rinsing, drying)?: A Lot Help from another person to put on and taking off regular upper body clothing?: A Little Help from another person to put on and taking off regular lower body clothing?: A Lot 6 Click Score: 16   End of Session Equipment Utilized During Treatment: Gait belt  Activity Tolerance: Patient tolerated treatment well;Other (comment) (mild confusion and drowsiness limit tolerance/participation) Patient left:    OT Visit Diagnosis: Other abnormalities of gait and mobility (R26.89);Muscle weakness (generalized) (M62.81);Other symptoms and signs involving cognitive function                 Time: 5953-9672 OT Time Calculation (min): 42 min Charges:  OT General Charges $OT Visit: 1 Visit OT Evaluation $OT Re-eval: 1 Re-eval OT Treatments $Self Care/Home Management : 23-37 mins $Therapeutic Activity: 8-22 mins  Gerrianne Scale, MS, OTR/L ascom [phone] 04/14/20, 2:30 PM

## 2020-04-14 NOTE — Plan of Care (Signed)
  Problem: Health Behavior/Discharge Planning: °Goal: Ability to manage health-related needs will improve °Outcome: Progressing °  °Problem: Clinical Measurements: °Goal: Respiratory complications will improve °Outcome: Progressing °  °Problem: Safety: °Goal: Ability to remain free from injury will improve °Outcome: Progressing °  °

## 2020-04-14 NOTE — Progress Notes (Addendum)
Daughter at bedside Date of Admission:  04/03/2020   *   ID: Martha [name] is a 74 y.o. female  Principal Problem:   Acute CHF (congestive heart failure) ([place]) Active Problems:   COPD exacerbation ([place])   Diabetes mellitus without complication ([place])   Current tobacco use   Hyperlipidemia   Essential hypertension   Hypothyroidism   Gout   Depression   Tremors of nervous system   Acute respiratory failure with hypoxia ([facility])   Cellulitis of lower extremity   Acute pulmonary edema ([facility])   Elevated brain natriuretic peptide (BNP) level   Nephrotic syndrome    Subjective: Doing better No diarrhea Cough better Breathing better Says she has heart burn Medications:  . amLODipine  10 mg Oral Daily  . vitamin C  1,000 mg Oral Daily  . chlorhexidine  15 mL Mouth Rinse BID  . Chlorhexidine Gluconate Cloth  6 each Topical Daily  . enoxaparin (LOVENOX) injection  40 mg Subcutaneous Q24H  . feeding supplement (GLUCERNA SHAKE)  237 mL Oral TID BM  . fluticasone  1 spray Each Nare BID  . insulin aspart  0-5 Units Subcutaneous QHS  . insulin aspart  0-9 Units Subcutaneous TID WC  . insulin glargine  20 Units Subcutaneous Daily  . ipratropium-albuterol  3 mL Nebulization TID  . levothyroxine  150 mcg Oral Q0600  . losartan  100 mg Oral Daily  . mouth rinse  15 mL Mouth Rinse q12n4p  . metoprolol tartrate  50 mg Oral BID  . multivitamin with minerals  1 tablet Oral Daily  . pantoprazole  40 mg Oral Daily  . predniSONE  40 mg Oral Q breakfast  . primidone  50 mg Oral BID  . rosuvastatin  5 mg Oral Daily  . sertraline  50 mg Oral Daily  . sodium chloride flush  3 mL Intravenous Q12H  . vitamin B-12  1,000 mcg Oral Daily    Objective: Vital signs in last 24 hours: Temp:  [97.6 F (36.4 C)-98.8 F (37.1 C)] 97.9 F (36.6 C) (12/16 1520) Pulse Rate:  [60-78] 69 (12/16 1520) Resp:  [16-18] 17 (12/16 1520) BP: (158-188)/(60-69) 168/67 (12/16 1520) SpO2:  [83 %-97 %] 92 %  (12/16 1520) Weight:  [67.9 kg] 67.9 kg (12/16 0413)  PHYSICAL EXAM:  General: Alert, cooperative, no distress,  Head: Normocephalic, without obvious abnormality, atraumatic. Eyes: Conjunctivae clear, anicteric sclerae. Pupils are equal ENT Nares normal. No drainage or sinus tenderness. Lips, mucosa, and tongue normal. No Thrush edentulous Neck: Supple,  Back: No CVA tenderness. Lungs: b/l air entry Few rhochi Heart: s1s2 Abdomen: Soft, non-tender,not distended. Bowel sounds normal. No masses Extremities: bot legs covered in UNNA boot Skin: No rashes or lesions. Or bruising Lymph: Cervical, supraclavicular normal. Neurologic: Grossly non-focal  Lab Results Recent Labs    04/13/20 0441 04/14/20 0530  WBC 16.6* 14.1*  HGB 14.7 14.9  HCT 48.3* 47.3*  NA 141 145  K 4.1 4.3  CL 95* 99  CO2 35* 35*  BUN 30* 29*  CREATININE 0.55 0.47   Liver Panel Recent Labs    04/13/20 0441 04/14/20 0530  PROT 6.3* 6.3*  ALBUMIN 3.8 3.8  AST 18 35  ALT 27 41  ALKPHOS 77 118  BILITOT 1.6* 1.3*   Sedimentation Rate No results for input(s): ESRSEDRATE in the last 72 hours. C-Reactive Protein No results for input(s): CRP in the last 72 hours.  Microbiology:  Studies/Results: No results found.   Assessment/Plan: Acute  hypoxic respiratory failure due to combination of COPD exacerbation and CHF.  Much improved Acute metabolic encephalopathy.  Combination of hypercarbia and medications.  Much improved Bilateral pulmonary infiltrates.  Has been on antibiotics day 12.  Bacterial pneumonia is less likely as the procalcitonin is less than 0.10.  Possibility of mucous plugs, atelectasis and CHF.  Currently on Unasyn.  Can DC tomorrow. Leukocytosis is improved  Urinary retention causing abdominal pain.  Resolved after Foley catheter placement.  Proteinuria with nephrotic range.  Normal creatinine. Bilateral edema legs secondary to proteinuria and CHF Blisters secondary to edema.   Much improved on a Unna boot.  Followed by wound care.  ID will sign off.  Call if needed.

## 2020-04-14 NOTE — Plan of Care (Signed)

## 2020-04-14 NOTE — Progress Notes (Signed)
[facility]  ROUNDING NOTE   Subjective:   Patient appears pleasant and comfortable in bed today. She reports shortness of breath with exertion, requiring 8 L of supplemental O2 via high flow nasal cannula. She also reports nausea this morning. Clear yellow urine is draining via Foley catheter.  Objective:  Vital signs in last 24 hours:  Temp:  [97.6 F (36.4 C)-99 F (37.2 C)] 98 F (36.7 C) (12/16 1129) Pulse Rate:  [60-78] 68 (12/16 1129) Resp:  [16-20] 16 (12/16 1129) BP: (149-188)/(53-69) 176/69 (12/16 1129) SpO2:  [91 %-97 %] 97 % (12/16 1129) Weight:  [67.9 kg] 67.9 kg (12/16 0413)  Weight change: -0.59 kg Filed Weights   04/09/20 1034 04/13/20 0448 04/14/20 0413  Weight: 66.9 kg 68.4 kg 67.9 kg    Intake/Output: I/O last 3 completed shifts: In: 40 [P.O.:120; I.V.:54.3; IV Piggyback:403.7] Out: 1975 [ASTMH:9622]   Intake/Output this shift:  Total I/O In: 740 [P.O.:740] Out: 850 [Urine:850]  Physical Exam: General:  In no acute distress  Head: Oral mucous membranes dry  Eyes:  Anicteric  Lungs:   Respirations symmetrical, accessory muscle use +,  on 8L O2 via HFNC, rhonchi +,cough+  Heart:  Regular rate and rhythm  Abdomen:   Soft, nontender, nondistended  Extremities:  No peripheral edema  Neurologic:  Speech clear and appropriate  Skin:  Areas of ecchymoses on bilateral arms,resolving, bilateral lower legs wrapped with Coban    Basic Metabolic Panel: Recent Labs  Lab 04/10/20 0517 04/10/20 1444 04/11/20 0428 04/12/20 0623 04/13/20 0441 04/14/20 0530  NA 149* 147* 147* 142 141 145  K 2.4* 3.1* 3.3* 3.1* 4.1 4.3  CL 93* 94* 94* 84* 95* 99  CO2 40* 38* 39* 44* 35* 35*  GLUCOSE 195* 228* 220* 328* 206* 173*  BUN 39* 42* 40* 37* 30* 29*  CREATININE 0.56 0.61 0.62 0.55 0.55 0.47  CALCIUM 9.7 9.2 9.2 9.3 8.9 9.2  MG 2.4  --  2.2 2.2 2.0 2.3    Liver Function Tests: Recent Labs  Lab 04/10/20 0517 04/10/20 1040 04/11/20 0428  04/12/20 0623 04/13/20 0441 04/14/20 0530  AST 15  --  19 25 18  35  ALT 15  --  16 29 27  41  ALKPHOS 82  --  74 84 77 118  BILITOT 1.8* 1.5* 2.1* 2.2* 1.6* 1.3*  PROT 7.5  --  7.0 6.6 6.3* 6.3*  ALBUMIN 4.9  --  4.4 4.1 3.8 3.8   Recent Labs  Lab 04/09/20 0438  LIPASE 27   Recent Labs  Lab 04/08/20 1002  AMMONIA 34    CBC: Recent Labs  Lab 04/10/20 0517 04/11/20 0428 04/12/20 0623 04/13/20 0441 04/14/20 0530  WBC 11.0* 13.9* 16.2* 16.6* 14.1*  HGB 15.9* 15.3* 15.7* 14.7 14.9  HCT 51.4* 51.1* 51.5* 48.3* 47.3*  MCV 93.3 94.1 91.8 92.0 90.6  PLT 173 191 179 186 188    Cardiac Enzymes: No results for input(s): CKTOTAL, CKMB, CKMBINDEX, TROPONINI in the last 168 hours.  BNP: Invalid input(s): POCBNP  CBG: Recent Labs  Lab 04/13/20 1241 04/13/20 1635 04/13/20 2043 04/14/20 0753 04/14/20 1126  GLUCAP 149* 127* 246* 191* 243*    Microbiology: Results for orders placed or performed during the hospital encounter of 04/03/20  Resp Panel by RT-PCR (Flu A&B, Covid) Nasopharyngeal Swab     Status: None   Collection Time: 04/03/20 11:41 AM   Specimen: Nasopharyngeal Swab; Nasopharyngeal(NP) swabs in vial transport medium  Result Value Ref Range Status  SARS Coronavirus 2 by RT PCR NEGATIVE NEGATIVE Final    Comment: (NOTE) SARS-CoV-2 target nucleic acids are NOT DETECTED.  The SARS-CoV-2 RNA is generally detectable in upper respiratory specimens during the acute phase of infection. The lowest concentration of SARS-CoV-2 viral copies this assay can detect is 138 copies/mL. A negative result does not preclude SARS-Cov-2 infection and should not be used as the sole basis for treatment or other patient management decisions. A negative result may occur with  improper specimen collection/handling, submission of specimen other than nasopharyngeal swab, presence of viral mutation(s) within the areas targeted by this assay, and inadequate number of viral copies(<138  copies/mL). A negative result must be combined with clinical observations, patient history, and epidemiological information. The expected result is Negative.  Fact Sheet for Patients:  EntrepreneurPulse.com.au  Fact Sheet for Healthcare Providers:  IncredibleEmployment.be  This test is no t yet approved or cleared by the [place] FDA and  has been authorized for detection and/or diagnosis of SARS-CoV-2 by FDA under an Emergency Use Authorization (EUA). This EUA will remain  in effect (meaning this test can be used) for the duration of the COVID-19 declaration under Section 564(b)(1) of the Act, 21 U.S.C.section 360bbb-3(b)(1), unless the authorization is terminated  or revoked sooner.       Influenza A by PCR NEGATIVE NEGATIVE Final   Influenza B by PCR NEGATIVE NEGATIVE Final    Comment: (NOTE) The Xpert Xpress SARS-CoV-2/FLU/RSV plus assay is intended as an aid in the diagnosis of influenza from Nasopharyngeal swab specimens and should not be used as a sole basis for treatment. Nasal washings and aspirates are unacceptable for Xpert Xpress SARS-CoV-2/FLU/RSV testing.  Fact Sheet for Patients: EntrepreneurPulse.com.au  Fact Sheet for Healthcare Providers: IncredibleEmployment.be  This test is not yet approved or cleared by the [place] FDA and has been authorized for detection and/or diagnosis of SARS-CoV-2 by FDA under an Emergency Use Authorization (EUA). This EUA will remain in effect (meaning this test can be used) for the duration of the COVID-19 declaration under Section 564(b)(1) of the Act, 21 U.S.C. section 360bbb-3(b)(1), unless the authorization is terminated or revoked.  Performed at [facility], [address] [place], [place] [address]   Culture, blood (Routine X 2) w Reflex to ID Panel     Status: None   Collection Time: 04/03/20  3:27 PM   Specimen: BLOOD   Result Value Ref Range Status   Specimen Description BLOOD BLOOD RIGHT ARM  Final   Special Requests   Final    BOTTLES DRAWN AEROBIC AND ANAEROBIC Blood Culture adequate volume   Culture   Final    NO GROWTH 5 DAYS Performed at [facility], [address] [place], [place] [address]    Report Status 04/08/2020 FINAL  Final  Urine Culture     Status: Abnormal   Collection Time: 04/03/20  4:00 PM   Specimen: Urine, Random  Result Value Ref Range Status   Specimen Description   Final    URINE, RANDOM Performed at [facility], [address], [place], [place] [address]    Special Requests   Final    NONE Performed at [facility], [address] [place], [place] [address]    Culture (A)  Final    >=100,000 COLONIES/mL ESCHERICHIA COLI >=100,000 COLONIES/mL AEROCOCCUS URINAE Standardized susceptibility testing for this organism is not available. Performed at [facility], [place] [address] [place], [place] [address]  Report Status 04/07/2020 FINAL  Final   Organism ID, Bacteria ESCHERICHIA COLI (A)  Final      Susceptibility   Escherichia coli - MIC*    AMPICILLIN 4 SENSITIVE Sensitive     CEFAZOLIN <=4 SENSITIVE Sensitive     CEFEPIME <=0.12 SENSITIVE Sensitive     CEFTRIAXONE <=0.25 SENSITIVE Sensitive     CIPROFLOXACIN <=0.25 SENSITIVE Sensitive     GENTAMICIN <=1 SENSITIVE Sensitive     IMIPENEM <=0.25 SENSITIVE Sensitive     NITROFURANTOIN 32 SENSITIVE Sensitive     TRIMETH/SULFA <=20 SENSITIVE Sensitive     AMPICILLIN/SULBACTAM <=2 SENSITIVE Sensitive     PIP/TAZO <=4 SENSITIVE Sensitive     * >=100,000 COLONIES/mL ESCHERICHIA COLI  Culture, blood (Routine X 2) w Reflex to ID Panel     Status: None   Collection Time: 04/03/20 10:36 PM   Specimen: BLOOD  Result Value Ref Range Status   Specimen Description BLOOD BLOOD LEFT HAND  Final   Special Requests   Final    BOTTLES DRAWN AEROBIC AND ANAEROBIC Blood Culture adequate  volume   Culture   Final    NO GROWTH 5 DAYS Performed at [facility] [place], [address] [place], [place] [address]    Report Status 04/08/2020 FINAL  Final  Urine Culture     Status: None   Collection Time: 04/08/20  9:52 AM   Specimen: Urine, Random  Result Value Ref Range Status   Specimen Description   Final    URINE, RANDOM Performed at [facility], [address] [place], [place] [address]    Special Requests   Final    NONE Performed at [facility], [address] [place], [place] [address]    Culture   Final    NO GROWTH Performed at [facility], [place] [address] [place], [place] [address]    Report Status 04/09/2020 FINAL  Final  CULTURE, BLOOD (ROUTINE X 2) w Reflex to ID Panel     Status: None   Collection Time: 04/08/20 10:02 AM   Specimen: BLOOD  Result Value Ref Range Status   Specimen Description BLOOD BLOOD RIGHT HAND  Final   Special Requests   Final    BOTTLES DRAWN AEROBIC AND ANAEROBIC Blood Culture results may not be optimal due to an inadequate volume of blood received in culture bottles   Culture   Final    NO GROWTH 5 DAYS Performed at [facility], [address] [place], [place] [address]    Report Status 04/13/2020 FINAL  Final  MRSA PCR Screening     Status: None   Collection Time: 04/08/20 10:04 AM   Specimen: Nasal Mucosa; Nasopharyngeal  Result Value Ref Range Status   MRSA by PCR NEGATIVE NEGATIVE Final    Comment:        The GeneXpert MRSA Assay (FDA approved for NASAL specimens only), is one component of a comprehensive MRSA colonization surveillance program. It is not intended to diagnose MRSA infection nor to guide or monitor treatment for MRSA infections. Performed at [facility], [address] [place], [place] [address]   CULTURE, BLOOD (ROUTINE X 2) w Reflex to ID Panel     Status: None   Collection Time: 04/08/20 10:26 AM   Specimen: BLOOD  Result Value Ref Range  Status   Specimen Description BLOOD BLOOD RIGHT HAND  Final   Special Requests   Final    BOTTLES DRAWN AEROBIC AND  ANAEROBIC Blood Culture results may not be optimal due to an inadequate volume of blood received in culture bottles   Culture   Final    NO GROWTH 5 DAYS Performed at [facility], [address], [place], [place] [address]    Report Status 04/13/2020 FINAL  Final  Urine Culture     Status: None   Collection Time: 04/10/20  6:21 PM   Specimen: Urine, Random  Result Value Ref Range Status   Specimen Description   Final    URINE, RANDOM Performed at [facility], [address] [place], [place] [address]    Special Requests   Final    NONE Performed at [facility], [address] [place], [place] [address]    Culture   Final    NO GROWTH Performed at [facility], [place] [address], [place], [place] [address]    Report Status 04/11/2020 FINAL  Final  MRSA PCR Screening     Status: None   Collection Time: 04/12/20  3:25 PM   Specimen: Nasopharyngeal  Result Value Ref Range Status   MRSA by PCR NEGATIVE NEGATIVE Final    Comment:        The GeneXpert MRSA Assay (FDA approved for NASAL specimens only), is one component of a comprehensive MRSA colonization surveillance program. It is not intended to diagnose MRSA infection nor to guide or monitor treatment for MRSA infections. Performed at [facility], [address] [place], [place] [address]     Coagulation Studies: No results for input(s): LABPROT, INR in the last 72 hours.  Urinalysis: No results for input(s): COLORURINE, LABSPEC, PHURINE, GLUCOSEU, HGBUR, BILIRUBINUR, KETONESUR, PROTEINUR, UROBILINOGEN, NITRITE, LEUKOCYTESUR in the last 72 hours.  Invalid input(s): APPERANCEUR    Imaging: No results found.   Medications:   . sodium chloride Stopped (04/14/20 0100)  . ampicillin-sulbactam (UNASYN) IV 1.5 g (04/14/20 1244)   . amLODipine  10 mg  Oral Daily  . vitamin C  1,000 mg Oral Daily  . chlorhexidine  15 mL Mouth Rinse BID  . Chlorhexidine Gluconate Cloth  6 each Topical Daily  . enoxaparin (LOVENOX) injection  40 mg Subcutaneous Q24H  . feeding supplement (GLUCERNA SHAKE)  237 mL Oral TID BM  . fluticasone  1 spray Each Nare BID  . insulin aspart  0-5 Units Subcutaneous QHS  . insulin aspart  0-9 Units Subcutaneous TID WC  . insulin glargine  20 Units Subcutaneous Daily  . ipratropium-albuterol  3 mL Nebulization TID  . levothyroxine  150 mcg Oral Q0600  . losartan  100 mg Oral Daily  . mouth rinse  15 mL Mouth Rinse q12n4p  . metoprolol tartrate  50 mg Oral BID  . multivitamin with minerals  1 tablet Oral Daily  . pantoprazole  40 mg Oral Daily  . predniSONE  40 mg Oral Q breakfast  . primidone  50 mg Oral BID  . rosuvastatin  5 mg Oral Daily  . sertraline  50 mg Oral Daily  . sodium chloride flush  3 mL Intravenous Q12H  . vitamin B-12  1,000 mcg Oral Daily   sodium chloride, acetaminophen, acetaminophen, albuterol, calcium carbonate, dextromethorphan-guaiFENesin, fluticasone, labetalol, nitroGLYCERIN, ondansetron (ZOFRAN) IV, sodium chloride flush  Assessment/ Plan:  74 y.o. female  Martha [name] is a 74 y.o. 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  #  Proteinuria, 17 g.   # Nephrotic syndrome with anasarca.    Planning for renal biopsy when patient is able to tolerate lying flat Respiratory status improving But for the preceding 24 hours is 1075 ml  #Hypokalemia Serum potassium stays stable, 4.3 today   #Metabolic alkalosis with compensated respiratory acidosis and with metabolic acidosis Resolved  #Acute hypoxic respiratory failure  With right lower lobe pneumonia.  Reports cough and mild shortness of breath Continues to be on antibiotic Still  requiring 8 L of O2 via nasal cannula Respiratory status improving gradually  #Hypertension Blood pressure readings slightly above the goal Patient is on amlodipine, losartan and metoprolol  #Diabetes mellitus type 2 Lab Results  Component Value Date   HGBA1C 6.5 (H) 04/03/2020  Blood glucose this morning was 173 Continue current antihyperglycemic regimen   LOS: 11 Joey Lierman 12/16/20211:32 PM

## 2020-04-15 ENCOUNTER — Inpatient Hospital Stay: Payer: Medicare Other

## 2020-04-15 LAB — COMPREHENSIVE METABOLIC PANEL
ALT: 44 U/L (ref 0–44)
AST: 27 U/L (ref 15–41)
Albumin: 3.5 g/dL (ref 3.5–5.0)
Alkaline Phosphatase: 150 U/L — ABNORMAL HIGH (ref 38–126)
Anion gap: 10 (ref 5–15)
BUN: 26 mg/dL — ABNORMAL HIGH (ref 8–23)
CO2: 31 mmol/L (ref 22–32)
Calcium: 9.2 mg/dL (ref 8.9–10.3)
Chloride: 101 mmol/L (ref 98–111)
Creatinine, Ser: 0.5 mg/dL (ref 0.44–1.00)
GFR, Estimated: >60 mL/min (ref >60)
Glucose, Bld: 98 mg/dL (ref 70–99)
Potassium: 4.4 mmol/L (ref 3.5–5.1)
Sodium: 142 mmol/L (ref 135–145)
Total Bilirubin: 1.2 mg/dL (ref 0.3–1.2)
Total Protein: 6 g/dL — ABNORMAL LOW (ref 6.5–8.1)

## 2020-04-15 LAB — CBC
HCT: 45.6 % (ref 36.0–46.0)
Hemoglobin: 14.3 g/dL (ref 12.0–15.0)
MCH: 28.5 pg (ref 26.0–34.0)
MCHC: 31.4 g/dL (ref 30.0–36.0)
MCV: 91 fL (ref 80.0–100.0)
Platelets: 180 10*3/uL (ref 150–400)
RBC: 5.01 MIL/uL (ref 3.87–5.11)
RDW: 16.7 % — ABNORMAL HIGH (ref 11.5–15.5)
WBC: 14 10*3/uL — ABNORMAL HIGH (ref 4.0–10.5)
nRBC: 0 % (ref 0.0–0.2)

## 2020-04-15 LAB — GLUCOSE, CAPILLARY
Glucose-Capillary: 188 mg/dL — ABNORMAL HIGH (ref 70–99)
Glucose-Capillary: 266 mg/dL — ABNORMAL HIGH (ref 70–99)
Glucose-Capillary: 389 mg/dL — ABNORMAL HIGH (ref 70–99)
Glucose-Capillary: 241 mg/dL — ABNORMAL HIGH (ref 70–99)

## 2020-04-15 LAB — MAGNESIUM: Magnesium: 2.2 mg/dL (ref 1.7–2.4)

## 2020-04-15 LAB — BRAIN NATRIURETIC PEPTIDE: B Natriuretic Peptide: 1371.4 pg/mL — ABNORMAL HIGH (ref 0.0–100.0)

## 2020-04-15 MED ORDER — FUROSEMIDE 10 MG/ML IJ SOLN
40.0000 mg | Freq: Once | INTRAMUSCULAR | Status: AC
  Administered 2020-04-15: 17:00:00 40 mg via INTRAVENOUS
  Filled 2020-04-15: qty 4

## 2020-04-15 MED ORDER — PROCHLORPERAZINE EDISYLATE 10 MG/2ML IJ SOLN
10.0000 mg | Freq: Four times a day (QID) | INTRAMUSCULAR | Status: DC | PRN
  Administered 2020-04-15: 17:00:00 10 mg via INTRAVENOUS
  Filled 2020-04-15 (×2): qty 2

## 2020-04-15 NOTE — Progress Notes (Signed)
Found patient on room air. o2 saturation noted at 70%.  Family member states patient has taken it off several times in his presence. SVN given. Patient tolerated well. Placed patient back on HF cannula and spoke to her about the importance of wearing as she is awake during my visit

## 2020-04-15 NOTE — Progress Notes (Signed)
PROGRESS NOTE    Martha [name]   HQI:[id number]  DOB: May 16, 1945  PCP: Tracie Harrier, MD    DOA: 04/03/2020 LOS: 12   Brief Narrative   From H&P: "Martha [name] is a 74 y.o. female with medical history significant of  HTN, HLD, DM, COPD, tobacco abuse, hypothyroidism, gout, depression, tremor, proteinuria, depression, anxiety, GERD, diverticulitis, who presents with bilateral leg edema and pain, and SOB.   Patient states that she has chronic bilateral leg edema which has been progressively worsened recently. Both lower legs are painful and erythematous. She states that she gets her lower extremities wrapped once a week.  She has chronic cough and shortness of breath which has slightly worsened on exertion.  She coughs up some white mucus.  No fever or chills.  No chest pain.  On presentation she was hypoxic requiring BiPAP, later transitioned to 4 L of oxygen. Patient also has an history of proteinuria which is being worked up by nephrology as an outpatient and they are planning for biopsy.   Patient had pertinent labs positive for BNP of 1113, troponin and COVID-19 negative.  Chest x-ray with borderline cardiomegaly and mild pulmonary edema.  Significant edema of bilateral lower extremities with signs of venous congestion, skin peeling and few shallow ulcers.  Cardiology was consulted."  Pt required Bipap again and close monitoring in step down unit on 12/11.  Was resumed on antibiotics for suspected aspiration pneumonia and steroids.     Assessment & Plan   Principal Problem:   Acute CHF (congestive heart failure) (HCC) Active Problems:   COPD exacerbation (HCC)   Diabetes mellitus without complication ([place])   Current tobacco use   Hyperlipidemia   Essential hypertension   Hypothyroidism   Gout   Depression   Tremors of nervous system   Acute respiratory failure with hypoxia (HCC)   Cellulitis of lower extremity   Acute pulmonary edema (HCC)   Elevated brain  natriuretic peptide (BNP) level   Nephrotic syndrome  Acute respiratory failure with hypoxia and hypercapnia secondary to combination of acute on chronic diastolic CHF, COPD exacerbation, right lower lobe pneumonia?  Aspiration -was aggressively diuresed earlier this admission.  CT chest on 12/8 obtained for hemoptysis, showed atelectasis versus multifocal pneumonia.  Covid was negative on admission.  CTA later negative for PE.  Unlikely ACS as troponins very mildly elevated and flat. 12/16-17 -weaned to 7 L/min.   --BNP over 1300 this morning, chest x-ray " suspect combination of interstitial pulmonary edema and underlying fibrosis.  No consolidation.  Small pleural effusions bilaterally.  Cardiomegaly with pulmonary vascular congestion.  Suspect underlying CHF".  Has been off diuresis for at least a few days. --IV Lasix 40 mg once, monitor --Supplemental O2 as needed, maintain O2 sat greater than 88%, wean as tolerated --ID and pulmonology following --D/C Unasyn per ID --IV steroids were transitioned to prednisone 40 mg daily on 2/15 (steroids per pulm) --Follow blood cultures, negative to date --Qualification for home BiPAP is underway  Dysphagia -12/14 nursing reported patient gagging and choking with medications.  Concern for aspiration pneumonia.  SLP consulted for swallow eval.  Currently on dysphagia 2 diet (edentulous), tolerating.  Acute metabolic encephalopathy -improved after BiPAP.  Patient is awake, lucid and appropriately responding to questions.  Likely multifactorial with COPD, pneumonia, uremia urinary retention contributing. 12/16 - per daughter, pt improved but not near her baseline yet --Continue management of acute issues as outlined --[place] precautions:     -Lights and TV  off, minimize interruptions at night    -Blinds open and lights on during day    -Glasses/hearing aid with patient    -Frequent reorientation    -PT/OT when able    -Avoid sedation  medications/[place] list medications --Monitor  Abdominal pain /diarrhea - diarrhea has resolved.  No need to check C. difficile, can DC precautions. --Monitor abdominal pain, diet resumed per SLP dysphagia 2  Hyperbilirubinemia -elevated indirect bili, normal LFTs.  Peripheral smear, LDH, CBC without evidence of hemolysis.  RUQ ultrasound unrevealing.  Suspect due to acute illnesses.  Urinary retention -Foley was placed  Hemoptysis -resolved.  Patient had small volume blood-tinged sputum.  Suspect secondary to coughing episodes.  CTA chest showed atelectasis versus infiltrates and was negative for PE.  Nephrotic syndrome with proteinuria - Plan for kidney biopsy once recovered from acute illness.  Previously on Lasix infusion this admission. --Nephrology following --Currently off diuretic  Bilateral lower extremity venous stasis ulcers -with bilateral Unna boots in place.  Wound care consulted.  Was treated with 5 days Rocephin/Keflex.  Continue compression therapy  Bacteriuria -patient without UTI symptoms (was unable to provide history at the time), UA was positive for infection and culture grew pansensitive E. coli.  She completed 5 days of Rocephin and Keflex.  Hypokalemia -replaced.  Monitor BMP and replace as needed  Type 2 diabetes -A1c 6.5%.  Takes Metformin and glipizide at home.  Continue sliding scale NovoLog and basal insulin.  Tobacco abuse -patient is ongoing heavy smoker.  Nicotine patch ordered.  Essential hypertension -patient's losartan was increased to 100 mg.  She was started on amlodipine.  Cardiology also added metoprolol.  Labetalol IV as needed.  Hypothyroidism -continue Synthroid  Hyperlipidemia -continue Crestor  History of gout -no acute issues  History of depression -takes Zoloft which was held due to mental status changes.  Resume Zoloft tomorrow, 12/16.  History of tremors -Home primidone has been held due to mental status changes.  Resume primidone  this evening, 12/15.  Chronic pain - home oxycodone is held, was weaned off gabapentin earlier this admission due to encephalopathy.   Patient BMI: Body mass index is 25.81 kg/m.   DVT prophylaxis: enoxaparin (LOVENOX) injection 40 mg Start: 04/14/20 2200   Diet:  Diet Orders (From admission, onward)    Start     Ordered   04/13/20 1205  DIET DYS 2 Room service appropriate? Yes with Assist; Fluid consistency: Thin  Diet effective now       Comments: Please add extra Gravy on meats, potatoes. Yogurt at breakfast/lunch meals. Puddings, jello.  Question Answer Comment  Room service appropriate? Yes with Assist   Fluid consistency: Thin      04/13/20 1205            Code Status: Full Code    Subjective 04/15/20    Patient seen this morning, awake with breakfast tray in front of her.  None of breakfast tray touched, says hungry, needs help w feeding.  Pt quite lethargic this morning, not staying awake, drifts to sleep between answering questions.   Disposition Plan & Communication   Status is: Inpatient  Remains inpatient appropriate because:IV treatments appropriate due to intensity of illness or inability to take PO   Dispo: The patient is from: Home              Anticipated d/c is to: SNF              Anticipated d/c date is: 3 days  Patient currently is not medically stable to d/c.   Family Communication: Daughter was at bedside on rounds 12/16   Consults, Procedures, Significant Events   Consultants:   Nephrology  Cardiology  Infectious disease  Pulmonology  Procedures:   None  Antimicrobials:  Anti-infectives (From admission, onward)   Start     Dose/Rate Route Frequency Ordered Stop   04/13/20 0330  vancomycin (VANCOREADY) IVPB 500 mg/100 mL  Status:  Discontinued        500 mg 100 mL/hr over 60 Minutes Intravenous Every 12 hours 04/12/20 1458 04/12/20 1715   04/12/20 1815  ampicillin-sulbactam (UNASYN) 1.5 g in sodium chloride 0.9  % 100 mL IVPB        1.5 g 200 mL/hr over 30 Minutes Intravenous Every 6 hours 04/12/20 1715 04/15/20 2359   04/12/20 1515  vancomycin (VANCOREADY) IVPB 1500 mg/300 mL  Status:  Discontinued        1,500 mg 150 mL/hr over 120 Minutes Intravenous  Once 04/12/20 1428 04/12/20 1715   04/08/20 2200  vancomycin (VANCOREADY) IVPB 750 mg/150 mL  Status:  Discontinued        750 mg 150 mL/hr over 60 Minutes Intravenous Every 12 hours 04/08/20 1005 04/08/20 1318   04/08/20 1100  vancomycin (VANCOREADY) IVPB 1750 mg/350 mL  Status:  Discontinued        1,750 mg 175 mL/hr over 120 Minutes Intravenous  Once 04/08/20 1005 04/08/20 1318   04/08/20 1100  azithromycin (ZITHROMAX) tablet 500 mg        500 mg Oral Daily 04/08/20 1005 04/11/20 0959   04/08/20 1015  piperacillin-tazobactam (ZOSYN) IVPB 3.375 g  Status:  Discontinued        3.375 g 12.5 mL/hr over 240 Minutes Intravenous Every 8 hours 04/08/20 1005 04/12/20 1715   04/07/20 0600  cephALEXin (KEFLEX) capsule 500 mg        500 mg Oral Every 8 hours 04/06/20 1146 04/07/20 2121   04/05/20 1000  cefTRIAXone (ROCEPHIN) 2 g in sodium chloride 0.9 % 100 mL IVPB  Status:  Discontinued        2 g 200 mL/hr over 30 Minutes Intravenous Every 24 hours 04/04/20 1525 04/06/20 1146   04/04/20 1100  cefTRIAXone (ROCEPHIN) 2 g in sodium chloride 0.9 % 100 mL IVPB  Status:  Discontinued        2 g 200 mL/hr over 30 Minutes Intravenous Every 24 hours 04/04/20 1023 04/04/20 1510   04/03/20 1500  cefTRIAXone (ROCEPHIN) 1 g in sodium chloride 0.9 % 100 mL IVPB  Status:  Discontinued        1 g 200 mL/hr over 30 Minutes Intravenous Every 24 hours 04/03/20 1445 04/04/20 1023         Objective   Vitals:   04/15/20 0439 04/15/20 0727 04/15/20 1128 04/15/20 1356  BP:  (!) 153/68 (!) 171/68 139/63  Pulse:  75 60 64  Resp:  16 16   Temp:  97.9 F (36.6 C) 98.2 F (36.8 C)   TempSrc:  Oral Oral   SpO2:  98% 94%   Weight: 68.2 kg     Height:         Intake/Output Summary (Last 24 hours) at 04/15/2020 1515 Last data filed at 04/15/2020 1351 Gross per 24 hour  Intake 1080 ml  Output 1875 ml  Net -795 ml   Filed Weights   04/14/20 0413 04/15/20 0332 04/15/20 0439  Weight: 67.9 kg 68.7 kg 68.2 kg  Physical Exam:  General exam: lethargic, no acute distress Respiratory system: CTAB with diminished bases, normal respiratory effort, on 7 L/min HFNC. Cardiovascular system: normal S1/S2, RRR, no pedal edema.   Central nervous system: lethargic, oriented to person, place and almost time (said year 2022), no gross focal neurologic deficits, intention tremor noted Extremities: Unna boots on bilateral lower extremities, distal sensation intact, no cyanosis   Labs   Data Reviewed: I have personally reviewed following labs and imaging studies  CBC: Recent Labs  Lab 04/11/20 0428 04/12/20 0623 04/13/20 0441 04/14/20 0530 04/15/20 0417  WBC 13.9* 16.2* 16.6* 14.1* 14.0*  HGB 15.3* 15.7* 14.7 14.9 14.3  HCT 51.1* 51.5* 48.3* 47.3* 45.6  MCV 94.1 91.8 92.0 90.6 91.0  PLT 191 179 186 188 701   Basic Metabolic Panel: Recent Labs  Lab 04/11/20 0428 04/12/20 0623 04/13/20 0441 04/14/20 0530 04/15/20 0417  NA 147* 142 141 145 142  K 3.3* 3.1* 4.1 4.3 4.4  CL 94* 84* 95* 99 101  CO2 39* 44* 35* 35* 31  GLUCOSE 220* 328* 206* 173* 98  BUN 40* 37* 30* 29* 26*  CREATININE 0.62 0.55 0.55 0.47 0.50  CALCIUM 9.2 9.3 8.9 9.2 9.2  MG 2.2 2.2 2.0 2.3 2.2   GFR: Estimated Creatinine Clearance: 57.6 mL/min (by C-G formula based on SCr of 0.5 mg/dL). Liver Function Tests: Recent Labs  Lab 04/11/20 0428 04/12/20 0623 04/13/20 0441 04/14/20 0530 04/15/20 0417  AST 19 25 18  35 27  ALT 16 29 27  41 44  ALKPHOS 74 84 77 118 150*  BILITOT 2.1* 2.2* 1.6* 1.3* 1.2  PROT 7.0 6.6 6.3* 6.3* 6.0*  ALBUMIN 4.4 4.1 3.8 3.8 3.5   Recent Labs  Lab 04/09/20 0438  LIPASE 27   No results for input(s): AMMONIA in the last 168  hours. Coagulation Profile: Recent Labs  Lab 04/10/20 1040  INR 1.1   Cardiac Enzymes: No results for input(s): CKTOTAL, CKMB, CKMBINDEX, TROPONINI in the last 168 hours. BNP (last 3 results) No results for input(s): PROBNP in the last 8760 hours. HbA1C: No results for input(s): HGBA1C in the last 72 hours. CBG: Recent Labs  Lab 04/14/20 1609 04/14/20 1948 04/14/20 2133 04/15/20 0724 04/15/20 1129  GLUCAP 235* 338* 248* 188* 266*   Lipid Profile: No results for input(s): CHOL, HDL, LDLCALC, TRIG, CHOLHDL, LDLDIRECT in the last 72 hours. Thyroid Function Tests: No results for input(s): TSH, T4TOTAL, FREET4, T3FREE, THYROIDAB in the last 72 hours. Anemia Panel: No results for input(s): VITAMINB12, FOLATE, FERRITIN, TIBC, IRON, RETICCTPCT in the last 72 hours. Sepsis Labs: Recent Labs  Lab 04/12/20 7793  PROCALCITON <0.10    Recent Results (from the past 240 hour(s))  Urine Culture     Status: None   Collection Time: 04/08/20  9:52 AM   Specimen: Urine, Random  Result Value Ref Range Status   Specimen Description   Final    URINE, RANDOM Performed at [facility], [address], [place], [place] [address]    Special Requests   Final    NONE Performed at [facility], [address], [place], [place] [address]    Culture   Final    NO GROWTH Performed at [facility], [place] [address], [place], [place] [address]    Report Status 04/09/2020 FINAL  Final  CULTURE, BLOOD (ROUTINE X 2) w Reflex to ID Panel     Status: None   Collection Time: 04/08/20 10:02 AM  Specimen: BLOOD  Result Value Ref Range Status   Specimen Description BLOOD BLOOD RIGHT HAND  Final   Special Requests   Final    BOTTLES DRAWN AEROBIC AND ANAEROBIC Blood Culture results may not be optimal due to an inadequate volume of blood received in culture bottles   Culture   Final    NO GROWTH 5 DAYS Performed at [facility], [address] [place], [place]  [address]    Report Status 04/13/2020 FINAL  Final  MRSA PCR Screening     Status: None   Collection Time: 04/08/20 10:04 AM   Specimen: Nasal Mucosa; Nasopharyngeal  Result Value Ref Range Status   MRSA by PCR NEGATIVE NEGATIVE Final    Comment:        The GeneXpert MRSA Assay (FDA approved for NASAL specimens only), is one component of a comprehensive MRSA colonization surveillance program. It is not intended to diagnose MRSA infection nor to guide or monitor treatment for MRSA infections. Performed at [facility], [address] [place], [place] [address]   CULTURE, BLOOD (ROUTINE X 2) w Reflex to ID Panel     Status: None   Collection Time: 04/08/20 10:26 AM   Specimen: BLOOD  Result Value Ref Range Status   Specimen Description BLOOD BLOOD RIGHT HAND  Final   Special Requests   Final    BOTTLES DRAWN AEROBIC AND ANAEROBIC Blood Culture results may not be optimal due to an inadequate volume of blood received in culture bottles   Culture   Final    NO GROWTH 5 DAYS Performed at [facility], [address], [place], [place] [address]    Report Status 04/13/2020 FINAL  Final  Urine Culture     Status: None   Collection Time: 04/10/20  6:21 PM   Specimen: Urine, Random  Result Value Ref Range Status   Specimen Description   Final    URINE, RANDOM Performed at [facility], [address], [place], [place] [address]    Special Requests   Final    NONE Performed at [facility], [address] [place], [place] [address]    Culture   Final    NO GROWTH Performed at [facility], [place] [address] [place], [place] [address]    Report Status 04/11/2020 FINAL  Final  MRSA PCR Screening     Status: None   Collection Time: 04/12/20  3:25 PM   Specimen: Nasopharyngeal  Result Value Ref Range Status   MRSA by PCR NEGATIVE NEGATIVE Final    Comment:        The GeneXpert MRSA Assay (FDA approved for NASAL specimens only), is one  component of a comprehensive MRSA colonization surveillance program. It is not intended to diagnose MRSA infection nor to guide or monitor treatment for MRSA infections. Performed at [facility], [address], [place],  [address]       Imaging Studies   DG Chest [place] 1 View  Result Date: 04/15/2020 CLINICAL DATA:  Shortness of breath with soft tissue edema EXAM: PORTABLE CHEST 1 VIEW COMPARISON:  Chest radiograph April 09, 2020 and chest CT April 12, 2020 FINDINGS: There is diffuse interstitial prominence, likely representing a combination of fibrosis and interstitial pulmonary edema. There is atelectatic change in the left mid lung. There are small pleural effusions bilaterally. No consolidation. There is cardiomegaly with a degree of pulmonary venous hypertension. There is aortic atherosclerosis. No appreciable adenopathy. Foci  of calcification noted in each carotid artery. Calcification noted in the lateral left shoulder, stable. IMPRESSION: Suspect combination of interstitial pulmonary edema and underlying fibrosis. No consolidation. Small pleural effusions bilaterally. Cardiomegaly with pulmonary vascular congestion. Suspect a degree of underlying congestive heart failure. Aortic Atherosclerosis (ICD10-I70.0). Electronically Signed   By: Lowella Grip III M.D.   On: 04/15/2020 09:01     Medications   Scheduled Meds: . amLODipine  10 mg Oral Daily  . vitamin C  1,000 mg Oral Daily  . chlorhexidine  15 mL Mouth Rinse BID  . Chlorhexidine Gluconate Cloth  6 each Topical Daily  . enoxaparin (LOVENOX) injection  40 mg Subcutaneous Q24H  . feeding supplement (GLUCERNA SHAKE)  237 mL Oral TID BM  . fluticasone  1 spray Each Nare BID  . insulin aspart  0-5 Units Subcutaneous QHS  . insulin aspart  0-9 Units Subcutaneous TID WC  . insulin glargine  20 Units Subcutaneous Daily  . ipratropium-albuterol  3 mL Nebulization TID  . levothyroxine  150 mcg Oral  Q0600  . losartan  100 mg Oral Daily  . mouth rinse  15 mL Mouth Rinse q12n4p  . metoprolol tartrate  50 mg Oral BID  . multivitamin with minerals  1 tablet Oral Daily  . pantoprazole  40 mg Oral Daily  . predniSONE  40 mg Oral Q breakfast  . primidone  50 mg Oral BID  . rosuvastatin  5 mg Oral Daily  . sertraline  50 mg Oral Daily  . sodium chloride flush  3 mL Intravenous Q12H  . vitamin B-12  1,000 mcg Oral Daily   Continuous Infusions: . sodium chloride 250 mL (04/15/20 1256)  . ampicillin-sulbactam (UNASYN) IV 1.5 g (04/15/20 1258)       LOS: 12 days    Time spent: 25 minutes with greater than 50% spent at bedside and in coordination of care    Ezekiel Slocumb, DO [facility]  04/15/2020, 3:15 PM    If 7PM-7AM, please contact night-coverage. How to contact the [facility] Attending or Consulting provider [place] or covering provider during after hours [place], for this patient?    1. Check the care team in [facility] and look for a) attending/consulting [facility] provider listed and b) the [facility] team listed 2. Log into [email or web address] and use [facility]'s universal password to access. If you do not have the password, please contact the hospital operator. 3. Locate the [facility] provider you are looking for under [facility] and page to a number that you can be directly reached. 4. If you still have difficulty reaching the provider, please page the [facility] (Director on Call) for the Hospitalists listed on amion for assistance.

## 2020-04-15 NOTE — Progress Notes (Signed)
Pulmonary and Critical Care  Medicine          Date: 04/15/2020,   MRN# [id number] Martha [name] 08/01/1945     AdmissionWeight: 72 kg                 CurrentWeight: 68.2 kg  Referring physician: Dr. Si Raider    CHIEF COMPLAINT:   Acute on chronic hypoxemic respiratory failure   HISTORY OF PRESENT ILLNESS   74 year old female with history of diabetes COPD lifelong smoking hypothyroidism gout major depressive disorder, anxiety disorder and GERD, diverticulitis who came in with signs and symptoms of anasarca including peripheral edema and pulmonary edema.  Patient states is been worse than usual despite compliance with wrapping the lower extremities.  She also reported coughing and dyspnea.  Upon presentation to the emergency room she was found to be desaturating in the mid 80s and was placed on 4 L supplemental oxygen with some subsequent improvement of SPO2.  She was then treated with BiPAP and felt better.  She was initially admitted with acute exacerbation of CHF and cardiology was consulted for this.  She was also found to have proteinuria with CKD and had renal consultation placed. Pulmonary consultation placed for acute on chronic hypoxemic respiratory failure with failure to wean down oxygen.  Patient did have CT PE protocol done to rule out acute pulmonary venous embolism which was negative however did show bullous emphysema with overlying pulmonary edema as well as a right lower lobe consolidated infiltrate suggestive of pneumonia as well as right hemidiaphragm elevation due to hepatomegaly.  04/09/20- patient seen at bedside, remains encephalopathic.  Discussed with [facility] attending Dr Si Raider plan for transfer to SDU.     04/10/20- patient is improved.  She is more awake, she was able to speak with me despite BIPAP mask, she has normal 4/4 grip bilaterally and moves LE to verbal communication without encouragment. Mentation has significantly improved. She is flushed red on  examination.    04/11/20- patient is improved.  She is off BIPAP during my evaluation.  She is able to speak and reported right upper extermity pain, this may be related to K infusion. I dicussed this with RN who has already addressed this with IV team. ABG is in progress. Cardiology and nephrology following, UOP is low.   04/13/20- patient is improved.  She is speaking in full sentences. I spoke to her common law husband to review hospital course and care plan.  Patient will need BIPAP for home, ive discussed case with [facility] team and qualification for NIV is in process.  Spirometry is in process. 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  She smokes actively , I have provided smoking cessation counseling today and will be following up on outpatient for cessation therapy.  LE swelling is resolved and sensorium is close to baseline. There is no diarreah  04/14/20- patient resting in bed. Mildly confused. Breathing is diminished bilaterllly , on 8L HF bubbler. ABG tonight.   04/15/20- patient is resting in bed. Vitals are stable.  Seems patient may have been more confused after Haldol but not from ventilation as her ABG is without hypercapnia.  Nocturnal pulse oximetry is pending for home BIPAP   PAST MEDICAL HISTORY   Past Medical History:  Diagnosis Date  . Abnormal LFTs (liver function tests)   . Anxiety   . Arthritis    knees, feet  . Asthma   . Chronic bronchitis ([place])   . Chronic constipation   .  Chronic low back pain   . Cigarette smoker    Has cut back Smoking to 1 pack every other day  . COPD (chronic obstructive pulmonary disease) ([place])   . Depression   . Diabetic neuropathy ([place])   . Diverticulitis 2015   gi recommended repeat scope in 10 years  . Essential hypertension   . Fatty liver   . GERD (gastroesophageal reflux disease)   . Gout   . History of mammogram 05/28/2013  . Hyperlipidemia   . Hypothyroidism   . Insomnia   . Iron deficiency anemia   . Low  serum vitamin D   . Personal history of tobacco use, presenting hazards to health 09/29/2015  . Plantar fasciitis   . RLS (restless legs syndrome)   . Shortness of breath dyspnea   . Thyroid cancer ([place]) 2009  . Tremors of nervous system    "I think I have parkinson's disease"  . Type 2 diabetes mellitus ([place])   . Wears dentures    uppers     SURGICAL HISTORY   Past Surgical History:  Procedure Laterality Date  . CATARACT EXTRACTION W/PHACO Left 11/24/2015   Procedure: CATARACT EXTRACTION PHACO AND INTRAOCULAR LENS PLACEMENT (IOC);  Surgeon: Birder Robson, MD;  Location: [facility] ORS;  Service: Ophthalmology;  Laterality: Left;  [place] 38.3 AP% 20.6 CDE 7.89 FLUID PACK LOT # [id number]  . CATARACT EXTRACTION W/PHACO Right 06/23/2019   Procedure: CATARACT EXTRACTION PHACO AND INTRAOCULAR LENS PLACEMENT ([place]) RIGHT DIABETIC;  Surgeon: Birder Robson, MD;  Location: [facility];  Service: Ophthalmology;  Laterality: Right;  Diabetic  . COLONOSCOPY  2004, 2009, 2015   [place]  . POLYPECTOMY  2009  . THYROIDECTOMY  2009   states she was tx with radiactive iodine  . TUBAL LIGATION    . UPPER GASTROINTESTINAL ENDOSCOPY       FAMILY HISTORY   Family History  Problem Relation Age of Onset  . Diabetes Mellitus II Father   . Lung disease Father   . Diabetes Mellitus II Mother   . Arthritis Mother   . Thyroid disease Other   . Asthma Sister   . Emphysema Sister   . Hypertension Sister   . Breast cancer Paternal Aunt      SOCIAL HISTORY   Social History   Tobacco Use  . Smoking status: Current Every Day Smoker    Packs/day: 1.00    Years: 39.50    Pack years: 39.50    Types: Cigarettes  . Smokeless tobacco: Never Used  . Tobacco comment: curently smokes 12 cigerattes a day. I'm trying to cut back.  Vaping Use  . Vaping Use: Never used  Substance Use Topics  . Alcohol use: No    Alcohol/week: 0.0 standard drinks  . Drug use: No     MEDICATIONS    Home  Medication:    Current Medication:  Current Facility-Administered Medications:  .  0.9 %  sodium chloride infusion, 250 mL, Intravenous, PRN, Ivor Costa, MD, Stopped at 04/14/20 0100 .  acetaminophen (TYLENOL) suppository 650 mg, 650 mg, Rectal, Q6H PRN, Lang Snow, NP, 650 mg at 04/08/20 2234 .  acetaminophen (TYLENOL) tablet 650 mg, 650 mg, Oral, Q6H PRN, Lorella Nimrod, MD, 650 mg at 04/13/20 1126 .  albuterol (PROVENTIL) (2.5 MG/3ML) 0.083% nebulizer solution 2.5 mg, 2.5 mg, Nebulization, Q4H PRN, Ivor Costa, MD .  amLODipine (NORVASC) tablet 10 mg, 10 mg, Oral, Daily, Wouk, Ailene Rud, MD, 10 mg at 04/14/20 0845 .  ampicillin-sulbactam (UNASYN) 1.5 g in sodium chloride 0.9 % 100 mL IVPB, 1.5 g, Intravenous, Q6H, Ravishankar, Jayashree, MD, Last Rate: 200 mL/hr at 04/15/20 0515, 1.5 g at 04/15/20 0515 .  ascorbic acid (VITAMIN C) tablet 1,000 mg, 1,000 mg, Oral, Daily, Ivor Costa, MD, 1,000 mg at 04/14/20 0844 .  calcium carbonate (TUMS - dosed in mg elemental calcium) chewable tablet 200 mg of elemental calcium, 1 tablet, Oral, Q8H PRN, Nicole Kindred A, DO, 200 mg of elemental calcium at 04/14/20 1824 .  chlorhexidine (PERIDEX) 0.12 % solution 15 mL, 15 mL, Mouth Rinse, BID, Wouk, Ailene Rud, MD, 15 mL at 04/14/20 2152 .  Chlorhexidine Gluconate Cloth 2 % PADS 6 each, 6 each, Topical, Daily, Wouk, Ailene Rud, MD, 6 each at 04/14/20 [phone] .  dextromethorphan-guaiFENesin (MUCINEX DM) 30-600 MG per 12 hr tablet 1 tablet, 1 tablet, Oral, BID PRN, Ivor Costa, MD, 1 tablet at 04/06/20 0945 .  enoxaparin (LOVENOX) injection 40 mg, 40 mg, Subcutaneous, Q24H, Nicole Kindred A, DO, 40 mg at 04/14/20 2147 .  feeding supplement (GLUCERNA SHAKE) (GLUCERNA SHAKE) liquid 237 mL, 237 mL, Oral, TID BM, Nicole Kindred A, DO, 237 mL at 04/14/20 2000 .  fluticasone (FLONASE) 50 MCG/ACT nasal spray 1 spray, 1 spray, Each Nare, BID, Sharion Settler, NP, 1 spray at 04/14/20 0847 .  fluticasone  (FLONASE) 50 MCG/ACT nasal spray 1-2 spray, 1-2 spray, Each Nare, Daily PRN, Ivor Costa, MD .  insulin aspart (novoLOG) injection 0-5 Units, 0-5 Units, Subcutaneous, QHS, Ivor Costa, MD, 2 Units at 04/14/20 2147 .  insulin aspart (novoLOG) injection 0-9 Units, 0-9 Units, Subcutaneous, TID WC, Ivor Costa, MD, 5 Units at 04/14/20 1825 .  insulin glargine (LANTUS) injection 20 Units, 20 Units, Subcutaneous, Daily, Si Raider, Ailene Rud, MD, 20 Units at 04/14/20 [phone] .  ipratropium-albuterol (DUONEB) 0.5-2.5 (3) MG/3ML nebulizer solution 3 mL, 3 mL, Nebulization, TID, Ivor Costa, MD, 3 mL at 04/14/20 2028 .  labetalol (NORMODYNE) injection 20 mg, 20 mg, Intravenous, Q2H PRN, Wouk, Ailene Rud, MD, 20 mg at 04/12/20 1529 .  levothyroxine (SYNTHROID) tablet 150 mcg, 150 mcg, Oral, Q0600, Ivor Costa, MD, 150 mcg at 04/15/20 0514 .  losartan (COZAAR) tablet 100 mg, 100 mg, Oral, Daily, Wouk, Ailene Rud, MD, 100 mg at 04/14/20 0843 .  MEDLINE mouth rinse, 15 mL, Mouth Rinse, q12n4p, Wouk, Ailene Rud, MD, 15 mL at 04/14/20 1630 .  metoprolol tartrate (LOPRESSOR) tablet 50 mg, 50 mg, Oral, BID, Paraschos, Alexander, MD, 50 mg at 04/14/20 2146 .  multivitamin with minerals tablet 1 tablet, 1 tablet, Oral, Daily, Ezekiel Slocumb, DO, 1 tablet at 04/14/20 0842 .  nitroGLYCERIN (NITROSTAT) SL tablet 0.4 mg, 0.4 mg, Sublingual, Q5 min PRN, Wouk, Ailene Rud, MD, 0.4 mg at 04/08/20 0659 .  ondansetron (ZOFRAN) injection 4 mg, 4 mg, Intravenous, Q8H PRN, Ivor Costa, MD, 4 mg at 04/14/20 0904 .  pantoprazole (PROTONIX) EC tablet 40 mg, 40 mg, Oral, Daily, Nicole Kindred A, DO, 40 mg at 04/14/20 0844 .  predniSONE (DELTASONE) tablet 40 mg, 40 mg, Oral, Q breakfast, Ambrosio Reuter, MD, 40 mg at 04/14/20 0842 .  primidone (MYSOLINE) tablet 50 mg, 50 mg, Oral, BID, Nicole Kindred A, DO, 50 mg at 04/14/20 2147 .  rosuvastatin (CRESTOR) tablet 5 mg, 5 mg, Oral, Daily, Ivor Costa, MD, 5 mg at 04/14/20 0844 .   sertraline (ZOLOFT) tablet 50 mg, 50 mg, Oral, Daily, Nicole Kindred A, DO, 50 mg at 04/14/20 0843 .  sodium chloride  flush (NS) 0.9 % injection 3 mL, 3 mL, Intravenous, Q12H, Ivor Costa, MD, 3 mL at 04/14/20 2154 .  sodium chloride flush (NS) 0.9 % injection 3 mL, 3 mL, Intravenous, PRN, Ivor Costa, MD .  vitamin B-12 (CYANOCOBALAMIN) tablet 1,000 mcg, 1,000 mcg, Oral, Daily, Ivor Costa, MD, 1,000 mcg at 04/14/20 0845    ALLERGIES   Nicotine polacrilex, Nicotrol [nicotine], and Codeine sulfate     REVIEW OF SYSTEMS    Review of Systems:  Unable to obtain due to encephalopathy   VS: BP (!) 153/68 (BP Location: Right Arm)   Pulse 75   Temp 97.9 F (36.6 C)   Resp 16   Ht _0  (1.626 m)   Wt 68.2 kg   SpO2 98%   BMI 25.81 kg/m      PHYSICAL EXAM    GENERAL:NAD, no fevers, chills, no weakness no fatigue HEAD: Normocephalic, atraumatic.  EYES: Pupils equal, round, reactive to light. Extraocular muscles intact. No scleral icterus.  MOUTH: dry mucosal membrane. Dentition intact. No abscess noted. On BIPAP EAR, NOSE, THROAT: Clear without exudates. No external lesions.  NECK: Supple. No thyromegaly. No nodules. No JVD.  PULMONARY: mild rhonchi bilaterally  CARDIOVASCULAR: S1 and S2. Regular rate and rhythm. No murmurs, rubs, or gallops. No edema. Pedal pulses 2+ bilaterally.  GASTROINTESTINAL: Soft, nontender, nondistended. No masses. Positive bowel sounds. No hepatosplenomegaly.  MUSCULOSKELETAL: No swelling, clubbing, +edema 2++.  NEUROLOGIC: GCS 10  SKIN: No ulceration, lesions, rashes, or cyanosis. Skin warm and dry. Turgor intact.       IMAGING    DG Chest 1 View  Result Date: 04/09/2020 CLINICAL DATA:  Acute respiratory failure EXAM: CHEST  1 VIEW COMPARISON:  04/03/2016 FINDINGS: Normal cardiac silhouette. Interval increase in interstitial edema pattern. No focal consolidation. No pneumothorax. IMPRESSION: Worsening interstitial edema. Electronically  Signed   By: Suzy Bouchard M.D.   On: 04/09/2020 06:31   DG Chest 2 View  Result Date: 04/03/2020 CLINICAL DATA:  Hypoxia. Leg edema and shortness of breath. Pt daughter states that pt is supposed to be getting a renal bx but they are not able to do it because of the edema in pts legs. Pt states that she does feel short of breath. Pt does not wear oxygen at home. Pt placed on O2 in triage due to sats being in the 80s. Hx of asthma, chronic bronchitis, COPD, thyroid cancer- 2009, DM, current smoker. EXAM: CHEST - 2 VIEW COMPARISON:  01/04/2018 FINDINGS: Cardiac silhouette is borderline enlarged. Stable aortic atherosclerotic calcifications. No mediastinal or hilar masses. No evidence of adenopathy. Lungs demonstrate diffuse bilateral irregular interstitial thickening which has increased compared to the prior study. There is also linear/reticular scarring in the left lung base, which is stable. No lung consolidation. No pleural effusion and no pneumothorax. Skeletal structures are demineralized, but grossly intact. IMPRESSION: 1. Bilateral irregular interstitial thickening, increased compared to the prior study, along with borderline cardiomegaly. Suspect mild congestive heart failure. Consider diffuse interstitial infection or inflammation in the proper clinical setting. No evidence of lobar pneumonia. Electronically Signed   By: Lajean Manes M.D.   On: 04/03/2020 13:06   CT CHEST W CONTRAST  Result Date: 04/06/2020 CLINICAL DATA:  Hemoptysis. EXAM: CT CHEST WITH CONTRAST TECHNIQUE: Multidetector CT imaging of the chest was performed during intravenous contrast administration. CONTRAST:  30m OMNIPAQUE IOHEXOL 300 MG/ML  SOLN COMPARISON:  January 06, 2019 FINDINGS: Cardiovascular: There is marked severity calcification of the aortic arch. There is mild  cardiomegaly. No pericardial effusion. Mediastinum/Nodes: There is mild pretracheal and right hilar lymphadenopathy. Thyroid gland, trachea, and esophagus  demonstrate no significant findings. Lungs/Pleura: Mild-to-moderate severity emphysematous lung disease is seen involving the bilateral upper lobes with stable bullous disease is seen along the medial aspect of the left apex. Mild to moderate severity areas of atelectasis and/or infiltrate are seen within the posterior aspects of the bilateral upper lobes and left lung base. Moderate to marked severity posterior right basilar atelectasis and/or infiltrate is noted. A very small right pleural effusion is seen. No pneumothorax is identified. Upper Abdomen: There is stable hepatosplenomegaly with stable, moderate severity diffuse bilateral adrenal gland enlargement. Musculoskeletal: A chronic lateral eighth left rib fracture is seen. A chronic compression fracture deformity of the T12 vertebral body is noted. IMPRESSION: 1. Mild-to-moderate severity posterior bilateral upper lobe and left basilar atelectasis and/or infiltrate. 2. Moderate to marked severity posterior right basilar atelectasis and/or infiltrate. 3. Very small right pleural effusion. 4. Mild cardiomegaly. 5. Stable emphysematous lung disease. 6. Chronic lateral eighth left rib fracture. 7. Chronic compression fracture deformity of the T12 vertebral body. 8. Aortic atherosclerosis. Aortic Atherosclerosis (ICD10-I70.0) and Emphysema (ICD10-J43.9). Electronically Signed   By: Virgina Norfolk M.D.   On: 04/06/2020 15:33   CT ANGIO CHEST PE W OR WO CONTRAST  Result Date: 04/07/2020 CLINICAL DATA:  Shortness of breath EXAM: CT ANGIOGRAPHY CHEST WITH CONTRAST TECHNIQUE: Multidetector CT imaging of the chest was performed using the standard protocol during bolus administration of intravenous contrast. Multiplanar CT image reconstructions and MIPs were obtained to evaluate the vascular anatomy. CONTRAST:  60m OMNIPAQUE IOHEXOL 350 MG/ML SOLN COMPARISON:  April 06, 2020 FINDINGS: Cardiovascular: There is a optimal opacification of the pulmonary arteries.  There is no central,segmental, or subsegmental filling defects within the pulmonary arteries. There is unchanged cardiomegaly. Mitral valve calcifications and coronary artery calcifications are seen. No pericardial effusion or thickening. No evidence right heart strain. There is normal three-vessel brachiocephalic anatomy without proximal stenosis. Scattered aortic atherosclerosis is noted. Mediastinum/Nodes: No hilar, mediastinal, or axillary adenopathy. Thyroid gland, trachea, and esophagus demonstrate no significant findings. Lungs/Pleura: Patchy airspace opacity seen at the left upper lung and posterior right lung base with air bronchograms. There are trace bilateral pleural effusions present. Centrilobular and paraseptal emphysematous changes seen at both lung apices. Upper Abdomen: No acute abnormalities present in the visualized portions of the upper abdomen. Musculoskeletal: No chest wall abnormality. No acute or significant osseous findings. There is a chronic anterior wedge compression deformity of the T12 vertebral body with 50% loss in vertebral body height. Review of the MIP images confirms the above findings. IMPRESSION: No central, segmental, or subsegmental pulmonary embolism. Unchanged multifocal patchy airspace opacities within both lungs which could be due to multifocal pneumonia Trace bilateral pleural effusions, right greater than left. Aortic Atherosclerosis (ICD10-I70.0). Electronically Signed   By: BPrudencio PairM.D.   On: 04/07/2020 19:43   CT CHEST ABDOMEN PELVIS W CONTRAST  Result Date: 04/12/2020 CLINICAL DATA:  Shortness of breath, abdominal pain, and bilateral leg swelling. EXAM: CT CHEST, ABDOMEN, AND PELVIS WITH CONTRAST TECHNIQUE: Multidetector CT imaging of the chest, abdomen and pelvis was performed following the standard protocol during bolus administration of intravenous contrast. CONTRAST:  1026mOMNIPAQUE IOHEXOL 300 MG/ML  SOLN COMPARISON:  Right upper quadrant ultrasound  from yesterday. CTA chest dated April 07, 2020. FINDINGS: CT CHEST FINDINGS Cardiovascular: Unchanged borderline cardiomegaly. No pericardial effusion. No thoracic aortic aneurysm or dissection. Coronary, aortic arch, and branch vessel  atherosclerotic vascular disease. 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 No central pulmonary embolism. Mediastinum/Nodes: Unchanged mildly enlarged precarinal lymph node measuring 12 mm in short axis, stable since September 2020, likely reactive. No enlarged hilar or axillary lymph nodes. Prior thyroidectomy. Prominent circumferential wall thickening of the proximal and mid esophagus, slightly worsened since the prior study. Lungs/Pleura: Slightly increased small right pleural effusion. New trace left pleural effusion. Prominent atelectasis within the right lower lobe with patchy areas of non enhancement and air bronchograms consistent with underlying pneumonia, slightly worsened when compared to prior study. Increased patchy consolidation in both posterior upper lobes. Minimal subsegmental atelectasis in the left lower lobe. Moderate centrilobular and mild paraseptal emphysema again noted with bullous changes in the medial left lung apex. No pneumothorax. 7 mm subpleural nodule in the left upper lobe (series 4, image 39), similar to most recent chest CTs, but slightly more conspicuous when compared to CT chest from September 2020. Musculoskeletal: No acute or significant osseous findings. Unchanged chronic T12 compression deformity. Old left eighth rib fracture. CT ABDOMEN PELVIS FINDINGS Hepatobiliary: Slightly lobular liver contour. No focal liver abnormality is seen. No gallstones, gallbladder wall thickening, or biliary dilatation. Pancreas: Unremarkable. No pancreatic ductal dilatation or surrounding inflammatory changes. Spleen: Normal in size without focal abnormality.  Adrenals/Urinary Tract: Chronic bilateral adrenal hypertrophy. No renal calculi, focal lesion, or hydronephrosis. The bladder is unremarkable. Stomach/Bowel: Stomach is within normal limits. Appendix appears normal. No evidence of bowel wall thickening, distention, or inflammatory changes. Prominent sigmoid colonic diverticulosis. Vascular/Lymphatic: Aortic atherosclerosis. 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 No enlarged abdominal or pelvic lymph nodes. Reproductive: Uterus and bilateral adnexa are unremarkable. Other: Scattered trace ascites. No pneumoperitoneum. Small fat containing paraumbilical hernias. Musculoskeletal: No acute or significant osseous findings. IMPRESSION: CT chest: 1. Mildly worsened multifocal pneumonia. 2. Slightly increased small right pleural effusion. New trace left pleural effusion. 3. 7 mm subpleural nodule in the left upper lobe, similar to most recent chest CTs, but slightly more conspicuous when compared to CT chest from September 2020. While this may be infectious or inflammatory, attention on follow-up imaging is recommended. 4. Prominent circumferential wall thickening of the proximal and mid esophagus, slightly worsened since the prior study, concerning for esophagitis. 5. 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 6. Aortic Atherosclerosis (ICD10-I70.0) and Emphysema (ICD10-J43.9). CT abdomen and pelvis: 1. No acute intra-abdominal process. 2. Slightly lobular liver contour with scattered trace ascites, suggestive of cirrhosis. 3. 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 Electronically Signed   By: Titus Dubin M.D.   On: 04/12/2020 13:51   US Venous Img Lower Bilateral (DVT)  Result Date: 04/03/2020 CLINICAL DATA:  Lower leg cellulitis EXAM: BILATERAL LOWER EXTREMITY VENOUS DOPPLER ULTRASOUND TECHNIQUE: Gray-scale sonography with graded compression,  as well as color Doppler and duplex ultrasound were performed to evaluate the lower extremity deep venous systems from the level of the common femoral vein and including the common femoral, femoral, profunda femoral, popliteal and calf veins including the posterior tibial, peroneal and gastrocnemius veins when visible. The superficial great saphenous vein was also interrogated. Spectral Doppler was utilized to evaluate flow at rest and with distal augmentation maneuvers in the common femoral, femoral and popliteal veins. COMPARISON:  None. FINDINGS: RIGHT LOWER EXTREMITY Common Femoral Vein: No evidence of thrombus. Normal compressibility, respiratory phasicity and response to augmentation. Saphenofemoral Junction: No  evidence of thrombus. Normal compressibility and flow on color Doppler imaging. Profunda Femoral Vein: No evidence of thrombus. Normal compressibility and flow on color Doppler imaging. Femoral Vein: No evidence of thrombus. Normal compressibility, respiratory phasicity and response to augmentation. Popliteal Vein: Not well visualized due to overlying edema. Calf Veins: Not well visualized due to overlying edema. Superficial Great Saphenous Vein: No evidence of thrombus. Normal compressibility. Venous Reflux:  None. Other Findings:  None. LEFT LOWER EXTREMITY Common Femoral Vein: No evidence of thrombus. Normal compressibility, respiratory phasicity and response to augmentation. Saphenofemoral Junction: No evidence of thrombus. Normal compressibility and flow on color Doppler imaging. Profunda Femoral Vein: No evidence of thrombus. Normal compressibility and flow on color Doppler imaging. Femoral Vein: No evidence of thrombus. Normal compressibility, respiratory phasicity and response to augmentation. Popliteal Vein: Not well visualized due to overlying edema. Calf Veins: Not well visualized due to overlying edema. Superficial Great Saphenous Vein: No evidence of thrombus. Normal compressibility.  Venous Reflux:  None. Other Findings:  None. IMPRESSION: Somewhat limited exam due to peripheral edema although no central deep venous thrombosis is noted. Electronically Signed   By: Inez Catalina M.D.   On: 04/03/2020 15:58   ECHOCARDIOGRAM COMPLETE  Result Date: 04/04/2020    ECHOCARDIOGRAM REPORT   Patient Name:   MCKYNZI CAMMON Date of Exam: 04/04/2020 Medical Rec #:  [id number]      Height:       64.0 in Accession #:    [id number]     Weight:       181.8 lb Date of Birth:  1945/07/25     BSA:          1.879 m Patient Age:    35 years       BP:           171/78 mmHg Patient Gender: F              HR:           98 bpm. Exam Location:  [facility] Procedure: 2D Echo, Color Doppler, Cardiac Doppler and Intracardiac            Opacification Agent Indications:     I50.31 CHF-Acute Diastolic  History:         Patient has no prior history of Echocardiogram examinations.                  COPD, Signs/Symptoms:Shortness of Breath; Risk Factors:Diabetes                  and Current Smoker.  Sonographer:     Charmayne Sheer RDCS (AE) Referring Phys:  Baker Janus Soledad Gerlach NIU Diagnosing Phys: Bartholome Bill MD  Sonographer Comments: Technically difficult study due to poor echo windows. Image acquisition challenging due to patient body habitus and Image acquisition challenging due to COPD. IMPRESSIONS  1. Left ventricular ejection fraction, by estimation, is 55 to 60%. The left ventricle has normal function. The left ventricle has no regional wall motion abnormalities. Left ventricular diastolic parameters are consistent with Grade I diastolic dysfunction (impaired relaxation).  2. Right ventricular systolic function is normal. The right ventricular size is mildly enlarged.  3. Left atrial size was mildly dilated.  4. Right atrial size was mildly dilated.  5. The mitral valve was not well visualized. Trivial mitral valve regurgitation.  6. The aortic valve was not well visualized. Aortic valve regurgitation is trivial. FINDINGS  Left Ventricle:  Left ventricular ejection fraction, by estimation, is 55 to 60%.  The left ventricle has normal function. The left ventricle has no regional wall motion abnormalities. Definity contrast agent was given IV to delineate the left ventricular  endocardial borders. The left ventricular internal cavity size was normal in size. There is borderline left ventricular hypertrophy. Left ventricular diastolic parameters are consistent with Grade I diastolic dysfunction (impaired relaxation). Right Ventricle: The right ventricular size is mildly enlarged. No increase in right ventricular wall thickness. Right ventricular systolic function is normal. Left Atrium: Left atrial size was mildly dilated. Right Atrium: Right atrial size was mildly dilated. Pericardium: There is no evidence of pericardial effusion. Mitral Valve: The mitral valve was not well visualized. Trivial mitral valve regurgitation. MV peak gradient, 7.4 mmHg. The mean mitral valve gradient is 3.0 mmHg. Tricuspid Valve: The tricuspid valve is not well visualized. Tricuspid valve regurgitation is trivial. Aortic Valve: The aortic valve was not well visualized. Aortic valve regurgitation is trivial. Aortic valve mean gradient measures 3.0 mmHg. Aortic valve peak gradient measures 6.8 mmHg. Aortic valve area, by VTI measures 2.46 cm. Pulmonic Valve: The pulmonic valve was not well visualized. Pulmonic valve regurgitation is not visualized. Aorta: The aortic root was not well visualized. IAS/Shunts: The interatrial septum was not assessed.  LEFT VENTRICLE PLAX 2D LVIDd:         4.15 cm     Diastology LVIDs:         3.64 cm     LV e' medial:    5.98 cm/s LV PW:         1.43 cm     LV E/e' medial:  18.3 LV IVS:        1.12 cm     LV e' lateral:   6.53 cm/s LVOT diam:     2.00 cm     LV E/e' lateral: 16.8 LV SV:         55 LV SV Index:   29 LVOT Area:     3.14 cm  LV Volumes (MOD) LV vol d, MOD A2C: 55.5 ml LV vol d, MOD A4C: 63.8 ml LV vol s, MOD A2C: 34.3 ml LV vol s,  MOD A4C: 23.4 ml LV SV MOD A2C:     21.2 ml LV SV MOD A4C:     63.8 ml LV SV MOD BP:      34.2 ml RIGHT VENTRICLE RV Basal diam:  3.76 cm LEFT ATRIUM             Index       RIGHT ATRIUM           Index LA diam:        4.90 cm 2.61 cm/m  RA Area:     19.80 cm LA Vol (A2C):   59.5 ml 31.67 ml/m RA Volume:   61.10 ml  32.52 ml/m LA Vol (A4C):   57.5 ml 30.61 ml/m LA Biplane Vol: 61.2 ml 32.58 ml/m  AORTIC VALVE                   PULMONIC VALVE AV Area (Vmax):    2.51 cm    PV Vmax:       0.76 m/s AV Area (Vmean):   2.43 cm    PV Vmean:      53.200 cm/s AV Area (VTI):     2.46 cm    PV VTI:        0.126 m AV Vmax:           130.00 cm/s  PV Peak grad:  2.3 mmHg AV Vmean:          83.500 cm/s PV Mean grad:  1.0 mmHg AV VTI:            0.225 m AV Peak Grad:      6.8 mmHg AV Mean Grad:      3.0 mmHg LVOT Vmax:         104.00 cm/s LVOT Vmean:        64.700 cm/s LVOT VTI:          0.176 m LVOT/AV VTI ratio: 0.78  AORTA Ao Root diam: 3.10 cm MITRAL VALVE                TRICUSPID VALVE MV Area (PHT): 3.00 cm     TR Peak grad:   28.9 mmHg MV Peak grad:  7.4 mmHg     TR Vmax:        269.00 cm/s MV Mean grad:  3.0 mmHg MV Vmax:       1.36 m/s     SHUNTS MV Vmean:      86.9 cm/s    Systemic VTI:  0.18 m MV Decel Time: 253 msec     Systemic Diam: 2.00 cm MV E velocity: 109.50 cm/s MV A velocity: 137.50 cm/s MV E/A ratio:  0.80 Bartholome Bill MD Electronically signed by Bartholome Bill MD Signature Date/Time: 04/04/2020/4:54:02 PM    Final    US Abdomen Limited RUQ (LIVER/GB)  Result Date: 04/11/2020 CLINICAL DATA:  Hyperbilirubinemia in a 74 year old female EXAM: ULTRASOUND ABDOMEN LIMITED RIGHT UPPER QUADRANT COMPARISON:  April 07, 2020 CT angiography of the chest and CT of the chest of April 06, 2020 FINDINGS: Gallbladder: Wall thickness at upper limits of normal. No visible pericholecystic fluid or evidence of cholelithiasis. No reported tenderness over the gallbladder. Common bile duct: Diameter: 2.2 mm Liver:  Mildly heterogeneous echotexture with nodular contour and suggestion of fissural widening particularly on image 22. No visible lesion on submitted images. Portal vein is patent on color Doppler imaging with normal direction of blood flow towards the liver. Other: Trace ascites IMPRESSION: 1. Heterogeneous, mildly heterogeneous hepatic echotexture with question of lobular contour and fissural widening, findings could be seen in the setting of early liver disease. 2. Gallbladder wall thickness at upper limits of normal could be seen in the setting of liver disease and is nonspecific, not associated with biliary calculi, reported tenderness or biliary duct dilation. Electronically Signed   By: Zetta Bills M.D.   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  -12/15- DCD lasix , DCD solumedrol , started PO prednisone taper at 12m po daily  04/14/20- patient on bubbler 8L.  04/14/20- patient weaned to 7L    Altered mental status with confusion-imporoved  -severe hypercapnic 3encephalopathy -Meds have been adjusted and mentation is improved -will obtain ABG to evaluate hypercanic encephalopathy -continue on BIPAP -[facility] - BIPAP qualifiacation - spirometry in process s/p ABG       Thank you for allowing me to participate  in the care of this patient.    Patient/Family are satisfied with care plan and all questions have been answered.   This document was prepared using Dragon voice recognition  software and may include unintentional dictation errors.     Ottie Glazier, M.D.  Division of [place]

## 2020-04-15 NOTE — Progress Notes (Signed)
[facility]  ROUNDING NOTE   Subjective:   Patient resting in bed, denies worsening SOB, nausea or vomiting.She reports 'feeling tired'.   Objective:  Vital signs in last 24 hours:  Temp:  [97.5 F (36.4 C)-98.2 F (36.8 C)] 98.2 F (36.8 C) (12/17 1128) Pulse Rate:  [57-75] 64 (12/17 1356) Resp:  [16-18] 16 (12/17 1128) BP: (139-171)/(59-68) 139/63 (12/17 1356) SpO2:  [83 %-98 %] 94 % (12/17 1128) Weight:  [68.2 kg-68.7 kg] 68.2 kg (12/17 0439)  Weight change: 0.816 kg Filed Weights   04/14/20 0413 04/15/20 0332 04/15/20 0439  Weight: 67.9 kg 68.7 kg 68.2 kg    Intake/Output: I/O last 3 completed shifts: In: 1570 [P.O.:1460; I.V.:10; IV Piggyback:100] Out: 2675 [Urine:2675]   Intake/Output this shift:  Total I/O In: 480 [P.O.:480] Out: 625 [Urine:625]  Physical Exam: General:  Resting in bed, in no acute distress  Head: Normocephalic, atraumatic  Eyes:  Sclerae and conjunctivae clear  Lungs:   Mild rhonchi bilaterally, Respirations symmetrical, unlabored  on 7L O2 via HFNC  Heart:  S1S2 , no rubs or gallops  Abdomen:   Soft, nontender, nondistended  Extremities:  No peripheral edema  Neurologic:  Awake, alert, appears slightly lethargic  Skin:  Areas of ecchymoses on bilateral arms,resolving, bilateral lower legs wrapped with Coban    Basic Metabolic Panel: Recent Labs  Lab 04/11/20 0428 04/12/20 0623 04/13/20 0441 04/14/20 0530 04/15/20 0417  NA 147* 142 141 145 142  K 3.3* 3.1* 4.1 4.3 4.4  CL 94* 84* 95* 99 101  CO2 39* 44* 35* 35* 31  GLUCOSE 220* 328* 206* 173* 98  BUN 40* 37* 30* 29* 26*  CREATININE 0.62 0.55 0.55 0.47 0.50  CALCIUM 9.2 9.3 8.9 9.2 9.2  MG 2.2 2.2 2.0 2.3 2.2    Liver Function Tests: Recent Labs  Lab 04/11/20 0428 04/12/20 0623 04/13/20 0441 04/14/20 0530 04/15/20 0417  AST 19 25 18  35 27  ALT 16 29 27  41 44  ALKPHOS 74 84 77 118 150*  BILITOT 2.1* 2.2* 1.6* 1.3* 1.2  PROT 7.0 6.6 6.3* 6.3* 6.0*   ALBUMIN 4.4 4.1 3.8 3.8 3.5   Recent Labs  Lab 04/09/20 0438  LIPASE 27   No results for input(s): AMMONIA in the last 168 hours.  CBC: Recent Labs  Lab 04/11/20 0428 04/12/20 0623 04/13/20 0441 04/14/20 0530 04/15/20 0417  WBC 13.9* 16.2* 16.6* 14.1* 14.0*  HGB 15.3* 15.7* 14.7 14.9 14.3  HCT 51.1* 51.5* 48.3* 47.3* 45.6  MCV 94.1 91.8 92.0 90.6 91.0  PLT 191 179 186 188 180    Cardiac Enzymes: No results for input(s): CKTOTAL, CKMB, CKMBINDEX, TROPONINI in the last 168 hours.  BNP: Invalid input(s): POCBNP  CBG: Recent Labs  Lab 04/14/20 1609 04/14/20 1948 04/14/20 2133 04/15/20 0724 04/15/20 1129  GLUCAP 235* 338* 248* 188* 266*    Microbiology: Results for orders placed or performed during the hospital encounter of 04/03/20  Resp Panel by RT-PCR (Flu A&B, Covid) Nasopharyngeal Swab     Status: None   Collection Time: 04/03/20 11:41 AM   Specimen: Nasopharyngeal Swab; Nasopharyngeal(NP) swabs in vial transport medium  Result Value Ref Range Status   SARS Coronavirus 2 by RT PCR NEGATIVE NEGATIVE Final    Comment: (NOTE) SARS-CoV-2 target nucleic acids are NOT DETECTED.  The SARS-CoV-2 RNA is generally detectable in upper respiratory specimens during the acute phase of infection. The lowest concentration of SARS-CoV-2 viral copies this assay can detect is 138  copies/mL. A negative result does not preclude SARS-Cov-2 infection and should not be used as the sole basis for treatment or other patient management decisions. A negative result may occur with  improper specimen collection/handling, submission of specimen other than nasopharyngeal swab, presence of viral mutation(s) within the areas targeted by this assay, and inadequate number of viral copies(<138 copies/mL). A negative result must be combined with clinical observations, patient history, and epidemiological information. The expected result is Negative.  Fact Sheet for Patients:   EntrepreneurPulse.com.au  Fact Sheet for Healthcare Providers:  IncredibleEmployment.be  This test is no t yet approved or cleared by the [place] FDA and  has been authorized for detection and/or diagnosis of SARS-CoV-2 by FDA under an Emergency Use Authorization (EUA). This EUA will remain  in effect (meaning this test can be used) for the duration of the COVID-19 declaration under Section 564(b)(1) of the Act, 21 U.S.C.section 360bbb-3(b)(1), unless the authorization is terminated  or revoked sooner.       Influenza A by PCR NEGATIVE NEGATIVE Final   Influenza B by PCR NEGATIVE NEGATIVE Final    Comment: (NOTE) The Xpert Xpress SARS-CoV-2/FLU/RSV plus assay is intended as an aid in the diagnosis of influenza from Nasopharyngeal swab specimens and should not be used as a sole basis for treatment. Nasal washings and aspirates are unacceptable for Xpert Xpress SARS-CoV-2/FLU/RSV testing.  Fact Sheet for Patients: EntrepreneurPulse.com.au  Fact Sheet for Healthcare Providers: IncredibleEmployment.be  This test is not yet approved or cleared by the [place] FDA and has been authorized for detection and/or diagnosis of SARS-CoV-2 by FDA under an Emergency Use Authorization (EUA). This EUA will remain in effect (meaning this test can be used) for the duration of the COVID-19 declaration under Section 564(b)(1) of the Act, 21 U.S.C. section 360bbb-3(b)(1), unless the authorization is terminated or revoked.  Performed at [facility], [address] [place], [place] [address]   Culture, blood (Routine X 2) w Reflex to ID Panel     Status: None   Collection Time: 04/03/20  3:27 PM   Specimen: BLOOD  Result Value Ref Range Status   Specimen Description BLOOD BLOOD RIGHT ARM  Final   Special Requests   Final    BOTTLES DRAWN AEROBIC AND ANAEROBIC Blood Culture adequate volume   Culture    Final    NO GROWTH 5 DAYS Performed at [facility], [address] [place], [place] [address]    Report Status 04/08/2020 FINAL  Final  Urine Culture     Status: Abnormal   Collection Time: 04/03/20  4:00 PM   Specimen: Urine, Random  Result Value Ref Range Status   Specimen Description   Final    URINE, RANDOM Performed at [facility], [address], [place], [place] [address]    Special Requests   Final    NONE Performed at [facility], [address] [place], [place] [address]    Culture (A)  Final    >=100,000 COLONIES/mL ESCHERICHIA COLI >=100,000 COLONIES/mL AEROCOCCUS URINAE Standardized susceptibility testing for this organism is not available. Performed at [facility], [place] [address] [place],  [address]    Report Status 04/07/2020 FINAL  Final   Organism ID, Bacteria ESCHERICHIA COLI (A)  Final      Susceptibility   Escherichia coli - MIC*    AMPICILLIN 4 SENSITIVE Sensitive     CEFAZOLIN <=4 SENSITIVE Sensitive     CEFEPIME <=0.12 SENSITIVE Sensitive  CEFTRIAXONE <=0.25 SENSITIVE Sensitive     CIPROFLOXACIN <=0.25 SENSITIVE Sensitive     GENTAMICIN <=1 SENSITIVE Sensitive     IMIPENEM <=0.25 SENSITIVE Sensitive     NITROFURANTOIN 32 SENSITIVE Sensitive     TRIMETH/SULFA <=20 SENSITIVE Sensitive     AMPICILLIN/SULBACTAM <=2 SENSITIVE Sensitive     PIP/TAZO <=4 SENSITIVE Sensitive     * >=100,000 COLONIES/mL ESCHERICHIA COLI  Culture, blood (Routine X 2) w Reflex to ID Panel     Status: None   Collection Time: 04/03/20 10:36 PM   Specimen: BLOOD  Result Value Ref Range Status   Specimen Description BLOOD BLOOD LEFT HAND  Final   Special Requests   Final    BOTTLES DRAWN AEROBIC AND ANAEROBIC Blood Culture adequate volume   Culture   Final    NO GROWTH 5 DAYS Performed at [facility], [address], [place], [place] [address]    Report Status 04/08/2020 FINAL  Final  Urine Culture     Status:  None   Collection Time: 04/08/20  9:52 AM   Specimen: Urine, Random  Result Value Ref Range Status   Specimen Description   Final    URINE, RANDOM Performed at [facility], [address], [place], [place] [address]    Special Requests   Final    NONE Performed at [facility], [address], [place], [place] [address]    Culture   Final    NO GROWTH Performed at [facility], [place] [address], [place], [place] [address]    Report Status 04/09/2020 FINAL  Final  CULTURE, BLOOD (ROUTINE X 2) w Reflex to ID Panel     Status: None   Collection Time: 04/08/20 10:02 AM   Specimen: BLOOD  Result Value Ref Range Status   Specimen Description BLOOD BLOOD RIGHT HAND  Final   Special Requests   Final    BOTTLES DRAWN AEROBIC AND ANAEROBIC Blood Culture results may not be optimal due to an inadequate volume of blood received in culture bottles   Culture   Final    NO GROWTH 5 DAYS Performed at [facility], [address] [place], [place] [address]    Report Status 04/13/2020 FINAL  Final  MRSA PCR Screening     Status: None   Collection Time: 04/08/20 10:04 AM   Specimen: Nasal Mucosa; Nasopharyngeal  Result Value Ref Range Status   MRSA by PCR NEGATIVE NEGATIVE Final    Comment:        The GeneXpert MRSA Assay (FDA approved for NASAL specimens only), is one component of a comprehensive MRSA colonization surveillance program. It is not intended to diagnose MRSA infection nor to guide or monitor treatment for MRSA infections. Performed at [facility], [address] [place], [place] [address]   CULTURE, BLOOD (ROUTINE X 2) w Reflex to ID Panel     Status: None   Collection Time: 04/08/20 10:26 AM   Specimen: BLOOD  Result Value Ref Range Status   Specimen Description BLOOD BLOOD RIGHT HAND  Final   Special Requests   Final    BOTTLES DRAWN AEROBIC AND ANAEROBIC Blood Culture results may not be optimal due to an inadequate volume  of blood received in culture bottles   Culture   Final    NO GROWTH 5 DAYS Performed at [facility], [address], [place], [place] [address]    Report Status 04/13/2020 FINAL  Final  Urine  Culture     Status: None   Collection Time: 04/10/20  6:21 PM   Specimen: Urine, Random  Result Value Ref Range Status   Specimen Description   Final    URINE, RANDOM Performed at [facility], [address], [place], [place] [address]    Special Requests   Final    NONE Performed at [facility], [address], [place], [place] [address]    Culture   Final    NO GROWTH Performed at [facility], [place] [address] [place], [place] [address]    Report Status 04/11/2020 FINAL  Final  MRSA PCR Screening     Status: None   Collection Time: 04/12/20  3:25 PM   Specimen: Nasopharyngeal  Result Value Ref Range Status   MRSA by PCR NEGATIVE NEGATIVE Final    Comment:        The GeneXpert MRSA Assay (FDA approved for NASAL specimens only), is one component of a comprehensive MRSA colonization surveillance program. It is not intended to diagnose MRSA infection nor to guide or monitor treatment for MRSA infections. Performed at [facility], [address] [place], [place] [address]     Coagulation Studies: No results for input(s): LABPROT, INR in the last 72 hours.  Urinalysis: No results for input(s): COLORURINE, LABSPEC, PHURINE, GLUCOSEU, HGBUR, BILIRUBINUR, KETONESUR, PROTEINUR, UROBILINOGEN, NITRITE, LEUKOCYTESUR in the last 72 hours.  Invalid input(s): APPERANCEUR    Imaging: DG Chest Port 1 View  Result Date: 04/15/2020 CLINICAL DATA:  Shortness of breath with soft tissue edema EXAM: PORTABLE CHEST 1 VIEW COMPARISON:  Chest radiograph April 09, 2020 and chest CT April 12, 2020 FINDINGS: There is diffuse interstitial prominence, likely representing a combination of fibrosis and interstitial pulmonary edema. There is  atelectatic change in the left mid lung. There are small pleural effusions bilaterally. No consolidation. There is cardiomegaly with a degree of pulmonary venous hypertension. There is aortic atherosclerosis. No appreciable adenopathy. Foci of calcification noted in each carotid artery. Calcification noted in the lateral left shoulder, stable. IMPRESSION: Suspect combination of interstitial pulmonary edema and underlying fibrosis. No consolidation. Small pleural effusions bilaterally. Cardiomegaly with pulmonary vascular congestion. Suspect a degree of underlying congestive heart failure. Aortic Atherosclerosis (ICD10-I70.0). Electronically Signed   By: Lowella Grip III M.D.   On: 04/15/2020 09:01     Medications:   . sodium chloride 250 mL (04/15/20 1256)  . ampicillin-sulbactam (UNASYN) IV 1.5 g (04/15/20 1258)   . amLODipine  10 mg Oral Daily  . vitamin C  1,000 mg Oral Daily  . chlorhexidine  15 mL Mouth Rinse BID  . Chlorhexidine Gluconate Cloth  6 each Topical Daily  . enoxaparin (LOVENOX) injection  40 mg Subcutaneous Q24H  . feeding supplement (GLUCERNA SHAKE)  237 mL Oral TID BM  . fluticasone  1 spray Each Nare BID  . insulin aspart  0-5 Units Subcutaneous QHS  . insulin aspart  0-9 Units Subcutaneous TID WC  . insulin glargine  20 Units Subcutaneous Daily  . ipratropium-albuterol  3 mL Nebulization TID  . levothyroxine  150 mcg Oral Q0600  . losartan  100 mg Oral Daily  . mouth rinse  15 mL Mouth Rinse q12n4p  . metoprolol tartrate  50 mg Oral BID  . multivitamin with minerals  1 tablet Oral Daily  . pantoprazole  40 mg Oral Daily  . predniSONE  40 mg Oral Q breakfast  . primidone  50 mg Oral BID  .  rosuvastatin  5 mg Oral Daily  . sertraline  50 mg Oral Daily  . sodium chloride flush  3 mL Intravenous Q12H  . vitamin B-12  1,000 mcg Oral Daily   sodium chloride, acetaminophen, acetaminophen, albuterol, calcium carbonate, dextromethorphan-guaiFENesin, fluticasone,  labetalol, nitroGLYCERIN, ondansetron (ZOFRAN) IV, sodium chloride flush  Assessment/ Plan:  74 y.o. female  Martha [name] is a 74 y.o. 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  #Proteinuria, 17 g.   # Nephrotic syndrome with anasarca.   Renal biopsy on hold, awaiting respiratory status to get improved  #Hypokalemia K+4.4 today  #Acute hypoxic respiratory failure  With right lower lobe pneumonia.  Antibiotics per primary team Symptoms improving gradually  #Hypertension Blood pressure 153/68 today Continue  amlodipine, losartan and metoprolol  #Diabetes mellitus type 2 Lab Results  Component Value Date   HGBA1C 6.5 (H) 04/03/2020   Current antihyperglycemic management include Insulin Aspart and Insulin Glargine   LOS: 12 Mattheo Swindle 12/17/20212:04 PM

## 2020-04-15 NOTE — TOC Progression Note (Signed)
Transition of Care [facility]) - Progression Note    Patient Details  Name: Martha [name] MRN: [id number] Date of Birth: 11/12/1945  Transition of Care [facility]) CM/SW Contact  Eileen Stanford, LCSW Phone Number: 04/15/2020, 2:00 PM  Clinical Narrative:   Pt's daughter states pt does not have a scale at home but states they will get her one after rehab. Pt's daughter understanding that pt should be weighing herself daily. Pt's daughter Lyn Hollingshead for update on [facility] visitation policy. CSW will discuss this with Ricky at [place] and report back to pt's daughter. Expect pt be here through the weekend. Auth started in the case that pt will dc over the weekend.    Expected Discharge Plan: [facility]    Expected Discharge Plan and Services Expected Discharge Plan: [place] arrangements for the past 2 months: Single Family Home                                       Social Determinants of Health (SDOH) Interventions    Readmission Risk Interventions Readmission Risk Prevention Plan 04/04/2020  Transportation Screening Complete  HRI or Home Care Consult Complete  Medication Review (RN Care Manager) Complete  Some recent data might be hidden

## 2020-04-15 NOTE — Progress Notes (Signed)
Inpatient Diabetes Program Recommendations  AACE/ADA: New Consensus Statement on Inpatient Glycemic Control   Target Ranges:  Prepandial:   less than 140 mg/dL      Peak postprandial:   less than 180 mg/dL (1-2 hours)      Critically ill patients:  140 - 180 mg/dL   Results for Martha [name], Martha [name] (MRN [id number]) as of 04/15/2020 11:46  Ref. Range 04/14/2020 07:53 04/14/2020 11:26 04/14/2020 16:09 04/14/2020 19:48 04/14/2020 21:33 04/15/2020 07:24 04/15/2020 11:29  Glucose-Capillary Latest Ref Range: 70 - 99 mg/dL 191 (H) 243 (H) 235 (H) 338 (H) 248 (H) 188 (H) 266 (H)   Review of Glycemic Control  Current orders for Inpatient glycemic control: Lantus 20 units daily, Novolog 0-9 units TID with meals, Novolog 0-5 units QHS; Prednisone 40 mg QAM  Inpatient Diabetes Program Recommendations:    Insulin: If steroids are continued, please consider ordering Novolog 3 units TID with meals for meal coverage if patient eats at least 50% of meals.  Thanks, Barnie Alderman, RN, MSN, CDE Diabetes Coordinator Inpatient Diabetes Program [phone] (Team Pager from 8am to 5pm)

## 2020-04-15 NOTE — Plan of Care (Signed)

## 2020-04-15 NOTE — Progress Notes (Addendum)
Speech Language Pathology Treatment: Dysphagia  Patient Details Name: Martha [name] MRN: [id number] DOB: [birth year] Today's Date: 04/15/2020 Time: 1535-1610 SLP Time Calculation (min) (ACUTE ONLY): 35 min  Assessment / Plan / Recommendation Clinical Impression  Pt seen for ongoing assessment of toleration of diet; dysphagia level 2 w/ thin liquids. Pt continues to present w/ grossly functional oropharyngeal phase swallow but is SIGNIFICANTLY impacted by her overall weakness, declined Pulmonary status, and min declined engagement/awareness(lengthy illness?) during oral intake tasks requiring verbal cues and support for follow through w/ tasks at times. She also presents w/ shaky UEs. She endorsed feelings of N/V -- NSG informed. Husband present during session. The Pulmonary decline and overall weakness/illness impacts her overall awareness/engagement w/ po tasks which can increase risk for aspiration to occur. She endorses reduced desire for oral intake in general. Noted during trials when following general aspiration precautions and when supported w/ setup and feeding support, risk for aspiration can be reduced.   Pt was positioned upright in bed then supported in self-feeding/consuming trials of thin liquids via cup then straw as she prefers. Pt declined trials of foods, purees and minced. No immediate, overt clinical s/s of aspiration noted; no decline in vocal quality, no cough, and no sustained decline in respiratory status during/post trials. Rest Breaks given b/t trials to support respiratory status, WOB. Oral phase was [facility] for bolus management and oral clearing of the boluses given. Sips of thin liquids VIA CUP were encouraged to give better control of boluses vs w/ straw.   Discussed diet consistency of foods/thin liquids; aspiration precautions; pills in Puree; self-feeding support and supervision at meals for follow through w/ aspiration precautions. Education on general aspiration  precautions including Not using Straws IF any increased coughing noted when using them -- recommend sipping from Cup for better bolus control; discussed food options and consistencies w/ a Dys. level 2 (pt is Edentulous and weak). Handouts posted in room, chart. Recommend a Dysphagia level 2(minced) while hospitalized for conservation of energy and d/t Edentulous status, w/ Thin liquids; aspiration precautions; reduce Distractions during meals. Pills in Puree for safer swallowing. Dietician f/u for (drink) supplements to support also. ST services can be available for further education while admitted if needed. NSG updated. Pt/Husband agreed.  NSG requested to go to room post session for pt's c/o N/V. Pt has had a recent h/o such in CCU.     HPI HPI: Pt  is a 74 y.o. female with a medical history significant for Multiple issues including GERD, restless leg syndrome, essential tremors, HTN, DM, COPD, tobacco use, gout, depression/anxiety and chronic lower extremity edema/cellulitis.  Patient presented to the [facility] ED 04/03/20 for leg edema and worsening shortness of breath and was found with O2 saturations in the 80's. She was found to have a BNP of 1113 and imaging showed mild cardiomegaly and pulmonary edema. Her lab work also showed worsening proteinuria. She was admitted for further work up.  per Pulmonary, pt has dx of Acute on chronic hypoxemic respiratory failure; BiPAP at night, on increased [place] O2 support during the day.  Pt has some degree of Cognitive decline - decreased attention and follow through w/ tasks w/out cues; often calling out Loudly.  CXR: Unchanged multifocal patchy airspace opacities within both lungs  which could be due to multifocal pneumonia.      SLP Plan  Continue with current plan of care       Recommendations  Diet recommendations: Dysphagia 2 (fine chop);Thin liquid Liquids provided  via: Cup Medication Administration: Whole meds with puree (recommended d/t overall weakness  currently) Supervision: Patient able to self feed;Staff to assist with self feeding;Intermittent supervision to cue for compensatory strategies Compensations: Minimize environmental distractions;Slow rate;Small sips/bites;Lingual sweep for clearance of pocketing;Multiple dry swallows after each bite/sip;Follow solids with liquid Postural Changes and/or Swallow Maneuvers: Seated upright 90 degrees;Upright 30-60 min after meal;Out of bed for meals                General recommendations:  (Dietician f/u for support) Oral Care Recommendations: Oral care BID;Oral care before and after PO;Staff/trained caregiver to provide oral care Follow up Recommendations:  (TBD) SLP Visit Diagnosis: Dysphagia, oral phase (R13.11) (generalized weakness) Plan: Continue with current plan of care       GO                 Orinda Kenner, [place], [place] Pathologist [facility] [phone] [facility] 04/15/2020, 4:09 PM

## 2020-04-16 LAB — BASIC METABOLIC PANEL
Anion gap: 9 (ref 5–15)
BUN: 27 mg/dL — ABNORMAL HIGH (ref 8–23)
CO2: 33 mmol/L — ABNORMAL HIGH (ref 22–32)
Calcium: 8.6 mg/dL — ABNORMAL LOW (ref 8.9–10.3)
Chloride: 95 mmol/L — ABNORMAL LOW (ref 98–111)
Creatinine, Ser: 0.42 mg/dL — ABNORMAL LOW (ref 0.44–1.00)
GFR, Estimated: >60 mL/min (ref >60)
Glucose, Bld: 109 mg/dL — ABNORMAL HIGH (ref 70–99)
Potassium: 3.9 mmol/L (ref 3.5–5.1)
Sodium: 137 mmol/L (ref 135–145)

## 2020-04-16 LAB — CBC
HCT: 42 % (ref 36.0–46.0)
Hemoglobin: 13.3 g/dL (ref 12.0–15.0)
MCH: 28.4 pg (ref 26.0–34.0)
MCHC: 31.7 g/dL (ref 30.0–36.0)
MCV: 89.6 fL (ref 80.0–100.0)
Platelets: 188 10*3/uL (ref 150–400)
RBC: 4.69 MIL/uL (ref 3.87–5.11)
RDW: 15.9 % — ABNORMAL HIGH (ref 11.5–15.5)
WBC: 10.6 10*3/uL — ABNORMAL HIGH (ref 4.0–10.5)
nRBC: 0 % (ref 0.0–0.2)

## 2020-04-16 LAB — GLUCOSE, CAPILLARY
Glucose-Capillary: 139 mg/dL — ABNORMAL HIGH (ref 70–99)
Glucose-Capillary: 305 mg/dL — ABNORMAL HIGH (ref 70–99)
Glucose-Capillary: 322 mg/dL — ABNORMAL HIGH (ref 70–99)
Glucose-Capillary: 283 mg/dL — ABNORMAL HIGH (ref 70–99)
Glucose-Capillary: 222 mg/dL — ABNORMAL HIGH (ref 70–99)

## 2020-04-16 LAB — FUNGITELL, SERUM: Fungitell Result: <31 pg/mL (ref <80)

## 2020-04-16 LAB — MAGNESIUM: Magnesium: 2.1 mg/dL (ref 1.7–2.4)

## 2020-04-16 MED ORDER — FUROSEMIDE 10 MG/ML IJ SOLN
40.0000 mg | Freq: Once | INTRAMUSCULAR | Status: AC
  Administered 2020-04-16: 09:00:00 40 mg via INTRAVENOUS
  Filled 2020-04-16: qty 4

## 2020-04-16 MED ORDER — CARVEDILOL 6.25 MG PO TABS
6.2500 mg | ORAL_TABLET | Freq: Two times a day (BID) | ORAL | Status: DC
  Administered 2020-04-16: 17:00:00 6.25 mg via ORAL
  Filled 2020-04-16: qty 1

## 2020-04-16 NOTE — Progress Notes (Signed)
PROGRESS NOTE    Martha [name]   MWN:[id number]  DOB: [birth year]  PCP: Martha Harrier, MD    DOA: 04/03/2020 LOS: 13   Brief Narrative   From H&P: "Martha [name] is a 74 y.o. female with medical history significant of  HTN, HLD, DM, COPD, tobacco abuse, hypothyroidism, gout, depression, tremor, proteinuria, depression, anxiety, GERD, diverticulitis, who presents with bilateral leg edema and pain, and SOB.   Patient states that she has chronic bilateral leg edema which has been progressively worsened recently. Both lower legs are painful and erythematous. She states that she gets her lower extremities wrapped once a week.  She has chronic cough and shortness of breath which has slightly worsened on exertion.  She coughs up some white mucus.  No fever or chills.  No chest pain.  On presentation she was hypoxic requiring BiPAP, later transitioned to 4 L of oxygen. Patient also has an history of proteinuria which is being worked up by nephrology as an outpatient and they are planning for biopsy.   Patient had pertinent labs positive for BNP of 1113, troponin and COVID-19 negative.  Chest x-ray with borderline cardiomegaly and mild pulmonary edema.  Significant edema of bilateral lower extremities with signs of venous congestion, skin peeling and few shallow ulcers.  Cardiology was consulted."  Pt required Bipap again and close monitoring in step down unit on 12/11.  Was resumed on antibiotics for suspected aspiration pneumonia and steroids.     Assessment & Plan   Principal Problem:   Acute CHF (congestive heart failure) (HCC) Active Problems:   COPD exacerbation (HCC)   Diabetes mellitus without complication ([place])   Current tobacco use   Hyperlipidemia   Essential hypertension   Hypothyroidism   Gout   Depression   Tremors of nervous system   Acute respiratory failure with hypoxia (HCC)   Cellulitis of lower extremity   Acute pulmonary edema (HCC)   Elevated brain  natriuretic peptide (BNP) level   Nephrotic syndrome  Acute respiratory failure with hypoxia and hypercapnia secondary to combination of  Acute on chronic diastolic CHF,  COPD exacerbation,  Right lower lobe pneumonia due to Aspiration - Patient was aggressively diuresed earlier this admission.   CT chest on 12/8 obtained for hemoptysis, showed atelectasis versus multifocal pneumonia.   Covid was negative on admission.  CTA later negative for PE.   Unlikely ACS as troponins very mildly elevated and flat. 12/16-17 -weaned to 7 L/min. 12/18 -weaned to 5 L/min --BNP over 1300 on 12/17, chest x-ray " suspect combination of interstitial pulmonary edema and underlying fibrosis.  No consolidation.  Small pleural effusions bilaterally.  Cardiomegaly with pulmonary vascular congestion.  Suspect underlying CHF".  Had been off diuresis for at least a few days. --Gave IV Lasix 40 mg once yesterday afternoon, repeat dose this morning, discussed with cardiology --Supplemental O2 as needed, maintain O2 sat greater than 88%, wean as tolerated --ID and pulmonology following --Completed course of Unasyn --Continue prednisone 40 mg daily (steroids per pulm) --Follow blood cultures, negative to date --Qualification for home BiPAP is underway --Metoprolol was changed to Coreg for better BP control  Dysphagia -12/14 nursing reported patient gagging and choking with medications.  Concern for aspiration pneumonia.  SLP consulted for swallow eval.  Currently on dysphagia 2 diet (edentulous), tolerating.  Acute metabolic encephalopathy -improved after BiPAP.  Patient is awake, lucid and appropriately responding to questions.  Likely multifactorial with COPD, pneumonia, uremia urinary retention contributing. 12/16 - per daughter,  pt improved but not near her baseline yet --Continue management of acute issues as outlined --Delirium precautions:     -Lights and TV off, minimize interruptions at night    -Blinds  open and lights on during day    -Glasses/hearing aid with patient    -Frequent reorientation    -PT/OT when able    -Avoid sedation medications/[place] list medications --Monitor  Hypertension -continue losartan, amlodipine, metoprolol was changed to Coreg for better BP control  Abdominal pain /diarrhea - diarrhea has resolved.  No need to check C. difficile, can DC precautions. --Monitor abdominal pain, diet resumed per SLP dysphagia 2  Hyperbilirubinemia -elevated indirect bili, normal LFTs.  Peripheral smear, LDH, CBC without evidence of hemolysis.  RUQ ultrasound unrevealing.  Suspect due to acute illnesses.  Urinary retention -Foley was placed  Hemoptysis -resolved.  Patient had small volume blood-tinged sputum.  Suspect secondary to coughing episodes.  CTA chest showed atelectasis versus infiltrates and was negative for PE.  Nephrotic syndrome with proteinuria - Plan for kidney biopsy once recovered from acute illness.  Previously on Lasix infusion this admission. --Nephrology following --Currently off diuretic  Bilateral lower extremity venous stasis ulcers -with bilateral Unna boots in place.  Wound care consulted.  Was treated with 5 days Rocephin/Keflex.  Continue compression therapy  Bacteriuria -patient without UTI symptoms (was unable to provide history at the time), UA was positive for infection and culture grew pansensitive E. coli.  She completed 5 days of Rocephin and Keflex.  Hypokalemia -replaced.  Monitor BMP and replace as needed  Type 2 diabetes -A1c 6.5%.  Takes Metformin and glipizide at home.  Continue sliding scale NovoLog and basal insulin.  Tobacco abuse -patient is ongoing heavy smoker.  Nicotine patch ordered.  Essential hypertension -patient's losartan was increased to 100 mg.  She was started on amlodipine.  Cardiology also added metoprolol.  Labetalol IV as needed.  Hypothyroidism -continue Synthroid  Hyperlipidemia -continue Crestor  History of  gout -no acute issues  History of depression -takes Zoloft which was held due to mental status changes.  Resume Zoloft tomorrow, 12/16.  History of tremors -Home primidone has been held due to mental status changes.  Resume primidone this evening, 12/15.  Chronic pain - home oxycodone is held, was weaned off gabapentin earlier this admission due to encephalopathy.   Patient BMI: Body mass index is 26 kg/m.   DVT prophylaxis: enoxaparin (LOVENOX) injection 40 mg Start: 04/14/20 2200   Diet:  Diet Orders (From admission, onward)    Start     Ordered   04/13/20 1205  DIET DYS 2 Room service appropriate? Yes with Assist; Fluid consistency: Thin  Diet effective now       Comments: Please add extra Gravy on meats, potatoes. Yogurt at breakfast/lunch meals. Puddings, jello.  Question Answer Comment  Room service appropriate? Yes with Assist   Fluid consistency: Thin      04/13/20 1205            Code Status: Full Code    Subjective 04/16/20    Patient seen this morning, daughter at bedside.  Patient again is very sleepy but more arousable today than yesterday.  She had a coughing spell during encounter after daughter gave her a sip of water by straw.  Patient denies any other acute complaints at this time.   Disposition Plan & Communication   Status is: Inpatient  Remains inpatient appropriate because:IV treatments appropriate due to intensity of illness or inability to take  PO   Dispo: The patient is from: Home              Anticipated d/c is to: SNF              Anticipated d/c date is: 2-3 days              Patient currently is not medically stable to d/c.   Family Communication: Daughter was at bedside on rounds today   Consults, Procedures, Significant Events   Consultants:   Nephrology  Cardiology  Infectious disease  Pulmonology  Procedures:   None  Antimicrobials:  Anti-infectives (From admission, onward)   Start     Dose/Rate Route Frequency  Ordered Stop   04/13/20 0330  vancomycin (VANCOREADY) IVPB 500 mg/100 mL  Status:  Discontinued        500 mg 100 mL/hr over 60 Minutes Intravenous Every 12 hours 04/12/20 1458 04/12/20 1715   04/12/20 1815  ampicillin-sulbactam (UNASYN) 1.5 g in sodium chloride 0.9 % 100 mL IVPB  Status:  Discontinued        1.5 g 200 mL/hr over 30 Minutes Intravenous Every 6 hours 04/12/20 1715 04/15/20 1520   04/12/20 1515  vancomycin (VANCOREADY) IVPB 1500 mg/300 mL  Status:  Discontinued        1,500 mg 150 mL/hr over 120 Minutes Intravenous  Once 04/12/20 1428 04/12/20 1715   04/08/20 2200  vancomycin (VANCOREADY) IVPB 750 mg/150 mL  Status:  Discontinued        750 mg 150 mL/hr over 60 Minutes Intravenous Every 12 hours 04/08/20 1005 04/08/20 1318   04/08/20 1100  vancomycin (VANCOREADY) IVPB 1750 mg/350 mL  Status:  Discontinued        1,750 mg 175 mL/hr over 120 Minutes Intravenous  Once 04/08/20 1005 04/08/20 1318   04/08/20 1100  azithromycin (ZITHROMAX) tablet 500 mg        500 mg Oral Daily 04/08/20 1005 04/11/20 0959   04/08/20 1015  piperacillin-tazobactam (ZOSYN) IVPB 3.375 g  Status:  Discontinued        3.375 g 12.5 mL/hr over 240 Minutes Intravenous Every 8 hours 04/08/20 1005 04/12/20 1715   04/07/20 0600  cephALEXin (KEFLEX) capsule 500 mg        500 mg Oral Every 8 hours 04/06/20 1146 04/07/20 2121   04/05/20 1000  cefTRIAXone (ROCEPHIN) 2 g in sodium chloride 0.9 % 100 mL IVPB  Status:  Discontinued        2 g 200 mL/hr over 30 Minutes Intravenous Every 24 hours 04/04/20 1525 04/06/20 1146   04/04/20 1100  cefTRIAXone (ROCEPHIN) 2 g in sodium chloride 0.9 % 100 mL IVPB  Status:  Discontinued        2 g 200 mL/hr over 30 Minutes Intravenous Every 24 hours 04/04/20 1023 04/04/20 1510   04/03/20 1500  cefTRIAXone (ROCEPHIN) 1 g in sodium chloride 0.9 % 100 mL IVPB  Status:  Discontinued        1 g 200 mL/hr over 30 Minutes Intravenous Every 24 hours 04/03/20 1445 04/04/20 1023          Objective   Vitals:   04/16/20 0448 04/16/20 0718 04/16/20 0741 04/16/20 1156  BP: (!) 165/69  (!) 154/50 (!) 151/56  Pulse: (!) 56  64 61  Resp: 18  18 18   Temp: 97.7 F (36.5 C)  98.7 F (37.1 C) (!) 97.5 F (36.4 C)  TempSrc: Oral  Oral Oral  SpO2: 97% 95% 93%  94%  Weight: 68.7 kg     Height:        Intake/Output Summary (Last 24 hours) at 04/16/2020 1542 Last data filed at 04/16/2020 1427 Gross per 24 hour  Intake 1500 ml  Output 3050 ml  Net -1550 ml   Filed Weights   04/15/20 0332 04/15/20 0439 04/16/20 0448  Weight: 68.7 kg 68.2 kg 68.7 kg    Physical Exam:  General exam: lethargic but easily arouses, no acute distress Respiratory system: CTAB with diminished bases, normal respiratory effort, on 7 L/min HFNC. Cardiovascular system: normal S1/S2, RRR Central nervous system: no gross focal neurologic deficits, intention tremor noted Extremities: Unna boots on bilateral lower extremities, distal sensation intact, b/l upper extremities with stable ecchymosis   Labs   Data Reviewed: I have personally reviewed following labs and imaging studies  CBC: Recent Labs  Lab 04/12/20 0623 04/13/20 0441 04/14/20 0530 04/15/20 0417 04/16/20 0440  WBC 16.2* 16.6* 14.1* 14.0* 10.6*  HGB 15.7* 14.7 14.9 14.3 13.3  HCT 51.5* 48.3* 47.3* 45.6 42.0  MCV 91.8 92.0 90.6 91.0 89.6  PLT 179 186 188 180 630   Basic Metabolic Panel: Recent Labs  Lab 04/12/20 0623 04/13/20 0441 04/14/20 0530 04/15/20 0417 04/16/20 0440  NA 142 141 145 142 137  K 3.1* 4.1 4.3 4.4 3.9  CL 84* 95* 99 101 95*  CO2 44* 35* 35* 31 33*  GLUCOSE 328* 206* 173* 98 109*  BUN 37* 30* 29* 26* 27*  CREATININE 0.55 0.55 0.47 0.50 0.42*  CALCIUM 9.3 8.9 9.2 9.2 8.6*  MG 2.2 2.0 2.3 2.2 2.1   GFR: Estimated Creatinine Clearance: 57.8 mL/min (A) (by C-G formula based on SCr of 0.42 mg/dL (L)). Liver Function Tests: Recent Labs  Lab 04/11/20 0428 04/12/20 0623 04/13/20 0441  04/14/20 0530 04/15/20 0417  AST 19 25 18  35 27  ALT 16 29 27  41 44  ALKPHOS 74 84 77 118 150*  BILITOT 2.1* 2.2* 1.6* 1.3* 1.2  PROT 7.0 6.6 6.3* 6.3* 6.0*  ALBUMIN 4.4 4.1 3.8 3.8 3.5   No results for input(s): LIPASE, AMYLASE in the last 168 hours. No results for input(s): AMMONIA in the last 168 hours. Coagulation Profile: Recent Labs  Lab 04/10/20 1040  INR 1.1   Cardiac Enzymes: No results for input(s): CKTOTAL, CKMB, CKMBINDEX, TROPONINI in the last 168 hours. BNP (last 3 results) No results for input(s): PROBNP in the last 8760 hours. HbA1C: No results for input(s): HGBA1C in the last 72 hours. CBG: Recent Labs  Lab 04/15/20 1129 04/15/20 1618 04/15/20 2035 04/16/20 0739 04/16/20 1157  GLUCAP 266* 389* 241* 139* 305*   Lipid Profile: No results for input(s): CHOL, HDL, LDLCALC, TRIG, CHOLHDL, LDLDIRECT in the last 72 hours. Thyroid Function Tests: No results for input(s): TSH, T4TOTAL, FREET4, T3FREE, THYROIDAB in the last 72 hours. Anemia Panel: No results for input(s): VITAMINB12, FOLATE, FERRITIN, TIBC, IRON, RETICCTPCT in the last 72 hours. Sepsis Labs: Recent Labs  Lab 04/12/20 1601  PROCALCITON <0.10    Recent Results (from the past 240 hour(s))  Urine Culture     Status: None   Collection Time: 04/08/20  9:52 AM   Specimen: Urine, Random  Result Value Ref Range Status   Specimen Description   Final    URINE, RANDOM Performed at [facility], [address], [place], [place] [address]    Special Requests   Final    NONE Performed at [facility], [place],  [place], [place] [address]    Culture   Final    NO GROWTH Performed at [facility], [place] [address], [place], [place] [address]    Report Status 04/09/2020 FINAL  Final  CULTURE, BLOOD (ROUTINE X 2) w Reflex to ID Panel     Status: None   Collection Time: 04/08/20 10:02 AM   Specimen: BLOOD  Result Value Ref Range Status   Specimen Description BLOOD  BLOOD RIGHT HAND  Final   Special Requests   Final    BOTTLES DRAWN AEROBIC AND ANAEROBIC Blood Culture results may not be optimal due to an inadequate volume of blood received in culture bottles   Culture   Final    NO GROWTH 5 DAYS Performed at [facility], [address] [place], [place] [address]    Report Status 04/13/2020 FINAL  Final  MRSA PCR Screening     Status: None   Collection Time: 04/08/20 10:04 AM   Specimen: Nasal Mucosa; Nasopharyngeal  Result Value Ref Range Status   MRSA by PCR NEGATIVE NEGATIVE Final    Comment:        The GeneXpert MRSA Assay (FDA approved for NASAL specimens only), is one component of a comprehensive MRSA colonization surveillance program. It is not intended to diagnose MRSA infection nor to guide or monitor treatment for MRSA infections. Performed at [facility] [address] [place], [place] [address]   CULTURE, BLOOD (ROUTINE X 2) w Reflex to ID Panel     Status: None   Collection Time: 04/08/20 10:26 AM   Specimen: BLOOD  Result Value Ref Range Status   Specimen Description BLOOD BLOOD RIGHT HAND  Final   Special Requests   Final    BOTTLES DRAWN AEROBIC AND ANAEROBIC Blood Culture results may not be optimal due to an inadequate volume of blood received in culture bottles   Culture   Final    NO GROWTH 5 DAYS Performed at [facility], [address] [place], [place] [address]    Report Status 04/13/2020 FINAL  Final  Urine Culture     Status: None   Collection Time: 04/10/20  6:21 PM   Specimen: Urine, Random  Result Value Ref Range Status   Specimen Description   Final    URINE, RANDOM Performed at [facility], [address], [place], [place] [address]    Special Requests   Final    NONE Performed at [facility], [address], [place], [place] [address]    Culture   Final    NO GROWTH Performed at [facility], [place] [address] [place], [place]  [address]    Report Status 04/11/2020 FINAL  Final  MRSA PCR Screening     Status: None   Collection Time: 04/12/20  3:25 PM   Specimen: Nasopharyngeal  Result Value Ref Range Status   MRSA by PCR NEGATIVE NEGATIVE Final    Comment:        The GeneXpert MRSA Assay (FDA approved for NASAL specimens only), is one component of a comprehensive MRSA colonization surveillance program. It is not intended to diagnose MRSA infection nor to guide or monitor treatment for MRSA infections. Performed at [facility], [address], [place], [place] [address]       Imaging Studies   DG Chest [place] 1 View  Result Date: 04/15/2020 CLINICAL DATA:  Shortness of breath with soft tissue edema EXAM: PORTABLE CHEST 1 VIEW COMPARISON:  Chest radiograph April 09, 2020 and chest CT April 12, 2020 FINDINGS: There is diffuse interstitial prominence, likely representing a combination of fibrosis and interstitial pulmonary edema. There is atelectatic change in the left mid lung. There are small pleural effusions bilaterally. No consolidation. There is cardiomegaly with a degree of pulmonary venous hypertension. There is aortic atherosclerosis. No appreciable adenopathy. Foci of calcification noted in each carotid artery. Calcification noted in the lateral left shoulder, stable. IMPRESSION: Suspect combination of interstitial pulmonary edema and underlying fibrosis. No consolidation. Small pleural effusions bilaterally. Cardiomegaly with pulmonary vascular congestion. Suspect a degree of underlying congestive heart failure. Aortic Atherosclerosis (ICD10-I70.0). Electronically Signed   By: Lowella Grip III M.D.   On: 04/15/2020 09:01     Medications   Scheduled Meds: . amLODipine  10 mg Oral Daily  . vitamin C  1,000 mg Oral Daily  . carvedilol  6.25 mg Oral BID WC  . chlorhexidine  15 mL Mouth Rinse BID  . Chlorhexidine Gluconate Cloth  6 each Topical Daily  . enoxaparin (LOVENOX)  injection  40 mg Subcutaneous Q24H  . feeding supplement (GLUCERNA SHAKE)  237 mL Oral TID BM  . fluticasone  1 spray Each Nare BID  . insulin aspart  0-5 Units Subcutaneous QHS  . insulin aspart  0-9 Units Subcutaneous TID WC  . insulin glargine  20 Units Subcutaneous Daily  . ipratropium-albuterol  3 mL Nebulization TID  . levothyroxine  150 mcg Oral Q0600  . losartan  100 mg Oral Daily  . mouth rinse  15 mL Mouth Rinse q12n4p  . multivitamin with minerals  1 tablet Oral Daily  . pantoprazole  40 mg Oral Daily  . predniSONE  40 mg Oral Q breakfast  . primidone  50 mg Oral BID  . rosuvastatin  5 mg Oral Daily  . sertraline  50 mg Oral Daily  . sodium chloride flush  3 mL Intravenous Q12H  . vitamin B-12  1,000 mcg Oral Daily   Continuous Infusions: . sodium chloride 250 mL (04/15/20 1256)       LOS: 13 days    Time spent: 25 minutes with greater than 50% spent at bedside and in coordination of care    Ezekiel Slocumb, DO [facility]  04/16/2020, 3:42 PM    If 7PM-7AM, please contact night-coverage. How to contact the [facility] Attending or Consulting provider [place] or covering provider during after hours [place], for this patient?    1. Check the care team in [facility] and look for a) attending/consulting TR[facility] provider listed and b) the [facility] team listed 2. Log into [email or web address] and use [place] universal password to access. If you do not have the password, please contact the hospital operator. 3. Locate the [facility] provider you are looking for under [facility] and page to a number that you can be directly reached. 4. If you still have difficulty reaching the provider, please page the [facility] (Director on Call) for the Hospitalists listed on amion for assistance.

## 2020-04-16 NOTE — Progress Notes (Signed)
Patient Name: Martha Martha [name] Date of Encounter: 04/16/2020  Hospital Problem List     Principal Problem:   Acute CHF (congestive heart failure) (HCC) Active Problems:   COPD exacerbation (HCC)   Diabetes mellitus without complication (Martha [name])   Current tobacco use   Hyperlipidemia   Essential hypertension   Hypothyroidism   Gout   Depression   Tremors of nervous system   Acute respiratory failure with hypoxia (HCC)   Cellulitis of lower extremity   Acute pulmonary edema (HCC)   Elevated brain natriuretic peptide (BNP) level   Nephrotic syndrome    Patient Profile     74 year old with history of hypertension, hyperlipidemia, diabetes, COPD, smoking ,thyroid disease, depression, tremor, and GERD presents with bilateral leg edema swelling redness pain.  Patient admitted with cellulitis or lower extremities. She was having wound care done to her legs at home. She was  seen by vascular  in the past. She presented with severe hypoxemia COPD on BiPAP. She has a long history of smoking . She improved with diuresis and treatment of her reactive airway disease. She has required resumption of bipap several days ago and was placed back on steroids and abx for possible aspiration pna. BNP was 1371 yesterday . Was 3607 7 days ago and 1113 on admission. Renal function has remained stable. CXR showed cardiomegaly with pulmonary vascular congestion and possible chf.   Subjective   Eating breakfast with nasal cannula this am.   Inpatient Medications    . amLODipine  10 mg Oral Daily  . vitamin C  1,000 mg Oral Daily  . chlorhexidine  15 mL Mouth Rinse BID  . Chlorhexidine Gluconate Cloth  6 each Topical Daily  . enoxaparin (LOVENOX) injection  40 mg Subcutaneous Q24H  . feeding supplement (GLUCERNA SHAKE)  237 mL Oral TID BM  . fluticasone  1 spray Each Nare BID  . furosemide  40 mg Intravenous Once  . insulin aspart  0-5 Units Subcutaneous QHS  . insulin aspart  0-9 Units Subcutaneous TID  WC  . insulin glargine  20 Units Subcutaneous Daily  . ipratropium-albuterol  3 mL Nebulization TID  . levothyroxine  150 mcg Oral Q0600  . losartan  100 mg Oral Daily  . mouth rinse  15 mL Mouth Rinse q12n4p  . metoprolol tartrate  50 mg Oral BID  . multivitamin with minerals  1 tablet Oral Daily  . pantoprazole  40 mg Oral Daily  . predniSONE  40 mg Oral Q breakfast  . primidone  50 mg Oral BID  . rosuvastatin  5 mg Oral Daily  . sertraline  50 mg Oral Daily  . sodium chloride flush  3 mL Intravenous Q12H  . vitamin B-12  1,000 mcg Oral Daily    Vital Signs    Vitals:   04/15/20 2034 04/16/20 0448 04/16/20 0718 04/16/20 0741  BP: (!) 173/66 (!) 165/69  (!) 154/50  Pulse: 65 (!) 56  64  Resp: 18 18  18   Temp: 97.8 F (36.6 C) 97.7 F (36.5 C)  98.7 F (37.1 C)  TempSrc: Oral Oral  Oral  SpO2: 94% 97% 95%   Weight:  68.7 kg    Height:        Intake/Output Summary (Last 24 hours) at 04/16/2020 0834 Last data filed at 04/16/2020 0550 Gross per 24 hour  Intake 1260 ml  Output 2525 ml  Net -1265 ml   Filed Weights   04/15/20 0332 04/15/20 0439 04/16/20 0448  Weight: 68.7 kg 68.2 kg 68.7 kg    Physical Exam    WJX:BJYNWG in no acute distress.  HEENT: normal.  Neck: Supple, no JVD, carotid bruits, or masses. Cardiac: RRR, no murmurs, rubs, or gallops. 2+ pitting edema in lower extremities  Respiratory:  Decrease breath sounds. No wheezes or rales.  GI: Soft, nontender, nondistended, BS + x 4. MS: no deformity or atrophy. Skin: warm and dry, no rash. Neuro:  Martha [name] and sensation are intact. Psych: Normal affect.  Labs    CBC Recent Labs    04/15/20 0417 04/16/20 0440  WBC 14.0* 10.6*  HGB 14.3 13.3  HCT 45.6 42.0  MCV 91.0 89.6  PLT 180 956   Basic Metabolic Panel Recent Labs    04/15/20 0417 04/16/20 0440  NA 142 137  K 4.4 3.9  CL 101 95*  CO2 31 33*  GLUCOSE 98 109*  BUN 26* 27*  CREATININE 0.50 0.42*  CALCIUM 9.2 8.6*  MG 2.2 2.1    Liver Function Tests Recent Labs    04/14/20 0530 04/15/20 0417  AST 35 27  ALT 41 44  ALKPHOS 118 150*  BILITOT 1.3* 1.2  PROT 6.3* 6.0*  ALBUMIN 3.8 3.5   No results for input(s): LIPASE, AMYLASE in the last 72 hours. Cardiac Enzymes No results for input(s): CKTOTAL, CKMB, CKMBINDEX, TROPONINI in the last 72 hours. BNP Recent Labs    04/15/20 0417  BNP 1,371.4*   D-Dimer No results for input(s): DDIMER in the last 72 hours. Hemoglobin A1C No results for input(s): HGBA1C in the last 72 hours. Fasting Lipid Panel No results for input(s): CHOL, HDL, LDLCALC, TRIG, CHOLHDL, LDLDIRECT in the last 72 hours. Thyroid Function Tests No results for input(s): TSH, T4TOTAL, T3FREE, THYROIDAB in the last 72 hours.  Invalid input(s): FREET3  Telemetry    nsr  ECG    nsr with no ischemia  Radiology    DG Chest 1 View  Result Date: 04/09/2020 CLINICAL DATA:  Acute respiratory failure EXAM: CHEST  1 VIEW COMPARISON:  04/03/2016 FINDINGS: Normal cardiac silhouette. Interval increase in interstitial edema pattern. No focal consolidation. No pneumothorax. IMPRESSION: Worsening interstitial edema. Electronically Signed   By: Suzy Bouchard M.D.   On: 04/09/2020 06:31   DG Chest 2 View  Result Date: 04/03/2020 CLINICAL DATA:  Hypoxia. Leg edema and shortness of breath. Pt daughter states that pt is supposed to be getting a renal bx but they are not able to do it because of the edema in pts legs. Pt states that she does feel short of breath. Pt does not wear oxygen at home. Pt placed on O2 in triage due to sats being in the 80s. Hx of asthma, chronic bronchitis, COPD, thyroid cancer- 2009, DM, current smoker. EXAM: CHEST - 2 VIEW COMPARISON:  01/04/2018 FINDINGS: Cardiac silhouette is borderline enlarged. Stable aortic atherosclerotic calcifications. No mediastinal or hilar masses. No evidence of adenopathy. Lungs demonstrate diffuse bilateral irregular interstitial thickening which  has increased compared to the prior study. There is also linear/reticular scarring in the left lung base, which is stable. No lung consolidation. No pleural effusion and no pneumothorax. Skeletal structures are demineralized, but grossly intact. IMPRESSION: 1. Bilateral irregular interstitial thickening, increased compared to the prior study, along with borderline cardiomegaly. Suspect mild congestive heart failure. Consider diffuse interstitial infection or inflammation in the proper clinical setting. No evidence of lobar pneumonia. Electronically Signed   By: Lajean Manes M.D.   On: 04/03/2020 13:06  CT CHEST W CONTRAST  Result Date: 04/06/2020 CLINICAL DATA:  Hemoptysis. EXAM: CT CHEST WITH CONTRAST TECHNIQUE: Multidetector CT imaging of the chest was performed during intravenous contrast administration. CONTRAST:  23mL OMNIPAQUE IOHEXOL 300 MG/ML  SOLN COMPARISON:  January 06, 2019 FINDINGS: Cardiovascular: There is marked severity calcification of the aortic arch. There is mild cardiomegaly. No pericardial effusion. Mediastinum/Nodes: There is mild pretracheal and right hilar lymphadenopathy. Thyroid gland, trachea, and esophagus demonstrate no significant findings. Lungs/Pleura: Mild-to-moderate severity emphysematous lung disease is seen involving the bilateral upper lobes with stable bullous disease is seen along the medial aspect of the left apex. Mild to moderate severity areas of atelectasis and/or infiltrate are seen within the posterior aspects of the bilateral upper lobes and left lung base. Moderate to marked severity posterior right basilar atelectasis and/or infiltrate is noted. A very small right pleural effusion is seen. No pneumothorax is identified. Upper Abdomen: There is stable hepatosplenomegaly with stable, moderate severity diffuse bilateral adrenal gland enlargement. Musculoskeletal: A chronic lateral eighth left rib fracture is seen. A chronic compression fracture deformity of the  T12 vertebral body is noted. IMPRESSION: 1. Mild-to-moderate severity posterior bilateral upper lobe and left basilar atelectasis and/or infiltrate. 2. Moderate to marked severity posterior right basilar atelectasis and/or infiltrate. 3. Very small right pleural effusion. 4. Mild cardiomegaly. 5. Stable emphysematous lung disease. 6. Chronic lateral eighth left rib fracture. 7. Chronic compression fracture deformity of the T12 vertebral body. 8. Aortic atherosclerosis. Aortic Atherosclerosis (ICD10-I70.0) and Emphysema (ICD10-J43.9). Electronically Signed   By: Virgina Norfolk M.D.   On: 04/06/2020 15:33   CT ANGIO CHEST PE W OR WO CONTRAST  Result Date: 04/07/2020 CLINICAL DATA:  Shortness of breath EXAM: CT ANGIOGRAPHY CHEST WITH CONTRAST TECHNIQUE: Multidetector CT imaging of the chest was performed using the standard protocol during bolus administration of intravenous contrast. Multiplanar CT image reconstructions and MIPs were obtained to evaluate the vascular anatomy. CONTRAST:  94mL OMNIPAQUE IOHEXOL 350 MG/ML SOLN COMPARISON:  April 06, 2020 FINDINGS: Cardiovascular: There is a optimal opacification of the pulmonary arteries. There is no central,segmental, or subsegmental filling defects within the pulmonary arteries. There is unchanged cardiomegaly. Mitral valve calcifications and coronary artery calcifications are seen. No pericardial effusion or thickening. No evidence right heart strain. There is normal three-vessel brachiocephalic anatomy without proximal stenosis. Scattered aortic atherosclerosis is noted. Mediastinum/Nodes: No hilar, mediastinal, or axillary adenopathy. Thyroid gland, trachea, and esophagus demonstrate no significant findings. Lungs/Pleura: Patchy airspace opacity seen at the left upper lung and posterior right lung base with air bronchograms. There are trace bilateral pleural effusions present. Centrilobular and paraseptal emphysematous changes seen at both lung apices. Upper  Abdomen: No acute abnormalities present in the visualized portions of the upper abdomen. Musculoskeletal: No chest wall abnormality. No acute or significant osseous findings. There is a chronic anterior wedge compression deformity of the T12 vertebral body with 50% loss in vertebral body height. Review of the MIP images confirms the above findings. IMPRESSION: No central, segmental, or subsegmental pulmonary embolism. Unchanged multifocal patchy airspace opacities within both lungs which could be due to multifocal pneumonia Trace bilateral pleural effusions, right greater than left. Aortic Atherosclerosis (ICD10-I70.0). Electronically Signed   By: Prudencio Pair M.D.   On: 04/07/2020 19:43   CT CHEST ABDOMEN PELVIS W CONTRAST  Result Date: 04/12/2020 CLINICAL DATA:  Shortness of breath, abdominal pain, and bilateral leg swelling. EXAM: CT CHEST, ABDOMEN, AND PELVIS WITH CONTRAST TECHNIQUE: Multidetector CT imaging of the chest, abdomen and pelvis was  performed following the standard protocol during bolus administration of intravenous contrast. CONTRAST:  180mL OMNIPAQUE IOHEXOL 300 MG/ML  SOLN COMPARISON:  Right upper quadrant ultrasound from yesterday. CTA chest dated April 07, 2020. FINDINGS: CT CHEST FINDINGS Cardiovascular: Unchanged borderline cardiomegaly. No pericardial effusion. No thoracic aortic aneurysm or dissection. Coronary, aortic arch, and branch vessel atherosclerotic vascular disease. 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 No central pulmonary embolism. Mediastinum/Nodes: Unchanged mildly enlarged precarinal lymph node measuring 12 mm in short axis, stable since September 2020, likely reactive. No enlarged hilar or axillary lymph nodes. Prior thyroidectomy. Prominent circumferential wall thickening of the proximal and mid esophagus, slightly worsened since the prior study. Lungs/Pleura: Slightly  increased small right pleural effusion. New trace left pleural effusion. Prominent atelectasis within the right lower lobe with patchy areas of non enhancement and air bronchograms consistent with underlying pneumonia, slightly worsened when compared to prior study. Increased patchy consolidation in both posterior upper lobes. Minimal subsegmental atelectasis in the left lower lobe. Moderate centrilobular and mild paraseptal emphysema again noted with bullous changes in the medial left lung apex. No pneumothorax. 7 mm subpleural nodule in the left upper lobe (series 4, image 39), similar to most recent chest CTs, but slightly more conspicuous when compared to CT chest from September 2020. Musculoskeletal: No acute or significant osseous findings. Unchanged chronic T12 compression deformity. Old left eighth rib fracture. CT ABDOMEN PELVIS FINDINGS Hepatobiliary: Slightly lobular liver contour. No focal liver abnormality is seen. No gallstones, gallbladder wall thickening, or biliary dilatation. Pancreas: Unremarkable. No pancreatic ductal dilatation or surrounding inflammatory changes. Spleen: Normal in size without focal abnormality. Adrenals/Urinary Tract: Chronic bilateral adrenal hypertrophy. No renal calculi, focal lesion, or hydronephrosis. The bladder is unremarkable. Stomach/Bowel: Stomach is within normal limits. Appendix appears normal. No evidence of bowel wall thickening, distention, or inflammatory changes. Prominent sigmoid colonic diverticulosis. Vascular/Lymphatic: Aortic atherosclerosis. 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 No enlarged abdominal or pelvic lymph nodes. Reproductive: Uterus and bilateral adnexa are unremarkable. Other: Scattered trace ascites. No pneumoperitoneum. Small fat containing paraumbilical hernias. Musculoskeletal: No acute or significant osseous findings. IMPRESSION: CT chest: 1. Mildly worsened multifocal pneumonia. 2. Slightly increased small right  pleural effusion. New trace left pleural effusion. 3. 7 mm subpleural nodule in the left upper lobe, similar to most recent chest CTs, but slightly more conspicuous when compared to CT chest from September 2020. While this may be infectious or inflammatory, attention on follow-up imaging is recommended. 4. Prominent circumferential wall thickening of the proximal and mid esophagus, slightly worsened since the prior study, concerning for esophagitis. 5. 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 6. Aortic Atherosclerosis (ICD10-I70.0) and Emphysema (ICD10-J43.9). CT abdomen and pelvis: 1. No acute intra-abdominal process. 2. Slightly lobular liver contour with scattered trace ascites, suggestive of cirrhosis. 3. 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 Electronically Signed   By: Titus Dubin M.D.   On: 04/12/2020 13:51   US Venous Img Lower Bilateral (DVT)  Result Date: 04/03/2020 CLINICAL DATA:  Lower leg cellulitis EXAM: BILATERAL LOWER EXTREMITY VENOUS DOPPLER ULTRASOUND TECHNIQUE: Gray-scale sonography with graded compression, as well as color Doppler and duplex ultrasound were performed to evaluate the lower extremity deep venous systems from the level of the common femoral vein and including the common femoral, femoral, profunda femoral, popliteal and calf veins including the posterior tibial, peroneal and gastrocnemius veins  when visible. The superficial great saphenous vein was also interrogated. Spectral Doppler was utilized to evaluate flow at rest and with distal augmentation maneuvers in the common femoral, femoral and popliteal veins. COMPARISON:  None. FINDINGS: RIGHT LOWER EXTREMITY Common Femoral Vein: No evidence of thrombus. Normal compressibility, respiratory phasicity and response to augmentation. Saphenofemoral Junction: No evidence of thrombus. Normal compressibility and flow on color  Doppler imaging. Profunda Femoral Vein: No evidence of thrombus. Normal compressibility and flow on color Doppler imaging. Femoral Vein: No evidence of thrombus. Normal compressibility, respiratory phasicity and response to augmentation. Popliteal Vein: Not well visualized due to overlying edema. Calf Veins: Not well visualized due to overlying edema. Superficial Great Saphenous Vein: No evidence of thrombus. Normal compressibility. Venous Reflux:  None. Other Findings:  None. LEFT LOWER EXTREMITY Common Femoral Vein: No evidence of thrombus. Normal compressibility, respiratory phasicity and response to augmentation. Saphenofemoral Junction: No evidence of thrombus. Normal compressibility and flow on color Doppler imaging. Profunda Femoral Vein: No evidence of thrombus. Normal compressibility and flow on color Doppler imaging. Femoral Vein: No evidence of thrombus. Normal compressibility, respiratory phasicity and response to augmentation. Popliteal Vein: Not well visualized due to overlying edema. Calf Veins: Not well visualized due to overlying edema. Superficial Great Saphenous Vein: No evidence of thrombus. Normal compressibility. Venous Reflux:  None. Other Findings:  None. IMPRESSION: Somewhat limited exam due to peripheral edema although no central deep venous thrombosis is noted. Electronically Signed   By: Inez Catalina M.D.   On: 04/03/2020 15:58   DG Chest Port 1 View  Result Date: 04/15/2020 CLINICAL DATA:  Shortness of breath with soft tissue edema EXAM: PORTABLE CHEST 1 VIEW COMPARISON:  Chest radiograph April 09, 2020 and chest CT April 12, 2020 FINDINGS: There is diffuse interstitial prominence, likely representing a combination of fibrosis and interstitial pulmonary edema. There is atelectatic change in the left mid lung. There are small pleural effusions bilaterally. No consolidation. There is cardiomegaly with a degree of pulmonary venous hypertension. There is aortic atherosclerosis. No  appreciable adenopathy. Foci of calcification noted in each carotid artery. Calcification noted in the lateral left shoulder, stable. IMPRESSION: Suspect combination of interstitial pulmonary edema and underlying fibrosis. No consolidation. Small pleural effusions bilaterally. Cardiomegaly with pulmonary vascular congestion. Suspect a degree of underlying congestive heart failure. Aortic Atherosclerosis (ICD10-I70.0). Electronically Signed   By: Lowella Grip III M.D.   On: 04/15/2020 09:01   ECHOCARDIOGRAM COMPLETE  Result Date: 04/04/2020    ECHOCARDIOGRAM REPORT   Patient Name:   Martha STRENG Date of Exam: 04/04/2020 Medical Rec #:  [id number]      Height:       64.0 in Accession #:    [id number]     Weight:       181.8 lb Date of Birth:  Aug 10, 1945     BSA:          1.879 m Patient Age:    70 years       BP:           171/78 mmHg Patient Gender: F              HR:           98 bpm. Exam Location:  [facility] Procedure: 2D Echo, Color Doppler, Cardiac Doppler and Intracardiac            Opacification Agent Indications:     I50.31 CHF-Acute Diastolic  History:         Patient has  no prior history of Echocardiogram examinations.                  COPD, Signs/Symptoms:Shortness of Breath; Risk Factors:Diabetes                  and Current Smoker.  Sonographer:     Charmayne Sheer RDCS (AE) Referring Phys:  Baker Janus Soledad Gerlach NIU Diagnosing Phys: Bartholome Bill MD  Sonographer Comments: Technically difficult study due to poor echo windows. Image acquisition challenging due to patient body habitus and Image acquisition challenging due to COPD. IMPRESSIONS  1. Left ventricular ejection fraction, by estimation, is 55 to 60%. The left ventricle has normal function. The left ventricle has no regional wall motion abnormalities. Left ventricular diastolic parameters are consistent with Grade I diastolic dysfunction (impaired relaxation).  2. Right ventricular systolic function is normal. The right ventricular size is mildly enlarged.   3. Left atrial size was mildly dilated.  4. Right atrial size was mildly dilated.  5. The mitral valve was not well visualized. Trivial mitral valve regurgitation.  6. The aortic valve was not well visualized. Aortic valve regurgitation is trivial. FINDINGS  Left Ventricle: Left ventricular ejection fraction, by estimation, is 55 to 60%. The left ventricle has normal function. The left ventricle has no regional wall motion abnormalities. Definity contrast agent was given IV to delineate the left ventricular  endocardial borders. The left ventricular internal cavity size was normal in size. There is borderline left ventricular hypertrophy. Left ventricular diastolic parameters are consistent with Grade I diastolic dysfunction (impaired relaxation). Right Ventricle: The right ventricular size is mildly enlarged. No increase in right ventricular wall thickness. Right ventricular systolic function is normal. Left Atrium: Left atrial size was mildly dilated. Right Atrium: Right atrial size was mildly dilated. Pericardium: There is no evidence of pericardial effusion. Mitral Valve: The mitral valve was not well visualized. Trivial mitral valve regurgitation. MV peak gradient, 7.4 mmHg. The mean mitral valve gradient is 3.0 mmHg. Tricuspid Valve: The tricuspid valve is not well visualized. Tricuspid valve regurgitation is trivial. Aortic Valve: The aortic valve was not well visualized. Aortic valve regurgitation is trivial. Aortic valve mean gradient measures 3.0 mmHg. Aortic valve peak gradient measures 6.8 mmHg. Aortic valve area, by VTI measures 2.46 cm. Pulmonic Valve: The pulmonic valve was not well visualized. Pulmonic valve regurgitation is not visualized. Aorta: The aortic root was not well visualized. IAS/Shunts: The interatrial septum was not assessed.  LEFT VENTRICLE PLAX 2D LVIDd:         4.15 cm     Diastology LVIDs:         3.64 cm     LV e' medial:    5.98 cm/s LV PW:         1.43 cm     LV E/e' medial:   18.3 LV IVS:        1.12 cm     LV e' lateral:   6.53 cm/s LVOT diam:     2.00 cm     LV E/e' lateral: 16.8 LV SV:         55 LV SV Index:   29 LVOT Area:     3.14 cm  LV Volumes (MOD) LV vol d, MOD A2C: 55.5 ml LV vol d, MOD A4C: 63.8 ml LV vol s, MOD A2C: 34.3 ml LV vol s, MOD A4C: 23.4 ml LV SV MOD A2C:     21.2 ml LV SV MOD A4C:     63.8  ml LV SV MOD BP:      34.2 ml RIGHT VENTRICLE RV Basal diam:  3.76 cm LEFT ATRIUM             Index       RIGHT ATRIUM           Index LA diam:        4.90 cm 2.61 cm/m  RA Area:     19.80 cm LA Vol (A2C):   59.5 ml 31.67 ml/m RA Volume:   61.10 ml  32.52 ml/m LA Vol (A4C):   57.5 ml 30.61 ml/m LA Biplane Vol: 61.2 ml 32.58 ml/m  AORTIC VALVE                   PULMONIC VALVE AV Area (Vmax):    2.51 cm    PV Vmax:       0.76 m/s AV Area (Vmean):   2.43 cm    PV Vmean:      53.200 cm/s AV Area (VTI):     2.46 cm    PV VTI:        0.126 m AV Vmax:           130.00 cm/s PV Peak grad:  2.3 mmHg AV Vmean:          83.500 cm/s PV Mean grad:  1.0 mmHg AV VTI:            0.225 m AV Peak Grad:      6.8 mmHg AV Mean Grad:      3.0 mmHg LVOT Vmax:         104.00 cm/s LVOT Vmean:        64.700 cm/s LVOT VTI:          0.176 m LVOT/AV VTI ratio: 0.78  AORTA Ao Root diam: 3.10 cm MITRAL VALVE                TRICUSPID VALVE MV Area (PHT): 3.00 cm     TR Peak grad:   28.9 mmHg MV Peak grad:  7.4 mmHg     TR Vmax:        269.00 cm/s MV Mean grad:  3.0 mmHg MV Vmax:       1.36 m/s     SHUNTS MV Vmean:      86.9 cm/s    Systemic VTI:  0.18 m MV Decel Time: 253 msec     Systemic Diam: 2.00 cm MV E velocity: 109.50 cm/s MV A velocity: 137.50 cm/s MV E/A ratio:  0.80 Bartholome Bill MD Electronically signed by Bartholome Bill MD Signature Date/Time: 04/04/2020/4:54:02 PM    Final    US Abdomen Limited RUQ (LIVER/GB)  Result Date: 04/11/2020 CLINICAL DATA:  Hyperbilirubinemia in a 74 year old female EXAM: ULTRASOUND ABDOMEN LIMITED RIGHT UPPER QUADRANT COMPARISON:  April 07, 2020 CT  angiography of the chest and CT of the chest of April 06, 2020 FINDINGS: Gallbladder: Wall thickness at upper limits of normal. No visible pericholecystic fluid or evidence of cholelithiasis. No reported tenderness over the gallbladder. Common bile duct: Diameter: 2.2 mm Liver: Mildly heterogeneous echotexture with nodular contour and suggestion of fissural widening particularly on image 22. No visible lesion on submitted images. Portal vein is patent on color Doppler imaging with normal direction of blood flow towards the liver. Other: Trace ascites IMPRESSION: 1. Heterogeneous, mildly heterogeneous hepatic echotexture with question of lobular contour and fissural widening, findings could be seen in the setting of early liver disease.  2. Gallbladder wall thickness at upper limits of normal could be seen in the setting of liver disease and is nonspecific, not associated with biliary calculi, reported tenderness or biliary duct dilation. Electronically Signed   By: Zetta Bills M.D.   On: 04/11/2020 12:23    Assessment & Plan    Principal Problem:   Acute CHF (congestive heart failure) (HCC) Active Problems:   COPD exacerbation (HCC)   Diabetes mellitus without complication ([place])   Current tobacco use   Hyperlipidemia   Essential hypertension   Hypothyroidism   Gout   Depression   Tremors of nervous system   Acute respiratory failure with hypoxia (HCC)   Cellulitis of lower extremity   Acute pulmonary edema (HCC)   Elevated brain natriuretic peptide (BNP) level   Nephrotic syndrome    1.Acute on chronic diastolic congestive heart failure,presenting with shortness of breath and bilateral lower extremity edema,LVEF 55 to 60% by 2D echocardiogram, previously on Lasix drip. Has been off of diuretics for several days. Received iv lasix yesterday and this am. She diuresed over a liter in the past day. She is - 15 liters since admission. BNP up but improved over earlier in the admission.  Will recheck in am. She is on metoprolol tartrate 50 bid and losartan 100 daily, amlodipine 10 mg daily with blood pressure control that is still not optimal. I think it is reasonable to try changing to carvedilol from metoprolol.   Will follow blood pressure response to this. Will also need to follow for evidence of increased bronchospasm with this change.    2.Acute on chronic hypoxic respiratory failure, multifactorial, secondary to CHF, and right lower lobe pneumonia, followed by pulmonary,O2 by nasal cannula. Continue with steroids and abx. Follow for bronchospasm with change to carvedilol.   3.Encephalopathy, improved over the course of the admission.  4.Nephrotic syndrome, followed by nephrology  5.Essential hypertension,on amlodipine, metoprolol tartrate, and losartan,blood pressure remainsmildlyelevated. Will try changing to carvedilol as per above.   6. COPD exacerbation with tobacco use history  7. Diarrhea and abdominal pain, now followed by infectious disease  Recommendations: 1. Recommend following symptoms and electrolytes as well as bnp with iv diuiresis the past two days. WIll likely need oral lasix.  2. Change to carvedilol 6.25 bid initially  3. Defer further cardiac diagnostics at this time. 4. Follow-up with Dr. Saralyn Pilar 1-2 weeks after discharge when stable.   Signed, Javier Docker Bowie Doiron MD 04/16/2020, 8:34 AM  Pager: [phone]

## 2020-04-16 NOTE — Progress Notes (Signed)
Pulmonary and Critical Care  Medicine          Date: 04/16/2020,   MRN# [id number] Martha [name] 1945-08-11     AdmissionWeight: 72 kg                 CurrentWeight: 68.7 kg  Referring physician: Dr. Si Raider    CHIEF COMPLAINT:   Acute on chronic hypoxemic respiratory failure   HISTORY OF PRESENT ILLNESS   74 year old female with history of diabetes COPD lifelong smoking hypothyroidism gout major depressive disorder, anxiety disorder and GERD, diverticulitis who came in with signs and symptoms of anasarca including peripheral edema and pulmonary edema.  Patient states is been worse than usual despite compliance with wrapping the lower extremities.  She also reported coughing and dyspnea.  Upon presentation to the emergency room she was found to be desaturating in the mid 80s and was placed on 4 L supplemental oxygen with some subsequent improvement of SPO2.  She was then treated with BiPAP and felt better.  She was initially admitted with acute exacerbation of CHF and cardiology was consulted for this.  She was also found to have proteinuria with CKD and had renal consultation placed. Pulmonary consultation placed for acute on chronic hypoxemic respiratory failure with failure to wean down oxygen.  Patient did have CT PE protocol done to rule out acute pulmonary venous embolism which was negative however did show bullous emphysema with overlying pulmonary edema as well as a right lower lobe consolidated infiltrate suggestive of pneumonia as well as right hemidiaphragm elevation due to hepatomegaly.  04/09/20- patient seen at bedside, remains encephalopathic.  Discussed with [facility] attending Dr Si Raider plan for transfer to SDU.     04/10/20- patient is improved.  She is more awake, she was able to speak with me despite BIPAP mask, she has normal 4/4 grip bilaterally and moves LE to verbal communication without encouragment. Mentation has significantly improved. She is flushed red on  examination.    04/11/20- patient is improved.  She is off BIPAP during my evaluation.  She is able to speak and reported right upper extermity pain, this may be related to K infusion. I dicussed this with RN who has already addressed this with IV team. ABG is in progress. Cardiology and nephrology following, UOP is low.   04/13/20- patient is improved.  She is speaking in full sentences. I spoke to her common law husband to review hospital course and care plan.  Patient will need BIPAP for home, ive discussed case with [facility] team and qualification for NIV is in process.  Spirometry is in process. 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  She smokes actively , I have provided smoking cessation counseling today and will be following up on outpatient for cessation therapy.  LE swelling is resolved and sensorium is close to baseline. There is no diarreah  04/14/20- patient resting in bed. Mildly confused. Breathing is diminished bilaterllly , on 8L HF bubbler. ABG tonight.   04/15/20- patient is resting in bed. Vitals are stable.  Seems patient may have been more confused after Haldol but not from ventilation as her ABG is without hypercapnia.  Nocturnal pulse oximetry is pending for home BIPAP  04/16/20- Daughter present during my evaluation.  She shares patient was smoking 2.5 packs daily and recently has been trying to stop. We discussed BIPAP for home.  Reviewed nocturnal pulse oximetry today with mild desaturation overnight. Patient is down to 5L/min [place] and improved.  PAST MEDICAL HISTORY   Past Medical History:  Diagnosis Date  . Abnormal LFTs (liver function tests)   . Anxiety   . Arthritis    knees, feet  . Asthma   . Chronic bronchitis ([place])   . Chronic constipation   . Chronic low back pain   . Cigarette smoker    Has cut back Smoking to 1 pack every other day  . COPD (chronic obstructive pulmonary disease) ([place])   . Depression   . Diabetic neuropathy ([place])   .  Diverticulitis 2015   gi recommended repeat scope in 10 years  . Essential hypertension   . Fatty liver   . GERD (gastroesophageal reflux disease)   . Gout   . History of mammogram 05/28/2013  . Hyperlipidemia   . Hypothyroidism   . Insomnia   . Iron deficiency anemia   . Low serum vitamin D   . Personal history of tobacco use, presenting hazards to health 09/29/2015  . Plantar fasciitis   . RLS (restless legs syndrome)   . Shortness of breath dyspnea   . Thyroid cancer ([place]) 2009  . Tremors of nervous system    "I think I have parkinson's disease"  . Type 2 diabetes mellitus ([place])   . Wears dentures    uppers     SURGICAL HISTORY   Past Surgical History:  Procedure Laterality Date  . CATARACT EXTRACTION W/PHACO Left 11/24/2015   Procedure: CATARACT EXTRACTION PHACO AND INTRAOCULAR LENS PLACEMENT (IOC);  Surgeon: Birder Robson, MD;  Location: [facility] ORS;  Service: Ophthalmology;  Laterality: Left;  [place] 38.3 AP% 20.6 CDE 7.89 FLUID PACK LOT # [id number]  . CATARACT EXTRACTION W/PHACO Right 06/23/2019   Procedure: CATARACT EXTRACTION PHACO AND INTRAOCULAR LENS PLACEMENT ([place]) RIGHT DIABETIC;  Surgeon: Birder Robson, MD;  Location: [facility];  Service: Ophthalmology;  Laterality: Right;  Diabetic  . COLONOSCOPY  2004, 2009, 2015   [place]  . POLYPECTOMY  2009  . THYROIDECTOMY  2009   states she was tx with radiactive iodine  . TUBAL LIGATION    . UPPER GASTROINTESTINAL ENDOSCOPY       FAMILY HISTORY   Family History  Problem Relation Age of Onset  . Diabetes Mellitus II Father   . Lung disease Father   . Diabetes Mellitus II Mother   . Arthritis Mother   . Thyroid disease Other   . Asthma Sister   . Emphysema Sister   . Hypertension Sister   . Breast cancer Paternal Aunt      SOCIAL HISTORY   Social History   Tobacco Use  . Smoking status: Current Every Day Smoker    Packs/day: 1.00    Years: 39.50    Pack years: 39.50    Types: Cigarettes   . Smokeless tobacco: Never Used  . Tobacco comment: curently smokes 12 cigerattes a day. I'm trying to cut back.  Vaping Use  . Vaping Use: Never used  Substance Use Topics  . Alcohol use: No    Alcohol/week: 0.0 standard drinks  . Drug use: No     MEDICATIONS    Home Medication:    Current Medication:  Current Facility-Administered Medications:  .  0.9 %  sodium chloride infusion, 250 mL, Intravenous, PRN, Ivor Costa, MD, Last Rate: 5 mL/hr at 04/15/20 1256, 250 mL at 04/15/20 1256 .  acetaminophen (TYLENOL) suppository 650 mg, 650 mg, Rectal, Q6H PRN, Lang Snow, NP, 650 mg at 04/08/20 2234 .  acetaminophen (TYLENOL) tablet  650 mg, 650 mg, Oral, Q6H PRN, Lorella Nimrod, MD, 650 mg at 04/15/20 2212 .  albuterol (PROVENTIL) (2.5 MG/3ML) 0.083% nebulizer solution 2.5 mg, 2.5 mg, Nebulization, Q4H PRN, Ivor Costa, MD .  amLODipine (NORVASC) tablet 10 mg, 10 mg, Oral, Daily, Wouk, Ailene Rud, MD, 10 mg at 04/16/20 0914 .  ascorbic acid (VITAMIN C) tablet 1,000 mg, 1,000 mg, Oral, Daily, Ivor Costa, MD, 1,000 mg at 04/16/20 0913 .  calcium carbonate (TUMS - dosed in mg elemental calcium) chewable tablet 200 mg of elemental calcium, 1 tablet, Oral, Q8H PRN, Nicole Kindred A, DO, 200 mg of elemental calcium at 04/14/20 1824 .  carvedilol (COREG) tablet 6.25 mg, 6.25 mg, Oral, BID WC, Fath, Kenneth A, MD .  chlorhexidine (PERIDEX) 0.12 % solution 15 mL, 15 mL, Mouth Rinse, BID, Wouk, Ailene Rud, MD, 15 mL at 04/16/20 0913 .  Chlorhexidine Gluconate Cloth 2 % PADS 6 each, 6 each, Topical, Daily, Wouk, Ailene Rud, MD, 6 each at 04/16/20 0915 .  dextromethorphan-guaiFENesin (MUCINEX DM) 30-600 MG per 12 hr tablet 1 tablet, 1 tablet, Oral, BID PRN, Ivor Costa, MD, 1 tablet at 04/06/20 0945 .  enoxaparin (LOVENOX) injection 40 mg, 40 mg, Subcutaneous, Q24H, Nicole Kindred A, DO, 40 mg at 04/15/20 2211 .  feeding supplement (GLUCERNA SHAKE) (GLUCERNA SHAKE) liquid 237 mL, 237  mL, Oral, TID BM, Nicole Kindred A, DO, 237 mL at 04/16/20 0915 .  fluticasone (FLONASE) 50 MCG/ACT nasal spray 1 spray, 1 spray, Each Nare, BID, Sharion Settler, NP, 1 spray at 04/16/20 0915 .  fluticasone (FLONASE) 50 MCG/ACT nasal spray 1-2 spray, 1-2 spray, Each Nare, Daily PRN, Ivor Costa, MD .  insulin aspart (novoLOG) injection 0-5 Units, 0-5 Units, Subcutaneous, QHS, Ivor Costa, MD, 2 Units at 04/15/20 2211 .  insulin aspart (novoLOG) injection 0-9 Units, 0-9 Units, Subcutaneous, TID WC, Ivor Costa, MD, 1 Units at 04/16/20 0914 .  insulin glargine (LANTUS) injection 20 Units, 20 Units, Subcutaneous, Daily, Wouk, Ailene Rud, MD, 20 Units at 04/16/20 0915 .  ipratropium-albuterol (DUONEB) 0.5-2.5 (3) MG/3ML nebulizer solution 3 mL, 3 mL, Nebulization, TID, Ivor Costa, MD, 3 mL at 04/16/20 0718 .  labetalol (NORMODYNE) injection 20 mg, 20 mg, Intravenous, Q2H PRN, Wouk, Ailene Rud, MD, 20 mg at 04/15/20 1252 .  levothyroxine (SYNTHROID) tablet 150 mcg, 150 mcg, Oral, Q0600, Ivor Costa, MD, 150 mcg at 04/16/20 0659 .  losartan (COZAAR) tablet 100 mg, 100 mg, Oral, Daily, Wouk, Ailene Rud, MD, 100 mg at 04/16/20 0913 .  MEDLINE mouth rinse, 15 mL, Mouth Rinse, q12n4p, Wouk, Ailene Rud, MD, 15 mL at 04/15/20 1600 .  multivitamin with minerals tablet 1 tablet, 1 tablet, Oral, Daily, Ezekiel Slocumb, DO, 1 tablet at 04/16/20 0913 .  nitroGLYCERIN (NITROSTAT) SL tablet 0.4 mg, 0.4 mg, Sublingual, Q5 min PRN, Wouk, Ailene Rud, MD, 0.4 mg at 04/08/20 0659 .  ondansetron (ZOFRAN) injection 4 mg, 4 mg, Intravenous, Q8H PRN, Ivor Costa, MD, 4 mg at 04/15/20 1100 .  pantoprazole (PROTONIX) EC tablet 40 mg, 40 mg, Oral, Daily, Nicole Kindred A, DO, 40 mg at 04/16/20 0914 .  predniSONE (DELTASONE) tablet 40 mg, 40 mg, Oral, Q breakfast, Lakeysha Slutsky, MD, 40 mg at 04/16/20 0913 .  primidone (MYSOLINE) tablet 50 mg, 50 mg, Oral, BID, Nicole Kindred A, DO, 50 mg at 04/16/20 0914 .   prochlorperazine (COMPAZINE) injection 10 mg, 10 mg, Intravenous, Q6H PRN, Nicole Kindred A, DO, 10 mg at 04/15/20 1729 .  rosuvastatin (  CRESTOR) tablet 5 mg, 5 mg, Oral, Daily, Ivor Costa, MD, 5 mg at 04/16/20 0914 .  sertraline (ZOLOFT) tablet 50 mg, 50 mg, Oral, Daily, Nicole Kindred A, DO, 50 mg at 04/16/20 0914 .  sodium chloride flush (NS) 0.9 % injection 3 mL, 3 mL, Intravenous, Q12H, Ivor Costa, MD, 3 mL at 04/16/20 0916 .  sodium chloride flush (NS) 0.9 % injection 3 mL, 3 mL, Intravenous, PRN, Ivor Costa, MD .  vitamin B-12 (CYANOCOBALAMIN) tablet 1,000 mcg, 1,000 mcg, Oral, Daily, Ivor Costa, MD, 1,000 mcg at 04/16/20 0914    ALLERGIES   Nicotine polacrilex, Nicotrol [nicotine], and Codeine sulfate     REVIEW OF SYSTEMS    Review of Systems:  Unable to obtain due to encephalopathy   VS: BP (!) 151/56 (BP Location: Right Arm)   Pulse 61   Temp (!) 97.5 F (36.4 C) (Oral)   Resp 18   Ht _0  (1.626 m)   Wt 68.7 kg   SpO2 94%   BMI 26.00 kg/m      PHYSICAL EXAM    GENERAL:NAD, no fevers, chills, no weakness no fatigue HEAD: Normocephalic, atraumatic.  EYES: Pupils equal, round, reactive to light. Extraocular muscles intact. No scleral icterus.  MOUTH: dry mucosal membrane. Dentition intact. No abscess noted. On BIPAP EAR, NOSE, THROAT: Clear without exudates. No external lesions.  NECK: Supple. No thyromegaly. No nodules. No JVD.  PULMONARY: mild rhonchi bilaterally  CARDIOVASCULAR: S1 and S2. Regular rate and rhythm. No murmurs, rubs, or gallops. No edema. Pedal pulses 2+ bilaterally.  GASTROINTESTINAL: Soft, nontender, nondistended. No masses. Positive bowel sounds. No hepatosplenomegaly.  MUSCULOSKELETAL: No swelling, clubbing, +edema 2++.  NEUROLOGIC: GCS 10  SKIN: No ulceration, lesions, rashes, or cyanosis. Skin warm and dry. Turgor intact.       IMAGING    DG Chest 1 View  Result Date: 04/09/2020 CLINICAL DATA:  Acute respiratory failure  EXAM: CHEST  1 VIEW COMPARISON:  04/03/2016 FINDINGS: Normal cardiac silhouette. Interval increase in interstitial edema pattern. No focal consolidation. No pneumothorax. IMPRESSION: Worsening interstitial edema. Electronically Signed   By: Suzy Bouchard M.D.   On: 04/09/2020 06:31   DG Chest 2 View  Result Date: 04/03/2020 CLINICAL DATA:  Hypoxia. Leg edema and shortness of breath. Pt daughter states that pt is supposed to be getting a renal bx but they are not able to do it because of the edema in pts legs. Pt states that she does feel short of breath. Pt does not wear oxygen at home. Pt placed on O2 in triage due to sats being in the 80s. Hx of asthma, chronic bronchitis, COPD, thyroid cancer- 2009, DM, current smoker. EXAM: CHEST - 2 VIEW COMPARISON:  01/04/2018 FINDINGS: Cardiac silhouette is borderline enlarged. Stable aortic atherosclerotic calcifications. No mediastinal or hilar masses. No evidence of adenopathy. Lungs demonstrate diffuse bilateral irregular interstitial thickening which has increased compared to the prior study. There is also linear/reticular scarring in the left lung base, which is stable. No lung consolidation. No pleural effusion and no pneumothorax. Skeletal structures are demineralized, but grossly intact. IMPRESSION: 1. Bilateral irregular interstitial thickening, increased compared to the prior study, along with borderline cardiomegaly. Suspect mild congestive heart failure. Consider diffuse interstitial infection or inflammation in the proper clinical setting. No evidence of lobar pneumonia. Electronically Signed   By: Lajean Manes M.D.   On: 04/03/2020 13:06   CT CHEST W CONTRAST  Result Date: 04/06/2020 CLINICAL DATA:  Hemoptysis. EXAM: CT CHEST WITH  CONTRAST TECHNIQUE: Multidetector CT imaging of the chest was performed during intravenous contrast administration. CONTRAST:  71m OMNIPAQUE IOHEXOL 300 MG/ML  SOLN COMPARISON:  January 06, 2019 FINDINGS: Cardiovascular:  There is marked severity calcification of the aortic arch. There is mild cardiomegaly. No pericardial effusion. Mediastinum/Nodes: There is mild pretracheal and right hilar lymphadenopathy. Thyroid gland, trachea, and esophagus demonstrate no significant findings. Lungs/Pleura: Mild-to-moderate severity emphysematous lung disease is seen involving the bilateral upper lobes with stable bullous disease is seen along the medial aspect of the left apex. Mild to moderate severity areas of atelectasis and/or infiltrate are seen within the posterior aspects of the bilateral upper lobes and left lung base. Moderate to marked severity posterior right basilar atelectasis and/or infiltrate is noted. A very small right pleural effusion is seen. No pneumothorax is identified. Upper Abdomen: There is stable hepatosplenomegaly with stable, moderate severity diffuse bilateral adrenal gland enlargement. Musculoskeletal: A chronic lateral eighth left rib fracture is seen. A chronic compression fracture deformity of the T12 vertebral body is noted. IMPRESSION: 1. Mild-to-moderate severity posterior bilateral upper lobe and left basilar atelectasis and/or infiltrate. 2. Moderate to marked severity posterior right basilar atelectasis and/or infiltrate. 3. Very small right pleural effusion. 4. Mild cardiomegaly. 5. Stable emphysematous lung disease. 6. Chronic lateral eighth left rib fracture. 7. Chronic compression fracture deformity of the T12 vertebral body. 8. Aortic atherosclerosis. Aortic Atherosclerosis (ICD10-I70.0) and Emphysema (ICD10-J43.9). Electronically Signed   By: TVirgina NorfolkM.D.   On: 04/06/2020 15:33   CT ANGIO CHEST PE W OR WO CONTRAST  Result Date: 04/07/2020 CLINICAL DATA:  Shortness of breath EXAM: CT ANGIOGRAPHY CHEST WITH CONTRAST TECHNIQUE: Multidetector CT imaging of the chest was performed using the standard protocol during bolus administration of intravenous contrast. Multiplanar CT image  reconstructions and MIPs were obtained to evaluate the vascular anatomy. CONTRAST:  786mOMNIPAQUE IOHEXOL 350 MG/ML SOLN COMPARISON:  April 06, 2020 FINDINGS: Cardiovascular: There is a optimal opacification of the pulmonary arteries. There is no central,segmental, or subsegmental filling defects within the pulmonary arteries. There is unchanged cardiomegaly. Mitral valve calcifications and coronary artery calcifications are seen. No pericardial effusion or thickening. No evidence right heart strain. There is normal three-vessel brachiocephalic anatomy without proximal stenosis. Scattered aortic atherosclerosis is noted. Mediastinum/Nodes: No hilar, mediastinal, or axillary adenopathy. Thyroid gland, trachea, and esophagus demonstrate no significant findings. Lungs/Pleura: Patchy airspace opacity seen at the left upper lung and posterior right lung base with air bronchograms. There are trace bilateral pleural effusions present. Centrilobular and paraseptal emphysematous changes seen at both lung apices. Upper Abdomen: No acute abnormalities present in the visualized portions of the upper abdomen. Musculoskeletal: No chest wall abnormality. No acute or significant osseous findings. There is a chronic anterior wedge compression deformity of the T12 vertebral body with 50% loss in vertebral body height. Review of the MIP images confirms the above findings. IMPRESSION: No central, segmental, or subsegmental pulmonary embolism. Unchanged multifocal patchy airspace opacities within both lungs which could be due to multifocal pneumonia Trace bilateral pleural effusions, right greater than left. Aortic Atherosclerosis (ICD10-I70.0). Electronically Signed   By: BiPrudencio Pair.D.   On: 04/07/2020 19:43   CT CHEST ABDOMEN PELVIS W CONTRAST  Result Date: 04/12/2020 CLINICAL DATA:  Shortness of breath, abdominal pain, and bilateral leg swelling. EXAM: CT CHEST, ABDOMEN, AND PELVIS WITH CONTRAST TECHNIQUE: Multidetector  CT imaging of the chest, abdomen and pelvis was performed following the standard protocol during bolus administration of intravenous contrast. CONTRAST:  1009mMNIPAQUE  IOHEXOL 300 MG/ML  SOLN COMPARISON:  Right upper quadrant ultrasound from yesterday. CTA chest dated April 07, 2020. FINDINGS: CT CHEST FINDINGS Cardiovascular: Unchanged borderline cardiomegaly. No pericardial effusion. No thoracic aortic aneurysm or dissection. Coronary, aortic arch, and branch vessel atherosclerotic vascular disease. 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 No central pulmonary embolism. Mediastinum/Nodes: Unchanged mildly enlarged precarinal lymph node measuring 12 mm in short axis, stable since September 2020, likely reactive. No enlarged hilar or axillary lymph nodes. Prior thyroidectomy. Prominent circumferential wall thickening of the proximal and mid esophagus, slightly worsened since the prior study. Lungs/Pleura: Slightly increased small right pleural effusion. New trace left pleural effusion. Prominent atelectasis within the right lower lobe with patchy areas of non enhancement and air bronchograms consistent with underlying pneumonia, slightly worsened when compared to prior study. Increased patchy consolidation in both posterior upper lobes. Minimal subsegmental atelectasis in the left lower lobe. Moderate centrilobular and mild paraseptal emphysema again noted with bullous changes in the medial left lung apex. No pneumothorax. 7 mm subpleural nodule in the left upper lobe (series 4, image 39), similar to most recent chest CTs, but slightly more conspicuous when compared to CT chest from September 2020. Musculoskeletal: No acute or significant osseous findings. Unchanged chronic T12 compression deformity. Old left eighth rib fracture. CT ABDOMEN PELVIS FINDINGS Hepatobiliary: Slightly lobular liver contour. No focal liver  abnormality is seen. No gallstones, gallbladder wall thickening, or biliary dilatation. Pancreas: Unremarkable. No pancreatic ductal dilatation or surrounding inflammatory changes. Spleen: Normal in size without focal abnormality. Adrenals/Urinary Tract: Chronic bilateral adrenal hypertrophy. No renal calculi, focal lesion, or hydronephrosis. The bladder is unremarkable. Stomach/Bowel: Stomach is within normal limits. Appendix appears normal. No evidence of bowel wall thickening, distention, or inflammatory changes. Prominent sigmoid colonic diverticulosis. Vascular/Lymphatic: Aortic atherosclerosis. 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 No enlarged abdominal or pelvic lymph nodes. Reproductive: Uterus and bilateral adnexa are unremarkable. Other: Scattered trace ascites. No pneumoperitoneum. Small fat containing paraumbilical hernias. Musculoskeletal: No acute or significant osseous findings. IMPRESSION: CT chest: 1. Mildly worsened multifocal pneumonia. 2. Slightly increased small right pleural effusion. New trace left pleural effusion. 3. 7 mm subpleural nodule in the left upper lobe, similar to most recent chest CTs, but slightly more conspicuous when compared to CT chest from September 2020. While this may be infectious or inflammatory, attention on follow-up imaging is recommended. 4. Prominent circumferential wall thickening of the proximal and mid esophagus, slightly worsened since the prior study, concerning for esophagitis. 5. 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 6. Aortic Atherosclerosis (ICD10-I70.0) and Emphysema (ICD10-J43.9). CT abdomen and pelvis: 1. No acute intra-abdominal process. 2. Slightly lobular liver contour with scattered trace ascites, suggestive of cirrhosis. 3. 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 Electronically Signed   By: Titus Dubin M.D.   On:  04/12/2020 13:51   US Venous Img Lower Bilateral (DVT)  Result Date: 04/03/2020 CLINICAL DATA:  Lower leg cellulitis EXAM: BILATERAL LOWER EXTREMITY VENOUS DOPPLER ULTRASOUND TECHNIQUE: Gray-scale sonography with graded compression, as well as color Doppler and duplex ultrasound were performed to evaluate the lower extremity deep venous systems from the level of the common femoral vein and including the common femoral, femoral, profunda femoral, popliteal and calf veins including the posterior tibial, peroneal and gastrocnemius veins when visible. The superficial great saphenous vein was also interrogated. Spectral Doppler was utilized to  evaluate flow at rest and with distal augmentation maneuvers in the common femoral, femoral and popliteal veins. COMPARISON:  None. FINDINGS: RIGHT LOWER EXTREMITY Common Femoral Vein: No evidence of thrombus. Normal compressibility, respiratory phasicity and response to augmentation. Saphenofemoral Junction: No evidence of thrombus. Normal compressibility and flow on color Doppler imaging. Profunda Femoral Vein: No evidence of thrombus. Normal compressibility and flow on color Doppler imaging. Femoral Vein: No evidence of thrombus. Normal compressibility, respiratory phasicity and response to augmentation. Popliteal Vein: Not well visualized due to overlying edema. Calf Veins: Not well visualized due to overlying edema. Superficial Great Saphenous Vein: No evidence of thrombus. Normal compressibility. Venous Reflux:  None. Other Findings:  None. LEFT LOWER EXTREMITY Common Femoral Vein: No evidence of thrombus. Normal compressibility, respiratory phasicity and response to augmentation. Saphenofemoral Junction: No evidence of thrombus. Normal compressibility and flow on color Doppler imaging. Profunda Femoral Vein: No evidence of thrombus. Normal compressibility and flow on color Doppler imaging. Femoral Vein: No evidence of thrombus. Normal compressibility, respiratory  phasicity and response to augmentation. Popliteal Vein: Not well visualized due to overlying edema. Calf Veins: Not well visualized due to overlying edema. Superficial Great Saphenous Vein: No evidence of thrombus. Normal compressibility. Venous Reflux:  None. Other Findings:  None. IMPRESSION: Somewhat limited exam due to peripheral edema although no central deep venous thrombosis is noted. Electronically Signed   By: Inez Catalina M.D.   On: 04/03/2020 15:58   DG Chest Port 1 View  Result Date: 04/15/2020 CLINICAL DATA:  Shortness of breath with soft tissue edema EXAM: PORTABLE CHEST 1 VIEW COMPARISON:  Chest radiograph April 09, 2020 and chest CT April 12, 2020 FINDINGS: There is diffuse interstitial prominence, likely representing a combination of fibrosis and interstitial pulmonary edema. There is atelectatic change in the left mid lung. There are small pleural effusions bilaterally. No consolidation. There is cardiomegaly with a degree of pulmonary venous hypertension. There is aortic atherosclerosis. No appreciable adenopathy. Foci of calcification noted in each carotid artery. Calcification noted in the lateral left shoulder, stable. IMPRESSION: Suspect combination of interstitial pulmonary edema and underlying fibrosis. No consolidation. Small pleural effusions bilaterally. Cardiomegaly with pulmonary vascular congestion. Suspect a degree of underlying congestive heart failure. Aortic Atherosclerosis (ICD10-I70.0). Electronically Signed   By: Lowella Grip III M.D.   On: 04/15/2020 09:01   ECHOCARDIOGRAM COMPLETE  Result Date: 04/04/2020    ECHOCARDIOGRAM REPORT   Patient Name:   Martha [name] Date of Exam: 04/04/2020 Medical Rec #:  [id number]      Height:       64.0 in Accession #:    [id number]     Weight:       181.8 lb Date of Birth:  27-Jul-1945     BSA:          1.879 m Patient Age:    56 years       BP:           171/78 mmHg Patient Gender: F              HR:           98 bpm. Exam  Location:  [facility] Procedure: 2D Echo, Color Doppler, Cardiac Doppler and Intracardiac            Opacification Agent Indications:     I50.31 CHF-Acute Diastolic  History:         Patient has no prior history of Echocardiogram examinations.  COPD, Signs/Symptoms:Shortness of Breath; Risk Factors:Diabetes                  and Current Smoker.  Sonographer:     Charmayne Sheer RDCS (AE) Referring Phys:  Baker Janus Soledad Gerlach NIU Diagnosing Phys: Bartholome Bill MD  Sonographer Comments: Technically difficult study due to poor echo windows. Image acquisition challenging due to patient body habitus and Image acquisition challenging due to COPD. IMPRESSIONS  1. Left ventricular ejection fraction, by estimation, is 55 to 60%. The left ventricle has normal function. The left ventricle has no regional wall motion abnormalities. Left ventricular diastolic parameters are consistent with Grade I diastolic dysfunction (impaired relaxation).  2. Right ventricular systolic function is normal. The right ventricular size is mildly enlarged.  3. Left atrial size was mildly dilated.  4. Right atrial size was mildly dilated.  5. The mitral valve was not well visualized. Trivial mitral valve regurgitation.  6. The aortic valve was not well visualized. Aortic valve regurgitation is trivial. FINDINGS  Left Ventricle: Left ventricular ejection fraction, by estimation, is 55 to 60%. The left ventricle has normal function. The left ventricle has no regional wall motion abnormalities. Definity contrast agent was given IV to delineate the left ventricular  endocardial borders. The left ventricular internal cavity size was normal in size. There is borderline left ventricular hypertrophy. Left ventricular diastolic parameters are consistent with Grade I diastolic dysfunction (impaired relaxation). Right Ventricle: The right ventricular size is mildly enlarged. No increase in right ventricular wall thickness. Right ventricular systolic function is  normal. Left Atrium: Left atrial size was mildly dilated. Right Atrium: Right atrial size was mildly dilated. Pericardium: There is no evidence of pericardial effusion. Mitral Valve: The mitral valve was not well visualized. Trivial mitral valve regurgitation. MV peak gradient, 7.4 mmHg. The mean mitral valve gradient is 3.0 mmHg. Tricuspid Valve: The tricuspid valve is not well visualized. Tricuspid valve regurgitation is trivial. Aortic Valve: The aortic valve was not well visualized. Aortic valve regurgitation is trivial. Aortic valve mean gradient measures 3.0 mmHg. Aortic valve peak gradient measures 6.8 mmHg. Aortic valve area, by VTI measures 2.46 cm. Pulmonic Valve: The pulmonic valve was not well visualized. Pulmonic valve regurgitation is not visualized. Aorta: The aortic root was not well visualized. IAS/Shunts: The interatrial septum was not assessed.  LEFT VENTRICLE PLAX 2D LVIDd:         4.15 cm     Diastology LVIDs:         3.64 cm     LV e' medial:    5.98 cm/s LV PW:         1.43 cm     LV E/e' medial:  18.3 LV IVS:        1.12 cm     LV e' lateral:   6.53 cm/s LVOT diam:     2.00 cm     LV E/e' lateral: 16.8 LV SV:         55 LV SV Index:   29 LVOT Area:     3.14 cm  LV Volumes (MOD) LV vol d, MOD A2C: 55.5 ml LV vol d, MOD A4C: 63.8 ml LV vol s, MOD A2C: 34.3 ml LV vol s, MOD A4C: 23.4 ml LV SV MOD A2C:     21.2 ml LV SV MOD A4C:     63.8 ml LV SV MOD BP:      34.2 ml RIGHT VENTRICLE RV Basal diam:  3.76 cm LEFT ATRIUM  Index       RIGHT ATRIUM           Index LA diam:        4.90 cm 2.61 cm/m  RA Area:     19.80 cm LA Vol (A2C):   59.5 ml 31.67 ml/m RA Volume:   61.10 ml  32.52 ml/m LA Vol (A4C):   57.5 ml 30.61 ml/m LA Biplane Vol: 61.2 ml 32.58 ml/m  AORTIC VALVE                   PULMONIC VALVE AV Area (Vmax):    2.51 cm    PV Vmax:       0.76 m/s AV Area (Vmean):   2.43 cm    PV Vmean:      53.200 cm/s AV Area (VTI):     2.46 cm    PV VTI:        0.126 m AV Vmax:            130.00 cm/s PV Peak grad:  2.3 mmHg AV Vmean:          83.500 cm/s PV Mean grad:  1.0 mmHg AV VTI:            0.225 m AV Peak Grad:      6.8 mmHg AV Mean Grad:      3.0 mmHg LVOT Vmax:         104.00 cm/s LVOT Vmean:        64.700 cm/s LVOT VTI:          0.176 m LVOT/AV VTI ratio: 0.78  AORTA Ao Root diam: 3.10 cm MITRAL VALVE                TRICUSPID VALVE MV Area (PHT): 3.00 cm     TR Peak grad:   28.9 mmHg MV Peak grad:  7.4 mmHg     TR Vmax:        269.00 cm/s MV Mean grad:  3.0 mmHg MV Vmax:       1.36 m/s     SHUNTS MV Vmean:      86.9 cm/s    Systemic VTI:  0.18 m MV Decel Time: 253 msec     Systemic Diam: 2.00 cm MV E velocity: 109.50 cm/s MV A velocity: 137.50 cm/s MV E/A ratio:  0.80 Bartholome Bill MD Electronically signed by Bartholome Bill MD Signature Date/Time: 04/04/2020/4:54:02 PM    Final    US Abdomen Limited RUQ (LIVER/GB)  Result Date: 04/11/2020 CLINICAL DATA:  Hyperbilirubinemia in a 74 year old female EXAM: ULTRASOUND ABDOMEN LIMITED RIGHT UPPER QUADRANT COMPARISON:  April 07, 2020 CT angiography of the chest and CT of the chest of April 06, 2020 FINDINGS: Gallbladder: Wall thickness at upper limits of normal. No visible pericholecystic fluid or evidence of cholelithiasis. No reported tenderness over the gallbladder. Common bile duct: Diameter: 2.2 mm Liver: Mildly heterogeneous echotexture with nodular contour and suggestion of fissural widening particularly on image 22. No visible lesion on submitted images. Portal vein is patent on color Doppler imaging with normal direction of blood flow towards the liver. Other: Trace ascites IMPRESSION: 1. Heterogeneous, mildly heterogeneous hepatic echotexture with question of lobular contour and fissural widening, findings could be seen in the setting of early liver disease. 2. Gallbladder wall thickness at upper limits of normal could be seen in the setting of liver disease and is nonspecific, not associated with biliary calculi, reported  tenderness or biliary duct dilation. Electronically  Signed   By: Zetta Bills M.D.   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  -12/15- DCD lasix , DCD solumedrol , started PO prednisone taper at 51m po daily  04/14/20- patient on bubbler 8L.  04/14/20- patient weaned to [place] 04/16/20- weaned to 5L/min, mentation is lucid GCS 10. Reviewed carepaln with Dr GArbutus Pedand family today.   Altered mental status with confusion-imporoved  -severe hypercapnic 3encephalopathy -Meds have been adjusted and mentation is improved -will obtain ABG to evaluate hypercanic encephalopathy -continue on BIPAP -[employer] [facility] - BIPAP qualifiacation - spirometry in process s/p ABG       Thank you for allowing me to participate in the care of this patient.    Patient/Family are satisfied with care plan and all questions have been answered.   This document was prepared using Dragon voice recognition software and may include unintentional dictation errors.     FOttie Glazier M.D.  Division of [place]

## 2020-04-16 NOTE — Progress Notes (Addendum)
Martha [name]  MRN: [id number]  DOB/AGE: 1946/01/10 74 y.o.  Primary Care Physician:Hande, Cherlyn Labella, MD  Admit date: 04/03/2020  Chief Complaint:  Chief Complaint  Patient presents with  . Leg Swelling    S-Pt presented on  04/03/2020 with  Chief Complaint  Patient presents with  . Leg Swelling    Patient offers no specific complaints. Patient daughter was present in the room. Patient daughter main complaint was that her swelling is better than before  Medications . amLODipine  10 mg Oral Daily  . vitamin C  1,000 mg Oral Daily  . carvedilol  6.25 mg Oral BID WC  . chlorhexidine  15 mL Mouth Rinse BID  . Chlorhexidine Gluconate Cloth  6 each Topical Daily  . enoxaparin (LOVENOX) injection  40 mg Subcutaneous Q24H  . feeding supplement (GLUCERNA SHAKE)  237 mL Oral TID BM  . fluticasone  1 spray Each Nare BID  . insulin aspart  0-5 Units Subcutaneous QHS  . insulin aspart  0-9 Units Subcutaneous TID WC  . insulin glargine  20 Units Subcutaneous Daily  . ipratropium-albuterol  3 mL Nebulization TID  . levothyroxine  150 mcg Oral Q0600  . losartan  100 mg Oral Daily  . mouth rinse  15 mL Mouth Rinse q12n4p  . multivitamin with minerals  1 tablet Oral Daily  . pantoprazole  40 mg Oral Daily  . predniSONE  40 mg Oral Q breakfast  . primidone  50 mg Oral BID  . rosuvastatin  5 mg Oral Daily  . sertraline  50 mg Oral Daily  . sodium chloride flush  3 mL Intravenous Q12H  . vitamin B-12  1,000 mcg Oral Daily         [id number] from the symptoms mentioned above,there are no other symptoms referable to all systems reviewed.  Physical Exam: Vital signs in last 24 hours: Temp:  [97.5 F (36.4 C)-98.9 F (37.2 C)] 98.9 F (37.2 C) (12/18 1644) Pulse Rate:  [56-65] 64 (12/18 1644) Resp:  [18] 18 (12/18 1644) BP: (138-173)/(50-69) 138/53 (12/18 1644) SpO2:  [70 %-97 %] 97 % (12/18 1644) Weight:  [68.7 kg] 68.7 kg (12/18 0448) Weight change: 0.045 kg Last BM  Date: 04/15/20  Intake/Output from previous day: 12/17 0701 - 12/18 0700 In: 1260 [P.O.:1260] Out: 2525 [Urine:2525] Total I/O In: 720 [P.O.:720] Out: 1150 [Urine:1150]   Physical Exam:  General- pt is awake,alert, follows commands  Resp- No acute REsp distress, decreased at bases  CVS- S1S2 regular in rate and rhythm  GIT- BS+, soft, Non tender , Non distended  EXT-bandages bilaterally on lower extremity, edema better,  No Cyanosis    Lab Results:  CBC  Recent Labs    04/15/20 0417 04/16/20 0440  WBC 14.0* 10.6*  HGB 14.3 13.3  HCT 45.6 42.0  PLT 180 188    BMET  Recent Labs    04/15/20 0417 04/16/20 0440  NA 142 137  K 4.4 3.9  CL 101 95*  CO2 31 33*  GLUCOSE 98 109*  BUN 26* 27*  CREATININE 0.50 0.42*  CALCIUM 9.2 8.6*      Most recent Creatinine trend  Lab Results  Component Value Date   CREATININE 0.42 (L) 04/16/2020   CREATININE 0.50 04/15/2020   CREATININE 0.47 04/14/2020      MICRO   Recent Results (from the past 240 hour(s))  Urine Culture     Status: None   Collection Time: 04/08/20  9:52 AM   Specimen:  Urine, Random  Result Value Ref Range Status   Specimen Description   Final    URINE, RANDOM Performed at [facility], [address] [place], [place] [address]    Special Requests   Final    NONE Performed at [facility], [address], [place], [place] [address]    Culture   Final    NO GROWTH Performed at [facility], [place] [address], [place], [place] [address]    Report Status 04/09/2020 FINAL  Final  CULTURE, BLOOD (ROUTINE X 2) w Reflex to ID Panel     Status: None   Collection Time: 04/08/20 10:02 AM   Specimen: BLOOD  Result Value Ref Range Status   Specimen Description BLOOD BLOOD RIGHT HAND  Final   Special Requests   Final    BOTTLES DRAWN AEROBIC AND ANAEROBIC Blood Culture results may not be optimal due to an inadequate volume of blood received in culture bottles   Culture    Final    NO GROWTH 5 DAYS Performed at [facility], [address] [place], [place] [address]    Report Status 04/13/2020 FINAL  Final  MRSA PCR Screening     Status: None   Collection Time: 04/08/20 10:04 AM   Specimen: Nasal Mucosa; Nasopharyngeal  Result Value Ref Range Status   MRSA by PCR NEGATIVE NEGATIVE Final    Comment:        The GeneXpert MRSA Assay (FDA approved for NASAL specimens only), is one component of a comprehensive MRSA colonization surveillance program. It is not intended to diagnose MRSA infection nor to guide or monitor treatment for MRSA infections. Performed at [facility], [address] [place], [place] [address]   CULTURE, BLOOD (ROUTINE X 2) w Reflex to ID Panel     Status: None   Collection Time: 04/08/20 10:26 AM   Specimen: BLOOD  Result Value Ref Range Status   Specimen Description BLOOD BLOOD RIGHT HAND  Final   Special Requests   Final    BOTTLES DRAWN AEROBIC AND ANAEROBIC Blood Culture results may not be optimal due to an inadequate volume of blood received in culture bottles   Culture   Final    NO GROWTH 5 DAYS Performed at [facility], [address], [place], [place] [address]    Report Status 04/13/2020 FINAL  Final  Urine Culture     Status: None   Collection Time: 04/10/20  6:21 PM   Specimen: Urine, Random  Result Value Ref Range Status   Specimen Description   Final    URINE, RANDOM Performed at [facility], [address] [place], [place] [address]    Special Requests   Final    NONE Performed at [facility], [address], [place], [place] [address]    Culture   Final    NO GROWTH Performed at [facility], [place] [address], [place], [place] [address]    Report Status 04/11/2020 FINAL  Final  MRSA PCR Screening     Status: None   Collection Time: 04/12/20  3:25 PM   Specimen: Nasopharyngeal  Result Value Ref Range Status   MRSA by PCR NEGATIVE NEGATIVE  Final    Comment:        The GeneXpert MRSA Assay (FDA approved for NASAL specimens only), is one component of a comprehensive MRSA colonization surveillance program. It is not intended to diagnose MRSA infection nor to guide  or monitor treatment for MRSA infections. Performed at [facility], [address], [place], [place] [address]          Impression:   74 y.o. female  [name] is a 74 y.o. 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 At this time patient will not be able to cooperate and lie flat to get biopsy done.   Patient autoimmune work-up negative so far Results for MANDOLIN, FALWELL (MRN [id number]) as of 04/16/2020 10:29  Ref. Range 04/04/2020 20:20  Anti Nuclear Antibody (ANA) Latest Ref Range: Negative  Negative  ANCA Proteinase 3 Latest Ref Range: 0.0 - 3.5 U/mL <3.5  GBM Ab Latest Ref Range: 0 - 20 units 3  Myeloperoxidase Abs Latest Ref Range: 0.0 - 9.0 U/mL <9.0  C3 Complement Latest Ref Range: 82 - 167 mg/dL 138  Complement C4, Body Fluid Latest Ref Range: 12 - 38 mg/dL 25     Patient ANCA profile has been negative/patient ANA was negative We will ask for anti-PLA 2R antibody  2)HTN    Blood pressure is stable    3)Anemia none CBC Latest Ref Rng & Units 04/16/2020 04/15/2020 04/14/2020  WBC 4.0 - 10.5 K/uL 10.6(H) 14.0(H) 14.1(H)  Hemoglobin 12.0 - 15.0 g/dL 13.3 14.3 14.9  Hematocrit 36.0 - 46.0 % 42.0 45.6 47.3(H)   Platelets 150 - 400 K/uL 188 180 188       HGb at goal (9--11)   4)anasarca Patient has anasarca secondary to nephrotic syndrome Possible contribution from acute on chronic diastolic CHF Patient is currently on diuretics  Patient is negative by nearly 15 L since admission    5) diabetes mellitus type 2 Insulin closely followed the primary team  6) Electrolytes   BMP Latest Ref Rng & Units 04/16/2020 04/15/2020 04/14/2020  Glucose 70 - 99 mg/dL 109(H) 98 173(H)  BUN 8 - 23 mg/dL 27(H) 26(H) 29(H)  Creatinine 0.44 - 1.00 mg/dL 0.42(L) 0.50 0.47  Sodium 135 - 145 mmol/L 137 142 145  Potassium 3.5 - 5.1 mmol/L 3.9 4.4 4.3  Chloride 98 - 111 mmol/L 95(L) 101 99  CO2 22 - 32 mmol/L 33(H) 31 35(H)  Calcium 8.9 - 10.3 mg/dL 8.6(L) 9.2 9.2     Sodium Normonatremic   Potassium Normokalemic    7)Acid base    Co2 at goal     Plan:    We will ask for Anti PLA2 R antibody Miscellaneous [facility] test (send-out) (Order [id number])  We will continue the current diuresis       Martha [name] s [facility] 04/16/2020, 4:57 PM

## 2020-04-17 ENCOUNTER — Inpatient Hospital Stay: Payer: Medicare Other

## 2020-04-17 LAB — BASIC METABOLIC PANEL
Anion gap: 8 (ref 5–15)
BUN: 28 mg/dL — ABNORMAL HIGH (ref 8–23)
CO2: 31 mmol/L (ref 22–32)
Calcium: 8.4 mg/dL — ABNORMAL LOW (ref 8.9–10.3)
Chloride: 93 mmol/L — ABNORMAL LOW (ref 98–111)
Creatinine, Ser: 0.54 mg/dL (ref 0.44–1.00)
GFR, Estimated: >60 mL/min (ref >60)
Glucose, Bld: 94 mg/dL (ref 70–99)
Potassium: 3.5 mmol/L (ref 3.5–5.1)
Sodium: 132 mmol/L — ABNORMAL LOW (ref 135–145)

## 2020-04-17 LAB — CBC
HCT: 41.4 % (ref 36.0–46.0)
Hemoglobin: 13.4 g/dL (ref 12.0–15.0)
MCH: 28.3 pg (ref 26.0–34.0)
MCHC: 32.4 g/dL (ref 30.0–36.0)
MCV: 87.3 fL (ref 80.0–100.0)
Platelets: 222 10*3/uL (ref 150–400)
RBC: 4.74 MIL/uL (ref 3.87–5.11)
RDW: 15.6 % — ABNORMAL HIGH (ref 11.5–15.5)
WBC: 11.7 10*3/uL — ABNORMAL HIGH (ref 4.0–10.5)
nRBC: 0 % (ref 0.0–0.2)

## 2020-04-17 LAB — GLUCOSE, CAPILLARY
Glucose-Capillary: 134 mg/dL — ABNORMAL HIGH (ref 70–99)
Glucose-Capillary: 172 mg/dL — ABNORMAL HIGH (ref 70–99)
Glucose-Capillary: 178 mg/dL — ABNORMAL HIGH (ref 70–99)
Glucose-Capillary: 271 mg/dL — ABNORMAL HIGH (ref 70–99)
Glucose-Capillary: 236 mg/dL — ABNORMAL HIGH (ref 70–99)

## 2020-04-17 LAB — BRAIN NATRIURETIC PEPTIDE: B Natriuretic Peptide: 674.5 pg/mL — ABNORMAL HIGH (ref 0.0–100.0)

## 2020-04-17 LAB — MAGNESIUM: Magnesium: 2.2 mg/dL (ref 1.7–2.4)

## 2020-04-17 MED ORDER — CARVEDILOL 12.5 MG PO TABS
12.5000 mg | ORAL_TABLET | Freq: Two times a day (BID) | ORAL | Status: DC
  Administered 2020-04-17 – 2020-04-21 (×6): 12.5 mg via ORAL
  Filled 2020-04-17 (×8): qty 1

## 2020-04-17 MED ORDER — PREDNISONE 20 MG PO TABS
30.0000 mg | ORAL_TABLET | Freq: Every day | ORAL | Status: DC
  Administered 2020-04-18: 13:00:00 30 mg via ORAL
  Filled 2020-04-17 (×2): qty 1

## 2020-04-17 NOTE — Plan of Care (Signed)

## 2020-04-17 NOTE — Progress Notes (Signed)
Patient Name: Martha [name] Date of Encounter: 04/17/2020  Hospital Problem List     Principal Problem:   Acute CHF (congestive heart failure) (HCC) Active Problems:   COPD exacerbation (HCC)   Diabetes mellitus without complication ([place])   Current tobacco use   Hyperlipidemia   Essential hypertension   Hypothyroidism   Gout   Depression   Tremors of nervous system   Acute respiratory failure with hypoxia (HCC)   Cellulitis of lower extremity   Acute pulmonary edema (HCC)   Elevated brain natriuretic peptide (BNP) level   Nephrotic syndrome    Patient Profile     74 year old with history of hypertension, hyperlipidemia, diabetes, COPD, smoking, thyroid disease, depression, tremor, and GERD presents with bilateral leg edema swelling redness pain. Patient admitted with cellulitis or lower extremities. She was having wound care done to her legs at home. She was  seen by vascular  in the past. She presented with severe hypoxemia COPD on BiPAP. She has a long history of smoking . She improved with diuresis and treatment of her reactive airway disease. She has required resumption of bipap several days ago and was placed back on steroids and abx for possible aspiration pna. BNP was 1371 yesterday . Was 3607 7 days ago and 1113 on admission. Renal function has remained stable. CXR showed cardiomegaly with pulmonary vascular congestion and chf.   Subjective   Denies chest pain or shortness of breath.  Resting comfortably in a supine position.  Inpatient Medications    . amLODipine  10 mg Oral Daily  . vitamin C  1,000 mg Oral Daily  . carvedilol  6.25 mg Oral BID WC  . chlorhexidine  15 mL Mouth Rinse BID  . Chlorhexidine Gluconate Cloth  6 each Topical Daily  . enoxaparin (LOVENOX) injection  40 mg Subcutaneous Q24H  . feeding supplement (GLUCERNA SHAKE)  237 mL Oral TID BM  . fluticasone  1 spray Each Nare BID  . insulin aspart  0-5 Units Subcutaneous QHS  . insulin aspart   0-9 Units Subcutaneous TID WC  . insulin glargine  20 Units Subcutaneous Daily  . ipratropium-albuterol  3 mL Nebulization TID  . levothyroxine  150 mcg Oral Q0600  . losartan  100 mg Oral Daily  . mouth rinse  15 mL Mouth Rinse q12n4p  . multivitamin with minerals  1 tablet Oral Daily  . pantoprazole  40 mg Oral Daily  . predniSONE  40 mg Oral Q breakfast  . primidone  50 mg Oral BID  . rosuvastatin  5 mg Oral Daily  . sertraline  50 mg Oral Daily  . sodium chloride flush  3 mL Intravenous Q12H  . vitamin B-12  1,000 mcg Oral Daily    Vital Signs    Vitals:   04/17/20 0340 04/17/20 0422 04/17/20 0733 04/17/20 0803  BP:  (!) 162/71  (!) 157/65  Pulse:  61  60  Resp:    19  Temp:  98.6 F (37 C)  98.7 F (37.1 C)  TempSrc:  Oral  Oral  SpO2:  91% 93% 98%  Weight: 67 kg     Height:        Intake/Output Summary (Last 24 hours) at 04/17/2020 0942 Last data filed at 04/17/2020 0500 Gross per 24 hour  Intake 240 ml  Output 1772 ml  Net -1532 ml   Filed Weights   04/15/20 0439 04/16/20 0448 04/17/20 0340  Weight: 68.2 kg 68.7 kg 67 kg  Physical Exam    GEN: Well nourished, well developed, in no acute distress.  HEENT: normal.  Neck: Supple, no JVD, carotid bruits, or masses. Cardiac: RRR, no murmurs, rubs, or gallops. No clubbing, cyanosis, edema.  Radials/DP/PT 2+ and equal bilaterally.  Respiratory:  Respirations regular and unlabored, clear to auscultation bilaterally. GI: Soft, nontender, nondistended, BS + x 4. MS: no deformity or atrophy. Skin: warm and dry, no rash. Neuro:  Strength and sensation are intact. Psych: Normal affect.  Labs    CBC Recent Labs    04/16/20 0440 04/17/20 0354  WBC 10.6* 11.7*  HGB 13.3 13.4  HCT 42.0 41.4  MCV 89.6 87.3  PLT 188 376   Basic Metabolic Panel Recent Labs    04/16/20 0440 04/17/20 0354  NA 137 132*  K 3.9 3.5  CL 95* 93*  CO2 33* 31  GLUCOSE 109* 94  BUN 27* 28*  CREATININE 0.42* 0.54  CALCIUM  8.6* 8.4*  MG 2.1 2.2   Liver Function Tests Recent Labs    04/15/20 0417  AST 27  ALT 44  ALKPHOS 150*  BILITOT 1.2  PROT 6.0*  ALBUMIN 3.5   No results for input(s): LIPASE, AMYLASE in the last 72 hours. Cardiac Enzymes No results for input(s): CKTOTAL, CKMB, CKMBINDEX, TROPONINI in the last 72 hours. BNP Recent Labs    04/15/20 0417 04/17/20 0354  BNP 1,371.4* 674.5*   D-Dimer No results for input(s): DDIMER in the last 72 hours. Hemoglobin A1C No results for input(s): HGBA1C in the last 72 hours. Fasting Lipid Panel No results for input(s): CHOL, HDL, LDLCALC, TRIG, CHOLHDL, LDLDIRECT in the last 72 hours. Thyroid Function Tests No results for input(s): TSH, T4TOTAL, T3FREE, THYROIDAB in the last 72 hours.  Invalid input(s): FREET3  Telemetry    Sinus rhythm at a rate of 63.  ECG    Sinus rhythm with no ischemia  Radiology    DG Chest 1 View  Result Date: 04/09/2020 CLINICAL DATA:  Acute respiratory failure EXAM: CHEST  1 VIEW COMPARISON:  04/03/2016 FINDINGS: Normal cardiac silhouette. Interval increase in interstitial edema pattern. No focal consolidation. No pneumothorax. IMPRESSION: Worsening interstitial edema. Electronically Signed   By: Suzy Bouchard M.D.   On: 04/09/2020 06:31   DG Chest 2 View  Result Date: 04/03/2020 CLINICAL DATA:  Hypoxia. Leg edema and shortness of breath. Pt daughter states that pt is supposed to be getting a renal bx but they are not able to do it because of the edema in pts legs. Pt states that she does feel short of breath. Pt does not wear oxygen at home. Pt placed on O2 in triage due to sats being in the 80s. Hx of asthma, chronic bronchitis, COPD, thyroid cancer- 2009, DM, current smoker. EXAM: CHEST - 2 VIEW COMPARISON:  01/04/2018 FINDINGS: Cardiac silhouette is borderline enlarged. Stable aortic atherosclerotic calcifications. No mediastinal or hilar masses. No evidence of adenopathy. Lungs demonstrate diffuse  bilateral irregular interstitial thickening which has increased compared to the prior study. There is also linear/reticular scarring in the left lung base, which is stable. No lung consolidation. No pleural effusion and no pneumothorax. Skeletal structures are demineralized, but grossly intact. IMPRESSION: 1. Bilateral irregular interstitial thickening, increased compared to the prior study, along with borderline cardiomegaly. Suspect mild congestive heart failure. Consider diffuse interstitial infection or inflammation in the proper clinical setting. No evidence of lobar pneumonia. Electronically Signed   By: Lajean Manes M.D.   On: 04/03/2020 13:06  CT CHEST W CONTRAST  Result Date: 04/06/2020 CLINICAL DATA:  Hemoptysis. EXAM: CT CHEST WITH CONTRAST TECHNIQUE: Multidetector CT imaging of the chest was performed during intravenous contrast administration. CONTRAST:  37mL OMNIPAQUE IOHEXOL 300 MG/ML  SOLN COMPARISON:  January 06, 2019 FINDINGS: Cardiovascular: There is marked severity calcification of the aortic arch. There is mild cardiomegaly. No pericardial effusion. Mediastinum/Nodes: There is mild pretracheal and right hilar lymphadenopathy. Thyroid gland, trachea, and esophagus demonstrate no significant findings. Lungs/Pleura: Mild-to-moderate severity emphysematous lung disease is seen involving the bilateral upper lobes with stable bullous disease is seen along the medial aspect of the left apex. Mild to moderate severity areas of atelectasis and/or infiltrate are seen within the posterior aspects of the bilateral upper lobes and left lung base. Moderate to marked severity posterior right basilar atelectasis and/or infiltrate is noted. A very small right pleural effusion is seen. No pneumothorax is identified. Upper Abdomen: There is stable hepatosplenomegaly with stable, moderate severity diffuse bilateral adrenal gland enlargement. Musculoskeletal: A chronic lateral eighth left rib fracture is  seen. A chronic compression fracture deformity of the T12 vertebral body is noted. IMPRESSION: 1. Mild-to-moderate severity posterior bilateral upper lobe and left basilar atelectasis and/or infiltrate. 2. Moderate to marked severity posterior right basilar atelectasis and/or infiltrate. 3. Very small right pleural effusion. 4. Mild cardiomegaly. 5. Stable emphysematous lung disease. 6. Chronic lateral eighth left rib fracture. 7. Chronic compression fracture deformity of the T12 vertebral body. 8. Aortic atherosclerosis. Aortic Atherosclerosis (ICD10-I70.0) and Emphysema (ICD10-J43.9). Electronically Signed   By: Virgina Norfolk M.D.   On: 04/06/2020 15:33   CT ANGIO CHEST PE W OR WO CONTRAST  Result Date: 04/07/2020 CLINICAL DATA:  Shortness of breath EXAM: CT ANGIOGRAPHY CHEST WITH CONTRAST TECHNIQUE: Multidetector CT imaging of the chest was performed using the standard protocol during bolus administration of intravenous contrast. Multiplanar CT image reconstructions and MIPs were obtained to evaluate the vascular anatomy. CONTRAST:  98mL OMNIPAQUE IOHEXOL 350 MG/ML SOLN COMPARISON:  April 06, 2020 FINDINGS: Cardiovascular: There is a optimal opacification of the pulmonary arteries. There is no central,segmental, or subsegmental filling defects within the pulmonary arteries. There is unchanged cardiomegaly. Mitral valve calcifications and coronary artery calcifications are seen. No pericardial effusion or thickening. No evidence right heart strain. There is normal three-vessel brachiocephalic anatomy without proximal stenosis. Scattered aortic atherosclerosis is noted. Mediastinum/Nodes: No hilar, mediastinal, or axillary adenopathy. Thyroid gland, trachea, and esophagus demonstrate no significant findings. Lungs/Pleura: Patchy airspace opacity seen at the left upper lung and posterior right lung base with air bronchograms. There are trace bilateral pleural effusions present. Centrilobular and paraseptal  emphysematous changes seen at both lung apices. Upper Abdomen: No acute abnormalities present in the visualized portions of the upper abdomen. Musculoskeletal: No chest wall abnormality. No acute or significant osseous findings. There is a chronic anterior wedge compression deformity of the T12 vertebral body with 50% loss in vertebral body height. Review of the MIP images confirms the above findings. IMPRESSION: No central, segmental, or subsegmental pulmonary embolism. Unchanged multifocal patchy airspace opacities within both lungs which could be due to multifocal pneumonia Trace bilateral pleural effusions, right greater than left. Aortic Atherosclerosis (ICD10-I70.0). Electronically Signed   By: Prudencio Pair M.D.   On: 04/07/2020 19:43   CT CHEST ABDOMEN PELVIS W CONTRAST  Result Date: 04/12/2020 CLINICAL DATA:  Shortness of breath, abdominal pain, and bilateral leg swelling. EXAM: CT CHEST, ABDOMEN, AND PELVIS WITH CONTRAST TECHNIQUE: Multidetector CT imaging of the chest, abdomen and pelvis was  performed following the standard protocol during bolus administration of intravenous contrast. CONTRAST:  179mL OMNIPAQUE IOHEXOL 300 MG/ML  SOLN COMPARISON:  Right upper quadrant ultrasound from yesterday. CTA chest dated April 07, 2020. FINDINGS: CT CHEST FINDINGS Cardiovascular: Unchanged borderline cardiomegaly. No pericardial effusion. No thoracic aortic aneurysm or dissection. Coronary, aortic arch, and branch vessel atherosclerotic vascular disease. 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 No central pulmonary embolism. Mediastinum/Nodes: Unchanged mildly enlarged precarinal lymph node measuring 12 mm in short axis, stable since September 2020, likely reactive. No enlarged hilar or axillary lymph nodes. Prior thyroidectomy. Prominent circumferential wall thickening of the proximal and mid esophagus, slightly  worsened since the prior study. Lungs/Pleura: Slightly increased small right pleural effusion. New trace left pleural effusion. Prominent atelectasis within the right lower lobe with patchy areas of non enhancement and air bronchograms consistent with underlying pneumonia, slightly worsened when compared to prior study. Increased patchy consolidation in both posterior upper lobes. Minimal subsegmental atelectasis in the left lower lobe. Moderate centrilobular and mild paraseptal emphysema again noted with bullous changes in the medial left lung apex. No pneumothorax. 7 mm subpleural nodule in the left upper lobe (series 4, image 39), similar to most recent chest CTs, but slightly more conspicuous when compared to CT chest from September 2020. Musculoskeletal: No acute or significant osseous findings. Unchanged chronic T12 compression deformity. Old left eighth rib fracture. CT ABDOMEN PELVIS FINDINGS Hepatobiliary: Slightly lobular liver contour. No focal liver abnormality is seen. No gallstones, gallbladder wall thickening, or biliary dilatation. Pancreas: Unremarkable. No pancreatic ductal dilatation or surrounding inflammatory changes. Spleen: Normal in size without focal abnormality. Adrenals/Urinary Tract: Chronic bilateral adrenal hypertrophy. No renal calculi, focal lesion, or hydronephrosis. The bladder is unremarkable. Stomach/Bowel: Stomach is within normal limits. Appendix appears normal. No evidence of bowel wall thickening, distention, or inflammatory changes. Prominent sigmoid colonic diverticulosis. Vascular/Lymphatic: Aortic atherosclerosis. 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 No enlarged abdominal or pelvic lymph nodes. Reproductive: Uterus and bilateral adnexa are unremarkable. Other: Scattered trace ascites. No pneumoperitoneum. Small fat containing paraumbilical hernias. Musculoskeletal: No acute or significant osseous findings. IMPRESSION: CT chest: 1. Mildly worsened  multifocal pneumonia. 2. Slightly increased small right pleural effusion. New trace left pleural effusion. 3. 7 mm subpleural nodule in the left upper lobe, similar to most recent chest CTs, but slightly more conspicuous when compared to CT chest from September 2020. While this may be infectious or inflammatory, attention on follow-up imaging is recommended. 4. Prominent circumferential wall thickening of the proximal and mid esophagus, slightly worsened since the prior study, concerning for esophagitis. 5. 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 6. Aortic Atherosclerosis (ICD10-I70.0) and Emphysema (ICD10-J43.9). CT abdomen and pelvis: 1. No acute intra-abdominal process. 2. Slightly lobular liver contour with scattered trace ascites, suggestive of cirrhosis. 3. 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 Electronically Signed   By: Titus Dubin M.D.   On: 04/12/2020 13:51   US Venous Img Lower Bilateral (DVT)  Result Date: 04/03/2020 CLINICAL DATA:  Lower leg cellulitis EXAM: BILATERAL LOWER EXTREMITY VENOUS DOPPLER ULTRASOUND TECHNIQUE: Gray-scale sonography with graded compression, as well as color Doppler and duplex ultrasound were performed to evaluate the lower extremity deep venous systems from the level of the common femoral vein and including the common femoral, femoral, profunda femoral, popliteal and calf veins including the posterior tibial, peroneal and gastrocnemius veins  when visible. The superficial great saphenous vein was also interrogated. Spectral Doppler was utilized to evaluate flow at rest and with distal augmentation maneuvers in the common femoral, femoral and popliteal veins. COMPARISON:  None. FINDINGS: RIGHT LOWER EXTREMITY Common Femoral Vein: No evidence of thrombus. Normal compressibility, respiratory phasicity and response to augmentation. Saphenofemoral Junction: No evidence  of thrombus. Normal compressibility and flow on color Doppler imaging. Profunda Femoral Vein: No evidence of thrombus. Normal compressibility and flow on color Doppler imaging. Femoral Vein: No evidence of thrombus. Normal compressibility, respiratory phasicity and response to augmentation. Popliteal Vein: Not well visualized due to overlying edema. Calf Veins: Not well visualized due to overlying edema. Superficial Great Saphenous Vein: No evidence of thrombus. Normal compressibility. Venous Reflux:  None. Other Findings:  None. LEFT LOWER EXTREMITY Common Femoral Vein: No evidence of thrombus. Normal compressibility, respiratory phasicity and response to augmentation. Saphenofemoral Junction: No evidence of thrombus. Normal compressibility and flow on color Doppler imaging. Profunda Femoral Vein: No evidence of thrombus. Normal compressibility and flow on color Doppler imaging. Femoral Vein: No evidence of thrombus. Normal compressibility, respiratory phasicity and response to augmentation. Popliteal Vein: Not well visualized due to overlying edema. Calf Veins: Not well visualized due to overlying edema. Superficial Great Saphenous Vein: No evidence of thrombus. Normal compressibility. Venous Reflux:  None. Other Findings:  None. IMPRESSION: Somewhat limited exam due to peripheral edema although no central deep venous thrombosis is noted. Electronically Signed   By: Inez Catalina M.D.   On: 04/03/2020 15:58   DG Chest Port 1 View  Result Date: 04/15/2020 CLINICAL DATA:  Shortness of breath with soft tissue edema EXAM: PORTABLE CHEST 1 VIEW COMPARISON:  Chest radiograph April 09, 2020 and chest CT April 12, 2020 FINDINGS: There is diffuse interstitial prominence, likely representing a combination of fibrosis and interstitial pulmonary edema. There is atelectatic change in the left mid lung. There are small pleural effusions bilaterally. No consolidation. There is cardiomegaly with a degree of pulmonary  venous hypertension. There is aortic atherosclerosis. No appreciable adenopathy. Foci of calcification noted in each carotid artery. Calcification noted in the lateral left shoulder, stable. IMPRESSION: Suspect combination of interstitial pulmonary edema and underlying fibrosis. No consolidation. Small pleural effusions bilaterally. Cardiomegaly with pulmonary vascular congestion. Suspect a degree of underlying congestive heart failure. Aortic Atherosclerosis (ICD10-I70.0). Electronically Signed   By: Lowella Grip III M.D.   On: 04/15/2020 09:01   ECHOCARDIOGRAM COMPLETE  Result Date: 04/04/2020    ECHOCARDIOGRAM REPORT   Patient Name:   Martha [name] Date of Exam: 04/04/2020 Medical Rec #:  [id number]      Height:       64.0 in Accession #:    [id number]     Weight:       181.8 lb Date of Birth:  [birth year]     BSA:          1.879 m Patient Age:    47 years       BP:           171/78 mmHg Patient Gender: F              HR:           98 bpm. Exam Location:  [facility] Procedure: 2D Echo, Color Doppler, Cardiac Doppler and Intracardiac            Opacification Agent Indications:     I50.31 CHF-Acute Diastolic  History:         Patient has  no prior history of Echocardiogram examinations.                  COPD, Signs/Symptoms:Shortness of Breath; Risk Factors:Diabetes                  and Current Smoker.  Sonographer:     Charmayne Sheer RDCS (AE) Referring Phys:  Baker Janus Soledad Gerlach NIU Diagnosing Phys: Bartholome Bill MD  Sonographer Comments: Technically difficult study due to poor echo windows. Image acquisition challenging due to patient body habitus and Image acquisition challenging due to COPD. IMPRESSIONS  1. Left ventricular ejection fraction, by estimation, is 55 to 60%. The left ventricle has normal function. The left ventricle has no regional wall motion abnormalities. Left ventricular diastolic parameters are consistent with Grade I diastolic dysfunction (impaired relaxation).  2. Right ventricular systolic function is  normal. The right ventricular size is mildly enlarged.  3. Left atrial size was mildly dilated.  4. Right atrial size was mildly dilated.  5. The mitral valve was not well visualized. Trivial mitral valve regurgitation.  6. The aortic valve was not well visualized. Aortic valve regurgitation is trivial. FINDINGS  Left Ventricle: Left ventricular ejection fraction, by estimation, is 55 to 60%. The left ventricle has normal function. The left ventricle has no regional wall motion abnormalities. Definity contrast agent was given IV to delineate the left ventricular  endocardial borders. The left ventricular internal cavity size was normal in size. There is borderline left ventricular hypertrophy. Left ventricular diastolic parameters are consistent with Grade I diastolic dysfunction (impaired relaxation). Right Ventricle: The right ventricular size is mildly enlarged. No increase in right ventricular wall thickness. Right ventricular systolic function is normal. Left Atrium: Left atrial size was mildly dilated. Right Atrium: Right atrial size was mildly dilated. Pericardium: There is no evidence of pericardial effusion. Mitral Valve: The mitral valve was not well visualized. Trivial mitral valve regurgitation. MV peak gradient, 7.4 mmHg. The mean mitral valve gradient is 3.0 mmHg. Tricuspid Valve: The tricuspid valve is not well visualized. Tricuspid valve regurgitation is trivial. Aortic Valve: The aortic valve was not well visualized. Aortic valve regurgitation is trivial. Aortic valve mean gradient measures 3.0 mmHg. Aortic valve peak gradient measures 6.8 mmHg. Aortic valve area, by VTI measures 2.46 cm. Pulmonic Valve: The pulmonic valve was not well visualized. Pulmonic valve regurgitation is not visualized. Aorta: The aortic root was not well visualized. IAS/Shunts: The interatrial septum was not assessed.  LEFT VENTRICLE PLAX 2D LVIDd:         4.15 cm     Diastology LVIDs:         3.64 cm     LV e' medial:     5.98 cm/s LV PW:         1.43 cm     LV E/e' medial:  18.3 LV IVS:        1.12 cm     LV e' lateral:   6.53 cm/s LVOT diam:     2.00 cm     LV E/e' lateral: 16.8 LV SV:         55 LV SV Index:   29 LVOT Area:     3.14 cm  LV Volumes (MOD) LV vol d, MOD A2C: 55.5 ml LV vol d, MOD A4C: 63.8 ml LV vol s, MOD A2C: 34.3 ml LV vol s, MOD A4C: 23.4 ml LV SV MOD A2C:     21.2 ml LV SV MOD A4C:     63.8  ml LV SV MOD BP:      34.2 ml RIGHT VENTRICLE RV Basal diam:  3.76 cm LEFT ATRIUM             Index       RIGHT ATRIUM           Index LA diam:        4.90 cm 2.61 cm/m  RA Area:     19.80 cm LA Vol (A2C):   59.5 ml 31.67 ml/m RA Volume:   61.10 ml  32.52 ml/m LA Vol (A4C):   57.5 ml 30.61 ml/m LA Biplane Vol: 61.2 ml 32.58 ml/m  AORTIC VALVE                   PULMONIC VALVE AV Area (Vmax):    2.51 cm    PV Vmax:       0.76 m/s AV Area (Vmean):   2.43 cm    PV Vmean:      53.200 cm/s AV Area (VTI):     2.46 cm    PV VTI:        0.126 m AV Vmax:           130.00 cm/s PV Peak grad:  2.3 mmHg AV Vmean:          83.500 cm/s PV Mean grad:  1.0 mmHg AV VTI:            0.225 m AV Peak Grad:      6.8 mmHg AV Mean Grad:      3.0 mmHg LVOT Vmax:         104.00 cm/s LVOT Vmean:        64.700 cm/s LVOT VTI:          0.176 m LVOT/AV VTI ratio: 0.78  AORTA Ao Root diam: 3.10 cm MITRAL VALVE                TRICUSPID VALVE MV Area (PHT): 3.00 cm     TR Peak grad:   28.9 mmHg MV Peak grad:  7.4 mmHg     TR Vmax:        269.00 cm/s MV Mean grad:  3.0 mmHg MV Vmax:       1.36 m/s     SHUNTS MV Vmean:      86.9 cm/s    Systemic VTI:  0.18 m MV Decel Time: 253 msec     Systemic Diam: 2.00 cm MV E velocity: 109.50 cm/s MV A velocity: 137.50 cm/s MV E/A ratio:  0.80 Bartholome Bill MD Electronically signed by Bartholome Bill MD Signature Date/Time: 04/04/2020/4:54:02 PM    Final    US Abdomen Limited RUQ (LIVER/GB)  Result Date: 04/11/2020 CLINICAL DATA:  Hyperbilirubinemia in a 74 year old female EXAM: ULTRASOUND ABDOMEN LIMITED RIGHT  UPPER QUADRANT COMPARISON:  April 07, 2020 CT angiography of the chest and CT of the chest of April 06, 2020 FINDINGS: Gallbladder: Wall thickness at upper limits of normal. No visible pericholecystic fluid or evidence of cholelithiasis. No reported tenderness over the gallbladder. Common bile duct: Diameter: 2.2 mm Liver: Mildly heterogeneous echotexture with nodular contour and suggestion of fissural widening particularly on image 22. No visible lesion on submitted images. Portal vein is patent on color Doppler imaging with normal direction of blood flow towards the liver. Other: Trace ascites IMPRESSION: 1. Heterogeneous, mildly heterogeneous hepatic echotexture with question of lobular contour and fissural widening, findings could be seen in the setting of early liver disease.  2. Gallbladder wall thickness at upper limits of normal could be seen in the setting of liver disease and is nonspecific, not associated with biliary calculi, reported tenderness or biliary duct dilation. Electronically Signed   By: Zetta Bills M.D.   On: 04/11/2020 12:23    Assessment & Plan     Principal Problem: Acute CHF (congestive heart failure) (HCC) Active Problems: COPD exacerbation (HCC) Diabetes mellitus without complication ([place]) Current tobacco use Hyperlipidemia Essential hypertension Hypothyroidism Gout Depression Tremors of nervous system Acute respiratory failure with hypoxia (HCC) Cellulitis of lower extremity Acute pulmonary edema (HCC) Elevated brain natriuretic peptide (BNP) level Nephrotic syndrome   1.Acute on chronic diastolic congestive heart failure,presenting with shortness of breath and bilateral lower extremity edema,LVEF 55 to 60% by 2D echocardiogram,previously on Lasix drip. Has been off of diuretics for several days and developed volume overload.  This improved with diuresis.  She is -1 L in the past 24 hours.  She is -16.5 L since  hospital admission.  BNP is improved to 674 down from 1371 2 days ago.  Appears euvolemic.  She is on carvedilol 6.25 mg bid and losartan 100 daily, amlodipine 10 mg daily with blood pressure control that is still not optimal.  We will increase the carvedilol to 12.5 mg twice dai.   Will follow blood pressure response to this. Will also need to follow for evidence of increased bronchospasm with this change however thus far she appears to be tolerating carvedilol well.    2.Acute on chronic hypoxic respiratory failure, multifactorial, secondary to CHF, and right lower lobe pneumonia, followed by pulmonary,O2 by nasal cannula. Continue with steroids and abx. Follow for bronchospasm with change to carvedilol.   3.Encephalopathy, improved over the course of the admission.  Appears oriented to me this morning.  4.Nephrotic syndrome, followed by nephrology.  Creatinine stable.5.Essential hypertension,on amlodipine, metoprolol tartrate, andlosartan,blood pressure remainsmildlyelevated. Will try changing to carvedilol as per above.   6. COPD exacerbationwith tobacco use history. Appreciate Dr. Teodoro Kil  input.   7. Diarrhea and abdominal pain,now followed by infectious disease  Recommendations: 1.Recommend following symptoms and electrolytes as well as bnp.  BNP is improved.  She does not appear to be in florid pulmonary edema at present . 2. Change to carvedilol 12.5 bid daily 3. Defer further cardiac diagnostics at this time. 4. Follow-up with Dr. Saralyn Pilar 1-2 weeks after discharge when stable.   Signed, Javier Docker Borden Thune MD 04/17/2020, 9:42 AM  Pager: [phone]

## 2020-04-17 NOTE — Progress Notes (Signed)
Martha [name]  MRN: [id number]  DOB/AGE: 1945/10/18 74 y.o.  Primary Care Physician:Hande, Cherlyn Labella, MD  Admit date: 04/03/2020  Chief Complaint:  Chief Complaint  Patient presents with  . Leg Swelling    S-Pt presented on  04/03/2020 with  Chief Complaint  Patient presents with  . Leg Swelling    Patient offers no specific complaints. Patient's daughter as always is present in the room.   Medications . amLODipine  10 mg Oral Daily  . vitamin C  1,000 mg Oral Daily  . carvedilol  12.5 mg Oral BID WC  . chlorhexidine  15 mL Mouth Rinse BID  . Chlorhexidine Gluconate Cloth  6 each Topical Daily  . enoxaparin (LOVENOX) injection  40 mg Subcutaneous Q24H  . feeding supplement (GLUCERNA SHAKE)  237 mL Oral TID BM  . fluticasone  1 spray Each Nare BID  . insulin aspart  0-5 Units Subcutaneous QHS  . insulin aspart  0-9 Units Subcutaneous TID WC  . insulin glargine  20 Units Subcutaneous Daily  . ipratropium-albuterol  3 mL Nebulization TID  . levothyroxine  150 mcg Oral Q0600  . losartan  100 mg Oral Daily  . mouth rinse  15 mL Mouth Rinse q12n4p  . multivitamin with minerals  1 tablet Oral Daily  . pantoprazole  40 mg Oral Daily  . [START ON 04/18/2020] predniSONE  30 mg Oral Q breakfast  . primidone  50 mg Oral BID  . rosuvastatin  5 mg Oral Daily  . sodium chloride flush  3 mL Intravenous Q12H  . vitamin B-12  1,000 mcg Oral Daily         [id number] from the symptoms mentioned above,there are no other symptoms referable to all systems reviewed.  Physical Exam: Vital signs in last 24 hours: Temp:  [97.8 F (36.6 C)-98.7 F (37.1 C)] 98.6 F (37 C) (12/19 1635) Pulse Rate:  [60-64] 64 (12/19 1635) Resp:  [19] 19 (12/19 1635) BP: (141-162)/(56-71) 141/56 (12/19 1635) SpO2:  [91 %-98 %] 93 % (12/19 1635) FiO2 (%):  [92 %] 92 % (12/19 1448) Weight:  [67 kg] 67 kg (12/19 0340) Weight change: -1.724 kg Last BM Date: 04/17/20  Intake/Output from previous  day: 12/18 0701 - 12/19 0700 In: 720 [P.O.:720] Out: 1772 [Urine:1770; Stool:2] No intake/output data recorded.   Physical Exam:  General- pt is awake,alert, oriented to time place and person  Resp- No acute REsp distress, decreased at bases  CVS- S1S2 regular in rate and rhythm  GIT- BS+, soft, Non tender , Non distended  EXT-bandages bilaterally on lower extremity, edema better,  No Cyanosis    Lab Results:  CBC  Recent Labs    04/16/20 0440 04/17/20 0354  WBC 10.6* 11.7*  HGB 13.3 13.4  HCT 42.0 41.4  PLT 188 222    BMET  Recent Labs    04/16/20 0440 04/17/20 0354  NA 137 132*  K 3.9 3.5  CL 95* 93*  CO2 33* 31  GLUCOSE 109* 94  BUN 27* 28*  CREATININE 0.42* 0.54  CALCIUM 8.6* 8.4*      Most recent Creatinine trend  Lab Results  Component Value Date   CREATININE 0.54 04/17/2020   CREATININE 0.42 (L) 04/16/2020   CREATININE 0.50 04/15/2020      MICRO   Recent Results (from the past 240 hour(s))  Urine Culture     Status: None   Collection Time: 04/08/20  9:52 AM   Specimen: Urine, Random  Result  Value Ref Range Status   Specimen Description   Final    URINE, RANDOM Performed at [facility], [address] [place], [place] [address]    Special Requests   Final    NONE Performed at [facility], [address] [place], [place] [address]    Culture   Final    NO GROWTH Performed at [facility], [place] [address] [place], [place] [address]    Report Status 04/09/2020 FINAL  Final  CULTURE, BLOOD (ROUTINE X 2) w Reflex to ID Panel     Status: None   Collection Time: 04/08/20 10:02 AM   Specimen: BLOOD  Result Value Ref Range Status   Specimen Description BLOOD BLOOD RIGHT HAND  Final   Special Requests   Final    BOTTLES DRAWN AEROBIC AND ANAEROBIC Blood Culture results may not be optimal due to an inadequate volume of blood received in culture bottles   Culture   Final    NO GROWTH 5 DAYS Performed at  [facility], [address] [place], [place] [address]    Report Status 04/13/2020 FINAL  Final  MRSA PCR Screening     Status: None   Collection Time: 04/08/20 10:04 AM   Specimen: Nasal Mucosa; Nasopharyngeal  Result Value Ref Range Status   MRSA by PCR NEGATIVE NEGATIVE Final    Comment:        The GeneXpert MRSA Assay (FDA approved for NASAL specimens only), is one component of a comprehensive MRSA colonization surveillance program. It is not intended to diagnose MRSA infection nor to guide or monitor treatment for MRSA infections. Performed at [facility], [address] [place], [place] [address]   CULTURE, BLOOD (ROUTINE X 2) w Reflex to ID Panel     Status: None   Collection Time: 04/08/20 10:26 AM   Specimen: BLOOD  Result Value Ref Range Status   Specimen Description BLOOD BLOOD RIGHT HAND  Final   Special Requests   Final    BOTTLES DRAWN AEROBIC AND ANAEROBIC Blood Culture results may not be optimal due to an inadequate volume of blood received in culture bottles   Culture   Final    NO GROWTH 5 DAYS Performed at [facility], [address], [place], [place] [address]    Report Status 04/13/2020 FINAL  Final  Urine Culture     Status: None   Collection Time: 04/10/20  6:21 PM   Specimen: Urine, Random  Result Value Ref Range Status   Specimen Description   Final    URINE, RANDOM Performed at [facility], [address] [place], [place] [address]    Special Requests   Final    NONE Performed at [facility], [address] [place], [place] [address]    Culture   Final    NO GROWTH Performed at [facility], [place] [address], [place], [place] [address]    Report Status 04/11/2020 FINAL  Final  MRSA PCR Screening     Status: None   Collection Time: 04/12/20  3:25 PM   Specimen: Nasopharyngeal  Result Value Ref Range Status   MRSA by PCR NEGATIVE NEGATIVE Final    Comment:        The GeneXpert  MRSA Assay (FDA approved for NASAL specimens only), is one component of a comprehensive MRSA colonization surveillance program. It is not intended to diagnose MRSA infection nor to guide or monitor treatment for  MRSA infections. Performed at [facility], [address] [place], [place] [address]          Impression:   74 y.o. female  Martha [name] is a 74 y.o. 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 At this time patient will not be able to cooperate and lie flat to get biopsy done.   Patient autoimmune work-up negative so far Results for ARIELIS, LEONHART (MRN [id number]) as of 04/16/2020 10:29  Ref. Range 04/04/2020 20:20  Anti Nuclear Antibody (ANA) Latest Ref Range: Negative  Negative  ANCA Proteinase 3 Latest Ref Range: 0.0 - 3.5 U/mL <3.5  GBM Ab Latest Ref Range: 0 - 20 units 3  Myeloperoxidase Abs Latest Ref Range: 0.0 - 9.0 U/mL <9.0  C3 Complement Latest Ref Range: 82 - 167 mg/dL 138  Complement C4, Body Fluid Latest Ref Range: 12 - 38 mg/dL 25   Results for AIDELIZ, GARMANY (MRN [id number]) as of 04/17/2020 16:58  Ref. Range 04/04/2020 20:20  M-SPIKE, % Latest Ref Range: Not Observed g/dL Not Observed   Results for JENNIKA, RINGGOLD (MRN [id number]) as of 04/17/2020 16:58  Ref. Range 04/05/2020 01:28  M Component, Ur Latest Ref Range: Not Observed % Not Observed    Patient ANCA profile has been negative/patient ANA was negative We will ask for  anti-PLA 2R antibody  2)HTN    Blood pressure is stable    3)Anemia none CBC Latest Ref Rng & Units 04/17/2020 04/16/2020 04/15/2020  WBC 4.0 - 10.5 K/uL 11.7(H) 10.6(H) 14.0(H)  Hemoglobin 12.0 - 15.0 g/dL 13.4 13.3 14.3  Hematocrit 36.0 - 46.0 % 41.4 42.0 45.6  Platelets 150 - 400 K/uL 222 188 180       HGb at goal (9--11)   4)anasarca Patient has anasarca secondary to nephrotic syndrome Possible contribution from acute on chronic diastolic CHF Patient is currently on diuretics-patient earlier was on IV Lasix infusion now patient is getting Lasix 40 mg IV daily/as needed basis as per cardiology  Patient is negative by nearly 16.5 L since admission    5) diabetes mellitus type 2 Insulin closely followed the primary team  6) Electrolytes   BMP Latest Ref Rng & Units 04/17/2020 04/16/2020 04/15/2020  Glucose 70 - 99 mg/dL 94 109(H) 98  BUN 8 - 23 mg/dL 28(H) 27(H) 26(H)  Creatinine 0.44 - 1.00 mg/dL 0.54 0.42(L) 0.50  Sodium 135 - 145 mmol/L 132(L) 137 142  Potassium 3.5 - 5.1 mmol/L 3.5 3.9 4.4  Chloride 98 - 111 mmol/L 93(L) 95(L) 101  CO2 22 - 32 mmol/L 31 33(H) 31  Calcium 8.9 - 10.3 mg/dL 8.4(L) 8.6(L) 9.2     Sodium Hyponatremia We will follow   Potassium Normokalemic    7)Acid base    Co2 at goal     Plan:    I did ask for Anti PLA2 R antibody-yesterday Miscellaneous [facility] test (send-out) (Order [id number])  We will continue the current treatment       Gayathri Futrell s Rasheda Ledger 04/17/2020, 4:59 PM

## 2020-04-17 NOTE — Progress Notes (Signed)
PROGRESS NOTE    Martha [name]  XVQ:[id number] DOB: 08/08/1945 DOA: 04/03/2020 PCP: Tracie Harrier, MD   Brief Narrative: Martha [name] is a 74 y.o. female with history of hypertension, hyperlipidemia, diabetes mellitus, COPD, hypothyroidism, gout, tobacco abuse, depression/anxiety, GERD, diverticulitis.  Patient presented secondary to bilateral leg edema and dyspnea and was found to have evidence of respiratory failure requiring BiPAP.  Concern for acute heart failure initially started on IV Lasix.  Complicated by likely nephrotic syndrome, COPD exacerbation in addition to aspiration pneumonia.  Patient is unwilling to 5 L of oxygen via nasal cannula   Assessment & Plan:   Principal Problem:   Acute CHF (congestive heart failure) (HCC) Active Problems:   COPD exacerbation (HCC)   Diabetes mellitus without complication ([place])   Current tobacco use   Hyperlipidemia   Essential hypertension   Hypothyroidism   Gout   Depression   Tremors of nervous system   Acute respiratory failure with hypoxia (HCC)   Cellulitis of lower extremity   Acute pulmonary edema (HCC)   Elevated brain natriuretic peptide (BNP) level   Nephrotic syndrome   Acute respiratory failure with hypoxia and hypercapnia Multifactorial in setting of acute on chronic diastolic heart failure, COPD exacerbation, aspiration pneumonia.  Patient required as much as 10 L/min and 60% FiO2 via high flow nasal cannula in addition to be managed with BiPAP.  She has been patient is improved with treatment below.  Acute on chronic diastolic heart failure Patient managed with aggressive IV diuresis which included Lasix drip from 12/6 until 12/10.  Patient was then transitioned to IV Lasix intermittently secondary to initial overdiuresis.  Echo from 12/6 significant for an EF of 55 to 60% with grade 1 diastolic dysfunction.  Cardiology was consulted with recommendations for outpatient follow-up. Weight on admission of 82.5  kg. Weight today of 67 kg. UOP over the last 24 hours of 1.8 L with total I/O of -16.5 L -Continue Coreg and losartan; discontinue metoprolol on discharge  COPD exacerbation Patient managed with IV Solu-Medrol, DuoNeb.  Currently on a prednisone taper. -Pulmonology recommendations: Prednisone taper  Metabolic alkalosis Appears to be secondary to contraction alkalosis from diuresis.  Patient with compensatory hypercapnia.  Alkalosis is improving with decrease of Lasix therapy.  CO2 on BMP is normal.  Acute urinary retention Unknown etiology. Foley placed on 12/14 for recurrent retention. Urinalysis not suggestive of infection and urine culture with no growth. -Remove foley; voiding trial today  Primary hypertension -Continue amlodipine, Coreg, losartan  Dysphagia In setting of aspiration pneumonia. Evaluated by speech therapy with recommendation for dysphagia 2 diet.  Hyperbilirubinemia Transient in setting of fluid overload. Abdominal ultrasound significant for possible early liver disease. Resolved.  Nephrotic syndrome Nephrology consulted.  Urinalysis significant for large proteinuria.  Normal albumin.  Patient initially managed with IV Lasix which is now been discontinued.  Patient received intermittent Lasix as mentioned above.  Initial plan for biopsy which was unable to be performed secondary to patient's inability to tolerate biopsy procedure. -Nephrology recommendations  Bilateral peripheral venous stasis Unna boots applied.  Hypokalemia Repleted.  In setting of IV diuresis.  Diabetes mellitus, type II Patient is on metformin and glipizide as an outpatient.  Managed on Lantus 20 units in addition to sliding scale while inpatient. -Continue Lantus 20 units and sliding scale insulin  Hypothyroidism -Continue Synthroid 150 mcg daily  Hyperlipidemia -Continue Crestor 5 mg daily   DVT prophylaxis: Lovenox Code Status:   Code Status: Full Code Family  Communication:  Daughter at bedside Disposition Plan: Discharge to SNF likely in 1-3 days pending final recommendations on diuresis, voiding trial, therapy recommendations   Consultants:   Cardiology  Infectious disease  Nephrology  Pulmonology  Procedures:   TRANSTHORACIC ECHOCARDIOGRAM (04/04/2020) IMPRESSIONS    1. Left ventricular ejection fraction, by estimation, is 55 to 60%. The  left ventricle has normal function. The left ventricle has no regional  wall motion abnormalities. Left ventricular diastolic parameters are  consistent with Grade I diastolic  dysfunction (impaired relaxation).  2. Right ventricular systolic function is normal. The right ventricular  size is mildly enlarged.  3. Left atrial size was mildly dilated.  4. Right atrial size was mildly dilated.  5. The mitral valve was not well visualized. Trivial mitral valve  regurgitation.  6. The aortic valve was not well visualized. Aortic valve regurgitation  is trivial.   Antimicrobials:  Ceftriaxone IV  Keflex PO  Zosyn IV  Vancomycin IV  Unasyn IV    Subjective: No concerns this morning. Daughter is worried about her sleeping a lot. She reports not seeing physical therapy for several days.  Objective: Vitals:   04/17/20 0340 04/17/20 0422 04/17/20 0733 04/17/20 0803  BP:  (!) 162/71  (!) 157/65  Pulse:  61  60  Resp:    19  Temp:  98.6 F (37 C)  98.7 F (37.1 C)  TempSrc:  Oral  Oral  SpO2:  91% 93% 98%  Weight: 67 kg     Height:        Intake/Output Summary (Last 24 hours) at 04/17/2020 1055 Last data filed at 04/17/2020 0945 Gross per 24 hour  Intake 0 ml  Output 1772 ml  Net -1772 ml   Filed Weights   04/15/20 0439 04/16/20 0448 04/17/20 0340  Weight: 68.2 kg 68.7 kg 67 kg    Examination:  General exam: Appears calm and comfortable Respiratory system: Diminished. Respiratory effort normal. Cardiovascular system: S1 & S2 heard, RRR. No murmurs, rubs, gallops or  clicks. Gastrointestinal system: Abdomen is nondistended, soft and nontender. No organomegaly or masses felt. Normal bowel sounds heard. Central nervous system: Asleep but arouses easily and is oriented. No focal neurological deficits. Musculoskeletal: 1-2+ thigh edema. No calf tenderness. Lower legs wrapped in Unna boots Skin: No cyanosis. No rashes Psychiatry: Judgement and insight appear normal. Mood & affect appropriate.     Data Reviewed: I have personally reviewed following labs and imaging studies  CBC Lab Results  Component Value Date   WBC 11.7 (H) 04/17/2020   RBC 4.74 04/17/2020   HGB 13.4 04/17/2020   HCT 41.4 04/17/2020   MCV 87.3 04/17/2020   MCH 28.3 04/17/2020   PLT 222 04/17/2020   MCHC 32.4 04/17/2020   RDW 15.6 (H) 04/17/2020   LYMPHSABS 0.6 (L) 04/03/2020   MONOABS 0.5 04/03/2020   EOSABS 0.1 04/03/2020   BASOSABS 0.1 02/72/5366     Last metabolic panel Lab Results  Component Value Date   NA 132 (L) 04/17/2020   K 3.5 04/17/2020   CL 93 (L) 04/17/2020   CO2 31 04/17/2020   BUN 28 (H) 04/17/2020   CREATININE 0.54 04/17/2020   GLUCOSE 94 04/17/2020   GFRNONAA >60 04/17/2020   GFRAA >60 11/05/2018   CALCIUM 8.4 (L) 04/17/2020   PROT 6.0 (L) 04/15/2020   ALBUMIN 3.5 04/15/2020   LABGLOB 3.3 04/04/2020   AGRATIO 0.9 04/04/2020   BILITOT 1.2 04/15/2020   ALKPHOS 150 (H) 04/15/2020   AST  27 04/15/2020   ALT 44 04/15/2020   ANIONGAP 8 04/17/2020    CBG (last 3)  Recent Labs    04/16/20 2024 04/16/20 2141 04/17/20 0805  GLUCAP 283* 222* 134*     GFR: Estimated Creatinine Clearance: 57.2 mL/min (by C-G formula based on SCr of 0.54 mg/dL).  Coagulation Profile: No results for input(s): INR, PROTIME in the last 168 hours.  Recent Results (from the past 240 hour(s))  Urine Culture     Status: None   Collection Time: 04/08/20  9:52 AM   Specimen: Urine, Random  Result Value Ref Range Status   Specimen Description   Final    URINE,  RANDOM Performed at [facility], [address], [place], [place] [address]    Special Requests   Final    NONE Performed at [facility], [address] [place], [place] [address]    Culture   Final    NO GROWTH Performed at [facility], [place] [address] [place], [place] [address]    Report Status 04/09/2020 FINAL  Final  CULTURE, BLOOD (ROUTINE X 2) w Reflex to ID Panel     Status: None   Collection Time: 04/08/20 10:02 AM   Specimen: BLOOD  Result Value Ref Range Status   Specimen Description BLOOD BLOOD RIGHT HAND  Final   Special Requests   Final    BOTTLES DRAWN AEROBIC AND ANAEROBIC Blood Culture results may not be optimal due to an inadequate volume of blood received in culture bottles   Culture   Final    NO GROWTH 5 DAYS Performed at [facility], [address] [place], [place] [address]    Report Status 04/13/2020 FINAL  Final  MRSA PCR Screening     Status: None   Collection Time: 04/08/20 10:04 AM   Specimen: Nasal Mucosa; Nasopharyngeal  Result Value Ref Range Status   MRSA by PCR NEGATIVE NEGATIVE Final    Comment:        The GeneXpert MRSA Assay (FDA approved for NASAL specimens only), is one component of a comprehensive MRSA colonization surveillance program. It is not intended to diagnose MRSA infection nor to guide or monitor treatment for MRSA infections. Performed at [facility], [address] [place], [place] [address]   CULTURE, BLOOD (ROUTINE X 2) w Reflex to ID Panel     Status: None   Collection Time: 04/08/20 10:26 AM   Specimen: BLOOD  Result Value Ref Range Status   Specimen Description BLOOD BLOOD RIGHT HAND  Final   Special Requests   Final    BOTTLES DRAWN AEROBIC AND ANAEROBIC Blood Culture results may not be optimal due to an inadequate volume of blood received in culture bottles   Culture   Final    NO GROWTH 5 DAYS Performed at [facility], [address]  [place], [place] [address]    Report Status 04/13/2020 FINAL  Final  Urine Culture     Status: None   Collection Time: 04/10/20  6:21 PM   Specimen: Urine, Random  Result Value Ref Range Status   Specimen Description   Final    URINE, RANDOM Performed at [facility], [address], [place],  [address]    Special Requests   Final    NONE Performed at [facility], [address] [place],  [address]    Culture   Final    NO GROWTH Performed at [facility], [place] [address] [place], [place] [address]    Report Status 04/11/2020 FINAL  Final  MRSA PCR Screening     Status: None   Collection Time: 04/12/20  3:25 PM   Specimen: Nasopharyngeal  Result Value Ref Range Status   MRSA by PCR NEGATIVE NEGATIVE Final    Comment:        The GeneXpert MRSA Assay (FDA approved for NASAL specimens only), is one component of a comprehensive MRSA colonization surveillance program. It is not intended to diagnose MRSA infection nor to guide or monitor treatment for MRSA infections. Performed at [facility], [address] [place], [place] [address]         Radiology Studies: No results found.      Scheduled Meds: . amLODipine  10 mg Oral Daily  . vitamin C  1,000 mg Oral Daily  . carvedilol  12.5 mg Oral BID WC  . chlorhexidine  15 mL Mouth Rinse BID  . Chlorhexidine Gluconate Cloth  6 each Topical Daily  . enoxaparin (LOVENOX) injection  40 mg Subcutaneous Q24H  . feeding supplement (GLUCERNA SHAKE)  237 mL Oral TID BM  . fluticasone  1 spray Each Nare BID  . insulin aspart  0-5 Units Subcutaneous QHS  . insulin aspart  0-9 Units Subcutaneous TID WC  . insulin glargine  20 Units Subcutaneous Daily  . ipratropium-albuterol  3 mL Nebulization TID  . levothyroxine  150 mcg Oral Q0600  . losartan  100 mg Oral Daily  . mouth rinse  15 mL Mouth Rinse q12n4p  . multivitamin with minerals  1 tablet Oral Daily  . pantoprazole  40  mg Oral Daily  . predniSONE  40 mg Oral Q breakfast  . primidone  50 mg Oral BID  . rosuvastatin  5 mg Oral Daily  . sertraline  50 mg Oral Daily  . sodium chloride flush  3 mL Intravenous Q12H  . vitamin B-12  1,000 mcg Oral Daily   Continuous Infusions: . sodium chloride 250 mL (04/15/20 1256)     LOS: 14 days     Cordelia Poche, MD [facility] 04/17/2020, 10:55 AM  If 7PM-7AM, please contact night-coverage [email or web address]

## 2020-04-17 NOTE — Progress Notes (Signed)
Pulmonary and Critical Care  Medicine          Date: 04/17/2020,   MRN# [id number] Martha [name] [birth year]     AdmissionWeight: 72 kg                 CurrentWeight: 2 kg  Referring physician: Dr. Si Raider    CHIEF COMPLAINT:   Acute on chronic hypoxemic respiratory failure   HISTORY OF PRESENT ILLNESS   74 year old female with history of diabetes COPD lifelong smoking hypothyroidism gout major depressive disorder, anxiety disorder and GERD, diverticulitis who came in with signs and symptoms of anasarca including peripheral edema and pulmonary edema.  Patient states is been worse than usual despite compliance with wrapping the lower extremities.  She also reported coughing and dyspnea.  Upon presentation to the emergency room she was found to be desaturating in the mid 80s and was placed on 4 L supplemental oxygen with some subsequent improvement of SPO2.  She was then treated with BiPAP and felt better.  She was initially admitted with acute exacerbation of CHF and cardiology was consulted for this.  She was also found to have proteinuria with CKD and had renal consultation placed. Pulmonary consultation placed for acute on chronic hypoxemic respiratory failure with failure to wean down oxygen.  Patient did have CT PE protocol done to rule out acute pulmonary venous embolism which was negative however did show bullous emphysema with overlying pulmonary edema as well as a right lower lobe consolidated infiltrate suggestive of pneumonia as well as right hemidiaphragm elevation due to hepatomegaly.  04/09/20- patient seen at bedside, remains encephalopathic.  Discussed with [facility] attending Dr Si Raider plan for transfer to SDU.     04/10/20- patient is improved.  She is more awake, she was able to speak with me despite BIPAP mask, she has normal 4/4 grip bilaterally and moves LE to verbal communication without encouragment. Mentation has significantly improved. She is flushed red on  examination.    04/11/20- patient is improved.  She is off BIPAP during my evaluation.  She is able to speak and reported right upper extermity pain, this may be related to K infusion. I dicussed this with RN who has already addressed this with IV team. ABG is in progress. Cardiology and nephrology following, UOP is low.   04/13/20- patient is improved.  She is speaking in full sentences. I spoke to her common law husband to review hospital course and care plan.  Patient will need BIPAP for home, ive discussed case with [facility] team and qualification for NIV is in process.  Spirometry is in process. 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  She smokes actively , I have provided smoking cessation counseling today and will be following up on outpatient for cessation therapy.  LE swelling is resolved and sensorium is close to baseline. There is no diarreah  04/14/20- patient resting in bed. Mildly confused. Breathing is diminished bilaterllly , on 8L HF bubbler. ABG tonight.   04/15/20- patient is resting in bed. Vitals are stable.  Seems patient may have been more confused after Haldol but not from ventilation as her ABG is without hypercapnia.  Nocturnal pulse oximetry is pending for home BIPAP  04/16/20- Daughter present during my evaluation.  She shares patient was smoking 2.5 packs daily and recently has been trying to stop. We discussed BIPAP for home.  Reviewed nocturnal pulse oximetry today with mild desaturation overnight. Patient is down to 5L/min [place] and improved.  04/17/20-  Patient is improved shes down to 5L/min.  She has thus far lost appx 35lbs since admission due to fluid overload. Her CO2 is improved and repeat CXR with interval improvement of interstitial infiltrates more notable on right side. Shes slow to improve due to advanced age.    PAST MEDICAL HISTORY   Past Medical History:  Diagnosis Date   Abnormal LFTs (liver function tests)    Anxiety    Arthritis     knees, feet   Asthma    Chronic bronchitis (HCC)    Chronic constipation    Chronic low back pain    Cigarette smoker    Has cut back Smoking to 1 pack every other day   COPD (chronic obstructive pulmonary disease) ([place])    Depression    Diabetic neuropathy ([place])    Diverticulitis 2015   gi recommended repeat scope in 10 years   Essential hypertension    Fatty liver    GERD (gastroesophageal reflux disease)    Gout    History of mammogram 05/28/2013   Hyperlipidemia    Hypothyroidism    Insomnia    Iron deficiency anemia    Low serum vitamin D    Personal history of tobacco use, presenting hazards to health 09/29/2015   Plantar fasciitis    RLS (restless legs syndrome)    Shortness of breath dyspnea    Thyroid cancer ([place]) 2009   Tremors of nervous system    "I think I have parkinson's disease"   Type 2 diabetes mellitus ([place])    Wears dentures    uppers     SURGICAL HISTORY   Past Surgical History:  Procedure Laterality Date   CATARACT EXTRACTION W/PHACO Left 11/24/2015   Procedure: CATARACT EXTRACTION PHACO AND INTRAOCULAR LENS PLACEMENT ([place]);  Surgeon: Birder Robson, MD;  Location: [facility] ORS;  Service: Ophthalmology;  Laterality: Left;  [place] 38.3 AP% 20.6 CDE 7.89 FLUID PACK LOT # [id number]   CATARACT EXTRACTION W/PHACO Right 06/23/2019   Procedure: CATARACT EXTRACTION PHACO AND INTRAOCULAR LENS PLACEMENT ([place]) RIGHT DIABETIC;  Surgeon: Birder Robson, MD;  Location: [place];  Service: Ophthalmology;  Laterality: Right;  Diabetic   COLONOSCOPY  2004, 2009, 2015   [place]   POLYPECTOMY  2009   THYROIDECTOMY  2009   states she was tx with radiactive iodine   TUBAL LIGATION     UPPER GASTROINTESTINAL ENDOSCOPY       FAMILY HISTORY   Family History  Problem Relation Age of Onset   Diabetes Mellitus II Father    Lung disease Father    Diabetes Mellitus II Mother    Arthritis Mother    Thyroid disease Other     Asthma Sister    Emphysema Sister    Hypertension Sister    Breast cancer Paternal Aunt      SOCIAL HISTORY   Social History   Tobacco Use   Smoking status: Current Every Day Smoker    Packs/day: 1.00    Years: 39.50    Pack years: 39.50    Types: Cigarettes   Smokeless tobacco: Never Used   Tobacco comment: curently smokes 12 cigerattes a day. I'm trying to cut back.  Vaping Use   Vaping Use: Never used  Substance Use Topics   Alcohol use: No    Alcohol/week: 0.0 standard drinks   Drug use: No     MEDICATIONS    Home Medication:    Current Medication:  Current Facility-Administered Medications:  0.9 %  sodium chloride infusion, 250 mL, Intravenous, PRN, Ivor Costa, MD, Last Rate: 5 mL/hr at 04/15/20 1256, 250 mL at 04/15/20 1256   acetaminophen (TYLENOL) suppository 650 mg, 650 mg, Rectal, Q6H PRN, Lang Snow, NP, 650 mg at 04/08/20 2234   acetaminophen (TYLENOL) tablet 650 mg, 650 mg, Oral, Q6H PRN, Lorella Nimrod, MD, 650 mg at 04/15/20 2212   albuterol (PROVENTIL) (2.5 MG/3ML) 0.083% nebulizer solution 2.5 mg, 2.5 mg, Nebulization, Q4H PRN, Ivor Costa, MD   amLODipine (NORVASC) tablet 10 mg, 10 mg, Oral, Daily, Wouk, Ailene Rud, MD, 10 mg at 04/17/20 1052   ascorbic acid (VITAMIN C) tablet 1,000 mg, 1,000 mg, Oral, Daily, Ivor Costa, MD, 1,000 mg at 04/17/20 1049   calcium carbonate (TUMS - dosed in mg elemental calcium) chewable tablet 200 mg of elemental calcium, 1 tablet, Oral, Q8H PRN, Nicole Kindred A, DO, 200 mg of elemental calcium at 04/14/20 1824   carvedilol (COREG) tablet 12.5 mg, 12.5 mg, Oral, BID WC, Teodoro Spray, MD   chlorhexidine (PERIDEX) 0.12 % solution 15 mL, 15 mL, Mouth Rinse, BID, Wouk, Ailene Rud, MD, 15 mL at 04/17/20 1053   Chlorhexidine Gluconate Cloth 2 % PADS 6 each, 6 each, Topical, Daily, Wouk, Ailene Rud, MD, 6 each at 04/17/20 1053   dextromethorphan-guaiFENesin (MUCINEX DM) 30-600 MG  per 12 hr tablet 1 tablet, 1 tablet, Oral, BID PRN, Ivor Costa, MD, 1 tablet at 04/06/20 0945   enoxaparin (LOVENOX) injection 40 mg, 40 mg, Subcutaneous, Q24H, Nicole Kindred A, DO, 40 mg at 04/16/20 2154   feeding supplement (GLUCERNA SHAKE) (GLUCERNA SHAKE) liquid 237 mL, 237 mL, Oral, TID BM, Griffith, Kelly A, DO, 115 mL at 04/17/20 1054   fluticasone (FLONASE) 50 MCG/ACT nasal spray 1 spray, 1 spray, Each Nare, BID, Sharion Settler, NP, 1 spray at 04/17/20 1048   fluticasone (FLONASE) 50 MCG/ACT nasal spray 1-2 spray, 1-2 spray, Each Nare, Daily PRN, Ivor Costa, MD   insulin aspart (novoLOG) injection 0-5 Units, 0-5 Units, Subcutaneous, QHS, Ivor Costa, MD, 2 Units at 04/16/20 2152   insulin aspart (novoLOG) injection 0-9 Units, 0-9 Units, Subcutaneous, TID WC, Ivor Costa, MD, 1 Units at 04/17/20 1047   insulin glargine (LANTUS) injection 20 Units, 20 Units, Subcutaneous, Daily, Wouk, Ailene Rud, MD, 20 Units at 04/17/20 1047   ipratropium-albuterol (DUONEB) 0.5-2.5 (3) MG/3ML nebulizer solution 3 mL, 3 mL, Nebulization, TID, Ivor Costa, MD, 3 mL at 04/17/20 0733   labetalol (NORMODYNE) injection 20 mg, 20 mg, Intravenous, Q2H PRN, Wouk, Ailene Rud, MD, 20 mg at 04/15/20 1252   levothyroxine (SYNTHROID) tablet 150 mcg, 150 mcg, Oral, Q0600, Ivor Costa, MD, 150 mcg at 04/16/20 0938   losartan (COZAAR) tablet 100 mg, 100 mg, Oral, Daily, Wouk, Ailene Rud, MD, 100 mg at 04/17/20 1049   MEDLINE mouth rinse, 15 mL, Mouth Rinse, q12n4p, Wouk, Ailene Rud, MD, 15 mL at 04/16/20 1650   multivitamin with minerals tablet 1 tablet, 1 tablet, Oral, Daily, Nicole Kindred A, DO, 1 tablet at 04/17/20 1048   nitroGLYCERIN (NITROSTAT) SL tablet 0.4 mg, 0.4 mg, Sublingual, Q5 min PRN, Wouk, Ailene Rud, MD, 0.4 mg at 04/08/20 0659   ondansetron [facility]) injection 4 mg, 4 mg, Intravenous, Q8H PRN, Ivor Costa, MD, 4 mg at 04/15/20 1100   pantoprazole (PROTONIX) EC tablet 40 mg, 40 mg,  Oral, Daily, Nicole Kindred A, DO, 40 mg at 04/17/20 1048   predniSONE (DELTASONE) tablet 40 mg, 40 mg, Oral, Q breakfast, Jakaree Pickard,  Luvenia Heller, MD, 40 mg at 04/17/20 1122   primidone (MYSOLINE) tablet 50 mg, 50 mg, Oral, BID, Nicole Kindred A, DO, 50 mg at 04/17/20 1048   prochlorperazine (COMPAZINE) injection 10 mg, 10 mg, Intravenous, Q6H PRN, Nicole Kindred A, DO, 10 mg at 04/15/20 1729   rosuvastatin (CRESTOR) tablet 5 mg, 5 mg, Oral, Daily, Ivor Costa, MD, 5 mg at 04/17/20 1052   sertraline (ZOLOFT) tablet 50 mg, 50 mg, Oral, Daily, Nicole Kindred A, DO, 50 mg at 04/16/20 0914   sodium chloride flush (NS) 0.9 % injection 3 mL, 3 mL, Intravenous, Q12H, Ivor Costa, MD, 3 mL at 04/17/20 1051   sodium chloride flush (NS) 0.9 % injection 3 mL, 3 mL, Intravenous, PRN, Ivor Costa, MD   vitamin B-12 (CYANOCOBALAMIN) tablet 1,000 mcg, 1,000 mcg, Oral, Daily, Ivor Costa, MD, 1,000 mcg at 04/17/20 1051    ALLERGIES   Nicotine polacrilex, Nicotrol [nicotine], and Codeine sulfate     REVIEW OF SYSTEMS    Review of Systems:  Unable to obtain due to encephalopathy   VS: BP (!) 147/62 (BP Location: Right Arm)    Pulse 64    Temp 97.8 F (36.6 C) (Oral)    Resp 19    Ht _0  (1.626 m)    Wt 67 kg    SpO2 92%    BMI 25.35 kg/m      PHYSICAL EXAM    GENERAL:NAD, no fevers, chills, no weakness no fatigue HEAD: Normocephalic, atraumatic.  EYES: Pupils equal, round, reactive to light. Extraocular muscles intact. No scleral icterus.  MOUTH: dry mucosal membrane. Dentition intact. No abscess noted. On BIPAP EAR, NOSE, THROAT: Clear without exudates. No external lesions.  NECK: Supple. No thyromegaly. No nodules. No JVD.  PULMONARY: mild rhonchi bilaterally  CARDIOVASCULAR: S1 and S2. Regular rate and rhythm. No murmurs, rubs, or gallops. No edema. Pedal pulses 2+ bilaterally.  GASTROINTESTINAL: Soft, nontender, nondistended. No masses. Positive bowel sounds. No hepatosplenomegaly.   MUSCULOSKELETAL: No swelling, clubbing, +edema 2++.  NEUROLOGIC: GCS 10  SKIN: No ulceration, lesions, rashes, or cyanosis. Skin warm and dry. Turgor intact.       IMAGING    DG Chest 1 View  Result Date: 04/09/2020 CLINICAL DATA:  Acute respiratory failure EXAM: CHEST  1 VIEW COMPARISON:  04/03/2016 FINDINGS: Normal cardiac silhouette. Interval increase in interstitial edema pattern. No focal consolidation. No pneumothorax. IMPRESSION: Worsening interstitial edema. Electronically Signed   By: Suzy Bouchard M.D.   On: 04/09/2020 06:31   DG Chest 2 View  Result Date: 04/03/2020 CLINICAL DATA:  Hypoxia. Leg edema and shortness of breath. Pt daughter states that pt is supposed to be getting a renal bx but they are not able to do it because of the edema in pts legs. Pt states that she does feel short of breath. Pt does not wear oxygen at home. Pt placed on O2 in triage due to sats being in the 80s. Hx of asthma, chronic bronchitis, COPD, thyroid cancer- 2009, DM, current smoker. EXAM: CHEST - 2 VIEW COMPARISON:  01/04/2018 FINDINGS: Cardiac silhouette is borderline enlarged. Stable aortic atherosclerotic calcifications. No mediastinal or hilar masses. No evidence of adenopathy. Lungs demonstrate diffuse bilateral irregular interstitial thickening which has increased compared to the prior study. There is also linear/reticular scarring in the left lung base, which is stable. No lung consolidation. No pleural effusion and no pneumothorax. Skeletal structures are demineralized, but grossly intact. IMPRESSION: 1. Bilateral irregular interstitial thickening, increased compared to the prior study,  along with borderline cardiomegaly. Suspect mild congestive heart failure. Consider diffuse interstitial infection or inflammation in the proper clinical setting. No evidence of lobar pneumonia. Electronically Signed   By: Lajean Manes M.D.   On: 04/03/2020 13:06   CT CHEST W CONTRAST  Result Date:  04/06/2020 CLINICAL DATA:  Hemoptysis. EXAM: CT CHEST WITH CONTRAST TECHNIQUE: Multidetector CT imaging of the chest was performed during intravenous contrast administration. CONTRAST:  86m OMNIPAQUE IOHEXOL 300 MG/ML  SOLN COMPARISON:  January 06, 2019 FINDINGS: Cardiovascular: There is marked severity calcification of the aortic arch. There is mild cardiomegaly. No pericardial effusion. Mediastinum/Nodes: There is mild pretracheal and right hilar lymphadenopathy. Thyroid gland, trachea, and esophagus demonstrate no significant findings. Lungs/Pleura: Mild-to-moderate severity emphysematous lung disease is seen involving the bilateral upper lobes with stable bullous disease is seen along the medial aspect of the left apex. Mild to moderate severity areas of atelectasis and/or infiltrate are seen within the posterior aspects of the bilateral upper lobes and left lung base. Moderate to marked severity posterior right basilar atelectasis and/or infiltrate is noted. A very small right pleural effusion is seen. No pneumothorax is identified. Upper Abdomen: There is stable hepatosplenomegaly with stable, moderate severity diffuse bilateral adrenal gland enlargement. Musculoskeletal: A chronic lateral eighth left rib fracture is seen. A chronic compression fracture deformity of the T12 vertebral body is noted. IMPRESSION: 1. Mild-to-moderate severity posterior bilateral upper lobe and left basilar atelectasis and/or infiltrate. 2. Moderate to marked severity posterior right basilar atelectasis and/or infiltrate. 3. Very small right pleural effusion. 4. Mild cardiomegaly. 5. Stable emphysematous lung disease. 6. Chronic lateral eighth left rib fracture. 7. Chronic compression fracture deformity of the T12 vertebral body. 8. Aortic atherosclerosis. Aortic Atherosclerosis (ICD10-I70.0) and Emphysema (ICD10-J43.9). Electronically Signed   By: TVirgina NorfolkM.D.   On: 04/06/2020 15:33   CT ANGIO CHEST PE W OR WO  CONTRAST  Result Date: 04/07/2020 CLINICAL DATA:  Shortness of breath EXAM: CT ANGIOGRAPHY CHEST WITH CONTRAST TECHNIQUE: Multidetector CT imaging of the chest was performed using the standard protocol during bolus administration of intravenous contrast. Multiplanar CT image reconstructions and MIPs were obtained to evaluate the vascular anatomy. CONTRAST:  7102mOMNIPAQUE IOHEXOL 350 MG/ML SOLN COMPARISON:  April 06, 2020 FINDINGS: Cardiovascular: There is a optimal opacification of the pulmonary arteries. There is no central,segmental, or subsegmental filling defects within the pulmonary arteries. There is unchanged cardiomegaly. Mitral valve calcifications and coronary artery calcifications are seen. No pericardial effusion or thickening. No evidence right heart strain. There is normal three-vessel brachiocephalic anatomy without proximal stenosis. Scattered aortic atherosclerosis is noted. Mediastinum/Nodes: No hilar, mediastinal, or axillary adenopathy. Thyroid gland, trachea, and esophagus demonstrate no significant findings. Lungs/Pleura: Patchy airspace opacity seen at the left upper lung and posterior right lung base with air bronchograms. There are trace bilateral pleural effusions present. Centrilobular and paraseptal emphysematous changes seen at both lung apices. Upper Abdomen: No acute abnormalities present in the visualized portions of the upper abdomen. Musculoskeletal: No chest wall abnormality. No acute or significant osseous findings. There is a chronic anterior wedge compression deformity of the T12 vertebral body with 50% loss in vertebral body height. Review of the MIP images confirms the above findings. IMPRESSION: No central, segmental, or subsegmental pulmonary embolism. Unchanged multifocal patchy airspace opacities within both lungs which could be due to multifocal pneumonia Trace bilateral pleural effusions, right greater than left. Aortic Atherosclerosis (ICD10-I70.0). Electronically  Signed   By: BiPrudencio Pair.D.   On: 04/07/2020 19:43  CT CHEST ABDOMEN PELVIS W CONTRAST  Result Date: 04/12/2020 CLINICAL DATA:  Shortness of breath, abdominal pain, and bilateral leg swelling. EXAM: CT CHEST, ABDOMEN, AND PELVIS WITH CONTRAST TECHNIQUE: Multidetector CT imaging of the chest, abdomen and pelvis was performed following the standard protocol during bolus administration of intravenous contrast. CONTRAST:  138m OMNIPAQUE IOHEXOL 300 MG/ML  SOLN COMPARISON:  Right upper quadrant ultrasound from yesterday. CTA chest dated April 07, 2020. FINDINGS: CT CHEST FINDINGS Cardiovascular: Unchanged borderline cardiomegaly. No pericardial effusion. No thoracic aortic aneurysm or dissection. Coronary, aortic arch, and branch vessel atherosclerotic vascular disease. 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 No central pulmonary embolism. Mediastinum/Nodes: Unchanged mildly enlarged precarinal lymph node measuring 12 mm in short axis, stable since September 2020, likely reactive. No enlarged hilar or axillary lymph nodes. Prior thyroidectomy. Prominent circumferential wall thickening of the proximal and mid esophagus, slightly worsened since the prior study. Lungs/Pleura: Slightly increased small right pleural effusion. New trace left pleural effusion. Prominent atelectasis within the right lower lobe with patchy areas of non enhancement and air bronchograms consistent with underlying pneumonia, slightly worsened when compared to prior study. Increased patchy consolidation in both posterior upper lobes. Minimal subsegmental atelectasis in the left lower lobe. Moderate centrilobular and mild paraseptal emphysema again noted with bullous changes in the medial left lung apex. No pneumothorax. 7 mm subpleural nodule in the left upper lobe (series 4, image 39), similar to most recent chest CTs, but slightly more  conspicuous when compared to CT chest from September 2020. Musculoskeletal: No acute or significant osseous findings. Unchanged chronic T12 compression deformity. Old left eighth rib fracture. CT ABDOMEN PELVIS FINDINGS Hepatobiliary: Slightly lobular liver contour. No focal liver abnormality is seen. No gallstones, gallbladder wall thickening, or biliary dilatation. Pancreas: Unremarkable. No pancreatic ductal dilatation or surrounding inflammatory changes. Spleen: Normal in size without focal abnormality. Adrenals/Urinary Tract: Chronic bilateral adrenal hypertrophy. No renal calculi, focal lesion, or hydronephrosis. The bladder is unremarkable. Stomach/Bowel: Stomach is within normal limits. Appendix appears normal. No evidence of bowel wall thickening, distention, or inflammatory changes. Prominent sigmoid colonic diverticulosis. Vascular/Lymphatic: Aortic atherosclerosis. 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 No enlarged abdominal or pelvic lymph nodes. Reproductive: Uterus and bilateral adnexa are unremarkable. Other: Scattered trace ascites. No pneumoperitoneum. Small fat containing paraumbilical hernias. Musculoskeletal: No acute or significant osseous findings. IMPRESSION: CT chest: 1. Mildly worsened multifocal pneumonia. 2. Slightly increased small right pleural effusion. New trace left pleural effusion. 3. 7 mm subpleural nodule in the left upper lobe, similar to most recent chest CTs, but slightly more conspicuous when compared to CT chest from September 2020. While this may be infectious or inflammatory, attention on follow-up imaging is recommended. 4. Prominent circumferential wall thickening of the proximal and mid esophagus, slightly worsened since the prior study, concerning for esophagitis. 5. 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 6. Aortic Atherosclerosis (ICD10-I70.0) and Emphysema  (ICD10-J43.9). CT abdomen and pelvis: 1. No acute intra-abdominal process. 2. Slightly lobular liver contour with scattered trace ascites, suggestive of cirrhosis. 3. 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 Electronically Signed   By: WTitus DubinM.D.   On: 04/12/2020 13:51   [place]ous Img Lower Bilateral (DVT)  Result Date: 04/03/2020 CLINICAL DATA:  Lower leg cellulitis EXAM: BILATERAL LOWER EXTREMITY VENOUS DOPPLER ULTRASOUND TECHNIQUE: Gray-scale sonography with graded compression, as well as color Doppler  and duplex ultrasound were performed to evaluate the lower extremity deep venous systems from the level of the common femoral vein and including the common femoral, femoral, profunda femoral, popliteal and calf veins including the posterior tibial, peroneal and gastrocnemius veins when visible. The superficial great saphenous vein was also interrogated. Spectral Doppler was utilized to evaluate flow at rest and with distal augmentation maneuvers in the common femoral, femoral and popliteal veins. COMPARISON:  None. FINDINGS: RIGHT LOWER EXTREMITY Common Femoral Vein: No evidence of thrombus. Normal compressibility, respiratory phasicity and response to augmentation. Saphenofemoral Junction: No evidence of thrombus. Normal compressibility and flow on color Doppler imaging. Profunda Femoral Vein: No evidence of thrombus. Normal compressibility and flow on color Doppler imaging. Femoral Vein: No evidence of thrombus. Normal compressibility, respiratory phasicity and response to augmentation. Popliteal Vein: Not well visualized due to overlying edema. Calf Veins: Not well visualized due to overlying edema. Superficial Great Saphenous Vein: No evidence of thrombus. Normal compressibility. Venous Reflux:  None. Other Findings:  None. LEFT LOWER EXTREMITY Common Femoral Vein: No evidence of thrombus. Normal compressibility, respiratory phasicity and response to augmentation. Saphenofemoral  Junction: No evidence of thrombus. Normal compressibility and flow on color Doppler imaging. Profunda Femoral Vein: No evidence of thrombus. Normal compressibility and flow on color Doppler imaging. Femoral Vein: No evidence of thrombus. Normal compressibility, respiratory phasicity and response to augmentation. Popliteal Vein: Not well visualized due to overlying edema. Calf Veins: Not well visualized due to overlying edema. Superficial Great Saphenous Vein: No evidence of thrombus. Normal compressibility. Venous Reflux:  None. Other Findings:  None. IMPRESSION: Somewhat limited exam due to peripheral edema although no central deep venous thrombosis is noted. Electronically Signed   By: Inez Catalina M.D.   On: 04/03/2020 15:58   DG Chest Port 1 View  Result Date: 04/15/2020 CLINICAL DATA:  Shortness of breath with soft tissue edema EXAM: PORTABLE CHEST 1 VIEW COMPARISON:  Chest radiograph April 09, 2020 and chest CT April 12, 2020 FINDINGS: There is diffuse interstitial prominence, likely representing a combination of fibrosis and interstitial pulmonary edema. There is atelectatic change in the left mid lung. There are small pleural effusions bilaterally. No consolidation. There is cardiomegaly with a degree of pulmonary venous hypertension. There is aortic atherosclerosis. No appreciable adenopathy. Foci of calcification noted in each carotid artery. Calcification noted in the lateral left shoulder, stable. IMPRESSION: Suspect combination of interstitial pulmonary edema and underlying fibrosis. No consolidation. Small pleural effusions bilaterally. Cardiomegaly with pulmonary vascular congestion. Suspect a degree of underlying congestive heart failure. Aortic Atherosclerosis (ICD10-I70.0). Electronically Signed   By: Lowella Grip III M.D.   On: 04/15/2020 09:01   ECHOCARDIOGRAM COMPLETE  Result Date: 04/04/2020    ECHOCARDIOGRAM REPORT   Patient Name:   EMMALEIGH LONGO Date of Exam: 04/04/2020  Medical Rec #:  [id number]      Height:       64.0 in Accession #:    [id number]     Weight:       181.8 lb Date of Birth:  09-12-1945     BSA:          1.879 m Patient Age:    47 years       BP:           171/78 mmHg Patient Gender: F              HR:           98 bpm. Exam Location:  [facility] Procedure:  2D Echo, Color Doppler, Cardiac Doppler and Intracardiac            Opacification Agent Indications:     I50.31 CHF-Acute Diastolic  History:         Patient has no prior history of Echocardiogram examinations.                  COPD, Signs/Symptoms:Shortness of Breath; Risk Factors:Diabetes                  and Current Smoker.  Sonographer:     Charmayne Sheer RDCS (AE) Referring Phys:  Baker Janus Soledad Gerlach NIU Diagnosing Phys: Bartholome Bill MD  Sonographer Comments: Technically difficult study due to poor echo windows. Image acquisition challenging due to patient body habitus and Image acquisition challenging due to COPD. IMPRESSIONS  1. Left ventricular ejection fraction, by estimation, is 55 to 60%. The left ventricle has normal function. The left ventricle has no regional wall motion abnormalities. Left ventricular diastolic parameters are consistent with Grade I diastolic dysfunction (impaired relaxation).  2. Right ventricular systolic function is normal. The right ventricular size is mildly enlarged.  3. Left atrial size was mildly dilated.  4. Right atrial size was mildly dilated.  5. The mitral valve was not well visualized. Trivial mitral valve regurgitation.  6. The aortic valve was not well visualized. Aortic valve regurgitation is trivial. FINDINGS  Left Ventricle: Left ventricular ejection fraction, by estimation, is 55 to 60%. The left ventricle has normal function. The left ventricle has no regional wall motion abnormalities. Definity contrast agent was given IV to delineate the left ventricular  endocardial borders. The left ventricular internal cavity size was normal in size. There is borderline left ventricular  hypertrophy. Left ventricular diastolic parameters are consistent with Grade I diastolic dysfunction (impaired relaxation). Right Ventricle: The right ventricular size is mildly enlarged. No increase in right ventricular wall thickness. Right ventricular systolic function is normal. Left Atrium: Left atrial size was mildly dilated. Right Atrium: Right atrial size was mildly dilated. Pericardium: There is no evidence of pericardial effusion. Mitral Valve: The mitral valve was not well visualized. Trivial mitral valve regurgitation. MV peak gradient, 7.4 mmHg. The mean mitral valve gradient is 3.0 mmHg. Tricuspid Valve: The tricuspid valve is not well visualized. Tricuspid valve regurgitation is trivial. Aortic Valve: The aortic valve was not well visualized. Aortic valve regurgitation is trivial. Aortic valve mean gradient measures 3.0 mmHg. Aortic valve peak gradient measures 6.8 mmHg. Aortic valve area, by VTI measures 2.46 cm. Pulmonic Valve: The pulmonic valve was not well visualized. Pulmonic valve regurgitation is not visualized. Aorta: The aortic root was not well visualized. IAS/Shunts: The interatrial septum was not assessed.  LEFT VENTRICLE PLAX 2D LVIDd:         4.15 cm     Diastology LVIDs:         3.64 cm     LV e' medial:    5.98 cm/s LV PW:         1.43 cm     LV E/e' medial:  18.3 LV IVS:        1.12 cm     LV e' lateral:   6.53 cm/s LVOT diam:     2.00 cm     LV E/e' lateral: 16.8 LV SV:         55 LV SV Index:   29 LVOT Area:     3.14 cm  LV Volumes (MOD) LV vol d, MOD A2C: 55.5 ml  LV vol d, MOD A4C: 63.8 ml LV vol s, MOD A2C: 34.3 ml LV vol s, MOD A4C: 23.4 ml LV SV MOD A2C:     21.2 ml LV SV MOD A4C:     63.8 ml LV SV MOD BP:      34.2 ml RIGHT VENTRICLE RV Basal diam:  3.76 cm LEFT ATRIUM             Index       RIGHT ATRIUM           Index LA diam:        4.90 cm 2.61 cm/m  RA Area:     19.80 cm LA Vol (A2C):   59.5 ml 31.67 ml/m RA Volume:   61.10 ml  32.52 ml/m LA Vol (A4C):   57.5 ml  30.61 ml/m LA Biplane Vol: 61.2 ml 32.58 ml/m  AORTIC VALVE                   PULMONIC VALVE AV Area (Vmax):    2.51 cm    PV Vmax:       0.76 m/s AV Area (Vmean):   2.43 cm    PV Vmean:      53.200 cm/s AV Area (VTI):     2.46 cm    PV VTI:        0.126 m AV Vmax:           130.00 cm/s PV Peak grad:  2.3 mmHg AV Vmean:          83.500 cm/s PV Mean grad:  1.0 mmHg AV VTI:            0.225 m AV Peak Grad:      6.8 mmHg AV Mean Grad:      3.0 mmHg LVOT Vmax:         104.00 cm/s LVOT Vmean:        64.700 cm/s LVOT VTI:          0.176 m LVOT/AV VTI ratio: 0.78  AORTA Ao Root diam: 3.10 cm MITRAL VALVE                TRICUSPID VALVE MV Area (PHT): 3.00 cm     TR Peak grad:   28.9 mmHg MV Peak grad:  7.4 mmHg     TR Vmax:        269.00 cm/s MV Mean grad:  3.0 mmHg MV Vmax:       1.36 m/s     SHUNTS MV Vmean:      86.9 cm/s    Systemic VTI:  0.18 m MV Decel Time: 253 msec     Systemic Diam: 2.00 cm MV E velocity: 109.50 cm/s MV A velocity: 137.50 cm/s MV E/A ratio:  0.80 Bartholome Bill MD Electronically signed by Bartholome Bill MD Signature Date/Time: 04/04/2020/4:54:02 PM    Final    US Abdomen Limited RUQ (LIVER/GB)  Result Date: 04/11/2020 CLINICAL DATA:  Hyperbilirubinemia in a 74 year old female EXAM: ULTRASOUND ABDOMEN LIMITED RIGHT UPPER QUADRANT COMPARISON:  April 07, 2020 CT angiography of the chest and CT of the chest of April 06, 2020 FINDINGS: Gallbladder: Wall thickness at upper limits of normal. No visible pericholecystic fluid or evidence of cholelithiasis. No reported tenderness over the gallbladder. Common bile duct: Diameter: 2.2 mm Liver: Mildly heterogeneous echotexture with nodular contour and suggestion of fissural widening particularly on image 22. No visible lesion on submitted images. Portal vein is patent on color  Doppler imaging with normal direction of blood flow towards the liver. Other: Trace ascites IMPRESSION: 1. Heterogeneous, mildly heterogeneous hepatic echotexture with question  of lobular contour and fissural widening, findings could be seen in the setting of early liver disease. 2. Gallbladder wall thickness at upper limits of normal could be seen in the setting of liver disease and is nonspecific, not associated with biliary calculi, reported tenderness or biliary duct dilation. Electronically Signed   By: Zetta Bills M.D.   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  -12/15- DCD lasix , DCD solumedrol , started PO prednisone taper at 29m po daily  04/14/20- patient on bubbler 8L.  04/14/20- patient weaned to 7[place] 04/17/20- weaned to 5L/min, mentation is lucid GCS 10.  Discussed chest impaging with daughter. Prednisone tapering to 30 mg 04/17/20.  Conitnue with PT  Altered mental status with confusion-imporoved  -severe hypercapnic 3encephalopathy -Meds have been adjusted and mentation is improved -will obtain ABG to evaluate hypercanic encephalopathy -continue on BIPAP -[employer] [facility] - BIPAP qualifiacation - spirometry in process s/p ABG       Thank you for allowing me to participate in the care of this patient.    Patient/Family are satisfied with care plan and all questions have been answered.   This document was prepared using Dragon voice recognition software and may include unintentional dictation errors.     FOttie Glazier M.D.  Division of [facility]

## 2020-04-18 LAB — GLUCOSE, CAPILLARY
Glucose-Capillary: 90 mg/dL (ref 70–99)
Glucose-Capillary: 133 mg/dL — ABNORMAL HIGH (ref 70–99)
Glucose-Capillary: 219 mg/dL — ABNORMAL HIGH (ref 70–99)
Glucose-Capillary: 230 mg/dL — ABNORMAL HIGH (ref 70–99)

## 2020-04-18 LAB — MAGNESIUM: Magnesium: 2 mg/dL (ref 1.7–2.4)

## 2020-04-18 MED ORDER — PREDNISONE 20 MG PO TABS
20.0000 mg | ORAL_TABLET | Freq: Every day | ORAL | Status: DC
  Administered 2020-04-19 – 2020-04-21 (×3): 20 mg via ORAL
  Filled 2020-04-18 (×3): qty 1

## 2020-04-18 NOTE — Plan of Care (Signed)
  Problem: Education: Goal: Knowledge of General Education information will improve Description: Including pain rating scale, medication(s)/side effects and non-pharmacologic comfort measures Outcome: Progressing   Problem: Clinical Measurements: Goal: Will remain free from infection Outcome: Progressing   

## 2020-04-18 NOTE — Progress Notes (Signed)
Physical Therapy Treatment Patient Details Name: Martha [name] MRN: [id number] DOB: [birth year] Today's Date: 04/18/2020    History of Present Illness Martha [name] is a 17yoF who [facility] c BLE, pain, SOB. PMH: HTN, HLD, DM, COPD, tobacco abuse, hypoTSH, gout, depression, tremore, proteinuria, GAD, GERD, diverticulitis.    PT Comments    Pt in bed upon entry, 7L donned, significant other at bedside. Pt is awake, but minimally interactive, does not response much when spoken to. Pt given multimodal cues for readjusting in bed, performing exercises in bed, pt struggles with following cues, struggles with sustained efforts. RN in room at end of session to give meds. Pt's level of interaction limits her ability to participate in therapy today to a high degree.    Follow Up Recommendations  SNF     Equipment Recommendations  None recommended by PT    Recommendations for Other Services       Precautions / Restrictions Precautions Precautions: Fall Restrictions Other Position/Activity Restrictions: BLE wounds, dressed and wrapped    Mobility  Bed Mobility Overal bed mobility: Needs Assistance Bed Mobility:  (repositioning and leg movement required max to totalA (weakness +/- cognitive imapirment))              Transfers                    Ambulation/Gait                 Stairs             Wheelchair Mobility    Modified Rankin (Stroke Patients Only)       Balance                                            Cognition Arousal/Alertness: Awake/alert   Overall Cognitive Status: Impaired/Different from baseline                                 General Comments: flat affect, delayed response, delayed inititation, repatec cues to attend to task      Exercises General Exercises - Lower Extremity Short Arc QuadSinclair [name];Both;10 reps;Seated Hip ABduction/ADduction: AAROM;10 reps;Both;Seated     General Comments        Pertinent Vitals/Pain Pain Assessment: No/denies pain    Home Living                      Prior Function            PT Goals (current goals can now be found in the care plan section) Acute Rehab PT Goals Patient Stated Goal: to get stronger and better able to move Time For Goal Achievement: 04/28/20 Potential to Achieve Goals: Fair Progress towards PT goals: Progressing toward goals    Frequency    Min 2X/week      PT Plan Current plan remains appropriate    Co-evaluation              AM-PAC PT "6 Clicks" Mobility   Outcome Measure  Help needed turning from your back to your side while in a flat bed without using bedrails?: Total Help needed moving from lying on your back to sitting on the side of a flat bed without using bedrails?: Total Help needed moving to and  from a bed to a chair (including a wheelchair)?: Total Help needed standing up from a chair using your arms (e.g., wheelchair or bedside chair)?: Total Help needed to walk in hospital room?: Total Help needed climbing 3-5 steps with a railing? : Total 6 Click Score: 6    End of Session Equipment Utilized During Treatment: Oxygen Activity Tolerance: Patient limited by lethargy;Other (comment);No increased pain (mentation) Patient left: in bed;with call bell/phone within reach;with bed alarm set;with nursing/sitter in room;with family/visitor present Nurse Communication: Mobility status PT Visit Diagnosis: Muscle weakness (generalized) (M62.81);Difficulty in walking, not elsewhere classified (R26.2)     Time: 6295-2841 PT Time Calculation (min) (ACUTE ONLY): 14 min  Charges:  $Therapeutic Exercise: 8-22 mins                     12:41 PM, 04/18/20 Martha [name], PT, DPT Physical Therapist - [facility]  [phone] (Icard)    Martha [name] C 04/18/2020, 12:36 PM

## 2020-04-18 NOTE — Progress Notes (Addendum)
PROGRESS NOTE    Martha [name]  WCB:[id number] DOB: 1946-03-01 DOA: 04/03/2020 PCP: Tracie Harrier, MD   Brief Narrative: Martha [name] is a 74 y.o. female with history of hypertension, hyperlipidemia, diabetes mellitus, COPD, hypothyroidism, gout, tobacco abuse, depression/anxiety, GERD, diverticulitis.  Patient presented secondary to bilateral leg edema and dyspnea and was found to have evidence of respiratory failure requiring BiPAP.  Concern for acute heart failure initially started on IV Lasix.  Complicated by likely nephrotic syndrome, COPD exacerbation in addition to aspiration pneumonia.  Patient is unwilling to 5 L of oxygen via nasal cannula   Assessment & Plan:   Principal Problem:   Acute CHF (congestive heart failure) (HCC) Active Problems:   COPD exacerbation (HCC)   Diabetes mellitus without complication ([place])   Current tobacco use   Hyperlipidemia   Essential hypertension   Hypothyroidism   Gout   Depression   Tremors of nervous system   Acute respiratory failure with hypoxia (HCC)   Cellulitis of lower extremity   Acute pulmonary edema (HCC)   Elevated brain natriuretic peptide (BNP) level   Nephrotic syndrome   Acute respiratory failure with hypoxia and hypercapnia Multifactorial in setting of acute on chronic diastolic heart failure, COPD exacerbation, aspiration pneumonia.  Patient required as much as 10 L/min and 60% FiO2 via high flow nasal cannula in addition to be managed with BiPAP. Weaned to as low as 5 L oxygen/min but now back up to 7 Lpm -Wean oxygen as able  Acute on chronic diastolic heart failure Patient managed with aggressive IV diuresis which included Lasix drip from 12/6 until 12/10.  Patient was then transitioned to IV Lasix intermittently secondary to initial overdiuresis.  Echo from 12/6 significant for an EF of 55 to 60% with grade 1 diastolic dysfunction.  Cardiology was consulted with recommendations for outpatient follow-up.  Weight on admission of 82.5 kg. Weight today of 67 kg. UOP over the last 24 hours of 500 mL with total I/O of -15.8 -Continue Coreg and losartan; discontinue metoprolol on discharge  Aspiration pneumonia Right lower lobe pneumonia from aspiration. Patient treated with Zosyn IV which was transitioned to Unasyn IV. She completed 7 days of antibiotics  Dysphagia Patient appears to have continued issues with aspiration which may be affecting her respiratory status at this time. Patient is tremendously weak which is likely affecting her ability to swallow safely as well. At this time it may be beneficial for palliative care consult in case we are not able to significantly improve dysphagia issues. Currently on a dysphagia 2 diet with thin liquids. -Continue SLP therapy -Will consider palliative care consult if able to discuss with daughter  COPD exacerbation Patient managed with IV Solu-Medrol, DuoNeb.  Currently on a prednisone taper. -Pulmonology recommendations: Prednisone taper  Metabolic alkalosis Appears to be secondary to contraction alkalosis from diuresis.  Patient with compensatory hypercapnia.  Alkalosis is improving with decrease of Lasix therapy.  CO2 on BMP is normal.  Acute urinary retention Unknown etiology. Foley placed on 12/14 for recurrent retention. Urinalysis not suggestive of infection and urine culture with no growth. Foley removed 12/19 -Voiding trial; watch UOP carefully. If requires reinsertion of foley catheter, will need outpatient urology follow-up  Primary hypertension -Continue amlodipine, Coreg, losartan  Hyperbilirubinemia Transient in setting of fluid overload. Abdominal ultrasound significant for possible early liver disease. Resolved.  Nephrotic syndrome Nephrology consulted.  Urinalysis significant for large proteinuria.  Normal albumin.  Patient initially managed with IV Lasix which is now  been discontinued.  Patient received intermittent Lasix as  mentioned above.  Initial plan for biopsy which was unable to be performed secondary to patient's inability to tolerate biopsy procedure. -Nephrology recommendations  Bilateral peripheral venous stasis Unna boots applied.  Hypokalemia Repleted.  In setting of IV diuresis.  Diabetes mellitus, type II Patient is on metformin and glipizide as an outpatient.  Managed on Lantus 20 units in addition to sliding scale while inpatient. -Continue Lantus 20 units and sliding scale insulin  Hypothyroidism -Continue Synthroid 150 mcg daily  Hyperlipidemia -Continue Crestor 5 mg daily   DVT prophylaxis: Lovenox Code Status:   Code Status: Full Code Family Communication: None at bedside. Called daughter with no response Disposition Plan: Discharge to SNF likely in 1-3 days pending final recommendations on diuresis, voiding trial, therapy recommendations   Consultants:   Cardiology  Infectious disease  Nephrology  Pulmonology  Procedures:   TRANSTHORACIC ECHOCARDIOGRAM (04/04/2020) IMPRESSIONS    1. Left ventricular ejection fraction, by estimation, is 55 to 60%. The  left ventricle has normal function. The left ventricle has no regional  wall motion abnormalities. Left ventricular diastolic parameters are  consistent with Grade I diastolic  dysfunction (impaired relaxation).  2. Right ventricular systolic function is normal. The right ventricular  size is mildly enlarged.  3. Left atrial size was mildly dilated.  4. Right atrial size was mildly dilated.  5. The mitral valve was not well visualized. Trivial mitral valve  regurgitation.  6. The aortic valve was not well visualized. Aortic valve regurgitation  is trivial.   Antimicrobials:  Ceftriaxone IV  Keflex PO  Zosyn IV  Vancomycin IV  Unasyn IV    Subjective: No concerns today. No dyspnea or chest pain.  Objective: Vitals:   04/18/20 0500 04/18/20 0752 04/18/20 0810 04/18/20 1126  BP: (!) 162/87  (!) 146/53  (!) 136/51  Pulse: 60 61  60  Resp:  19  19  Temp: 98.4 F (36.9 C) 98.4 F (36.9 C)  98.6 F (37 C)  TempSrc: Oral Oral  Oral  SpO2: 94% 97% 90% 94%  Weight:      Height:        Intake/Output Summary (Last 24 hours) at 04/18/2020 1440 Last data filed at 04/18/2020 1300 Gross per 24 hour  Intake 25 ml  Output 651 ml  Net -626 ml   Filed Weights   04/15/20 0439 04/16/20 0448 04/17/20 0340  Weight: 68.2 kg 68.7 kg 67 kg    Examination:  General exam: Appears calm and comfortable Respiratory system: Diminished and with rhonchi. Respiratory effort normal. Cardiovascular system: S1 & S2 heard, RRR. No murmurs, rubs, gallops or clicks. Gastrointestinal system: Abdomen is nondistended, soft and nontender. No organomegaly or masses felt. Normal bowel sounds heard. Central nervous system: Asleep but arouses easily Musculoskeletal: 1+ thigh edema. No calf tenderness Skin: No cyanosis. No rashes     Data Reviewed: I have personally reviewed following labs and imaging studies  CBC Lab Results  Component Value Date   WBC 11.7 (H) 04/17/2020   RBC 4.74 04/17/2020   HGB 13.4 04/17/2020   HCT 41.4 04/17/2020   MCV 87.3 04/17/2020   MCH 28.3 04/17/2020   PLT 222 04/17/2020   MCHC 32.4 04/17/2020   RDW 15.6 (H) 04/17/2020   LYMPHSABS 0.6 (L) 04/03/2020   MONOABS 0.5 04/03/2020   EOSABS 0.1 04/03/2020   BASOSABS 0.1 65/78/4696     Last metabolic panel Lab Results  Component Value Date   NA  132 (L) 04/17/2020   K 3.5 04/17/2020   CL 93 (L) 04/17/2020   CO2 31 04/17/2020   BUN 28 (H) 04/17/2020   CREATININE 0.54 04/17/2020   GLUCOSE 94 04/17/2020   GFRNONAA >60 04/17/2020   GFRAA >60 11/05/2018   CALCIUM 8.4 (L) 04/17/2020   PROT 6.0 (L) 04/15/2020   ALBUMIN 3.5 04/15/2020   LABGLOB 3.3 04/04/2020   AGRATIO 0.9 04/04/2020   BILITOT 1.2 04/15/2020   ALKPHOS 150 (H) 04/15/2020   AST 27 04/15/2020   ALT 44 04/15/2020   ANIONGAP 8 04/17/2020    CBG  (last 3)  Recent Labs    04/17/20 2329 04/18/20 0755 04/18/20 1157  GLUCAP 236* 90 133*     GFR: Estimated Creatinine Clearance: 57.2 mL/min (by C-G formula based on SCr of 0.54 mg/dL).  Coagulation Profile: No results for input(s): INR, PROTIME in the last 168 hours.  Recent Results (from the past 240 hour(s))  Urine Culture     Status: None   Collection Time: 04/10/20  6:21 PM   Specimen: Urine, Random  Result Value Ref Range Status   Specimen Description   Final    URINE, RANDOM Performed at [facility], [address] [place], [place] [address]    Special Requests   Final    NONE Performed at [facility], [address] [place], [place] [address]    Culture   Final    NO GROWTH Performed at [facility], [place] [address], [place], [place] [address]    Report Status 04/11/2020 FINAL  Final  MRSA PCR Screening     Status: None   Collection Time: 04/12/20  3:25 PM   Specimen: Nasopharyngeal  Result Value Ref Range Status   MRSA by PCR NEGATIVE NEGATIVE Final    Comment:        The GeneXpert MRSA Assay (FDA approved for NASAL specimens only), is one component of a comprehensive MRSA colonization surveillance program. It is not intended to diagnose MRSA infection nor to guide or monitor treatment for MRSA infections. Performed at [facility], [address] [place], [place] [address]         Radiology Studies: DG Chest [facility] 1 View  Result Date: 04/17/2020 CLINICAL DATA:  Respiratory failure EXAM: PORTABLE CHEST 1 VIEW COMPARISON:  April 15, 2020 FINDINGS: The cardiomediastinal silhouette is unchanged in contour.Atherosclerotic calcifications of the aorta. Small RIGHT pleural effusion. No pneumothorax. Diffuse interstitial prominence. Decreased LEFT lower lung atelectasis. Persistent heterogeneous opacity of the RIGHT lung base. Visualized abdomen is unremarkable. Multilevel degenerative changes of the thoracic spine.  Remote LEFT-sided rib fracture. IMPRESSION: 1. Decreased LEFT lower lung atelectasis. Persistent heterogeneous opacity of the RIGHT lung base with diffuse interstitial prominence. 2.  Unchanged small RIGHT pleural effusion. Electronically Signed   By: Valentino Saxon MD   On: 04/17/2020 14:51        Scheduled Meds: . amLODipine  10 mg Oral Daily  . vitamin C  1,000 mg Oral Daily  . carvedilol  12.5 mg Oral BID WC  . chlorhexidine  15 mL Mouth Rinse BID  . Chlorhexidine Gluconate Cloth  6 each Topical Daily  . enoxaparin (LOVENOX) injection  40 mg Subcutaneous Q24H  . feeding supplement (GLUCERNA SHAKE)  237 mL Oral TID BM  . fluticasone  1 spray Each Nare BID  . insulin aspart  0-5 Units Subcutaneous QHS  . insulin aspart  0-9 Units Subcutaneous TID WC  . insulin glargine  20 Units Subcutaneous Daily  . ipratropium-albuterol  3 mL Nebulization TID  . levothyroxine  150 mcg Oral Q0600  . losartan  100 mg Oral Daily  . mouth rinse  15 mL Mouth Rinse q12n4p  . multivitamin with minerals  1 tablet Oral Daily  . pantoprazole  40 mg Oral Daily  . [START ON 04/19/2020] predniSONE  20 mg Oral Q breakfast  . primidone  50 mg Oral BID  . rosuvastatin  5 mg Oral Daily  . sodium chloride flush  3 mL Intravenous Q12H  . vitamin B-12  1,000 mcg Oral Daily   Continuous Infusions: . sodium chloride 250 mL (04/15/20 1256)     LOS: 15 days     Cordelia Poche, MD [facility] 04/18/2020, 2:40 PM  If 7PM-7AM, please contact night-coverage [email or web address]

## 2020-04-18 NOTE — Progress Notes (Signed)
Occupational Therapy Treatment Patient Details Name: Martha [name] MRN: [id number] DOB: August 03, 1945 Today's Date: 04/18/2020    History of present illness Martha [name] is a 10yoF who comes to [facility] on 04/03/20 c BLE, pain, SOB. PMH: HTN, HLD, DM, COPD, tobacco abuse, hypoTSH, gout, depression, tremore, proteinuria, GAD, GERD, diverticulitis.   OT comments  Pt seen for OT treatment this date to f/u re: safety with ADLs/ADL mobility. OT faciliates pt participation in sup to sit with MOD/MAX A and MAX A for sit to sup. Pt only tolerates ~20-30 seconds EOB sitting this date requiring return to supine and assist to manage LEs and trunk. In addition, Pt requires MOD A to wash hair while in fowler's position in bed with HOB elevated ~50 degrees, MIN A for bed level UB grooming/hygiene tasks, MOD A to comb hair, and MIN/MOD A to SETUP and eat from lunch tray. Pt also noted to have wet cough every ~2-3 bites of food. RN notified. Will continue to follow. Continue to anticipate that SNF is safest d/c recommendation given pt's PLOF compared to current level of function/performance with basic self care, bed mobility and ADL transfers.    Follow Up Recommendations  SNF    Equipment Recommendations  3 in 1 bedside commode;Tub/shower seat;Other (comment)    Recommendations for Other Services      Precautions / Restrictions Precautions Precautions: Fall Restrictions Weight Bearing Restrictions: No Other Position/Activity Restrictions: BLE wounds, dressed and wrapped       Mobility Bed Mobility Overal bed mobility: Needs Assistance Bed Mobility: Supine to Sit;Sit to Supine     Supine to sit: Mod assist;Max assist;HOB elevated Sit to supine: Max assist   General bed mobility comments: MAX A to manage trunk and LEs. Pt with minimal tolerance for attempting sup<>sit this date.  Transfers                 General transfer comment: NT this date as pt presenting with increased weakness and  confusion as well as decreased sitting tolerance requiring MAX A to return to supine after only tolerating ~20-30 seconds of static, EOB sitting    Balance Overall balance assessment: Needs assistance Sitting-balance support: Bilateral upper extremity supported;Feet unsupported Sitting balance-[name]: Poor Sitting balance - Comments: requires UE Support at all times and demos decreased tolerance for sitting this date versus last OT session       Standing balance comment: NT                           ADL either performed or assessed with clinical judgement   ADL Overall ADL's : Needs assistance/impaired Eating/Feeding: Minimal assistance;Moderate assistance;Bed level Eating/Feeding Details (indicate cue type and reason): Fowler's position with HOB ~50 degrees for pt to perform self-feeding from bed-side table, with OT providing setup to optimize pt's positioning and food items on tray, as well as open some of the containers that require more fine motor strength such as the ice cream. Pt also requires verbal/tactile cues to attend and avoid spilling. PT with wet cough every 2-3 bites of food noted. RN notified of potential aspiration and agrees to assess patient. Grooming: Wash/dry face;Oral care;Bed level;Cueing for sequencing;Set up;Minimal assistance Grooming Details (indicate cue type and reason): Pt requires increased assistance this date fo rbed level, fowler's position, grooming tasks, versus when seen by OT Thurs 12/16. RN and pt's spouse report that pt had taken O2 off and required titrated back up to 7Lnc  to keep SpO2 >90%. Pt requiring increased cues and physical assist to perform oral care and wash face and is less communicative with therapist this date. RN notified of observations. Upper Body Bathing: Moderate assistance;Bed level Upper Body Bathing Details (indicate cue type and reason): fowler's position to wash hair                                  Vision Baseline Vision/History: Wears glasses Wears Glasses: At all times Patient Visual Report: No change from baseline     Perception     Praxis      Cognition Arousal/Alertness: Awake/alert Behavior During Therapy: WFL for tasks assessed/performed Overall Cognitive Status: Impaired/Different from baseline                                 General Comments: flat affect, delayed response, delayed inititation, repetative multimodal cues to attend to task. In addition pt with mouth movements that seem tardive-dyskinesia-like in presentation. RN notified of observations.        Exercises General Exercises - Lower Extremity Short Arc QuadSinclair [name];Both;10 reps;Seated Hip ABduction/ADduction: AAROM;10 reps;Both;Seated Other Exercises Other Exercises: OT faciliates pt participation in sup to sit with MOD/MAX A and MAX A for sit to sup. Pt only tolerates ~20-30 seconds EOB sitting this date requiring return to supine and assist to manage LEs and trunk. In addition, Pt requires MOD A to wash hair while in fowler's position in bed with HOB elevated ~50 degrees, MIN A for bed level UB grooming/hygiene tasks, MOD A to comb hair, and MIN/MOD A to SETUP and eat from lunch tray. Pt also noted to have wet cough every ~2-3 bites of food. RN notified.   Shoulder Instructions       General Comments      Pertinent Vitals/ Pain       Pain Assessment: No/denies pain  Home Living                                          Prior Functioning/Environment              Frequency  Min 1X/week        Progress Toward Goals  OT Goals(current goals can now be found in the care plan section)  Progress towards OT goals: OT to reassess next treatment (pt with some potential/slight regression this date. Will f/u next assessment to get more accurate picture of potential progress.)  Acute Rehab OT Goals Patient Stated Goal: none stated OT Goal Formulation: With  patient Time For Goal Achievement: 04/28/20 Potential to Achieve Goals: [place]  Plan Discharge plan remains appropriate    Co-evaluation                 AM-PAC OT "6 Clicks" Daily Activity     Outcome Measure   Help from another person eating meals?: A Little Help from another person taking care of personal grooming?: A Lot Help from another person toileting, which includes using toliet, bedpan, or urinal?: A Lot Help from another person bathing (including washing, rinsing, drying)?: A Lot Help from another person to put on and taking off regular upper body clothing?: A Little Help from another person to put on and taking off regular lower body clothing?: A  Lot 6 Click Score: 14    End of Session Equipment Utilized During Treatment: Gait belt  OT Visit Diagnosis: Other abnormalities of gait and mobility (R26.89);Muscle weakness (generalized) (M62.81);Other symptoms and signs involving cognitive function   Activity Tolerance Patient tolerated treatment well;Other (comment)   Patient Left in bed;with call bell/phone within reach;with bed alarm set   Nurse Communication Other (comment) (confusion)        Time: 2706-2376 OT Time Calculation (min): 42 min  Charges: OT General Charges $OT Visit: 1 Visit OT Treatments $Self Care/Home Management : 23-37 mins $Therapeutic Activity: 8-22 mins  Martha [name], [place], OTR/L ascom [phone] 04/18/20, 3:09 PM

## 2020-04-18 NOTE — Care Management Important Message (Signed)
Important Message  Patient Details  Name: Martha [name] MRN: [id number] Date of Birth: 03/22/1946   Medicare Important Message Given:  Yes     Dannette Barbara 04/18/2020, 12:04 PM

## 2020-04-18 NOTE — Progress Notes (Signed)
[facility]  ROUNDING NOTE   Subjective:   Patient resting in bed, denies worsening SOB, nausea or vomiting. Overall edema appears to be improving  Objective:  Vital signs in last 24 hours:  Temp:  [97.9 F (36.6 C)-98.8 F (37.1 C)] 97.9 F (36.6 C) (12/20 2044) Pulse Rate:  [53-62] 53 (12/20 2044) Resp:  [18-19] 18 (12/20 2044) BP: (129-162)/(49-87) 129/61 (12/20 2044) SpO2:  [90 %-97 %] 93 % (12/20 2044)  Weight change:  Filed Weights   04/15/20 0439 04/16/20 0448 04/17/20 0340  Weight: 68.2 kg 68.7 kg 67 kg    Intake/Output: I/O last 3 completed shifts: In: 25 [P.O.:25] Out: 651 [Urine:650; Stool:1]   Intake/Output this shift:  No intake/output data recorded.  Physical Exam: General:  Resting in bed, in no acute distress  Head: Normocephalic, atraumatic  Eyes:  Sclerae and conjunctivae clear  Lungs:   Mild rhonchi bilaterally, Respirations symmetrical, unlabored  on 7L O2   Heart:  S1S2 , no rubs or gallops  Abdomen:   Soft, nontender, nondistended  Extremities:  No peripheral edema  Neurologic:  Awake, alert, appears slightly lethargic  Skin:  Areas of ecchymoses on bilateral arms,resolving, bilateral lower legs wrapped    Basic Metabolic Panel: Recent Labs  Lab 04/13/20 0441 04/14/20 0530 04/15/20 0417 04/16/20 0440 04/17/20 0354 04/18/20 0451  NA 141 145 142 137 132*  --   K 4.1 4.3 4.4 3.9 3.5  --   CL 95* 99 101 95* 93*  --   CO2 35* 35* 31 33* 31  --   GLUCOSE 206* 173* 98 109* 94  --   BUN 30* 29* 26* 27* 28*  --   CREATININE 0.55 0.47 0.50 0.42* 0.54  --   CALCIUM 8.9 9.2 9.2 8.6* 8.4*  --   MG 2.0 2.3 2.2 2.1 2.2 2.0    Liver Function Tests: Recent Labs  Lab 04/12/20 0623 04/13/20 0441 04/14/20 0530 04/15/20 0417  AST 25 18 35 27  ALT 29 27 41 44  ALKPHOS 84 77 118 150*  BILITOT 2.2* 1.6* 1.3* 1.2  PROT 6.6 6.3* 6.3* 6.0*  ALBUMIN 4.1 3.8 3.8 3.5   No results for input(s): LIPASE, AMYLASE in the last 168  hours. No results for input(s): AMMONIA in the last 168 hours.  CBC: Recent Labs  Lab 04/13/20 0441 04/14/20 0530 04/15/20 0417 04/16/20 0440 04/17/20 0354  WBC 16.6* 14.1* 14.0* 10.6* 11.7*  HGB 14.7 14.9 14.3 13.3 13.4  HCT 48.3* 47.3* 45.6 42.0 41.4  MCV 92.0 90.6 91.0 89.6 87.3  PLT 186 188 180 188 222    Cardiac Enzymes: No results for input(s): CKTOTAL, CKMB, CKMBINDEX, TROPONINI in the last 168 hours.  BNP: Invalid input(s): POCBNP  CBG: Recent Labs  Lab 04/17/20 2329 04/18/20 0755 04/18/20 1157 04/18/20 1741 04/18/20 2046  GLUCAP 236* 90 133* 219* 230*    Microbiology: Results for orders placed or performed during the hospital encounter of 04/03/20  Resp Panel by RT-PCR (Flu A&B, Covid) Nasopharyngeal Swab     Status: None   Collection Time: 04/03/20 11:41 AM   Specimen: Nasopharyngeal Swab; Nasopharyngeal(NP) swabs in vial transport medium  Result Value Ref Range Status   SARS Coronavirus 2 by RT PCR NEGATIVE NEGATIVE Final    Comment: (NOTE) SARS-CoV-2 target nucleic acids are NOT DETECTED.  The SARS-CoV-2 RNA is generally detectable in upper respiratory specimens during the acute phase of infection. The lowest concentration of SARS-CoV-2 viral copies this assay can detect  is 138 copies/mL. A negative result does not preclude SARS-Cov-2 infection and should not be used as the sole basis for treatment or other patient management decisions. A negative result may occur with  improper specimen collection/handling, submission of specimen other than nasopharyngeal swab, presence of viral mutation(s) within the areas targeted by this assay, and inadequate number of viral copies(<138 copies/mL). A negative result must be combined with clinical observations, patient history, and epidemiological information. The expected result is Negative.  Fact Sheet for Patients:  EntrepreneurPulse.com.au  Fact Sheet for Healthcare Providers:   IncredibleEmployment.be  This test is no t yet approved or cleared by the [place] FDA and  has been authorized for detection and/or diagnosis of SARS-CoV-2 by FDA under an Emergency Use Authorization (EUA). This EUA will remain  in effect (meaning this test can be used) for the duration of the COVID-19 declaration under Section 564(b)(1) of the Act, 21 U.S.C.section 360bbb-3(b)(1), unless the authorization is terminated  or revoked sooner.       Influenza A by PCR NEGATIVE NEGATIVE Final   Influenza B by PCR NEGATIVE NEGATIVE Final    Comment: (NOTE) The Xpert Xpress SARS-CoV-2/FLU/RSV plus assay is intended as an aid in the diagnosis of influenza from Nasopharyngeal swab specimens and should not be used as a sole basis for treatment. Nasal washings and aspirates are unacceptable for Xpert Xpress SARS-CoV-2/FLU/RSV testing.  Fact Sheet for Patients: EntrepreneurPulse.com.au  Fact Sheet for Healthcare Providers: IncredibleEmployment.be  This test is not yet approved or cleared by the [place] FDA and has been authorized for detection and/or diagnosis of SARS-CoV-2 by FDA under an Emergency Use Authorization (EUA). This EUA will remain in effect (meaning this test can be used) for the duration of the COVID-19 declaration under Section 564(b)(1) of the Act, 21 U.S.C. section 360bbb-3(b)(1), unless the authorization is terminated or revoked.  Performed at [facility], [address] [place], [place] [address]   Culture, blood (Routine X 2) w Reflex to ID Panel     Status: None   Collection Time: 04/03/20  3:27 PM   Specimen: BLOOD  Result Value Ref Range Status   Specimen Description BLOOD BLOOD RIGHT ARM  Final   Special Requests   Final    BOTTLES DRAWN AEROBIC AND ANAEROBIC Blood Culture adequate volume   Culture   Final    NO GROWTH 5 DAYS Performed at [facility], [address], [place], [place] [address]    Report Status 04/08/2020 FINAL  Final  Urine Culture     Status: Abnormal   Collection Time: 04/03/20  4:00 PM   Specimen: Urine, Random  Result Value Ref Range Status   Specimen Description   Final    URINE, RANDOM Performed at [facility], [address], [place], [place] [address]    Special Requests   Final    NONE Performed at [facility], [address] [place], [place] [address]    Culture (A)  Final    >=100,000 COLONIES/mL ESCHERICHIA COLI >=100,000 COLONIES/mL AEROCOCCUS URINAE Standardized susceptibility testing for this organism is not available. Performed at [facility], [place] [address], [place],  [address]    Report Status 04/07/2020 FINAL  Final   Organism ID, Bacteria ESCHERICHIA COLI (A)  Final      Susceptibility   Escherichia coli - MIC*    AMPICILLIN 4 SENSITIVE Sensitive     CEFAZOLIN <=4 SENSITIVE Sensitive     CEFEPIME <=0.12  SENSITIVE Sensitive     CEFTRIAXONE <=0.25 SENSITIVE Sensitive     CIPROFLOXACIN <=0.25 SENSITIVE Sensitive     GENTAMICIN <=1 SENSITIVE Sensitive     IMIPENEM <=0.25 SENSITIVE Sensitive     NITROFURANTOIN 32 SENSITIVE Sensitive     TRIMETH/SULFA <=20 SENSITIVE Sensitive     AMPICILLIN/SULBACTAM <=2 SENSITIVE Sensitive     PIP/TAZO <=4 SENSITIVE Sensitive     * >=100,000 COLONIES/mL ESCHERICHIA COLI  Culture, blood (Routine X 2) w Reflex to ID Panel     Status: None   Collection Time: 04/03/20 10:36 PM   Specimen: BLOOD  Result Value Ref Range Status   Specimen Description BLOOD BLOOD LEFT HAND  Final   Special Requests   Final    BOTTLES DRAWN AEROBIC AND ANAEROBIC Blood Culture adequate volume   Culture   Final    NO GROWTH 5 DAYS Performed at [facility], [address], [place], [place] [address]    Report Status 04/08/2020 FINAL  Final  Urine Culture     Status: None   Collection Time: 04/08/20  9:52 AM   Specimen: Urine, Random  Result  Value Ref Range Status   Specimen Description   Final    URINE, RANDOM Performed at [facility], [address] [place], [place] [address]    Special Requests   Final    NONE Performed at [facility], [address], [place], [place] [address]    Culture   Final    NO GROWTH Performed at [facility], [place] [address], [place], [place] [address]    Report Status 04/09/2020 FINAL  Final  CULTURE, BLOOD (ROUTINE X 2) w Reflex to ID Panel     Status: None   Collection Time: 04/08/20 10:02 AM   Specimen: BLOOD  Result Value Ref Range Status   Specimen Description BLOOD BLOOD RIGHT HAND  Final   Special Requests   Final    BOTTLES DRAWN AEROBIC AND ANAEROBIC Blood Culture results may not be optimal due to an inadequate volume of blood received in culture bottles   Culture   Final    NO GROWTH 5 DAYS Performed at [facility], [address] [place], [place] [address]    Report Status 04/13/2020 FINAL  Final  MRSA PCR Screening     Status: None   Collection Time: 04/08/20 10:04 AM   Specimen: Nasal Mucosa; Nasopharyngeal  Result Value Ref Range Status   MRSA by PCR NEGATIVE NEGATIVE Final    Comment:        The GeneXpert MRSA Assay (FDA approved for NASAL specimens only), is one component of a comprehensive MRSA colonization surveillance program. It is not intended to diagnose MRSA infection nor to guide or monitor treatment for MRSA infections. Performed at [facility], [address] [place], [place] [address]   CULTURE, BLOOD (ROUTINE X 2) w Reflex to ID Panel     Status: None   Collection Time: 04/08/20 10:26 AM   Specimen: BLOOD  Result Value Ref Range Status   Specimen Description BLOOD BLOOD RIGHT HAND  Final   Special Requests   Final    BOTTLES DRAWN AEROBIC AND ANAEROBIC Blood Culture results may not be optimal due to an inadequate volume of blood received in culture bottles   Culture   Final    NO GROWTH 5  DAYS Performed at [facility], [address] [place],  [address]    Report Status  04/13/2020 FINAL  Final  Urine Culture     Status: None   Collection Time: 04/10/20  6:21 PM   Specimen: Urine, Random  Result Value Ref Range Status   Specimen Description   Final    URINE, RANDOM Performed at [facility], [address], [place], [place] [address]    Special Requests   Final    NONE Performed at [facility], [address], [place], [place] [address]    Culture   Final    NO GROWTH Performed at [facility], [place] [address] [place], [place] [address]    Report Status 04/11/2020 FINAL  Final  MRSA PCR Screening     Status: None   Collection Time: 04/12/20  3:25 PM   Specimen: Nasopharyngeal  Result Value Ref Range Status   MRSA by PCR NEGATIVE NEGATIVE Final    Comment:        The GeneXpert MRSA Assay (FDA approved for NASAL specimens only), is one component of a comprehensive MRSA colonization surveillance program. It is not intended to diagnose MRSA infection nor to guide or monitor treatment for MRSA infections. Performed at [facility], [address] [place],  [address]     Coagulation Studies: No results for input(s): LABPROT, INR in the last 72 hours.  Urinalysis: No results for input(s): COLORURINE, LABSPEC, PHURINE, GLUCOSEU, HGBUR, BILIRUBINUR, KETONESUR, PROTEINUR, UROBILINOGEN, NITRITE, LEUKOCYTESUR in the last 72 hours.  Invalid input(s): APPERANCEUR    Imaging: DG Chest Port 1 View  Result Date: 04/17/2020 CLINICAL DATA:  Respiratory failure EXAM: PORTABLE CHEST 1 VIEW COMPARISON:  April 15, 2020 FINDINGS: The cardiomediastinal silhouette is unchanged in contour.Atherosclerotic calcifications of the aorta. Small RIGHT pleural effusion. No pneumothorax. Diffuse interstitial prominence. Decreased LEFT lower lung atelectasis. Persistent heterogeneous opacity of the RIGHT lung base.  Visualized abdomen is unremarkable. Multilevel degenerative changes of the thoracic spine. Remote LEFT-sided rib fracture. IMPRESSION: 1. Decreased LEFT lower lung atelectasis. Persistent heterogeneous opacity of the RIGHT lung base with diffuse interstitial prominence. 2.  Unchanged small RIGHT pleural effusion. Electronically Signed   By: Valentino Saxon MD   On: 04/17/2020 14:51     Medications:   . sodium chloride 250 mL (04/15/20 1256)   . amLODipine  10 mg Oral Daily  . vitamin C  1,000 mg Oral Daily  . carvedilol  12.5 mg Oral BID WC  . chlorhexidine  15 mL Mouth Rinse BID  . Chlorhexidine Gluconate Cloth  6 each Topical Daily  . enoxaparin (LOVENOX) injection  40 mg Subcutaneous Q24H  . feeding supplement (GLUCERNA SHAKE)  237 mL Oral TID BM  . fluticasone  1 spray Each Nare BID  . insulin aspart  0-5 Units Subcutaneous QHS  . insulin aspart  0-9 Units Subcutaneous TID WC  . insulin glargine  20 Units Subcutaneous Daily  . ipratropium-albuterol  3 mL Nebulization TID  . levothyroxine  150 mcg Oral Q0600  . losartan  100 mg Oral Daily  . mouth rinse  15 mL Mouth Rinse q12n4p  . multivitamin with minerals  1 tablet Oral Daily  . pantoprazole  40 mg Oral Daily  . [START ON 04/19/2020] predniSONE  20 mg Oral Q breakfast  . primidone  50 mg Oral BID  . rosuvastatin  5 mg Oral Daily  . sodium chloride flush  3 mL Intravenous Q12H  . vitamin B-12  1,000 mcg Oral Daily   sodium chloride, acetaminophen, acetaminophen, albuterol, calcium carbonate, dextromethorphan-guaiFENesin, fluticasone,  labetalol, nitroGLYCERIN, ondansetron (ZOFRAN) IV, prochlorperazine, sodium chloride flush  Assessment/ Plan:   [name] is a 74 y.o. 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  #Proteinuria, 17 g.   # Nephrotic syndrome with anasarca.    Renal biopsy on hold, awaiting respiratory status to get improved Anti Phospholipase A2 receptor Autoantibody/ [facility]- [id number] pending  #Acute hypoxic respiratory failure  With right lower lobe pneumonia.  Antibiotics per primary team Symptoms improving gradually  #Hypertension Blood pressure 153/68 today Continue  amlodipine, losartan and carvedilol  #Diabetes mellitus type 2 Lab Results  Component Value Date   HGBA1C 6.5 (H) 04/03/2020   Current antihyperglycemic management include Insulin Aspart and Insulin Glargine   LOS: [place] 12/20/20218:50 PM

## 2020-04-18 NOTE — Progress Notes (Signed)
Pulmonary and Critical Care  Medicine          Date: 04/18/2020,   MRN# [id number] Martha [name] 1946/03/23     AdmissionWeight: 72 kg                 CurrentWeight: 5 kg  Referring physician: Dr. Si Raider    CHIEF COMPLAINT:   Acute on chronic hypoxemic respiratory failure   HISTORY OF PRESENT ILLNESS   74 year old female with history of diabetes COPD lifelong smoking hypothyroidism gout major depressive disorder, anxiety disorder and GERD, diverticulitis who came in with signs and symptoms of anasarca including peripheral edema and pulmonary edema.  Patient states is been worse than usual despite compliance with wrapping the lower extremities.  She also reported coughing and dyspnea.  Upon presentation to the emergency room she was found to be desaturating in the mid 80s and was placed on 4 L supplemental oxygen with some subsequent improvement of SPO2.  She was then treated with BiPAP and felt better.  She was initially admitted with acute exacerbation of CHF and cardiology was consulted for this.  She was also found to have proteinuria with CKD and had renal consultation placed. Pulmonary consultation placed for acute on chronic hypoxemic respiratory failure with failure to wean down oxygen.  Patient did have CT PE protocol done to rule out acute pulmonary venous embolism which was negative however did show bullous emphysema with overlying pulmonary edema as well as a right lower lobe consolidated infiltrate suggestive of pneumonia as well as right hemidiaphragm elevation due to hepatomegaly.  04/09/20- patient seen at bedside, remains encephalopathic.  Discussed with [facility] attending Dr Si Raider plan for transfer to SDU.     04/10/20- patient is improved.  She is more awake, she was able to speak with me despite BIPAP mask, she has normal 4/4 grip bilaterally and moves LE to verbal communication without encouragment. Mentation has significantly improved. She is flushed red on  examination.    04/11/20- patient is improved.  She is off BIPAP during my evaluation.  She is able to speak and reported right upper extermity pain, this may be related to K infusion. I dicussed this with RN who has already addressed this with IV team. ABG is in progress. Cardiology and nephrology following, UOP is low.   04/13/20- patient is improved.  She is speaking in full sentences. I spoke to her common law husband to review hospital course and care plan.  Patient will need BIPAP for home, ive discussed case with [facility] team and qualification for NIV is in process.  Spirometry is in process. 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  She smokes actively , I have provided smoking cessation counseling today and will be following up on outpatient for cessation therapy.  LE swelling is resolved and sensorium is close to baseline. There is no diarreah  04/14/20- patient resting in bed. Mildly confused. Breathing is diminished bilaterllly , on 8L HF bubbler. ABG tonight.   04/15/20- patient is resting in bed. Vitals are stable.  Seems patient may have been more confused after Haldol but not from ventilation as her ABG is without hypercapnia.  Nocturnal pulse oximetry is pending for home BIPAP  04/16/20- Daughter present during my evaluation.  She shares patient was smoking 2.5 packs daily and recently has been trying to stop. We discussed BIPAP for home.  Reviewed nocturnal pulse oximetry today with mild desaturation overnight. Patient is down to 5L/min [place] and improved.  04/17/20-  Patient is improved shes down to 5L/min.  She has thus far lost appx 35lbs since admission due to fluid overload. Her CO2 is improved and repeat CXR with interval improvement of interstitial infiltrates more notable on right side. Shes slow to improve due to advanced age.  04/18/20- patient is resting in bed in no apparent distress.  She is with weaning protocol on prednisone now on $Remo'30mg'KGtxk$  PO.  She had a slight set  back in appears with increased O2 req to 7L/min from 5L/min.    PAST MEDICAL HISTORY   Past Medical History:  Diagnosis Date  . Abnormal LFTs (liver function tests)   . Anxiety   . Arthritis    knees, feet  . Asthma   . Chronic bronchitis ([place])   . Chronic constipation   . Chronic low back pain   . Cigarette smoker    Has cut back Smoking to 1 pack every other day  . COPD (chronic obstructive pulmonary disease) ([place])   . Depression   . Diabetic neuropathy ([place])   . Diverticulitis 2015   gi recommended repeat scope in 10 years  . Essential hypertension   . Fatty liver   . GERD (gastroesophageal reflux disease)   . Gout   . History of mammogram 05/28/2013  . Hyperlipidemia   . Hypothyroidism   . Insomnia   . Iron deficiency anemia   . Low serum vitamin D   . Personal history of tobacco use, presenting hazards to health 09/29/2015  . Plantar fasciitis   . RLS (restless legs syndrome)   . Shortness of breath dyspnea   . Thyroid cancer ([place]) 2009  . Tremors of nervous system    "I think I have parkinson's disease"  . Type 2 diabetes mellitus ([place])   . Wears dentures    uppers     SURGICAL HISTORY   Past Surgical History:  Procedure Laterality Date  . CATARACT EXTRACTION W/PHACO Left 11/24/2015   Procedure: CATARACT EXTRACTION PHACO AND INTRAOCULAR LENS PLACEMENT (IOC);  Surgeon: Birder Robson, MD;  Location: [facility] ORS;  Service: Ophthalmology;  Laterality: Left;  [place] 38.3 AP% 20.6 CDE 7.89 FLUID PACK LOT # [id number]  . CATARACT EXTRACTION W/PHACO Right 06/23/2019   Procedure: CATARACT EXTRACTION PHACO AND INTRAOCULAR LENS PLACEMENT ([place]) RIGHT DIABETIC;  Surgeon: Birder Robson, MD;  Location: [place];  Service: Ophthalmology;  Laterality: Right;  Diabetic  . COLONOSCOPY  2004, 2009, 2015   [place]  . POLYPECTOMY  2009  . THYROIDECTOMY  2009   states she was tx with radiactive iodine  . TUBAL LIGATION    . UPPER GASTROINTESTINAL ENDOSCOPY        FAMILY HISTORY   Family History  Problem Relation Age of Onset  . Diabetes Mellitus II Father   . Lung disease Father   . Diabetes Mellitus II Mother   . Arthritis Mother   . Thyroid disease Other   . Asthma Sister   . Emphysema Sister   . Hypertension Sister   . Breast cancer Paternal Aunt      SOCIAL HISTORY   Social History   Tobacco Use  . Smoking status: Current Every Day Smoker    Packs/day: 1.00    Years: 39.50    Pack years: 39.50    Types: Cigarettes  . Smokeless tobacco: Never Used  . Tobacco comment: curently smokes 12 cigerattes a day. I'm trying to cut back.  Vaping Use  . Vaping Use: Never used  Substance  Use Topics  . Alcohol use: No    Alcohol/week: 0.0 standard drinks  . Drug use: No     MEDICATIONS    Home Medication:    Current Medication:  Current Facility-Administered Medications:  .  0.9 %  sodium chloride infusion, 250 mL, Intravenous, PRN, Ivor Costa, MD, Last Rate: 5 mL/hr at 04/15/20 1256, 250 mL at 04/15/20 1256 .  acetaminophen (TYLENOL) suppository 650 mg, 650 mg, Rectal, Q6H PRN, Lang Snow, NP, 650 mg at 04/08/20 2234 .  acetaminophen (TYLENOL) tablet 650 mg, 650 mg, Oral, Q6H PRN, Lorella Nimrod, MD, 650 mg at 04/15/20 2212 .  albuterol (PROVENTIL) (2.5 MG/3ML) 0.083% nebulizer solution 2.5 mg, 2.5 mg, Nebulization, Q4H PRN, Ivor Costa, MD .  amLODipine (NORVASC) tablet 10 mg, 10 mg, Oral, Daily, Wouk, Ailene Rud, MD, 10 mg at 04/18/20 1107 .  ascorbic acid (VITAMIN C) tablet 1,000 mg, 1,000 mg, Oral, Daily, Ivor Costa, MD, 1,000 mg at 04/18/20 1106 .  calcium carbonate (TUMS - dosed in mg elemental calcium) chewable tablet 200 mg of elemental calcium, 1 tablet, Oral, Q8H PRN, Nicole Kindred A, DO, 200 mg of elemental calcium at 04/14/20 1824 .  carvedilol (COREG) tablet 12.5 mg, 12.5 mg, Oral, BID WC, Teodoro Spray, MD, 12.5 mg at 04/18/20 1107 .  chlorhexidine (PERIDEX) 0.12 % solution 15 mL, 15 mL, Mouth  Rinse, BID, Wouk, Ailene Rud, MD, 15 mL at 04/18/20 1107 .  Chlorhexidine Gluconate Cloth 2 % PADS 6 each, 6 each, Topical, Daily, Wouk, Ailene Rud, MD, 6 each at 04/18/20 1108 .  dextromethorphan-guaiFENesin (MUCINEX DM) 30-600 MG per 12 hr tablet 1 tablet, 1 tablet, Oral, BID PRN, Ivor Costa, MD, 1 tablet at 04/18/20 1113 .  enoxaparin (LOVENOX) injection 40 mg, 40 mg, Subcutaneous, Q24H, Nicole Kindred A, DO, 40 mg at 04/17/20 2302 .  feeding supplement (GLUCERNA SHAKE) (GLUCERNA SHAKE) liquid 237 mL, 237 mL, Oral, TID BM, Griffith, Kelly A, DO, 237 mL at 04/18/20 1112 .  fluticasone (FLONASE) 50 MCG/ACT nasal spray 1 spray, 1 spray, Each Nare, BID, Sharion Settler, NP, 1 spray at 04/18/20 1109 .  fluticasone (FLONASE) 50 MCG/ACT nasal spray 1-2 spray, 1-2 spray, Each Nare, Daily PRN, Ivor Costa, MD .  insulin aspart (novoLOG) injection 0-5 Units, 0-5 Units, Subcutaneous, QHS, Ivor Costa, MD, 2 Units at 04/17/20 2339 .  insulin aspart (novoLOG) injection 0-9 Units, 0-9 Units, Subcutaneous, TID WC, Ivor Costa, MD, 1 Units at 04/18/20 1233 .  insulin glargine (LANTUS) injection 20 Units, 20 Units, Subcutaneous, Daily, Wouk, Ailene Rud, MD, 20 Units at 04/18/20 1109 .  ipratropium-albuterol (DUONEB) 0.5-2.5 (3) MG/3ML nebulizer solution 3 mL, 3 mL, Nebulization, TID, Ivor Costa, MD, 3 mL at 04/18/20 0810 .  labetalol (NORMODYNE) injection 20 mg, 20 mg, Intravenous, Q2H PRN, Wouk, Ailene Rud, MD, 20 mg at 04/15/20 1252 .  levothyroxine (SYNTHROID) tablet 150 mcg, 150 mcg, Oral, Q0600, Ivor Costa, MD, 150 mcg at 04/16/20 0659 .  losartan (COZAAR) tablet 100 mg, 100 mg, Oral, Daily, Wouk, Ailene Rud, MD, 100 mg at 04/18/20 1110 .  MEDLINE mouth rinse, 15 mL, Mouth Rinse, q12n4p, Wouk, Ailene Rud, MD, 15 mL at 04/18/20 1111 .  multivitamin with minerals tablet 1 tablet, 1 tablet, Oral, Daily, Nicole Kindred A, DO, 1 tablet at 04/18/20 1106 .  nitroGLYCERIN (NITROSTAT) SL tablet 0.4 mg, 0.4  mg, Sublingual, Q5 min PRN, Wouk, Ailene Rud, MD, 0.4 mg at 04/08/20 0659 .  ondansetron (ZOFRAN) injection 4 mg, 4  mg, Intravenous, Q8H PRN, Ivor Costa, MD, 4 mg at 04/15/20 1100 .  pantoprazole (PROTONIX) EC tablet 40 mg, 40 mg, Oral, Daily, Nicole Kindred A, DO, 40 mg at 04/18/20 1106 .  [START ON 04/19/2020] predniSONE (DELTASONE) tablet 20 mg, 20 mg, Oral, Q breakfast, Ajna Moors, MD .  primidone (MYSOLINE) tablet 50 mg, 50 mg, Oral, BID, Nicole Kindred A, DO, 50 mg at 04/18/20 1111 .  prochlorperazine (COMPAZINE) injection 10 mg, 10 mg, Intravenous, Q6H PRN, Nicole Kindred A, DO, 10 mg at 04/15/20 1729 .  rosuvastatin (CRESTOR) tablet 5 mg, 5 mg, Oral, Daily, Ivor Costa, MD, 5 mg at 04/18/20 1106 .  sodium chloride flush (NS) 0.9 % injection 3 mL, 3 mL, Intravenous, Q12H, Ivor Costa, MD, 3 mL at 04/18/20 1111 .  sodium chloride flush (NS) 0.9 % injection 3 mL, 3 mL, Intravenous, PRN, Ivor Costa, MD .  vitamin B-12 (CYANOCOBALAMIN) tablet 1,000 mcg, 1,000 mcg, Oral, Daily, Ivor Costa, MD, 1,000 mcg at 04/18/20 1107    ALLERGIES   Nicotine polacrilex, Nicotrol [nicotine], and Codeine sulfate     REVIEW OF SYSTEMS    Review of Systems:  Unable to obtain due to encephalopathy   VS: BP (!) 136/51 (BP Location: Right Arm)   Pulse 60   Temp 98.6 F (37 C) (Oral)   Resp 19   Ht $R'5\' 4"'OI$  (1.626 m)   Wt 67 kg   SpO2 94%   BMI 25.35 kg/m      PHYSICAL EXAM    GENERAL:NAD, no fevers, chills, no weakness no fatigue HEAD: Normocephalic, atraumatic.  EYES: Pupils equal, round, reactive to light. Extraocular muscles intact. No scleral icterus.  MOUTH: dry mucosal membrane. Dentition intact. No abscess noted. On BIPAP EAR, NOSE, THROAT: Clear without exudates. No external lesions.  NECK: Supple. No thyromegaly. No nodules. No JVD.  PULMONARY: mild rhonchi bilaterally  CARDIOVASCULAR: S1 and S2. Regular rate and rhythm. No murmurs, rubs, or gallops. No edema. Pedal  pulses 2+ bilaterally.  GASTROINTESTINAL: Soft, nontender, nondistended. No masses. Positive bowel sounds. No hepatosplenomegaly.  MUSCULOSKELETAL: No swelling, clubbing, +edema 2++.  NEUROLOGIC: GCS 10  SKIN: No ulceration, lesions, rashes, or cyanosis. Skin warm and dry. Turgor intact.       IMAGING    DG Chest 1 View  Result Date: 04/09/2020 CLINICAL DATA:  Acute respiratory failure EXAM: CHEST  1 VIEW COMPARISON:  04/03/2016 FINDINGS: Normal cardiac silhouette. Interval increase in interstitial edema pattern. No focal consolidation. No pneumothorax. IMPRESSION: Worsening interstitial edema. Electronically Signed   By: Suzy Bouchard M.D.   On: 04/09/2020 06:31   DG Chest 2 View  Result Date: 04/03/2020 CLINICAL DATA:  Hypoxia. Leg edema and shortness of breath. Pt daughter states that pt is supposed to be getting a renal bx but they are not able to do it because of the edema in pts legs. Pt states that she does feel short of breath. Pt does not wear oxygen at home. Pt placed on O2 in triage due to sats being in the 80s. Hx of asthma, chronic bronchitis, COPD, thyroid cancer- 2009, DM, current smoker. EXAM: CHEST - 2 VIEW COMPARISON:  01/04/2018 FINDINGS: Cardiac silhouette is borderline enlarged. Stable aortic atherosclerotic calcifications. No mediastinal or hilar masses. No evidence of adenopathy. Lungs demonstrate diffuse bilateral irregular interstitial thickening which has increased compared to the prior study. There is also linear/reticular scarring in the left lung base, which is stable. No lung consolidation. No pleural effusion and no pneumothorax. Skeletal structures  are demineralized, but grossly intact. IMPRESSION: 1. Bilateral irregular interstitial thickening, increased compared to the prior study, along with borderline cardiomegaly. Suspect mild congestive heart failure. Consider diffuse interstitial infection or inflammation in the proper clinical setting. No evidence of  lobar pneumonia. Electronically Signed   By: Lajean Manes M.D.   On: 04/03/2020 13:06   CT CHEST W CONTRAST  Result Date: 04/06/2020 CLINICAL DATA:  Hemoptysis. EXAM: CT CHEST WITH CONTRAST TECHNIQUE: Multidetector CT imaging of the chest was performed during intravenous contrast administration. CONTRAST:  75mL OMNIPAQUE IOHEXOL 300 MG/ML  SOLN COMPARISON:  January 06, 2019 FINDINGS: Cardiovascular: There is marked severity calcification of the aortic arch. There is mild cardiomegaly. No pericardial effusion. Mediastinum/Nodes: There is mild pretracheal and right hilar lymphadenopathy. Thyroid gland, trachea, and esophagus demonstrate no significant findings. Lungs/Pleura: Mild-to-moderate severity emphysematous lung disease is seen involving the bilateral upper lobes with stable bullous disease is seen along the medial aspect of the left apex. Mild to moderate severity areas of atelectasis and/or infiltrate are seen within the posterior aspects of the bilateral upper lobes and left lung base. Moderate to marked severity posterior right basilar atelectasis and/or infiltrate is noted. A very small right pleural effusion is seen. No pneumothorax is identified. Upper Abdomen: There is stable hepatosplenomegaly with stable, moderate severity diffuse bilateral adrenal gland enlargement. Musculoskeletal: A chronic lateral eighth left rib fracture is seen. A chronic compression fracture deformity of the T12 vertebral body is noted. IMPRESSION: 1. Mild-to-moderate severity posterior bilateral upper lobe and left basilar atelectasis and/or infiltrate. 2. Moderate to marked severity posterior right basilar atelectasis and/or infiltrate. 3. Very small right pleural effusion. 4. Mild cardiomegaly. 5. Stable emphysematous lung disease. 6. Chronic lateral eighth left rib fracture. 7. Chronic compression fracture deformity of the T12 vertebral body. 8. Aortic atherosclerosis. Aortic Atherosclerosis (ICD10-I70.0) and  Emphysema (ICD10-J43.9). Electronically Signed   By: Virgina Norfolk M.D.   On: 04/06/2020 15:33   CT ANGIO CHEST PE W OR WO CONTRAST  Result Date: 04/07/2020 CLINICAL DATA:  Shortness of breath EXAM: CT ANGIOGRAPHY CHEST WITH CONTRAST TECHNIQUE: Multidetector CT imaging of the chest was performed using the standard protocol during bolus administration of intravenous contrast. Multiplanar CT image reconstructions and MIPs were obtained to evaluate the vascular anatomy. CONTRAST:  67mL OMNIPAQUE IOHEXOL 350 MG/ML SOLN COMPARISON:  April 06, 2020 FINDINGS: Cardiovascular: There is a optimal opacification of the pulmonary arteries. There is no central,segmental, or subsegmental filling defects within the pulmonary arteries. There is unchanged cardiomegaly. Mitral valve calcifications and coronary artery calcifications are seen. No pericardial effusion or thickening. No evidence right heart strain. There is normal three-vessel brachiocephalic anatomy without proximal stenosis. Scattered aortic atherosclerosis is noted. Mediastinum/Nodes: No hilar, mediastinal, or axillary adenopathy. Thyroid gland, trachea, and esophagus demonstrate no significant findings. Lungs/Pleura: Patchy airspace opacity seen at the left upper lung and posterior right lung base with air bronchograms. There are trace bilateral pleural effusions present. Centrilobular and paraseptal emphysematous changes seen at both lung apices. Upper Abdomen: No acute abnormalities present in the visualized portions of the upper abdomen. Musculoskeletal: No chest wall abnormality. No acute or significant osseous findings. There is a chronic anterior wedge compression deformity of the T12 vertebral body with 50% loss in vertebral body height. Review of the MIP images confirms the above findings. IMPRESSION: No central, segmental, or subsegmental pulmonary embolism. Unchanged multifocal patchy airspace opacities within both lungs which could be due to  multifocal pneumonia Trace bilateral pleural effusions, right greater than left. Aortic  Atherosclerosis (ICD10-I70.0). Electronically Signed   By: Prudencio Pair M.D.   On: 04/07/2020 19:43   CT CHEST ABDOMEN PELVIS W CONTRAST  Result Date: 04/12/2020 CLINICAL DATA:  Shortness of breath, abdominal pain, and bilateral leg swelling. EXAM: CT CHEST, ABDOMEN, AND PELVIS WITH CONTRAST TECHNIQUE: Multidetector CT imaging of the chest, abdomen and pelvis was performed following the standard protocol during bolus administration of intravenous contrast. CONTRAST:  147mL OMNIPAQUE IOHEXOL 300 MG/ML  SOLN COMPARISON:  Right upper quadrant ultrasound from yesterday. CTA chest dated April 07, 2020. FINDINGS: CT CHEST FINDINGS Cardiovascular: Unchanged borderline cardiomegaly. No pericardial effusion. No thoracic aortic aneurysm or dissection. Coronary, aortic arch, and branch vessel atherosclerotic vascular disease. 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 No central pulmonary embolism. Mediastinum/Nodes: Unchanged mildly enlarged precarinal lymph node measuring 12 mm in short axis, stable since September 2020, likely reactive. No enlarged hilar or axillary lymph nodes. Prior thyroidectomy. Prominent circumferential wall thickening of the proximal and mid esophagus, slightly worsened since the prior study. Lungs/Pleura: Slightly increased small right pleural effusion. New trace left pleural effusion. Prominent atelectasis within the right lower lobe with patchy areas of non enhancement and air bronchograms consistent with underlying pneumonia, slightly worsened when compared to prior study. Increased patchy consolidation in both posterior upper lobes. Minimal subsegmental atelectasis in the left lower lobe. Moderate centrilobular and mild paraseptal emphysema again noted with bullous changes in the medial left lung apex. No  pneumothorax. 7 mm subpleural nodule in the left upper lobe (series 4, image 39), similar to most recent chest CTs, but slightly more conspicuous when compared to CT chest from September 2020. Musculoskeletal: No acute or significant osseous findings. Unchanged chronic T12 compression deformity. Old left eighth rib fracture. CT ABDOMEN PELVIS FINDINGS Hepatobiliary: Slightly lobular liver contour. No focal liver abnormality is seen. No gallstones, gallbladder wall thickening, or biliary dilatation. Pancreas: Unremarkable. No pancreatic ductal dilatation or surrounding inflammatory changes. Spleen: Normal in size without focal abnormality. Adrenals/Urinary Tract: Chronic bilateral adrenal hypertrophy. No renal calculi, focal lesion, or hydronephrosis. The bladder is unremarkable. Stomach/Bowel: Stomach is within normal limits. Appendix appears normal. No evidence of bowel wall thickening, distention, or inflammatory changes. Prominent sigmoid colonic diverticulosis. Vascular/Lymphatic: Aortic atherosclerosis. 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 No enlarged abdominal or pelvic lymph nodes. Reproductive: Uterus and bilateral adnexa are unremarkable. Other: Scattered trace ascites. No pneumoperitoneum. Small fat containing paraumbilical hernias. Musculoskeletal: No acute or significant osseous findings. IMPRESSION: CT chest: 1. Mildly worsened multifocal pneumonia. 2. Slightly increased small right pleural effusion. New trace left pleural effusion. 3. 7 mm subpleural nodule in the left upper lobe, similar to most recent chest CTs, but slightly more conspicuous when compared to CT chest from September 2020. While this may be infectious or inflammatory, attention on follow-up imaging is recommended. 4. Prominent circumferential wall thickening of the proximal and mid esophagus, slightly worsened since the prior study, concerning for esophagitis. 5. Severe stenosis/probable occlusion of the proximal  left subclavian artery with reconstitution beyond the left vertebral artery. 6. Aortic Atherosclerosis (ICD10-I70.0) and Emphysema (ICD10-J43.9). CT abdomen and pelvis: 1. No acute intra-abdominal process. 2. Slightly lobular liver contour with scattered trace ascites, suggestive of cirrhosis. 3. 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 Electronically Signed   By: Titus Dubin M.D.   On: 04/12/2020 13:51   US Venous Img Lower Bilateral (DVT)  Result Date: 04/03/2020 CLINICAL DATA:  Lower leg cellulitis  EXAM: BILATERAL LOWER EXTREMITY VENOUS DOPPLER ULTRASOUND TECHNIQUE: Gray-scale sonography with graded compression, as well as color Doppler and duplex ultrasound were performed to evaluate the lower extremity deep venous systems from the level of the common femoral vein and including the common femoral, femoral, profunda femoral, popliteal and calf veins including the posterior tibial, peroneal and gastrocnemius veins when visible. The superficial great saphenous vein was also interrogated. Spectral Doppler was utilized to evaluate flow at rest and with distal augmentation maneuvers in the common femoral, femoral and popliteal veins. COMPARISON:  None. FINDINGS: RIGHT LOWER EXTREMITY Common Femoral Vein: No evidence of thrombus. Normal compressibility, respiratory phasicity and response to augmentation. Saphenofemoral Junction: No evidence of thrombus. Normal compressibility and flow on color Doppler imaging. Profunda Femoral Vein: No evidence of thrombus. Normal compressibility and flow on color Doppler imaging. Femoral Vein: No evidence of thrombus. Normal compressibility, respiratory phasicity and response to augmentation. Popliteal Vein: Not well visualized due to overlying edema. Calf Veins: Not well visualized due to overlying edema. Superficial Great Saphenous Vein: No evidence of thrombus. Normal compressibility. Venous Reflux:  None. Other Findings:  None. LEFT LOWER EXTREMITY Common  Femoral Vein: No evidence of thrombus. Normal compressibility, respiratory phasicity and response to augmentation. Saphenofemoral Junction: No evidence of thrombus. Normal compressibility and flow on color Doppler imaging. Profunda Femoral Vein: No evidence of thrombus. Normal compressibility and flow on color Doppler imaging. Femoral Vein: No evidence of thrombus. Normal compressibility, respiratory phasicity and response to augmentation. Popliteal Vein: Not well visualized due to overlying edema. Calf Veins: Not well visualized due to overlying edema. Superficial Great Saphenous Vein: No evidence of thrombus. Normal compressibility. Venous Reflux:  None. Other Findings:  None. IMPRESSION: Somewhat limited exam due to peripheral edema although no central deep venous thrombosis is noted. Electronically Signed   By: Inez Catalina M.D.   On: 04/03/2020 15:58   DG Chest Port 1 View  Result Date: 04/17/2020 CLINICAL DATA:  Respiratory failure EXAM: PORTABLE CHEST 1 VIEW COMPARISON:  April 15, 2020 FINDINGS: The cardiomediastinal silhouette is unchanged in contour.Atherosclerotic calcifications of the aorta. Small RIGHT pleural effusion. No pneumothorax. Diffuse interstitial prominence. Decreased LEFT lower lung atelectasis. Persistent heterogeneous opacity of the RIGHT lung base. Visualized abdomen is unremarkable. Multilevel degenerative changes of the thoracic spine. Remote LEFT-sided rib fracture. IMPRESSION: 1. Decreased LEFT lower lung atelectasis. Persistent heterogeneous opacity of the RIGHT lung base with diffuse interstitial prominence. 2.  Unchanged small RIGHT pleural effusion. Electronically Signed   By: Valentino Saxon MD   On: 04/17/2020 14:51   DG Chest Port 1 View  Result Date: 04/15/2020 CLINICAL DATA:  Shortness of breath with soft tissue edema EXAM: PORTABLE CHEST 1 VIEW COMPARISON:  Chest radiograph April 09, 2020 and chest CT April 12, 2020 FINDINGS: There is diffuse  interstitial prominence, likely representing a combination of fibrosis and interstitial pulmonary edema. There is atelectatic change in the left mid lung. There are small pleural effusions bilaterally. No consolidation. There is cardiomegaly with a degree of pulmonary venous hypertension. There is aortic atherosclerosis. No appreciable adenopathy. Foci of calcification noted in each carotid artery. Calcification noted in the lateral left shoulder, stable. IMPRESSION: Suspect combination of interstitial pulmonary edema and underlying fibrosis. No consolidation. Small pleural effusions bilaterally. Cardiomegaly with pulmonary vascular congestion. Suspect a degree of underlying congestive heart failure. Aortic Atherosclerosis (ICD10-I70.0). Electronically Signed   By: Lowella Grip III M.D.   On: 04/15/2020 09:01   ECHOCARDIOGRAM COMPLETE  Result Date: 04/04/2020    ECHOCARDIOGRAM  REPORT   Patient Name:   Martha [name] Date of Exam: 04/04/2020 Medical Rec #:  [id number]      Height:       64.0 in Accession #:    [id number]     Weight:       181.8 lb Date of Birth:  [birth year]     BSA:          1.879 m Patient Age:    45 years       BP:           171/78 mmHg Patient Gender: F              HR:           98 bpm. Exam Location:  [facility] Procedure: 2D Echo, Color Doppler, Cardiac Doppler and Intracardiac            Opacification Agent Indications:     I50.31 CHF-Acute Diastolic  History:         Patient has no prior history of Echocardiogram examinations.                  COPD, Signs/Symptoms:Shortness of Breath; Risk Factors:Diabetes                  and Current Smoker.  Sonographer:     Charmayne Sheer RDCS (AE) Referring Phys:  Baker Janus Soledad Gerlach NIU Diagnosing Phys: Bartholome Bill MD  Sonographer Comments: Technically difficult study due to poor echo windows. Image acquisition challenging due to patient body habitus and Image acquisition challenging due to COPD. IMPRESSIONS  1. Left ventricular ejection fraction, by estimation,  is 55 to 60%. The left ventricle has normal function. The left ventricle has no regional wall motion abnormalities. Left ventricular diastolic parameters are consistent with Grade I diastolic dysfunction (impaired relaxation).  2. Right ventricular systolic function is normal. The right ventricular size is mildly enlarged.  3. Left atrial size was mildly dilated.  4. Right atrial size was mildly dilated.  5. The mitral valve was not well visualized. Trivial mitral valve regurgitation.  6. The aortic valve was not well visualized. Aortic valve regurgitation is trivial. FINDINGS  Left Ventricle: Left ventricular ejection fraction, by estimation, is 55 to 60%. The left ventricle has normal function. The left ventricle has no regional wall motion abnormalities. Definity contrast agent was given IV to delineate the left ventricular  endocardial borders. The left ventricular internal cavity size was normal in size. There is borderline left ventricular hypertrophy. Left ventricular diastolic parameters are consistent with Grade I diastolic dysfunction (impaired relaxation). Right Ventricle: The right ventricular size is mildly enlarged. No increase in right ventricular wall thickness. Right ventricular systolic function is normal. Left Atrium: Left atrial size was mildly dilated. Right Atrium: Right atrial size was mildly dilated. Pericardium: There is no evidence of pericardial effusion. Mitral Valve: The mitral valve was not well visualized. Trivial mitral valve regurgitation. MV peak gradient, 7.4 mmHg. The mean mitral valve gradient is 3.0 mmHg. Tricuspid Valve: The tricuspid valve is not well visualized. Tricuspid valve regurgitation is trivial. Aortic Valve: The aortic valve was not well visualized. Aortic valve regurgitation is trivial. Aortic valve mean gradient measures 3.0 mmHg. Aortic valve peak gradient measures 6.8 mmHg. Aortic valve area, by VTI measures 2.46 cm. Pulmonic Valve: The pulmonic valve was not  well visualized. Pulmonic valve regurgitation is not visualized. Aorta: The aortic root was not well visualized. IAS/Shunts: The interatrial septum was not assessed.  LEFT VENTRICLE PLAX 2D  LVIDd:         4.15 cm     Diastology LVIDs:         3.64 cm     LV e' medial:    5.98 cm/s LV PW:         1.43 cm     LV E/e' medial:  18.3 LV IVS:        1.12 cm     LV e' lateral:   6.53 cm/s LVOT diam:     2.00 cm     LV E/e' lateral: 16.8 LV SV:         55 LV SV Index:   29 LVOT Area:     3.14 cm  LV Volumes (MOD) LV vol d, MOD A2C: 55.5 ml LV vol d, MOD A4C: 63.8 ml LV vol s, MOD A2C: 34.3 ml LV vol s, MOD A4C: 23.4 ml LV SV MOD A2C:     21.2 ml LV SV MOD A4C:     63.8 ml LV SV MOD BP:      34.2 ml RIGHT VENTRICLE RV Basal diam:  3.76 cm LEFT ATRIUM             Index       RIGHT ATRIUM           Index LA diam:        4.90 cm 2.61 cm/m  RA Area:     19.80 cm LA Vol (A2C):   59.5 ml 31.67 ml/m RA Volume:   61.10 ml  32.52 ml/m LA Vol (A4C):   57.5 ml 30.61 ml/m LA Biplane Vol: 61.2 ml 32.58 ml/m  AORTIC VALVE                   PULMONIC VALVE AV Area (Vmax):    2.51 cm    PV Vmax:       0.76 m/s AV Area (Vmean):   2.43 cm    PV Vmean:      53.200 cm/s AV Area (VTI):     2.46 cm    PV VTI:        0.126 m AV Vmax:           130.00 cm/s PV Peak grad:  2.3 mmHg AV Vmean:          83.500 cm/s PV Mean grad:  1.0 mmHg AV VTI:            0.225 m AV Peak Grad:      6.8 mmHg AV Mean Grad:      3.0 mmHg LVOT Vmax:         104.00 cm/s LVOT Vmean:        64.700 cm/s LVOT VTI:          0.176 m LVOT/AV VTI ratio: 0.78  AORTA Ao Root diam: 3.10 cm MITRAL VALVE                TRICUSPID VALVE MV Area (PHT): 3.00 cm     TR Peak grad:   28.9 mmHg MV Peak grad:  7.4 mmHg     TR Vmax:        269.00 cm/s MV Mean grad:  3.0 mmHg MV Vmax:       1.36 m/s     SHUNTS MV Vmean:      86.9 cm/s    Systemic VTI:  0.18 m MV Decel Time: 253 msec     Systemic Diam: 2.00 cm MV E  velocity: 109.50 cm/s MV A velocity: 137.50 cm/s MV E/A ratio:  0.80  Bartholome Bill MD Electronically signed by Bartholome Bill MD Signature Date/Time: 04/04/2020/4:54:02 PM    Final    US Abdomen Limited RUQ (LIVER/GB)  Result Date: 04/11/2020 CLINICAL DATA:  Hyperbilirubinemia in a 74 year old female EXAM: ULTRASOUND ABDOMEN LIMITED RIGHT UPPER QUADRANT COMPARISON:  April 07, 2020 CT angiography of the chest and CT of the chest of April 06, 2020 FINDINGS: Gallbladder: Wall thickness at upper limits of normal. No visible pericholecystic fluid or evidence of cholelithiasis. No reported tenderness over the gallbladder. Common bile duct: Diameter: 2.2 mm Liver: Mildly heterogeneous echotexture with nodular contour and suggestion of fissural widening particularly on image 22. No visible lesion on submitted images. Portal vein is patent on color Doppler imaging with normal direction of blood flow towards the liver. Other: Trace ascites IMPRESSION: 1. Heterogeneous, mildly heterogeneous hepatic echotexture with question of lobular contour and fissural widening, findings could be seen in the setting of early liver disease. 2. Gallbladder wall thickness at upper limits of normal could be seen in the setting of liver disease and is nonspecific, not associated with biliary calculi, reported tenderness or biliary duct dilation. Electronically Signed   By: Zetta Bills M.D.   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  -12/15- DCD lasix , DCD solumedrol , started PO prednisone taper at  74m po daily  04/14/20- patient on bubbler 8L.  04/14/20- patient weaned to [place] 04/17/20- weaned to 5L/min, mentation is lucid GCS 10.  Discussed chest impaging with daughter. Prednisone tapering to 30 mg 04/17/20.  Conitnue with PT 04/18/20-  Increased O2 req, patietnt is 16L net negative now. Continue PT.OT/chest PT for atelectasis   Altered mental status with confusion-improved  -severe hypercapnic 3encephalopathy -Meds have been adjusted and mentation is improved -will obtain ABG to evaluate hypercanic encephalopathy -continue on BIPAP -[employer] - BIPAP qualifiacation - spirometry in process s/p ABG       Thank you for allowing me to participate in the care of this patient.    Patient/Family are satisfied with care plan and all questions have been answered.   This document was prepared using Dragon voice recognition software and may include unintentional dictation errors.     FOttie Glazier M.D.  Division of [facility]

## 2020-04-18 NOTE — Plan of Care (Signed)

## 2020-04-18 NOTE — Consult Note (Signed)
[place] Nurse wound follow up Wound type: venous stasis Unna's boot compression and topical wound care to be changed/provided Measurement: Left heel: 2cm x 2cm x0.1cm Right posterior calf: 1.ocm x 1.0cm  0.1cm  Wound NLW:HKNZ areas clean and pink;slightly bleeding but not as much Drainage (amount, consistency, odor) bloody; no odor Periwound:intact; less edema  Dressing procedure/placement/frequency: Removed Unna's boots Washed and dried legs Cover open wounds with calcium alginate Applied Unna's boots bilaterally  Apply Prevalon boots bilaterally to offload heels; patient at risk, she does not move in the bed at all.   Edema much improved; I am going to decrease compression wrap changes to weekly; topical care weekly with Unnas boot change.   [place], [place], [place]

## 2020-04-19 ENCOUNTER — Encounter: Payer: Self-pay | Admitting: Internal Medicine

## 2020-04-19 ENCOUNTER — Inpatient Hospital Stay: Payer: Medicare Other

## 2020-04-19 DIAGNOSIS — Z7189 Other specified counseling: Secondary | ICD-10-CM

## 2020-04-19 DIAGNOSIS — E440 Moderate protein-calorie malnutrition: Secondary | ICD-10-CM | POA: Insufficient documentation

## 2020-04-19 DIAGNOSIS — Z515 Encounter for palliative care: Secondary | ICD-10-CM

## 2020-04-19 LAB — CBC
HCT: 40.5 % (ref 36.0–46.0)
Hemoglobin: 13.3 g/dL (ref 12.0–15.0)
MCH: 28.2 pg (ref 26.0–34.0)
MCHC: 32.8 g/dL (ref 30.0–36.0)
MCV: 86 fL (ref 80.0–100.0)
Platelets: 214 10*3/uL (ref 150–400)
RBC: 4.71 MIL/uL (ref 3.87–5.11)
RDW: 15.6 % — ABNORMAL HIGH (ref 11.5–15.5)
WBC: 8.6 10*3/uL (ref 4.0–10.5)
nRBC: 0 % (ref 0.0–0.2)

## 2020-04-19 LAB — GLUCOSE, CAPILLARY
Glucose-Capillary: 106 mg/dL — ABNORMAL HIGH (ref 70–99)
Glucose-Capillary: 156 mg/dL — ABNORMAL HIGH (ref 70–99)
Glucose-Capillary: 326 mg/dL — ABNORMAL HIGH (ref 70–99)
Glucose-Capillary: 162 mg/dL — ABNORMAL HIGH (ref 70–99)

## 2020-04-19 LAB — BASIC METABOLIC PANEL
Anion gap: 8 (ref 5–15)
BUN: 20 mg/dL (ref 8–23)
CO2: 31 mmol/L (ref 22–32)
Calcium: 8.1 mg/dL — ABNORMAL LOW (ref 8.9–10.3)
Chloride: 94 mmol/L — ABNORMAL LOW (ref 98–111)
Creatinine, Ser: 0.41 mg/dL — ABNORMAL LOW (ref 0.44–1.00)
GFR, Estimated: >60 mL/min (ref >60)
Glucose, Bld: 79 mg/dL (ref 70–99)
Potassium: 3.1 mmol/L — ABNORMAL LOW (ref 3.5–5.1)
Sodium: 133 mmol/L — ABNORMAL LOW (ref 135–145)

## 2020-04-19 LAB — MISC LABCORP TEST (SEND OUT): Labcorp test code: 141330

## 2020-04-19 LAB — MAGNESIUM: Magnesium: 2 mg/dL (ref 1.7–2.4)

## 2020-04-19 NOTE — Progress Notes (Signed)
Nutrition Follow-up  DOCUMENTATION CODES:   Non-severe (moderate) malnutrition in context of chronic illness  INTERVENTION:   Magic cup TID with meals, each supplement provides 290 kcal and 9 grams of protein  Honey Thick Mighty Shake TID, each supplement provides 200kcal and 7g protein   MVI daily   If pt and family wish to pursue aggressive measures, would recommend nasogastric tube and nutrition support   Pt at high refeed risk; recommend monitor potassium, magnesium and phosphorus labs daily until stable  NUTRITION DIAGNOSIS:   Moderate Malnutrition related to chronic illness (COPD, CHF) as evidenced by moderate fat depletion,severe fat depletion,moderate muscle depletion,severe muscle depletion. -new diagnosis  GOAL:   Patient will meet greater than or equal to 90% of their needs  MONITOR:   PO intake,Supplement acceptance,Labs,Weight trends,I & O's,Skin  REASON FOR ASSESSMENT:   LOS    ASSESSMENT:   74 y.o. female with medical history significant of  HTN, HLD, DM, COPD, tobacco abuse, hypothyroidism, gout, depression, tremor, proteinuria, depression, anxiety, GERD and diverticulitis who presents with bilateral leg edema and SOB.   Met with pt in room today. Pt reports that she is tired today and does not feel like eating. Per family member at bedside, pt ate a few bites of her lunch tray and ate 75% of a [employer]. Pt's breakfast tray was sitting on her side table untouched. Pt documented to be eating <15% of meals in hospital and is now without adequate nutrition for > 7 days. Pt s/p MBSS and was changed to a dysphagia 1/honey thick diet. RD will discontinue Glucerna as this is a thin liquid. RD will add Magic Cups and honey thick Mighty Shakes to meal trays. Pt reports that she does love ice cream and she feels like she can eat 3 Magic Cups per day. Pt is at high refeed risk. Palliative care consult pending. Spoke with MD, if family wishes to pursue full aggressive  care, would recommend nasogastric tube placement and nutrition support; this can be provided in the interim to see if with adequate nutrition, pt might have improvement in her ability to swallow. Feeding tube would not be a long term solution to help pt meet her estimated needs and would not decrease the risk for aspiration. Per chart, pt is down 11.7lbs(7%) since admit; this is significant.    Medications reviewed and include: vitamin C, lovenox, insulin, synthroid, MVI, protonix, prednisone, B12  Labs reviewed: Na 133(L), K 3.1(L), creat 0.41(L), Mg 2.0 wnl cbgs- 106, 156 x 24 hrs  NUTRITION - FOCUSED PHYSICAL EXAM:  Flowsheet Row Most Recent Value  Orbital Region Severe depletion  Upper Arm Region Moderate depletion  Thoracic and Lumbar Region Moderate depletion  Buccal Region Mild depletion  Temple Region Mild depletion  Clavicle Bone Region Mild depletion  Clavicle and Acromion Bone Region Mild depletion  Scapular Bone Region Moderate depletion  Dorsal Hand Severe depletion  Patellar Region Moderate depletion  Anterior Thigh Region Moderate depletion  Posterior Calf Region Moderate depletion  Edema (RD Assessment) Mild  Hair Reviewed  Eyes Reviewed  Mouth Reviewed  Skin Reviewed  Nails Reviewed     Diet Order:   Diet Order            DIET - DYS 1 Room service appropriate? Yes; Fluid consistency: Honey Thick  Diet effective now                EDUCATION NEEDS:   Education needs have been addressed  Skin:  Skin  Assessment: Reviewed RN Assessment (Left heel: 2cm x 2cm x0.1cm, Right posterior calf: 1.ocm x 1.0cm  0.1cm)  Last BM:  12/20- type 6  Height:   Ht Readings from Last 1 Encounters:  04/09/20 _0  (1.626 m)    Weight:   Wt Readings from Last 1 Encounters:  04/19/20 66.8 kg    Ideal Body Weight:  54.5 kg  BMI:  Body mass index is 25.28 kg/m.  Estimated Nutritional Needs:   Kcal:  1500-1700kcal/day  Protein:  75-85g/day  Fluid:   1.4-1.6L/day  Koleen Distance MS, RD, LDN Please refer to [facility] for RD and/or RD on-call/weekend/after hours pager

## 2020-04-19 NOTE — Progress Notes (Signed)
PROGRESS NOTE    Martha [name]  [id number] DOB: 10-16-1945 DOA: 04/03/2020 PCP: Tracie Harrier, MD   Brief Narrative: Martha [name] is a 74 y.o. female with history of hypertension, hyperlipidemia, diabetes mellitus, COPD, hypothyroidism, gout, tobacco abuse, depression/anxiety, GERD, diverticulitis.  Patient presented secondary to bilateral leg edema and dyspnea and was found to have evidence of respiratory failure requiring BiPAP.  Concern for acute heart failure initially started on IV Lasix.  Complicated by likely nephrotic syndrome, COPD exacerbation in addition to aspiration pneumonia.  Patient is unwilling to 5 L of oxygen via nasal cannula   Assessment & Plan:   Principal Problem:   Acute CHF (congestive heart failure) (HCC) Active Problems:   COPD exacerbation (HCC)   Diabetes mellitus without complication ([place])   Current tobacco use   Hyperlipidemia   Essential hypertension   Hypothyroidism   Gout   Depression   Tremors of nervous system   Acute respiratory failure with hypoxia (HCC)   Cellulitis of lower extremity   Acute pulmonary edema (HCC)   Elevated brain natriuretic peptide (BNP) level   Nephrotic syndrome   Malnutrition of moderate degree   Acute respiratory failure with hypoxia and hypercapnia Multifactorial in setting of acute on chronic diastolic heart failure, COPD exacerbation, aspiration pneumonia.  Patient required as much as 10 L/min and 60% FiO2 via high flow nasal cannula in addition to be managed with BiPAP. Weaned to as low as 5 L oxygen/min but now back up to 6 Lpm -Wean oxygen as able  Acute on chronic diastolic heart failure Patient managed with aggressive IV diuresis which included Lasix drip from 12/6 until 12/10.  Patient was then transitioned to IV Lasix intermittently secondary to initial overdiuresis.  Echo from 12/6 significant for an EF of 55 to 60% with grade 1 diastolic dysfunction.  Cardiology was consulted with  recommendations for outpatient follow-up. Weight on admission of 82.5 kg. Weight today of 66.8 kg. UOP over the last 24 hours of 150 mL with multiple unmeasured occurrences. Net IO Since Admission: -[id number] mL [04/19/20 1412] -Continue Coreg and losartan; discontinue metoprolol on discharge  Aspiration pneumonia Right lower lobe pneumonia from aspiration. Patient treated with Zosyn IV which was transitioned to Unasyn IV. She completed 7 days of antibiotics  Dysphagia Patient appears to have continued issues with aspiration which may be affecting her respiratory status at this time. Patient is tremendously weak which is likely affecting her ability to swallow safely as well. At this time it may be beneficial for palliative care consult in case we are not able to significantly improve dysphagia issues. Patient with evidence of silent aspiration per SLP. Diet adjusted to puree and honey thickened liquids. -Continue SLP therapy -Palliative care consulted and recommendations pending  COPD exacerbation Patient managed with IV Solu-Medrol, DuoNeb.  Currently on a prednisone taper. -Pulmonology recommendations: Prednisone taper  Metabolic alkalosis Appears to be secondary to contraction alkalosis from diuresis.  Patient with compensatory hypercapnia.  Alkalosis is improving with decrease of Lasix therapy.  CO2 on BMP is normal.  Acute urinary retention Unknown etiology. Foley placed on 12/14 for recurrent retention. Urinalysis not suggestive of infection and urine culture with no growth. Foley removed 12/19 -Voiding trial; watch UOP carefully. If requires reinsertion of foley catheter, will need outpatient urology follow-up -If goals of care change, can replace foley for comfort  Primary hypertension -Continue amlodipine, Coreg, losartan  Hyperbilirubinemia Transient in setting of fluid overload. Abdominal ultrasound significant for possible early liver disease.  Resolved.  Nephrotic  syndrome Nephrology consulted.  Urinalysis significant for large proteinuria.  Normal albumin.  Patient initially managed with IV Lasix which is now been discontinued.  Patient received intermittent Lasix as mentioned above.  Initial plan for biopsy which was unable to be performed secondary to patient's inability to tolerate biopsy procedure. -Nephrology recommendations  Bilateral peripheral venous stasis Unna boots applied.  Hypokalemia Repleted.  In setting of IV diuresis.  Diabetes mellitus, type II Patient is on metformin and glipizide as an outpatient.  Managed on Lantus 20 units in addition to sliding scale while inpatient. -Continue Lantus 20 units and sliding scale insulin  Hypothyroidism -Continue Synthroid 150 mcg daily  Hyperlipidemia -Continue Crestor 5 mg daily  Moderate malnutrition   DVT prophylaxis: Lovenox Code Status:   Code Status: DNR Family Communication: Significant other at bedside. Daughter on telephone. Disposition Plan: Discharge pending goals of care discussions. Patient with recurrent aspiration. May be transitioning to comfort measures depending on discussions.   Consultants:   Cardiology  Infectious disease  Nephrology  Pulmonology  Procedures:   TRANSTHORACIC ECHOCARDIOGRAM (04/04/2020) IMPRESSIONS    1. Left ventricular ejection fraction, by estimation, is 55 to 60%. The  left ventricle has normal function. The left ventricle has no regional  wall motion abnormalities. Left ventricular diastolic parameters are  consistent with Grade I diastolic  dysfunction (impaired relaxation).  2. Right ventricular systolic function is normal. The right ventricular  size is mildly enlarged.  3. Left atrial size was mildly dilated.  4. Right atrial size was mildly dilated.  5. The mitral valve was not well visualized. Trivial mitral valve  regurgitation.  6. The aortic valve was not well visualized. Aortic valve regurgitation  is  trivial.   Antimicrobials:  Ceftriaxone IV  Keflex PO  Zosyn IV  Vancomycin IV  Unasyn IV    Subjective: No dyspnea or chest pain.  Objective: Vitals:   04/19/20 0741 04/19/20 0919 04/19/20 1157 04/19/20 1304  BP:  (!) 154/60 (!) 134/53   Pulse:  61 60   Resp:  18 18   Temp:  98.5 F (36.9 C) 97.9 F (36.6 C)   TempSrc:  Axillary Oral   SpO2: 94% 94% 92% (!) 87%  Weight:      Height:        Intake/Output Summary (Last 24 hours) at 04/19/2020 1410 Last data filed at 04/19/2020 1240 Gross per 24 hour  Intake 0 ml  Output 125 ml  Net -125 ml   Filed Weights   04/16/20 0448 04/17/20 0340 04/19/20 0414  Weight: 68.7 kg 67 kg 66.8 kg    Examination:  General exam: Appears calm and comfortable Respiratory system: Clear to auscultation. No rhonchi today. Respiratory effort normal. Cardiovascular system: S1 & S2 heard, RRR. Gastrointestinal system: Abdomen is nondistended, soft and nontender. No organomegaly or masses felt. Normal bowel sounds heard. Central nervous system: Alert Musculoskeletal: Mild edema edema. No calf tenderness Skin: No cyanosis. No rashes Psychiatry: Judgement and insight appear normal. Mood & affect appropriate.    Data Reviewed: I have personally reviewed following labs and imaging studies  CBC Lab Results  Component Value Date   WBC 8.6 04/19/2020   RBC 4.71 04/19/2020   HGB 13.3 04/19/2020   HCT 40.5 04/19/2020   MCV 86.0 04/19/2020   MCH 28.2 04/19/2020   PLT 214 04/19/2020   MCHC 32.8 04/19/2020   RDW 15.6 (H) 04/19/2020   LYMPHSABS 0.6 (L) 04/03/2020   MONOABS 0.5 04/03/2020  EOSABS 0.1 04/03/2020   BASOSABS 0.1 40/98/1191     Last metabolic panel Lab Results  Component Value Date   NA 133 (L) 04/19/2020   K 3.1 (L) 04/19/2020   CL 94 (L) 04/19/2020   CO2 31 04/19/2020   BUN 20 04/19/2020   CREATININE 0.41 (L) 04/19/2020   GLUCOSE 79 04/19/2020   GFRNONAA >60 04/19/2020   GFRAA >60 11/05/2018   CALCIUM 8.1  (L) 04/19/2020   PROT 6.0 (L) 04/15/2020   ALBUMIN 3.5 04/15/2020   LABGLOB 3.3 04/04/2020   AGRATIO 0.9 04/04/2020   BILITOT 1.2 04/15/2020   ALKPHOS 150 (H) 04/15/2020   AST 27 04/15/2020   ALT 44 04/15/2020   ANIONGAP 8 04/19/2020    CBG (last 3)  Recent Labs    04/18/20 2046 04/19/20 0913 04/19/20 1159  GLUCAP 230* 106* 156*     GFR: Estimated Creatinine Clearance: 57.1 mL/min (A) (by C-G formula based on SCr of 0.41 mg/dL (L)).  Coagulation Profile: No results for input(s): INR, PROTIME in the last 168 hours.  Recent Results (from the past 240 hour(s))  Urine Culture     Status: None   Collection Time: 04/10/20  6:21 PM   Specimen: Urine, Random  Result Value Ref Range Status   Specimen Description   Final    URINE, RANDOM Performed at [facility], [address] [place], [place] [address]    Special Requests   Final    NONE Performed at [facility], [address] [place],  [place] [address]    Culture   Final    NO GROWTH Performed at [facility], [place] [address] [place], [place] [address]    Report Status 04/11/2020 FINAL  Final  MRSA PCR Screening     Status: None   Collection Time: 04/12/20  3:25 PM   Specimen: Nasopharyngeal  Result Value Ref Range Status   MRSA by PCR NEGATIVE NEGATIVE Final    Comment:        The GeneXpert MRSA Assay (FDA approved for NASAL specimens only), is one component of a comprehensive MRSA colonization surveillance program. It is not intended to diagnose MRSA infection nor to guide or monitor treatment for MRSA infections. Performed at [facility], [address], [place], [place] [address]         Radiology Studies: DG Swallowing [facility] Pathology  Result Date: 04/19/2020 Objective Swallowing Evaluation: Type of Study: MBS-Modified Barium Swallow Study  Patient Details Name: SINA SUMPTER MRN: [id number] Date of Birth: [birth year] Today's Date: 04/19/2020 Time: SLP  Start Time (ACUTE ONLY): 0805 -SLP Stop Time (ACUTE ONLY): 0825 SLP Time Calculation (min) (ACUTE ONLY): 20 min Past Medical History: Past Medical History: Diagnosis Date . Abnormal LFTs (liver function tests)  . Anxiety  . Arthritis   knees, feet . Asthma  . Chronic bronchitis ([place])  . Chronic constipation  . Chronic low back pain  . Cigarette smoker   Has cut back Smoking to 1 pack every other day . COPD (chronic obstructive pulmonary disease) ([place])  . Depression  . Diabetic neuropathy ([place])  . Diverticulitis 2015  gi recommended repeat scope in 10 years . Essential hypertension  . Fatty liver  . GERD (gastroesophageal reflux disease)  . Gout  . History of mammogram 05/28/2013 . Hyperlipidemia  . Hypothyroidism  . Insomnia  . Iron deficiency anemia  . Low serum vitamin D  . Personal history of tobacco use, presenting hazards to health 09/29/2015 .  Plantar fasciitis  . RLS (restless legs syndrome)  . Shortness of breath dyspnea  . Thyroid cancer ([name]) 2009 . Tremors of nervous system   "I think I have parkinson's disease" . Type 2 diabetes mellitus ([place])  . Wears dentures   uppers Past Surgical History: Past Surgical History: Procedure Laterality Date . CATARACT EXTRACTION W/PHACO Left 11/24/2015  Procedure: CATARACT EXTRACTION PHACO AND INTRAOCULAR LENS PLACEMENT (IOC);  Surgeon: Birder Robson, MD;  Location: [facility] ORS;  Service: Ophthalmology;  Laterality: Left;  [place] 38.3 AP% 20.6 CDE 7.89 FLUID PACK LOT # [id number] . CATARACT EXTRACTION W/PHACO Right 06/23/2019  Procedure: CATARACT EXTRACTION PHACO AND INTRAOCULAR LENS PLACEMENT ([place]) RIGHT DIABETIC;  Surgeon: Birder Robson, MD;  Location: [place];  Service: Ophthalmology;  Laterality: Right;  Diabetic . COLONOSCOPY  2004, 2009, 2015  [place] . POLYPECTOMY  2009 . THYROIDECTOMY  2009  states she was tx with radiactive iodine . TUBAL LIGATION   . UPPER GASTROINTESTINAL ENDOSCOPY   HPI: Pt  is a 74 y.o. female with a medical history significant for  Multiple issues including GERD, restless leg syndrome, essential tremors, HTN, DM, COPD, tobacco use, gout, depression/anxiety and chronic lower extremity edema/cellulitis.  Patient [facility] ED [facility]/03/20 for leg edema and worsening shortness of breath and was found with O2 saturations in the 80's. She was found to have a BNP of 1113 and imaging showed mild cardiomegaly and pulmonary edema. Her lab work also showed worsening proteinuria. She was admitted for further work up.  per Pulmonary, pt has dx of Acute on chronic hypoxemic respiratory failure; BiPAP at night, on increased [place] O2 support during the day.  Pt has some degree of Cognitive decline - decreased attention and follow through w/ tasks w/out cues; often calling out Loudly.  CXR: Unchanged multifocal patchy airspace opacities within both lungs  which could be due to multifocal pneumonia.  Subjective: Bloody sputum on pt's chin and inside her mask when she arrived Assessment / Plan / Recommendation CHL IP CLINICAL IMPRESSIONS 04/19/2020 Clinical Impression Patient presents with moderately severe oropharyngeal dysphagia, with trace silent aspiration of thin and nectar thick liquids and laryngeal penetration of honey thick liquids and puree. When pt arrived to fluoro, she had coughed bloody-tinged secretions into her mask. Oral stage is characterized by sluggish, discoordinated bolus formation, decreased bolus cohesion with premature spillage to the pharynx. Swallow initiation is delayed to the level of the pyriform sinuses, resulting in delayed laryngeal closure and penetration of all consistencies before the swallow, aspiration of thin and nectar thick liquids during the swallow which were not sensed by the patient. Penetration/aspiration appears to occur due to impaired timing/sensation vs pharyngeal weakness. She is only intermittently able to follow commands for throat clear/cough, which was marginally effective at clearing shallower  penetration. There is intermittent mild residue in the oral cavity, with spillover occurring to the valleculae between frames. A chin tuck was attempted, with max cues required; this did not prevent penetration or aspiration (with nectar). Smaller (1/2-1 tsp) size boluses of thicker consistencies were better contained in the valleculae, reducing potential for aspiration from premature spill.  Recommend dys 1 (puree) and honey thick liquids by teaspoon, with assist for self-feeding and cuing for throat clearing intermittently to eject laryngeal penetration. Patient is at risk for aspiration regardless of consistency, although there may be some potential for improvement if her mental status improves. SLP Visit Diagnosis Dysphagia, oropharyngeal phase (R13.12) Attention and concentration deficit following -- Frontal lobe and executive  function deficit following -- Impact on safety and function Moderate aspiration risk;Severe aspiration risk   CHL IP TREATMENT RECOMMENDATION 04/19/2020 Treatment Recommendations Therapy as outlined in treatment plan below   Prognosis 04/19/2020 Prognosis for Safe Diet Advancement Fair Barriers to Reach Goals Time post onset;Severity of deficits;Behavior Barriers/Prognosis Comment -- CHL IP DIET RECOMMENDATION 04/19/2020 SLP Diet Recommendations Dysphagia 1 (Puree) solids;Honey thick liquids Liquid Administration via Spoon Medication Administration Crushed with puree Compensations Minimize environmental distractions;Slow rate;Small sips/bites;Lingual sweep for clearance of pocketing;Clear throat intermittently Postural Changes Seated upright at 90 degrees   CHL IP OTHER RECOMMENDATIONS 04/19/2020 Recommended Consults -- Oral Care Recommendations Oral care QID Other Recommendations Order thickener from pharmacy;Prohibited food (jello, ice cream, thin soups)   CHL IP FOLLOW UP RECOMMENDATIONS 04/19/2020 Follow up Recommendations Other (comment)   CHL IP FREQUENCY AND DURATION 04/19/2020  Speech Therapy Frequency (ACUTE ONLY) min 2x/week Treatment Duration 1 week      CHL IP ORAL PHASE 04/19/2020 Oral Phase Impaired Oral - Pudding Teaspoon -- Oral - Pudding Cup -- Oral - Honey Teaspoon Lingual/palatal residue;Decreased bolus cohesion;Premature spillage Oral - Honey Cup NT Oral - Nectar Teaspoon Lingual/palatal residue;Decreased bolus cohesion;Premature spillage Oral - Nectar Cup Lingual/palatal residue;Decreased bolus cohesion;Premature spillage Oral - Nectar Straw NT Oral - Thin Teaspoon Lingual/palatal residue;Decreased bolus cohesion;Premature spillage Oral - Thin Cup Lingual/palatal residue;Decreased bolus cohesion;Premature spillage Oral - Thin Straw NT Oral - Puree Lingual/palatal residue;Decreased bolus cohesion;Premature spillage Oral - Mech Soft -- Oral - Regular -- Oral - Multi-Consistency -- Oral - Pill -- Oral Phase - Comment --  CHL IP PHARYNGEAL PHASE 04/19/2020 Pharyngeal Phase Impaired Pharyngeal- Pudding Teaspoon -- Pharyngeal -- Pharyngeal- Pudding Cup -- Pharyngeal -- Pharyngeal- Honey Teaspoon Delayed swallow initiation-pyriform sinuses;Penetration/Aspiration before swallow Pharyngeal Material enters airway, remains ABOVE vocal cords then ejected out;Material enters airway, remains ABOVE vocal cords and not ejected out Pharyngeal- Honey Cup NT Pharyngeal -- Pharyngeal- Nectar Teaspoon Delayed swallow initiation-pyriform sinuses;Penetration/Aspiration before swallow;Penetration/Aspiration during swallow;Trace aspiration;Compensatory strategies attempted (with notebox) Pharyngeal Material enters airway, remains ABOVE vocal cords and not ejected out;Material enters airway, passes BELOW cords without attempt by patient to eject out (silent aspiration) Pharyngeal- Nectar Cup Delayed swallow initiation-pyriform sinuses;Penetration/Aspiration before swallow;Penetration/Aspiration during swallow;Trace aspiration Pharyngeal Material enters airway, remains ABOVE vocal cords and not ejected  out;Material enters airway, passes BELOW cords without attempt by patient to eject out (silent aspiration) Pharyngeal- Nectar Straw NT Pharyngeal -- Pharyngeal- Thin Teaspoon Delayed swallow initiation-pyriform sinuses;Penetration/Aspiration before swallow Pharyngeal Material enters airway, remains ABOVE vocal cords and not ejected out Pharyngeal- Thin Cup Delayed swallow initiation-pyriform sinuses;Penetration/Aspiration before swallow;Penetration/Aspiration during swallow;Trace aspiration Pharyngeal Material enters airway, remains ABOVE vocal cords and not ejected out;Material enters airway, passes BELOW cords without attempt by patient to eject out (silent aspiration) Pharyngeal- Thin Straw NT Pharyngeal -- Pharyngeal- Puree Delayed swallow initiation-pyriform sinuses;Penetration/Aspiration before swallow Pharyngeal Material enters airway, remains ABOVE vocal cords then ejected out;Material enters airway, remains ABOVE vocal cords and not ejected out Pharyngeal- Mechanical Soft -- Pharyngeal -- Pharyngeal- Regular -- Pharyngeal -- Pharyngeal- Multi-consistency -- Pharyngeal -- Pharyngeal- Pill -- Pharyngeal -- Pharyngeal Comment --  CHL IP CERVICAL ESOPHAGEAL PHASE 04/19/2020 Cervical Esophageal Phase WFL Pudding Teaspoon -- Pudding Cup -- Honey Teaspoon -- Honey Cup -- Nectar Teaspoon -- Nectar Cup -- Nectar Straw -- Thin Teaspoon -- Thin Cup -- Thin Straw -- Puree -- Mechanical Soft -- Regular -- Multi-consistency -- Pill -- Cervical Esophageal Comment -- Martha Lever, MS, CCC-SLP Speech-Language Pathologist Martha [name] 04/19/2020, 12:17 PM  Scheduled Meds: . amLODipine  10 mg Oral Daily  . vitamin C  1,000 mg Oral Daily  . carvedilol  12.5 mg Oral BID WC  . chlorhexidine  15 mL Mouth Rinse BID  . Chlorhexidine Gluconate Cloth  6 each Topical Daily  . enoxaparin (LOVENOX) injection  40 mg Subcutaneous Q24H  . fluticasone  1 spray Each Nare BID  . insulin aspart  0-5 Units  Subcutaneous QHS  . insulin aspart  0-9 Units Subcutaneous TID WC  . insulin glargine  20 Units Subcutaneous Daily  . ipratropium-albuterol  3 mL Nebulization TID  . levothyroxine  150 mcg Oral Q0600  . losartan  100 mg Oral Daily  . mouth rinse  15 mL Mouth Rinse q12n4p  . multivitamin with minerals  1 tablet Oral Daily  . pantoprazole  40 mg Oral Daily  . predniSONE  20 mg Oral Q breakfast  . primidone  50 mg Oral BID  . rosuvastatin  5 mg Oral Daily  . sodium chloride flush  3 mL Intravenous Q12H  . vitamin B-12  1,000 mcg Oral Daily   Continuous Infusions: . sodium chloride 250 mL (04/15/20 1256)     LOS: 16 days     Cordelia Poche, MD [facility] 04/19/2020, 2:10 PM  If 7PM-7AM, please contact night-coverage [email or web address]

## 2020-04-19 NOTE — Progress Notes (Signed)
[facility]  ROUNDING NOTE   Subjective:   Patient resting in bed, denies worsening SOB, nausea or vomiting. Overall edema appears to be improving States she wants to go home   Objective:  Vital signs in last 24 hours:  Temp:  [97.9 F (36.6 C)-98.8 F (37.1 C)] 97.9 F (36.6 C) (12/21 1157) Pulse Rate:  [53-62] 60 (12/21 1157) Resp:  [18] 18 (12/21 1157) BP: (129-154)/(49-61) 134/53 (12/21 1157) SpO2:  [87 %-95 %] 87 % (12/21 1304) Weight:  [66.8 kg] 66.8 kg (12/21 0414)  Weight change:  Filed Weights   04/16/20 0448 04/17/20 0340 04/19/20 0414  Weight: 68.7 kg 67 kg 66.8 kg    Intake/Output: I/O last 3 completed shifts: In: 25 [P.O.:25] Out: 151 [Urine:150; Stool:1]   Intake/Output this shift:  Total I/O In: 0  Out: 125 [Urine:125]  Physical Exam: General:  Resting in bed, in no acute distress  Head: Normocephalic, atraumatic  Eyes:  Sclerae and conjunctivae clear  Lungs:   Mild rhonchi bilaterally, Respirations symmetrical, unlabored  On 5-6 L O2   Heart:  S1S2 , no rubs or gallops  Abdomen:   Soft, nontender, nondistended  Extremities:  + peripheral edema  Neurologic:  Awake, alert, appears slightly lethargic  Skin:  Areas of ecchymoses on bilateral arms,resolving, bilateral lower legs wrapped    Basic Metabolic Panel: Recent Labs  Lab 04/14/20 0530 04/15/20 0417 04/16/20 0440 04/17/20 0354 04/18/20 0451 04/19/20 0544  NA 145 142 137 132*  --  133*  K 4.3 4.4 3.9 3.5  --  3.1*  CL 99 101 95* 93*  --  94*  CO2 35* 31 33* 31  --  31  GLUCOSE 173* 98 109* 94  --  79  BUN 29* 26* 27* 28*  --  20  CREATININE 0.47 0.50 0.42* 0.54  --  0.41*  CALCIUM 9.2 9.2 8.6* 8.4*  --  8.1*  MG 2.3 2.2 2.1 2.2 2.0 2.0    Liver Function Tests: Recent Labs  Lab 04/13/20 0441 04/14/20 0530 04/15/20 0417  AST 18 35 27  ALT 27 41 44  ALKPHOS 77 118 150*  BILITOT 1.6* 1.3* 1.2  PROT 6.3* 6.3* 6.0*  ALBUMIN 3.8 3.8 3.5   No results for  input(s): LIPASE, AMYLASE in the last 168 hours. No results for input(s): AMMONIA in the last 168 hours.  CBC: Recent Labs  Lab 04/14/20 0530 04/15/20 0417 04/16/20 0440 04/17/20 0354 04/19/20 0544  WBC 14.1* 14.0* 10.6* 11.7* 8.6  HGB 14.9 14.3 13.3 13.4 13.3  HCT 47.3* 45.6 42.0 41.4 40.5  MCV 90.6 91.0 89.6 87.3 86.0  PLT 188 180 188 222 214    Cardiac Enzymes: No results for input(s): CKTOTAL, CKMB, CKMBINDEX, TROPONINI in the last 168 hours.  BNP: Invalid input(s): POCBNP  CBG: Recent Labs  Lab 04/18/20 1157 04/18/20 1741 04/18/20 2046 04/19/20 0913 04/19/20 1159  GLUCAP 133* 219* 230* 106* 156*    Microbiology: Results for orders placed or performed during the hospital encounter of 04/03/20  Resp Panel by RT-PCR (Flu A&B, Covid) Nasopharyngeal Swab     Status: None   Collection Time: 04/03/20 11:41 AM   Specimen: Nasopharyngeal Swab; Nasopharyngeal(NP) swabs in vial transport medium  Result Value Ref Range Status   SARS Coronavirus 2 by RT PCR NEGATIVE NEGATIVE Final    Comment: (NOTE) SARS-CoV-2 target nucleic acids are NOT DETECTED.  The SARS-CoV-2 RNA is generally detectable in upper respiratory specimens during the acute phase of  infection. The lowest concentration of SARS-CoV-2 viral copies this assay can detect is 138 copies/mL. A negative result does not preclude SARS-Cov-2 infection and should not be used as the sole basis for treatment or other patient management decisions. A negative result may occur with  improper specimen collection/handling, submission of specimen other than nasopharyngeal swab, presence of viral mutation(s) within the areas targeted by this assay, and inadequate number of viral copies(<138 copies/mL). A negative result must be combined with clinical observations, patient history, and epidemiological information. The expected result is Negative.  Fact Sheet for Patients:  EntrepreneurPulse.com.au  Fact  Sheet for Healthcare Providers:  IncredibleEmployment.be  This test is no t yet approved or cleared by the [place] FDA and  has been authorized for detection and/or diagnosis of SARS-CoV-2 by FDA under an Emergency Use Authorization (EUA). This EUA will remain  in effect (meaning this test can be used) for the duration of the COVID-19 declaration under Section 564(b)(1) of the Act, 21 U.S.C.section 360bbb-3(b)(1), unless the authorization is terminated  or revoked sooner.       Influenza A by PCR NEGATIVE NEGATIVE Final   Influenza B by PCR NEGATIVE NEGATIVE Final    Comment: (NOTE) The Xpert Xpress SARS-CoV-2/FLU/RSV plus assay is intended as an aid in the diagnosis of influenza from Nasopharyngeal swab specimens and should not be used as a sole basis for treatment. Nasal washings and aspirates are unacceptable for Xpert Xpress SARS-CoV-2/FLU/RSV testing.  Fact Sheet for Patients: EntrepreneurPulse.com.au  Fact Sheet for Healthcare Providers: IncredibleEmployment.be  This test is not yet approved or cleared by the [place] FDA and has been authorized for detection and/or diagnosis of SARS-CoV-2 by FDA under an Emergency Use Authorization (EUA). This EUA will remain in effect (meaning this test can be used) for the duration of the COVID-19 declaration under Section 564(b)(1) of the Act, 21 U.S.C. section 360bbb-3(b)(1), unless the authorization is terminated or revoked.  Performed at [facility], [address] [place], [place] [address]   Culture, blood (Routine X 2) w Reflex to ID Panel     Status: None   Collection Time: 04/03/20  3:27 PM   Specimen: BLOOD  Result Value Ref Range Status   Specimen Description BLOOD BLOOD RIGHT ARM  Final   Special Requests   Final    BOTTLES DRAWN AEROBIC AND ANAEROBIC Blood Culture adequate volume   Culture   Final    NO GROWTH 5 DAYS Performed at [facility], [address] [place], [place] [address]    Report Status 04/08/2020 FINAL  Final  Urine Culture     Status: Abnormal   Collection Time: 04/03/20  4:00 PM   Specimen: Urine, Random  Result Value Ref Range Status   Specimen Description   Final    URINE, RANDOM Performed at [facility], [address] [place], [place] [address]    Special Requests   Final    NONE Performed at [facility], [address] [place], [place] [address]    Culture (A)  Final    >=100,000 COLONIES/mL ESCHERICHIA COLI >=100,000 COLONIES/mL AEROCOCCUS URINAE Standardized susceptibility testing for this organism is not available. Performed at [facility], [place] [address], [place],  [address]    Report Status 04/07/2020 FINAL  Final   Organism ID, Bacteria ESCHERICHIA COLI (A)  Final      Susceptibility   Escherichia coli - MIC*    AMPICILLIN 4 SENSITIVE Sensitive  CEFAZOLIN <=4 SENSITIVE Sensitive     CEFEPIME <=0.12 SENSITIVE Sensitive     CEFTRIAXONE <=0.25 SENSITIVE Sensitive     CIPROFLOXACIN <=0.25 SENSITIVE Sensitive     GENTAMICIN <=1 SENSITIVE Sensitive     IMIPENEM <=0.25 SENSITIVE Sensitive     NITROFURANTOIN 32 SENSITIVE Sensitive     TRIMETH/SULFA <=20 SENSITIVE Sensitive     AMPICILLIN/SULBACTAM <=2 SENSITIVE Sensitive     PIP/TAZO <=4 SENSITIVE Sensitive     * >=100,000 COLONIES/mL ESCHERICHIA COLI  Culture, blood (Routine X 2) w Reflex to ID Panel     Status: None   Collection Time: 04/03/20 10:36 PM   Specimen: BLOOD  Result Value Ref Range Status   Specimen Description BLOOD BLOOD LEFT HAND  Final   Special Requests   Final    BOTTLES DRAWN AEROBIC AND ANAEROBIC Blood Culture adequate volume   Culture   Final    NO GROWTH 5 DAYS Performed at [facility], [address], [place], [place] [address]    Report Status 04/08/2020 FINAL  Final  Urine Culture     Status: None   Collection Time: 04/08/20  9:52 AM    Specimen: Urine, Random  Result Value Ref Range Status   Specimen Description   Final    URINE, RANDOM Performed at [facility], [address], [place], [place] [address]    Special Requests   Final    NONE Performed at [facility], [address], [place], [place] [address]    Culture   Final    NO GROWTH Performed at [facility], [place] [address], [place], [place] [address]    Report Status 04/09/2020 FINAL  Final  CULTURE, BLOOD (ROUTINE X 2) w Reflex to ID Panel     Status: None   Collection Time: 04/08/20 10:02 AM   Specimen: BLOOD  Result Value Ref Range Status   Specimen Description BLOOD BLOOD RIGHT HAND  Final   Special Requests   Final    BOTTLES DRAWN AEROBIC AND ANAEROBIC Blood Culture results may not be optimal due to an inadequate volume of blood received in culture bottles   Culture   Final    NO GROWTH 5 DAYS Performed at [facility], [address] [place], [place] [address]    Report Status 04/13/2020 FINAL  Final  MRSA PCR Screening     Status: None   Collection Time: 04/08/20 10:04 AM   Specimen: Nasal Mucosa; Nasopharyngeal  Result Value Ref Range Status   MRSA by PCR NEGATIVE NEGATIVE Final    Comment:        The GeneXpert MRSA Assay (FDA approved for NASAL specimens only), is one component of a comprehensive MRSA colonization surveillance program. It is not intended to diagnose MRSA infection nor to guide or monitor treatment for MRSA infections. Performed at [facility], [address] [place], [place] [address]   CULTURE, BLOOD (ROUTINE X 2) w Reflex to ID Panel     Status: None   Collection Time: 04/08/20 10:26 AM   Specimen: BLOOD  Result Value Ref Range Status   Specimen Description BLOOD BLOOD RIGHT HAND  Final   Special Requests   Final    BOTTLES DRAWN AEROBIC AND ANAEROBIC Blood Culture results may not be optimal due to an inadequate volume of blood received in culture bottles   Culture    Final    NO GROWTH 5 DAYS Performed at [facility], [place]  [address], [place], [place] [address]    Report Status 04/13/2020 FINAL  Final  Urine Culture     Status: None   Collection Time: 04/10/20  6:21 PM   Specimen: Urine, Random  Result Value Ref Range Status   Specimen Description   Final    URINE, RANDOM Performed at [facility], [address] [place], [place] [address]    Special Requests   Final    NONE Performed at [facility], [address] [place], [place] [address]    Culture   Final    NO GROWTH Performed at [facility], [place] [address] [place], [place] [address]    Report Status 04/11/2020 FINAL  Final  MRSA PCR Screening     Status: None   Collection Time: 04/12/20  3:25 PM   Specimen: Nasopharyngeal  Result Value Ref Range Status   MRSA by PCR NEGATIVE NEGATIVE Final    Comment:        The GeneXpert MRSA Assay (FDA approved for NASAL specimens only), is one component of a comprehensive MRSA colonization surveillance program. It is not intended to diagnose MRSA infection nor to guide or monitor treatment for MRSA infections. Performed at [facility], [address] [place], [place] [address]     Coagulation Studies: No results for input(s): LABPROT, INR in the last 72 hours.  Urinalysis: No results for input(s): COLORURINE, LABSPEC, PHURINE, GLUCOSEU, HGBUR, BILIRUBINUR, KETONESUR, PROTEINUR, UROBILINOGEN, NITRITE, LEUKOCYTESUR in the last 72 hours.  Invalid input(s): APPERANCEUR    Imaging: DG Swallowing Func-Speech Pathology  Result Date: 04/19/2020 Objective Swallowing Evaluation: Type of Study: MBS-Modified Barium Swallow Study  Patient Details Name: ASHLEI SARAGOSA MRN: [id number] Date of Birth: 06-16-1945 Today's Date: 04/19/2020 Time: SLP Start Time (ACUTE ONLY): 0805 -SLP Stop Time (ACUTE ONLY): 0825 SLP Time Calculation (min) (ACUTE ONLY): 20 min Past Medical History: Past Medical  History: Diagnosis Date . Abnormal LFTs (liver function tests)  . Anxiety  . Arthritis   knees, feet . Asthma  . Chronic bronchitis ([place])  . Chronic constipation  . Chronic low back pain  . Cigarette smoker   Has cut back Smoking to 1 pack every other day . COPD (chronic obstructive pulmonary disease) ([place])  . Depression  . Diabetic neuropathy ([place])  . Diverticulitis 2015  gi recommended repeat scope in 10 years . Essential hypertension  . Fatty liver  . GERD (gastroesophageal reflux disease)  . Gout  . History of mammogram 05/28/2013 . Hyperlipidemia  . Hypothyroidism  . Insomnia  . Iron deficiency anemia  . Low serum vitamin D  . Personal history of tobacco use, presenting hazards to health 09/29/2015 . Plantar fasciitis  . RLS (restless legs syndrome)  . Shortness of breath dyspnea  . Thyroid cancer ([place]) 2009 . Tremors of nervous system   "I think I have parkinson's disease" . Type 2 diabetes mellitus ([place])  . Wears dentures   uppers Past Surgical History: Past Surgical History: Procedure Laterality Date . CATARACT EXTRACTION W/PHACO Left 11/24/2015  Procedure: CATARACT EXTRACTION PHACO AND INTRAOCULAR LENS PLACEMENT (IOC);  Surgeon: Birder Robson, MD;  Location: [facility] ORS;  Service: Ophthalmology;  Laterality: Left;  [place] 38.3 AP% 20.6 CDE 7.89 FLUID PACK LOT # [id number] H . CATARACT EXTRACTION W/PHACO Right 06/23/2019  Procedure: CATARACT EXTRACTION PHACO AND INTRAOCULAR LENS PLACEMENT ([place]) RIGHT DIABETIC;  Surgeon: Birder Robson, MD;  Location: [place];  Service: Ophthalmology;  Laterality: Right;  Diabetic . COLONOSCOPY  2004, 2009,  2015  [place] . POLYPECTOMY  2009 . THYROIDECTOMY  2009  states she was tx with radiactive iodine . TUBAL LIGATION   . UPPER GASTROINTESTINAL ENDOSCOPY   HPI: Pt  is a 74 y.o. female with a medical history significant for Multiple issues including GERD, restless leg syndrome, essential tremors, HTN, DM, COPD, tobacco use, gout, depression/anxiety and chronic lower  extremity edema/cellulitis.  Patient presented to the [facility] ED 04/03/20 for leg edema and worsening shortness of breath and was found with O2 saturations in the 80's. She was found to have a BNP of 1113 and imaging showed mild cardiomegaly and pulmonary edema. Her lab work also showed worsening proteinuria. She was admitted for further work up.  per Pulmonary, pt has dx of Acute on chronic hypoxemic respiratory failure; BiPAP at night, on increased Keys O2 support during the day.  Pt has some degree of Cognitive decline - decreased attention and follow through w/ tasks w/out cues; often calling out Loudly.  CXR: Unchanged multifocal patchy airspace opacities within both lungs  which could be due to multifocal pneumonia.  Subjective: Bloody sputum on pt's chin and inside her mask when she arrived Assessment / Plan / Recommendation CHL IP CLINICAL IMPRESSIONS 04/19/2020 Clinical Impression Patient presents with moderately severe oropharyngeal dysphagia, with trace silent aspiration of thin and nectar thick liquids and laryngeal penetration of honey thick liquids and puree. When pt arrived to fluoro, she had coughed bloody-tinged secretions into her mask. Oral stage is characterized by sluggish, discoordinated bolus formation, decreased bolus cohesion with premature spillage to the pharynx. Swallow initiation is delayed to the level of the pyriform sinuses, resulting in delayed laryngeal closure and penetration of all consistencies before the swallow, aspiration of thin and nectar thick liquids during the swallow which were not sensed by the patient. Penetration/aspiration appears to occur due to impaired timing/sensation vs pharyngeal weakness. She is only intermittently able to follow commands for throat clear/cough, which was marginally effective at clearing shallower penetration. There is intermittent mild residue in the oral cavity, with spillover occurring to the valleculae between frames. A chin tuck was attempted,  with max cues required; this did not prevent penetration or aspiration (with nectar). Smaller (1/2-1 tsp) size boluses of thicker consistencies were better contained in the valleculae, reducing potential for aspiration from premature spill.  Recommend dys 1 (puree) and honey thick liquids by teaspoon, with assist for self-feeding and cuing for throat clearing intermittently to eject laryngeal penetration. Patient is at risk for aspiration regardless of consistency, although there may be some potential for improvement if her mental status improves. SLP Visit Diagnosis Dysphagia, oropharyngeal phase (R13.12) Attention and concentration deficit following -- Frontal lobe and executive function deficit following -- Impact on safety and function Moderate aspiration risk;Severe aspiration risk   CHL IP TREATMENT RECOMMENDATION 04/19/2020 Treatment Recommendations Therapy as outlined in treatment plan below   Prognosis 04/19/2020 Prognosis for Safe Diet Advancement Fair Barriers to Reach Goals Time post onset;Severity of deficits;Behavior Barriers/Prognosis Comment -- CHL IP DIET RECOMMENDATION 04/19/2020 SLP Diet Recommendations Dysphagia 1 (Puree) solids;Honey thick liquids Liquid Administration via Spoon Medication Administration Crushed with puree Compensations Minimize environmental distractions;Slow rate;Small sips/bites;Lingual sweep for clearance of pocketing;Clear throat intermittently Postural Changes Seated upright at 90 degrees   CHL IP OTHER RECOMMENDATIONS 04/19/2020 Recommended Consults -- Oral Care Recommendations Oral care QID Other Recommendations Order thickener from pharmacy;Prohibited food (jello, ice cream, thin soups)   CHL IP FOLLOW UP RECOMMENDATIONS 04/19/2020 Follow up Recommendations Other (comment)   CHL  IP FREQUENCY AND DURATION 04/19/2020 Speech Therapy Frequency (ACUTE ONLY) min 2x/week Treatment Duration 1 week      CHL IP ORAL PHASE 04/19/2020 Oral Phase Impaired Oral - Pudding Teaspoon --  Oral - Pudding Cup -- Oral - Honey Teaspoon Lingual/palatal residue;Decreased bolus cohesion;Premature spillage Oral - Honey Cup NT Oral - Nectar Teaspoon Lingual/palatal residue;Decreased bolus cohesion;Premature spillage Oral - Nectar Cup Lingual/palatal residue;Decreased bolus cohesion;Premature spillage Oral - Nectar Straw NT Oral - Thin Teaspoon Lingual/palatal residue;Decreased bolus cohesion;Premature spillage Oral - Thin Cup Lingual/palatal residue;Decreased bolus cohesion;Premature spillage Oral - Thin Straw NT Oral - Puree Lingual/palatal residue;Decreased bolus cohesion;Premature spillage Oral - Mech Soft -- Oral - Regular -- Oral - Multi-Consistency -- Oral - Pill -- Oral Phase - Comment --  CHL IP PHARYNGEAL PHASE 04/19/2020 Pharyngeal Phase Impaired Pharyngeal- Pudding Teaspoon -- Pharyngeal -- Pharyngeal- Pudding Cup -- Pharyngeal -- Pharyngeal- Honey Teaspoon Delayed swallow initiation-pyriform sinuses;Penetration/Aspiration before swallow Pharyngeal Material enters airway, remains ABOVE vocal cords then ejected out;Material enters airway, remains ABOVE vocal cords and not ejected out Pharyngeal- Honey Cup NT Pharyngeal -- Pharyngeal- Nectar Teaspoon Delayed swallow initiation-pyriform sinuses;Penetration/Aspiration before swallow;Penetration/Aspiration during swallow;Trace aspiration;Compensatory strategies attempted (with notebox) Pharyngeal Material enters airway, remains ABOVE vocal cords and not ejected out;Material enters airway, passes BELOW cords without attempt by patient to eject out (silent aspiration) Pharyngeal- Nectar Cup Delayed swallow initiation-pyriform sinuses;Penetration/Aspiration before swallow;Penetration/Aspiration during swallow;Trace aspiration Pharyngeal Material enters airway, remains ABOVE vocal cords and not ejected out;Material enters airway, passes BELOW cords without attempt by patient to eject out (silent aspiration) Pharyngeal- Nectar Straw NT Pharyngeal --  Pharyngeal- Thin Teaspoon Delayed swallow initiation-pyriform sinuses;Penetration/Aspiration before swallow Pharyngeal Material enters airway, remains ABOVE vocal cords and not ejected out Pharyngeal- Thin Cup Delayed swallow initiation-pyriform sinuses;Penetration/Aspiration before swallow;Penetration/Aspiration during swallow;Trace aspiration Pharyngeal Material enters airway, remains ABOVE vocal cords and not ejected out;Material enters airway, passes BELOW cords without attempt by patient to eject out (silent aspiration) Pharyngeal- Thin Straw NT Pharyngeal -- Pharyngeal- Puree Delayed swallow initiation-pyriform sinuses;Penetration/Aspiration before swallow Pharyngeal Material enters airway, remains ABOVE vocal cords then ejected out;Material enters airway, remains ABOVE vocal cords and not ejected out Pharyngeal- Mechanical Soft -- Pharyngeal -- Pharyngeal- Regular -- Pharyngeal -- Pharyngeal- Multi-consistency -- Pharyngeal -- Pharyngeal- Pill -- Pharyngeal -- Pharyngeal Comment --  CHL IP CERVICAL ESOPHAGEAL PHASE 04/19/2020 Cervical Esophageal Phase WFL Pudding Teaspoon -- Pudding Cup -- Honey Teaspoon -- Honey Cup -- Nectar Teaspoon -- Nectar Cup -- Nectar Straw -- Thin Teaspoon -- Thin Cup -- Thin Straw -- Puree -- Mechanical Soft -- Regular -- Multi-consistency -- Pill -- Cervical Esophageal Comment -- Deneise Lever, MS, CCC-SLP Speech-Language Pathologist ARMANDE LEYVAS 04/19/2020, 12:17 PM                Medications:   . sodium chloride 250 mL (04/15/20 1256)   . amLODipine  10 mg Oral Daily  . vitamin C  1,000 mg Oral Daily  . carvedilol  12.5 mg Oral BID WC  . chlorhexidine  15 mL Mouth Rinse BID  . Chlorhexidine Gluconate Cloth  6 each Topical Daily  . enoxaparin (LOVENOX) injection  40 mg Subcutaneous Q24H  . fluticasone  1 spray Each Nare BID  . insulin aspart  0-5 Units Subcutaneous QHS  . insulin aspart  0-9 Units Subcutaneous TID WC  . insulin glargine  20 Units Subcutaneous  Daily  . ipratropium-albuterol  3 mL Nebulization TID  . levothyroxine  150 mcg Oral Q0600  . losartan  100 mg Oral Daily  . mouth rinse  15 mL Mouth Rinse q12n4p  . multivitamin with minerals  1 tablet Oral Daily  . pantoprazole  40 mg Oral Daily  . predniSONE  20 mg Oral Q breakfast  . primidone  50 mg Oral BID  . rosuvastatin  5 mg Oral Daily  . sodium chloride flush  3 mL Intravenous Q12H  . vitamin B-12  1,000 mcg Oral Daily   sodium chloride, acetaminophen, acetaminophen, albuterol, calcium carbonate, dextromethorphan-guaiFENesin, fluticasone, labetalol, nitroGLYCERIN, ondansetron (ZOFRAN) IV, prochlorperazine, sodium chloride flush  Assessment/ Plan:   [name] is a 74 y.o. 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  #Proteinuria, 17 g.   # Nephrotic syndrome with anasarca.   At present, it does not appear that patient would be able to lay on her abdomen for a renal biopsy. Anti Phospholipase A2 receptor Autoantibody/ [facility]- [id number] pending Continue oral diuretic therapy as needed.  Patient has furosemide 40 mg tablets at home.  Can take as needed  #Acute hypoxic respiratory failure  With right lower lobe pneumonia.  Antibiotics per primary team Symptoms improving gradually  #Hypertension Continue  amlodipine, losartan and carvedilol  #Diabetes mellitus type 2 Lab Results  Component Value Date   HGBA1C 6.5 (H) 04/03/2020   Current antihyperglycemic management include Insulin Aspart and Insulin Glargine   LOS: 16 Jhamir Pickup 12/21/20212:31 PM

## 2020-04-19 NOTE — Progress Notes (Signed)
Pt has had decreased output during shift, pt not eating or drinking much, per pt feels like she needs to urinate but unable to. Bladder scanned yield 312cc. Dr. Lonny Prude notified, per MD do I&O cath, if comfort measures tomorrow, would place foley. Palliative to meet with daughter tomorrow at 9:30 a.m.

## 2020-04-19 NOTE — Consult Note (Addendum)
Consultation Note Date: 04/19/2020   Patient Name: Martha [name]  DOB: 1945-07-15  MRN: [id number]  Age / Sex: 74 y.o., female  PCP: Tracie Harrier, MD Referring Physician: Mariel Aloe, MD  Reason for Consultation: Establishing goals of care and Psychosocial/spiritual support  HPI/Patient Profile: 74 y.o. female  with past medical history of HTN/HLD, DM, COPD, tobacco abuse-lifelong smoker-2.5 packs/day, hypothyroidism, gout, depression/anxiety, GERD, diverticulitis, anasarca with peripheral edema with weekly lower extremity wrapping admitted on 04/03/2020 with initially admitted with acute exacerbation of heart failure, also found to have protein in the urine with CKD, also acute on chronic hypoxemic respiratory failure.   Clinical Assessment and Goals of Care: I have reviewed medical records including EPIC notes, labs and imaging, discussed case with speech therapy, examined the patient and met at bedside with significant other of 30+ years, timing, to discuss diagnosis prognosis, GOC, EOL wishes, disposition and options.  Martha [name] is resting quietly in bed.  She will briefly make an somewhat keep eye contact.  She appears acutely/chronically ill and quite frail.  She is alert and oriented to person and place, and after little prompting, she is oriented to time.  She is able to make her needs known.  Martha [name] shares that he and Martha [name] daughter, Martha [name] alternate visiting days.  Martha [name] should be at bedside tomorrow.  I introduced Palliative Medicine as specialized medical care for people living with serious illness. It focuses on providing relief from the symptoms and stress of a serious illness.   We discussed a brief life review of the patient.  [name] worked in a [employer].  She has 3 adult children.  She and Martha [name] have been together/lived together for about 30 years.  As far as  functional and nutritional status, Martha [name] shares that [name] has been in declining health for quite some time, in particular over the last month  We discussed her current illness and what it means in the larger context of her on-going co-morbidities.  Natural disease trajectory and expectations at EOL were discussed.  I attempted to elicit values and goals of care important to the patient.    The difference between aggressive medical intervention and comfort care was considered in light of the patient's goals of care.   Advanced directives, concepts specific to code status, were considered and discussed.  Martha [name] and Martha [name] both state that [name] would not want life support/CPR.  They share that they have discussed this as a family, and Martha [name] is in agreement.  CODE STATUS changed.  Call to daughter/HC POA, Martha [name].  Martha [name] states that she has spoken with several doctors including attending.  She shares her concern over her mother's declines.  She talks about a marked decline since this hospital stay began.  She shares that her mother does not want to have any more invasive testing or treatments.  She agrees to DNR status.  We talked about disposition, home with hospice versus residential hospice.  Hospice and Palliative Care services outpatient were explained and  offered.  [name] may qualify for hospice care based on weight loss trend.  12/06 176 pounds, 12/11 152 pounds, 12/19 147 pounds  Questions and concerns were addressed.  The family was encouraged to call with questions or concerns.   Family meeting at bedside 12/22 at 10 AM   HCPOA   NEXT OF KIN -Ms. [name] names her adult daughter, Martha [name] as her healthcare surrogate.   Martha [name] has 3 adult children.  She names her daughter, Martha [name], who lives locally, as her healthcare surrogate.  She has 2 sons, Martha [name] who lives in [place], and Martha [name] who lives at the [employer].  Ms. Penny is unmarried.  She and  Martha [name] have lived together/been together for 30+ years.  Ms. Trostle has been in a long-term live-in relationship with her significant other, Martha [name], for 30 years.   SUMMARY OF RECOMMENDATIONS   Family meeting with patient and daughter 12/22 at 1 AM Considering home with hospice Considering residential hospice if qualified Declines further invasive testing  Code Status/Advance Care Planning:  DNR -  Advanced directives, concepts specific to code status, were considered and discussed.  Martha [name] both state that Martha [name] would not want life support/CPR.  They share that they have discussed this as a family, and Martha [name] is in agreement.  CODE STATUS changed.  Symptom Management:   Per hospitalist, no additional needs at this time.  Palliative Prophylaxis:   Aspiration, Oral Care and Turn Reposition  Additional Recommendations (Limitations, Scope, Preferences):  Treat the treatable but no CPR or intubation  Psycho-social/Spiritual:   Desire for further Chaplaincy support:no  Additional Recommendations: Caregiving  Support/Resources and Education on Hospice  Prognosis:   Unable to determine, based on outcomes and patient/family choice.  Weeks to months would not be surprising based on poor functional status, physical declines, frailty.  Discharge Planning: To be determined, based on outcomes.      Primary Diagnoses: Present on Admission: . COPD exacerbation ([place]) . Current tobacco use . Hyperlipidemia . Essential hypertension . Hypothyroidism . Gout . Depression . Tremors of nervous system . Acute respiratory failure with hypoxia ([place])   I have reviewed the medical record, interviewed the patient and family, and examined the patient. The following aspects are pertinent.  Past Medical History:  Diagnosis Date  . Abnormal LFTs (liver function tests)   . Anxiety   . Arthritis    knees, feet  . Asthma   . Chronic bronchitis ([place])   . Chronic constipation   .  Chronic low back pain   . Cigarette smoker    Has cut back Smoking to 1 pack every other day  . COPD (chronic obstructive pulmonary disease) ([place])   . Depression   . Diabetic neuropathy ([place])   . Diverticulitis 2015   gi recommended repeat scope in 10 years  . Essential hypertension   . Fatty liver   . GERD (gastroesophageal reflux disease)   . Gout   . History of mammogram 05/28/2013  . Hyperlipidemia   . Hypothyroidism   . Insomnia   . Iron deficiency anemia   . Low serum vitamin D   . Personal history of tobacco use, presenting hazards to health 09/29/2015  . Plantar fasciitis   . RLS (restless legs syndrome)   . Shortness of breath dyspnea   . Thyroid cancer ([place]) 2009  . Tremors of nervous system    "I think I have parkinson's disease"  . Type 2 diabetes mellitus ([place])   .  Wears dentures    uppers   Social History   Socioeconomic History  . Marital status: Divorced    Spouse name: Not on file  . Number of children: Not on file  . Years of education: Not on file  . Highest education level: Not on file  Occupational History  . Not on file  Tobacco Use  . Smoking status: Current Every Day Smoker    Packs/day: 1.00    Years: 39.50    Pack years: 39.50    Types: Cigarettes  . Smokeless tobacco: Never Used  . Tobacco comment: curently smokes 12 cigerattes a day. I'm trying to cut back.  Vaping Use  . Vaping Use: Never used  Substance and Sexual Activity  . Alcohol use: No    Alcohol/week: 0.0 standard drinks  . Drug use: No  . Sexual activity: Never  Other Topics Concern  . Not on file  Social History Narrative  . Not on file   Social Determinants of Health   Financial Resource Strain: Not on file  Food Insecurity: Not on file  Transportation Needs: Not on file  Physical Activity: Not on file  Stress: Not on file  Social Connections: Not on file   Family History  Problem Relation Age of Onset  . Diabetes Mellitus II Father   . Lung disease Father    . Diabetes Mellitus II Mother   . Arthritis Mother   . Thyroid disease Other   . Asthma Sister   . Emphysema Sister   . Hypertension Sister   . Breast cancer Paternal Aunt    Scheduled Meds: . amLODipine  10 mg Oral Daily  . vitamin C  1,000 mg Oral Daily  . carvedilol  12.5 mg Oral BID WC  . chlorhexidine  15 mL Mouth Rinse BID  . Chlorhexidine Gluconate Cloth  6 each Topical Daily  . enoxaparin (LOVENOX) injection  40 mg Subcutaneous Q24H  . fluticasone  1 spray Each Nare BID  . insulin aspart  0-5 Units Subcutaneous QHS  . insulin aspart  0-9 Units Subcutaneous TID WC  . insulin glargine  20 Units Subcutaneous Daily  . ipratropium-albuterol  3 mL Nebulization TID  . levothyroxine  150 mcg Oral Q0600  . losartan  100 mg Oral Daily  . mouth rinse  15 mL Mouth Rinse q12n4p  . multivitamin with minerals  1 tablet Oral Daily  . pantoprazole  40 mg Oral Daily  . predniSONE  20 mg Oral Q breakfast  . primidone  50 mg Oral BID  . rosuvastatin  5 mg Oral Daily  . sodium chloride flush  3 mL Intravenous Q12H  . vitamin B-12  1,000 mcg Oral Daily   Continuous Infusions: . sodium chloride 250 mL (04/15/20 1256)   PRN Meds:.sodium chloride, acetaminophen, acetaminophen, albuterol, calcium carbonate, dextromethorphan-guaiFENesin, fluticasone, labetalol, nitroGLYCERIN, ondansetron (ZOFRAN) IV, prochlorperazine, sodium chloride flush Medications Prior to Admission:  Prior to Admission medications   Medication Sig Start Date End Date Taking? Authorizing Provider  alendronate (FOSAMAX) 70 MG tablet Take 70 mg by mouth once a week.    Yes [provider]  aspirin EC 81 MG tablet Take 81 mg by mouth daily.    Yes [provider]  cetirizine (ZYRTEC) 10 MG tablet Take 10 mg by mouth daily.    Yes [provider]  colchicine 0.6 MG tablet Take 0.6-1.2 mg by mouth See admin instructions. Take 2 tablets (1.49m) by mouth as needed for acute gout flare -  take 1  additional tablet (0.67m) after 1 hour if needed for continued gout pain   Yes [provider]  ferrous sulfate 325 (65 FE) MG EC tablet Take 325 mg by mouth daily.  04/27/14 03/20/67 Yes [provider]  fluticasone (FLONASE) 50 MCG/ACT nasal spray Place 1-2 sprays into both nostrils daily as needed for allergies or rhinitis.    Yes [provider]  furosemide (LASIX) 40 MG tablet Take 40 mg by mouth daily.   Yes [provider]  gabapentin (NEURONTIN) 600 MG tablet Take 600 mg by mouth 3 (three) times daily.   Yes [provider]  glipiZIDE (GLUCOTROL) 10 MG tablet Take 10 mg by mouth 2 (two) times daily.  12/10/14  Yes [provider]  levothyroxine (SYNTHROID, LEVOTHROID) 150 MCG tablet Take 150 mcg by mouth daily before breakfast.  02/25/15  Yes [provider]  losartan (COZAAR) 50 MG tablet Take 50 mg by mouth daily.  09/30/18  Yes [provider]  metFORMIN (GLUCOPHAGE) 1000 MG tablet Take 500 mg by mouth 2 (two) times daily.    Yes [provider]  primidone (MYSOLINE) 50 MG tablet Take 50 mg by mouth 2 (two) times daily.    Yes [provider]  PROAIR HFA 108 (90 BASE) MCG/ACT inhaler Inhale 2 Inhalers into the lungs every 6 (six) hours as needed for wheezing or shortness of breath.  12/03/14  Yes [provider]  rOPINIRole (REQUIP) 0.5 MG tablet Take 0.5 mg by mouth at bedtime. 12/14/19  Yes [provider]  rosuvastatin (CRESTOR) 5 MG tablet Take 5 mg by mouth daily.    Yes [provider]  sertraline (ZOLOFT) 50 MG tablet Take 50 mg by mouth daily.    Yes [provider]  tiZANidine (ZANAFLEX) 2 MG tablet Take 2 mg by mouth 3 (three) times daily as needed for muscle spasms.    Yes [provider]  vitamin B-12 (CYANOCOBALAMIN) 1000 MCG tablet Take 1,000 mcg by mouth daily.   Yes [provider]  vitamin C (ASCORBIC ACID) 500 MG tablet Take 1,000 mg by  mouth daily.    Yes [provider]  Vitamin D, Ergocalciferol, (DRISDOL) 50000 UNITS CAPS capsule Take 50,000 Units by mouth every Tuesday.    Yes [provider]   Allergies  Allergen Reactions  . Nicotine Polacrilex Cough    Onset 04/29/2006.  .Marland KitchenNicotrol [Nicotine] Cough  . Codeine Sulfate Itching    Onset 11/24/1998. tingling   Review of Systems  Unable to perform ROS: Other    Physical Exam  Vital Signs: BP (!) 134/53 (BP Location: Right Arm)   Pulse 60   Temp 97.9 F (36.6 C) (Oral)   Resp 18   Ht _0  (1.626 m)   Wt 66.8 kg   SpO2 (!) 87%   BMI 25.28 kg/m  Pain Scale: 0-10 POSS *See Group Information*: 1-Acceptable,Awake and alert Pain Score: 8    SpO2: SpO2: (!) 87 % O2 Device:SpO2: (!) 87 % O2 Flow Rate: .O2 Flow Rate (L/min): 6 L/min  IO: Intake/output summary:   Intake/Output Summary (Last 24 hours) at 04/19/2020 1402 Last data filed at 04/19/2020 1240 Gross per 24 hour  Intake 0 ml  Output 125 ml  Net -125 ml    LBM: Last BM Date: 04/18/20 Baseline Weight: Weight: 72 kg Most recent weight: Weight: 66.8 kg     Palliative Assessment/Data:   FClinical research associateRow Most Recent  Value  Intake Tab   Referral Department Hospitalist  Unit at Time of Referral Other (Comment)  Palliative Care Primary Diagnosis Pulmonary  Date Notified 04/18/20  Palliative Care Type New Palliative care  Reason for referral Clarify Goals of Care  Date of Admission 04/03/20  Date first seen by Palliative Care 04/19/20  # of days Palliative referral response time 1 Day(s)  # of days IP prior to Palliative referral 15  Clinical Assessment   Palliative Performance Scale Score 30%  Pain Max last 24 hours Not able to report  Pain Min Last 24 hours Not able to report  Dyspnea Max Last 24 Hours Not able to report  Dyspnea Min Last 24 hours Not able to report  Psychosocial & Spiritual Assessment   Palliative Care Outcomes       Time In:  1140 Time Out: 1250 Time Total: 70 minutes  Greater than 50%  of this time was spent counseling and coordinating care related to the above assessment and plan.  Signed by: Drue Novel, NP   Please contact Palliative Medicine Team phone at [phone] for questions and concerns.  For individual provider: See Shea Evans

## 2020-04-19 NOTE — Progress Notes (Signed)
Pulmonary and Critical Care  Medicine          Date: 04/19/2020,   MRN# [id number] Martha [name] January 10, 1946     AdmissionWeight: 72 kg                 CurrentWeight: 66.8 kg  Referring physician: Dr. Si Raider    CHIEF COMPLAINT:   Acute on chronic hypoxemic respiratory failure   HISTORY OF PRESENT ILLNESS   74 year old female with history of diabetes COPD lifelong smoking hypothyroidism gout major depressive disorder, anxiety disorder and GERD, diverticulitis who came in with signs and symptoms of anasarca including peripheral edema and pulmonary edema.  Patient states is been worse than usual despite compliance with wrapping the lower extremities.  She also reported coughing and dyspnea.  Upon presentation to the emergency room she was found to be desaturating in the mid 80s and was placed on 4 L supplemental oxygen with some subsequent improvement of SPO2.  She was then treated with BiPAP and felt better.  She was initially admitted with acute exacerbation of CHF and cardiology was consulted for this.  She was also found to have proteinuria with CKD and had renal consultation placed. Pulmonary consultation placed for acute on chronic hypoxemic respiratory failure with failure to wean down oxygen.  Patient did have CT PE protocol done to rule out acute pulmonary venous embolism which was negative however did show bullous emphysema with overlying pulmonary edema as well as a right lower lobe consolidated infiltrate suggestive of pneumonia as well as right hemidiaphragm elevation due to hepatomegaly.  04/09/20- patient seen at bedside, remains encephalopathic.  Discussed with [facility] attending Dr Si Raider plan for transfer to SDU.     04/10/20- patient is improved.  She is more awake, she was able to speak with me despite BIPAP mask, she has normal 4/4 grip bilaterally and moves LE to verbal communication without encouragment. Mentation has significantly improved. She is flushed red on  examination.    04/11/20- patient is improved.  She is off BIPAP during my evaluation.  She is able to speak and reported right upper extermity pain, this may be related to K infusion. I dicussed this with RN who has already addressed this with IV team. ABG is in progress. Cardiology and nephrology following, UOP is low.   04/13/20- patient is improved.  She is speaking in full sentences. I spoke to her common law husband to review hospital course and care plan.  Patient will need BIPAP for home, ive discussed case with [facility] team and qualification for NIV is in process.  Spirometry is in process. 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  She smokes actively , I have provided smoking cessation counseling today and will be following up on outpatient for cessation therapy.  LE swelling is resolved and sensorium is close to baseline. There is no diarreah  04/14/20- patient resting in bed. Mildly confused. Breathing is diminished bilaterllly , on 8L HF bubbler. ABG tonight.   04/15/20- patient is resting in bed. Vitals are stable.  Seems patient may have been more confused after Haldol but not from ventilation as her ABG is without hypercapnia.  Nocturnal pulse oximetry is pending for home BIPAP  04/16/20- Daughter present during my evaluation.  She shares patient was smoking 2.5 packs daily and recently has been trying to stop. We discussed BIPAP for home.  Reviewed nocturnal pulse oximetry today with mild desaturation overnight. Patient is down to 5L/min [place] and improved.  04/17/20-  Patient is improved shes down to 5L/min.  She has thus far lost appx 35lbs since admission due to fluid overload. Her CO2 is improved and repeat CXR with interval improvement of interstitial infiltrates more notable on right side. Shes slow to improve due to advanced age.  04/18/20- patient is resting in bed in no apparent distress.  She is with weaning protocol on prednisone now on 62m PO.  She had a slight set  back in appears with increased O2 req to 7L/min from 5L/min.   12/21- Patient is in bed resting.  We discussed with husband and he shares patient is suffering, patient agrees and admits she is uncomfortable and further interview patient states she would prefer to have pain control and be left alone.   We have placed Palliatve care consultation.  I have decreased spO2 goal to 85% on 6L/min she is >85% currently.  I discussed plan with heather RT. Husband at bedside agrees to de-escalate to comfort measures, since he is with her for 32years but unmarried he techinically is not POA so we will discuss patients wishes with daughter.    PAST MEDICAL HISTORY   Past Medical History:  Diagnosis Date  . Abnormal LFTs (liver function tests)   . Anxiety   . Arthritis    knees, feet  . Asthma   . Chronic bronchitis ([place]   . Chronic constipation   . Chronic low back pain   . Cigarette smoker    Has cut back Smoking to 1 pack every other day  . COPD (chronic obstructive pulmonary disease) ([place]   . Depression   . Diabetic neuropathy ([place]   . Diverticulitis 2015   gi recommended repeat scope in 10 years  . Essential hypertension   . Fatty liver   . GERD (gastroesophageal reflux disease)   . Gout   . History of mammogram 05/28/2013  . Hyperlipidemia   . Hypothyroidism   . Insomnia   . Iron deficiency anemia   . Low serum vitamin D   . Personal history of tobacco use, presenting hazards to health 09/29/2015  . Plantar fasciitis   . RLS (restless legs syndrome)   . Shortness of breath dyspnea   . Thyroid cancer ([place] 2009  . Tremors of nervous system    "I think I have parkinson's disease"  . Type 2 diabetes mellitus ([place]   . Wears dentures    uppers     SURGICAL HISTORY   Past Surgical History:  Procedure Laterality Date  . CATARACT EXTRACTION W/PHACO Left 11/24/2015   Procedure: CATARACT EXTRACTION PHACO AND INTRAOCULAR LENS PLACEMENT (IOC);  Surgeon: WBirder Robson MD;   Location: [facility] ORS;  Service: Ophthalmology;  Laterality: Left;  [place]38.3 AP% 20.6 CDE 7.89 FLUID PACK LOT # [id number]  . CATARACT EXTRACTION W/PHACO Right 06/23/2019   Procedure: CATARACT EXTRACTION PHACO AND INTRAOCULAR LENS PLACEMENT ([place] RIGHT DIABETIC;  Surgeon: PBirder Robson MD;  [place]  Service: Ophthalmology;  Laterality: Right;  Diabetic  . COLONOSCOPY  2004, 2009, 2015   [place]  . POLYPECTOMY  2009  . THYROIDECTOMY  2009   states she was tx with radiactive iodine  . TUBAL LIGATION    . UPPER GASTROINTESTINAL ENDOSCOPY       FAMILY HISTORY   Family History  Problem Relation Age of Onset  . Diabetes Mellitus II Father   . Lung disease Father   . Diabetes Mellitus II Mother   . Arthritis Mother   .  Thyroid disease Other   . Asthma Sister   . Emphysema Sister   . Hypertension Sister   . Breast cancer Paternal Aunt      SOCIAL HISTORY   Social History   Tobacco Use  . Smoking status: Current Every Day Smoker    Packs/day: 1.00    Years: 39.50    Pack years: 39.50    Types: Cigarettes  . Smokeless tobacco: Never Used  . Tobacco comment: curently smokes 12 cigerattes a day. I'm trying to cut back.  Vaping Use  . Vaping Use: Never used  Substance Use Topics  . Alcohol use: No    Alcohol/week: 0.0 standard drinks  . Drug use: No     MEDICATIONS    Home Medication:    Current Medication:  Current Facility-Administered Medications:  .  0.9 %  sodium chloride infusion, 250 mL, Intravenous, PRN, Ivor Costa, MD, Last Rate: 5 mL/hr at 04/15/20 1256, 250 mL at 04/15/20 1256 .  acetaminophen (TYLENOL) suppository 650 mg, 650 mg, Rectal, Q6H PRN, Lang Snow, NP, 650 mg at 04/08/20 2234 .  acetaminophen (TYLENOL) tablet 650 mg, 650 mg, Oral, Q6H PRN, Lorella Nimrod, MD, 650 mg at 04/19/20 1226 .  albuterol (PROVENTIL) (2.5 MG/3ML) 0.083% nebulizer solution 2.5 mg, 2.5 mg, Nebulization, Q4H PRN, Ivor Costa, MD, 2.5 mg at  04/18/20 1940 .  amLODipine (NORVASC) tablet 10 mg, 10 mg, Oral, Daily, Wouk, Ailene Rud, MD, 10 mg at 04/19/20 0930 .  ascorbic acid (VITAMIN C) tablet 1,000 mg, 1,000 mg, Oral, Daily, Ivor Costa, MD, 1,000 mg at 04/18/20 1106 .  calcium carbonate (TUMS - dosed in mg elemental calcium) chewable tablet 200 mg of elemental calcium, 1 tablet, Oral, Q8H PRN, Nicole Kindred A, DO, 200 mg of elemental calcium at 04/14/20 1824 .  carvedilol (COREG) tablet 12.5 mg, 12.5 mg, Oral, BID WC, Teodoro Spray, MD, 12.5 mg at 04/19/20 0930 .  chlorhexidine (PERIDEX) 0.12 % solution 15 mL, 15 mL, Mouth Rinse, BID, Wouk, Ailene Rud, MD, 15 mL at 04/18/20 2204 .  Chlorhexidine Gluconate Cloth 2 % PADS 6 each, 6 each, Topical, Daily, Wouk, Ailene Rud, MD, 6 each at 04/19/20 [phone] .  dextromethorphan-guaiFENesin (MUCINEX DM) 30-600 MG per 12 hr tablet 1 tablet, 1 tablet, Oral, BID PRN, Ivor Costa, MD, 1 tablet at 04/18/20 1113 .  enoxaparin (LOVENOX) injection 40 mg, 40 mg, Subcutaneous, Q24H, Nicole Kindred A, DO, 40 mg at 04/18/20 2205 .  fluticasone (FLONASE) 50 MCG/ACT nasal spray 1 spray, 1 spray, Each Nare, BID, Sharion Settler, NP, 1 spray at 04/19/20 [phone] .  fluticasone (FLONASE) 50 MCG/ACT nasal spray 1-2 spray, 1-2 spray, Each Nare, Daily PRN, Ivor Costa, MD .  insulin aspart (novoLOG) injection 0-5 Units, 0-5 Units, Subcutaneous, QHS, Ivor Costa, MD, 2 Units at 04/18/20 2204 .  insulin aspart (novoLOG) injection 0-9 Units, 0-9 Units, Subcutaneous, TID WC, Ivor Costa, MD, 2 Units at 04/19/20 1225 .  insulin glargine (LANTUS) injection 20 Units, 20 Units, Subcutaneous, Daily, Wouk, Ailene Rud, MD, 20 Units at 04/18/20 1109 .  ipratropium-albuterol (DUONEB) 0.5-2.5 (3) MG/3ML nebulizer solution 3 mL, 3 mL, Nebulization, TID, Ivor Costa, MD, 3 mL at 04/19/20 0740 .  labetalol (NORMODYNE) injection 20 mg, 20 mg, Intravenous, Q2H PRN, Wouk, Ailene Rud, MD, 20 mg at 04/15/20 1252 .  levothyroxine  (SYNTHROID) tablet 150 mcg, 150 mcg, Oral, Q0600, Ivor Costa, MD, 150 mcg at 04/16/20 0659 .  losartan (COZAAR) tablet 100 mg, 100 mg, Oral,  Daily, Wouk, Ailene Rud, MD, 100 mg at 04/19/20 1045 .  MEDLINE mouth rinse, 15 mL, Mouth Rinse, q12n4p, Wouk, Ailene Rud, MD, 15 mL at 04/19/20 1227 .  multivitamin with minerals tablet 1 tablet, 1 tablet, Oral, Daily, Nicole Kindred A, DO, 1 tablet at 04/18/20 1106 .  nitroGLYCERIN (NITROSTAT) SL tablet 0.4 mg, 0.4 mg, Sublingual, Q5 min PRN, Wouk, Ailene Rud, MD, 0.4 mg at 04/08/20 0659 .  ondansetron (ZOFRAN) injection 4 mg, 4 mg, Intravenous, Q8H PRN, Ivor Costa, MD, 4 mg at 04/15/20 1100 .  pantoprazole (PROTONIX) EC tablet 40 mg, 40 mg, Oral, Daily, Nicole Kindred A, DO, 40 mg at 04/18/20 1106 .  predniSONE (DELTASONE) tablet 20 mg, 20 mg, Oral, Q breakfast, Terryn Rosenkranz, MD, 20 mg at 04/19/20 0931 .  primidone (MYSOLINE) tablet 50 mg, 50 mg, Oral, BID, Nicole Kindred A, DO, 50 mg at 04/19/20 0931 .  prochlorperazine (COMPAZINE) injection 10 mg, 10 mg, Intravenous, Q6H PRN, Nicole Kindred A, DO, 10 mg at 04/15/20 1729 .  rosuvastatin (CRESTOR) tablet 5 mg, 5 mg, Oral, Daily, Ivor Costa, MD, 5 mg at 04/19/20 [phone] .  sodium chloride flush (NS) 0.9 % injection 3 mL, 3 mL, Intravenous, Q12H, Ivor Costa, MD, 3 mL at 04/19/20 0918 .  sodium chloride flush (NS) 0.9 % injection 3 mL, 3 mL, Intravenous, PRN, Ivor Costa, MD .  vitamin B-12 (CYANOCOBALAMIN) tablet 1,000 mcg, 1,000 mcg, Oral, Daily, Ivor Costa, MD, 1,000 mcg at 04/18/20 1107    ALLERGIES   Nicotine polacrilex, Nicotrol [nicotine], and Codeine sulfate     REVIEW OF SYSTEMS    Review of Systems:  Unable to obtain due to encephalopathy   VS: BP (!) 134/53 (BP Location: Right Arm)   Pulse 60   Temp 97.9 F (36.6 C) (Oral)   Resp 18   Ht '5\' 4"'  (1.626 m)   Wt 66.8 kg   SpO2 92%   BMI 25.28 kg/m      PHYSICAL EXAM    GENERAL:NAD, no fevers, chills, no weakness no  fatigue HEAD: Normocephalic, atraumatic.  EYES: Pupils equal, round, reactive to light. Extraocular muscles intact. No scleral icterus.  MOUTH: dry mucosal membrane. Dentition intact. No abscess noted. On BIPAP EAR, NOSE, THROAT: Clear without exudates. No external lesions.  NECK: Supple. No thyromegaly. No nodules. No JVD.  PULMONARY: mild rhonchi bilaterally  CARDIOVASCULAR: S1 and S2. Regular rate and rhythm. No murmurs, rubs, or gallops. No edema. Pedal pulses 2+ bilaterally.  GASTROINTESTINAL: Soft, nontender, nondistended. No masses. Positive bowel sounds. No hepatosplenomegaly.  MUSCULOSKELETAL: No swelling, clubbing, +edema 2++.  NEUROLOGIC: GCS 10  SKIN: No ulceration, lesions, rashes, or cyanosis. Skin warm and dry. Turgor intact.       IMAGING    DG Chest 1 View  Result Date: 04/09/2020 CLINICAL DATA:  Acute respiratory failure EXAM: CHEST  1 VIEW COMPARISON:  04/03/2016 FINDINGS: Normal cardiac silhouette. Interval increase in interstitial edema pattern. No focal consolidation. No pneumothorax. IMPRESSION: Worsening interstitial edema. Electronically Signed   By: Suzy Bouchard M.D.   On: 04/09/2020 06:31   DG Chest 2 View  Result Date: 04/03/2020 CLINICAL DATA:  Hypoxia. Leg edema and shortness of breath. Pt daughter states that pt is supposed to be getting a renal bx but they are not able to do it because of the edema in pts legs. Pt states that she does feel short of breath. Pt does not wear oxygen at home. Pt placed on O2 in triage due to  sats being in the 80s. Hx of asthma, chronic bronchitis, COPD, thyroid cancer- 2009, DM, current smoker. EXAM: CHEST - 2 VIEW COMPARISON:  01/04/2018 FINDINGS: Cardiac silhouette is borderline enlarged. Stable aortic atherosclerotic calcifications. No mediastinal or hilar masses. No evidence of adenopathy. Lungs demonstrate diffuse bilateral irregular interstitial thickening which has increased compared to the prior study. There is also  linear/reticular scarring in the left lung base, which is stable. No lung consolidation. No pleural effusion and no pneumothorax. Skeletal structures are demineralized, but grossly intact. IMPRESSION: 1. Bilateral irregular interstitial thickening, increased compared to the prior study, along with borderline cardiomegaly. Suspect mild congestive heart failure. Consider diffuse interstitial infection or inflammation in the proper clinical setting. No evidence of lobar pneumonia. Electronically Signed   By: Lajean Manes M.D.   On: 04/03/2020 13:06   CT CHEST W CONTRAST  Result Date: 04/06/2020 CLINICAL DATA:  Hemoptysis. EXAM: CT CHEST WITH CONTRAST TECHNIQUE: Multidetector CT imaging of the chest was performed during intravenous contrast administration. CONTRAST:  33m OMNIPAQUE IOHEXOL 300 MG/ML  SOLN COMPARISON:  January 06, 2019 FINDINGS: Cardiovascular: There is marked severity calcification of the aortic arch. There is mild cardiomegaly. No pericardial effusion. Mediastinum/Nodes: There is mild pretracheal and right hilar lymphadenopathy. Thyroid gland, trachea, and esophagus demonstrate no significant findings. Lungs/Pleura: Mild-to-moderate severity emphysematous lung disease is seen involving the bilateral upper lobes with stable bullous disease is seen along the medial aspect of the left apex. Mild to moderate severity areas of atelectasis and/or infiltrate are seen within the posterior aspects of the bilateral upper lobes and left lung base. Moderate to marked severity posterior right basilar atelectasis and/or infiltrate is noted. A very small right pleural effusion is seen. No pneumothorax is identified. Upper Abdomen: There is stable hepatosplenomegaly with stable, moderate severity diffuse bilateral adrenal gland enlargement. Musculoskeletal: A chronic lateral eighth left rib fracture is seen. A chronic compression fracture deformity of the T12 vertebral body is noted. IMPRESSION: 1.  Mild-to-moderate severity posterior bilateral upper lobe and left basilar atelectasis and/or infiltrate. 2. Moderate to marked severity posterior right basilar atelectasis and/or infiltrate. 3. Very small right pleural effusion. 4. Mild cardiomegaly. 5. Stable emphysematous lung disease. 6. Chronic lateral eighth left rib fracture. 7. Chronic compression fracture deformity of the T12 vertebral body. 8. Aortic atherosclerosis. Aortic Atherosclerosis (ICD10-I70.0) and Emphysema (ICD10-J43.9). Electronically Signed   By: TVirgina NorfolkM.D.   On: 04/06/2020 15:33   CT ANGIO CHEST PE W OR WO CONTRAST  Result Date: 04/07/2020 CLINICAL DATA:  Shortness of breath EXAM: CT ANGIOGRAPHY CHEST WITH CONTRAST TECHNIQUE: Multidetector CT imaging of the chest was performed using the standard protocol during bolus administration of intravenous contrast. Multiplanar CT image reconstructions and MIPs were obtained to evaluate the vascular anatomy. CONTRAST:  756mOMNIPAQUE IOHEXOL 350 MG/ML SOLN COMPARISON:  April 06, 2020 FINDINGS: Cardiovascular: There is a optimal opacification of the pulmonary arteries. There is no central,segmental, or subsegmental filling defects within the pulmonary arteries. There is unchanged cardiomegaly. Mitral valve calcifications and coronary artery calcifications are seen. No pericardial effusion or thickening. No evidence right heart strain. There is normal three-vessel brachiocephalic anatomy without proximal stenosis. Scattered aortic atherosclerosis is noted. Mediastinum/Nodes: No hilar, mediastinal, or axillary adenopathy. Thyroid gland, trachea, and esophagus demonstrate no significant findings. Lungs/Pleura: Patchy airspace opacity seen at the left upper lung and posterior right lung base with air bronchograms. There are trace bilateral pleural effusions present. Centrilobular and paraseptal emphysematous changes seen at both lung apices. Upper Abdomen: No acute  abnormalities present in  the visualized portions of the upper abdomen. Musculoskeletal: No chest wall abnormality. No acute or significant osseous findings. There is a chronic anterior wedge compression deformity of the T12 vertebral body with 50% loss in vertebral body height. Review of the MIP images confirms the above findings. IMPRESSION: No central, segmental, or subsegmental pulmonary embolism. Unchanged multifocal patchy airspace opacities within both lungs which could be due to multifocal pneumonia Trace bilateral pleural effusions, right greater than left. Aortic Atherosclerosis (ICD10-I70.0). Electronically Signed   By: Prudencio Pair M.D.   On: 04/07/2020 19:43   CT CHEST ABDOMEN PELVIS W CONTRAST  Result Date: 04/12/2020 CLINICAL DATA:  Shortness of breath, abdominal pain, and bilateral leg swelling. EXAM: CT CHEST, ABDOMEN, AND PELVIS WITH CONTRAST TECHNIQUE: Multidetector CT imaging of the chest, abdomen and pelvis was performed following the standard protocol during bolus administration of intravenous contrast. CONTRAST:  114m OMNIPAQUE IOHEXOL 300 MG/ML  SOLN COMPARISON:  Right upper quadrant ultrasound from yesterday. CTA chest dated April 07, 2020. FINDINGS: CT CHEST FINDINGS Cardiovascular: Unchanged borderline cardiomegaly. No pericardial effusion. No thoracic aortic aneurysm or dissection. Coronary, aortic arch, and branch vessel atherosclerotic vascular disease. 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 No central pulmonary embolism. Mediastinum/Nodes: Unchanged mildly enlarged precarinal lymph node measuring 12 mm in short axis, stable since September 2020, likely reactive. No enlarged hilar or axillary lymph nodes. Prior thyroidectomy. Prominent circumferential wall thickening of the proximal and mid esophagus, slightly worsened since the prior study. Lungs/Pleura: Slightly increased small right pleural effusion. New  trace left pleural effusion. Prominent atelectasis within the right lower lobe with patchy areas of non enhancement and air bronchograms consistent with underlying pneumonia, slightly worsened when compared to prior study. Increased patchy consolidation in both posterior upper lobes. Minimal subsegmental atelectasis in the left lower lobe. Moderate centrilobular and mild paraseptal emphysema again noted with bullous changes in the medial left lung apex. No pneumothorax. 7 mm subpleural nodule in the left upper lobe (series 4, image 39), similar to most recent chest CTs, but slightly more conspicuous when compared to CT chest from September 2020. Musculoskeletal: No acute or significant osseous findings. Unchanged chronic T12 compression deformity. Old left eighth rib fracture. CT ABDOMEN PELVIS FINDINGS Hepatobiliary: Slightly lobular liver contour. No focal liver abnormality is seen. No gallstones, gallbladder wall thickening, or biliary dilatation. Pancreas: Unremarkable. No pancreatic ductal dilatation or surrounding inflammatory changes. Spleen: Normal in size without focal abnormality. Adrenals/Urinary Tract: Chronic bilateral adrenal hypertrophy. No renal calculi, focal lesion, or hydronephrosis. The bladder is unremarkable. Stomach/Bowel: Stomach is within normal limits. Appendix appears normal. No evidence of bowel wall thickening, distention, or inflammatory changes. Prominent sigmoid colonic diverticulosis. Vascular/Lymphatic: Aortic atherosclerosis. 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 No enlarged abdominal or pelvic lymph nodes. Reproductive: Uterus and bilateral adnexa are unremarkable. Other: Scattered trace ascites. No pneumoperitoneum. Small fat containing paraumbilical hernias. Musculoskeletal: No acute or significant osseous findings. IMPRESSION: CT chest: 1. Mildly worsened multifocal pneumonia. 2. Slightly increased small right pleural effusion. New trace left pleural  effusion. 3. 7 mm subpleural nodule in the left upper lobe, similar to most recent chest CTs, but slightly more conspicuous when compared to CT chest from September 2020. While this may be infectious or inflammatory, attention on follow-up imaging is recommended. 4. Prominent circumferential wall thickening of the proximal and mid esophagus, slightly worsened since the prior study, concerning for esophagitis. 5. Severe stenosis/probable occlusion  of the proximal left subclavian artery with reconstitution beyond the left vertebral artery. 6. Aortic Atherosclerosis (ICD10-I70.0) and Emphysema (ICD10-J43.9). CT abdomen and pelvis: 1. No acute intra-abdominal process. 2. Slightly lobular liver contour with scattered trace ascites, suggestive of cirrhosis. 3. 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 Electronically Signed   By: Titus Dubin M.D.   On: 04/12/2020 13:51   US Venous Img Lower Bilateral (DVT)  Result Date: 04/03/2020 CLINICAL DATA:  Lower leg cellulitis EXAM: BILATERAL LOWER EXTREMITY VENOUS DOPPLER ULTRASOUND TECHNIQUE: Gray-scale sonography with graded compression, as well as color Doppler and duplex ultrasound were performed to evaluate the lower extremity deep venous systems from the level of the common femoral vein and including the common femoral, femoral, profunda femoral, popliteal and calf veins including the posterior tibial, peroneal and gastrocnemius veins when visible. The superficial great saphenous vein was also interrogated. Spectral Doppler was utilized to evaluate flow at rest and with distal augmentation maneuvers in the common femoral, femoral and popliteal veins. COMPARISON:  None. FINDINGS: RIGHT LOWER EXTREMITY Common Femoral Vein: No evidence of thrombus. Normal compressibility, respiratory phasicity and response to augmentation. Saphenofemoral Junction: No evidence of thrombus. Normal compressibility and flow on color Doppler imaging. Profunda Femoral Vein: No  evidence of thrombus. Normal compressibility and flow on color Doppler imaging. Femoral Vein: No evidence of thrombus. Normal compressibility, respiratory phasicity and response to augmentation. Popliteal Vein: Not well visualized due to overlying edema. Calf Veins: Not well visualized due to overlying edema. Superficial Great Saphenous Vein: No evidence of thrombus. Normal compressibility. Venous Reflux:  None. Other Findings:  None. LEFT LOWER EXTREMITY Common Femoral Vein: No evidence of thrombus. Normal compressibility, respiratory phasicity and response to augmentation. Saphenofemoral Junction: No evidence of thrombus. Normal compressibility and flow on color Doppler imaging. Profunda Femoral Vein: No evidence of thrombus. Normal compressibility and flow on color Doppler imaging. Femoral Vein: No evidence of thrombus. Normal compressibility, respiratory phasicity and response to augmentation. Popliteal Vein: Not well visualized due to overlying edema. Calf Veins: Not well visualized due to overlying edema. Superficial Great Saphenous Vein: No evidence of thrombus. Normal compressibility. Venous Reflux:  None. Other Findings:  None. IMPRESSION: Somewhat limited exam due to peripheral edema although no central deep venous thrombosis is noted. Electronically Signed   By: Inez Catalina M.D.   On: 04/03/2020 15:58   DG Chest Port 1 View  Result Date: 04/17/2020 CLINICAL DATA:  Respiratory failure EXAM: PORTABLE CHEST 1 VIEW COMPARISON:  April 15, 2020 FINDINGS: The cardiomediastinal silhouette is unchanged in contour.Atherosclerotic calcifications of the aorta. Small RIGHT pleural effusion. No pneumothorax. Diffuse interstitial prominence. Decreased LEFT lower lung atelectasis. Persistent heterogeneous opacity of the RIGHT lung base. Visualized abdomen is unremarkable. Multilevel degenerative changes of the thoracic spine. Remote LEFT-sided rib fracture. IMPRESSION: 1. Decreased LEFT lower lung atelectasis.  Persistent heterogeneous opacity of the RIGHT lung base with diffuse interstitial prominence. 2.  Unchanged small RIGHT pleural effusion. Electronically Signed   By: Valentino Saxon MD   On: 04/17/2020 14:51   DG Chest Port 1 View  Result Date: 04/15/2020 CLINICAL DATA:  Shortness of breath with soft tissue edema EXAM: PORTABLE CHEST 1 VIEW COMPARISON:  Chest radiograph April 09, 2020 and chest CT April 12, 2020 FINDINGS: There is diffuse interstitial prominence, likely representing a combination of fibrosis and interstitial pulmonary edema. There is atelectatic change in the left mid lung. There are small pleural effusions bilaterally. No consolidation. There is cardiomegaly with a degree of pulmonary venous  hypertension. There is aortic atherosclerosis. No appreciable adenopathy. Foci of calcification noted in each carotid artery. Calcification noted in the lateral left shoulder, stable. IMPRESSION: Suspect combination of interstitial pulmonary edema and underlying fibrosis. No consolidation. Small pleural effusions bilaterally. Cardiomegaly with pulmonary vascular congestion. Suspect a degree of underlying congestive heart failure. Aortic Atherosclerosis (ICD10-I70.0). Electronically Signed   By: Lowella Grip III M.D.   On: 04/15/2020 09:01   DG Swallowing Func-Speech Pathology  Result Date: 04/19/2020 Objective Swallowing Evaluation: Type of Study: MBS-Modified Barium Swallow Study  Patient Details Name: Martha [name] MRN: [id number] Date of Birth: Sep 09, 1945 Today's Date: 04/19/2020 Time: SLP Start Time (ACUTE ONLY): 0805 -SLP Stop Time (ACUTE ONLY): 0825 SLP Time Calculation (min) (ACUTE ONLY): 20 min Past Medical History: Past Medical History: Diagnosis Date . Abnormal LFTs (liver function tests)  . Anxiety  . Arthritis   knees, feet . Asthma  . Chronic bronchitis ([place])  . Chronic constipation  . Chronic low back pain  . Cigarette smoker   Has cut back Smoking to 1 pack every other day  . COPD (chronic obstructive pulmonary disease) ([place])  . Depression  . Diabetic neuropathy ([place])  . Diverticulitis 2015  gi recommended repeat scope in 10 years . Essential hypertension  . Fatty liver  . GERD (gastroesophageal reflux disease)  . Gout  . History of mammogram 05/28/2013 . Hyperlipidemia  . Hypothyroidism  . Insomnia  . Iron deficiency anemia  . Low serum vitamin D  . Personal history of tobacco use, presenting hazards to health 09/29/2015 . Plantar fasciitis  . RLS (restless legs syndrome)  . Shortness of breath dyspnea  . Thyroid cancer ([place]) 2009 . Tremors of nervous system   "I think I have parkinson's disease" . Type 2 diabetes mellitus ([place])  . Wears dentures   uppers Past Surgical History: Past Surgical History: Procedure Laterality Date . CATARACT EXTRACTION W/PHACO Left 11/24/2015  Procedure: CATARACT EXTRACTION PHACO AND INTRAOCULAR LENS PLACEMENT (IOC);  Surgeon: Birder Robson, MD;  Location: [facility] ORS;  Service: Ophthalmology;  Laterality: Left;  [place] 38.3 AP% 20.6 CDE 7.89 FLUID PACK LOT # [id number] H . CATARACT EXTRACTION W/PHACO Right 06/23/2019  Procedure: CATARACT EXTRACTION PHACO AND INTRAOCULAR LENS PLACEMENT ([place]) RIGHT DIABETIC;  Surgeon: Birder Robson, MD;  Location: [facility];  Service: Ophthalmology;  Laterality: Right;  Diabetic . COLONOSCOPY  2004, 2009, 2015  [place] . POLYPECTOMY  2009 . THYROIDECTOMY  2009  states she was tx with radiactive iodine . TUBAL LIGATION   . UPPER GASTROINTESTINAL ENDOSCOPY   HPI: Pt  is a 74 y.o. female with a medical history significant for Multiple issues including GERD, restless leg syndrome, essential tremors, HTN, DM, COPD, tobacco use, gout, depression/anxiety and chronic lower extremity edema/cellulitis.  Patient presented to [facility] ED 04/03/20 for leg edema and worsening shortness of breath and was found with O2 saturations in the 80's. She was found to have a BNP of 1113 and imaging showed mild cardiomegaly and pulmonary edema. Her  lab work also showed worsening proteinuria. She was admitted for further work up.  per Pulmonary, pt has dx of Acute on chronic hypoxemic respiratory failure; BiPAP at night, on increased [employer] O2 support during the day.  Pt has some degree of Cognitive decline - decreased attention and follow through w/ tasks w/out cues; often calling out Loudly.  CXR: Unchanged multifocal patchy airspace opacities within both lungs  which could be due to multifocal pneumonia.  Subjective: Bloody sputum on pt's chin and  inside her mask when she arrived Assessment / Plan / Recommendation CHL IP CLINICAL IMPRESSIONS 04/19/2020 Clinical Impression Patient presents with moderately severe oropharyngeal dysphagia, with trace silent aspiration of thin and nectar thick liquids and laryngeal penetration of honey thick liquids and puree. When pt arrived to fluoro, she had coughed bloody-tinged secretions into her mask. Oral stage is characterized by sluggish, discoordinated bolus formation, decreased bolus cohesion with premature spillage to the pharynx. Swallow initiation is delayed to the level of the pyriform sinuses, resulting in delayed laryngeal closure and penetration of all consistencies before the swallow, aspiration of thin and nectar thick liquids during the swallow which were not sensed by the patient. Penetration/aspiration appears to occur due to impaired timing/sensation vs pharyngeal weakness. She is only intermittently able to follow commands for throat clear/cough, which was marginally effective at clearing shallower penetration. There is intermittent mild residue in the oral cavity, with spillover occurring to the valleculae between frames. A chin tuck was attempted, with max cues required; this did not prevent penetration or aspiration (with nectar). Smaller (1/2-1 tsp) size boluses of thicker consistencies were better contained in the valleculae, reducing potential for aspiration from premature spill.  Recommend dys 1  (puree) and honey thick liquids by teaspoon, with assist for self-feeding and cuing for throat clearing intermittently to eject laryngeal penetration. Patient is at risk for aspiration regardless of consistency, although there may be some potential for improvement if her mental status improves. SLP Visit Diagnosis Dysphagia, oropharyngeal phase (R13.12) Attention and concentration deficit following -- Frontal lobe and executive function deficit following -- Impact on safety and function Moderate aspiration risk;Severe aspiration risk   CHL IP TREATMENT RECOMMENDATION 04/19/2020 Treatment Recommendations Therapy as outlined in treatment plan below   Prognosis 04/19/2020 Prognosis for Safe Diet Advancement Fair Barriers to Reach Goals Time post onset;Severity of deficits;Behavior Barriers/Prognosis Comment -- CHL IP DIET RECOMMENDATION 04/19/2020 SLP Diet Recommendations Dysphagia 1 (Puree) solids;Honey thick liquids Liquid Administration via Spoon Medication Administration Crushed with puree Compensations Minimize environmental distractions;Slow rate;Small sips/bites;Lingual sweep for clearance of pocketing;Clear throat intermittently Postural Changes Seated upright at 90 degrees   CHL IP OTHER RECOMMENDATIONS 04/19/2020 Recommended Consults -- Oral Care Recommendations Oral care QID Other Recommendations Order thickener from pharmacy;Prohibited food (jello, ice cream, thin soups)   CHL IP FOLLOW UP RECOMMENDATIONS 04/19/2020 Follow up Recommendations Other (comment)   CHL IP FREQUENCY AND DURATION 04/19/2020 Speech Therapy Frequency (ACUTE ONLY) min 2x/week Treatment Duration 1 week      CHL IP ORAL PHASE 04/19/2020 Oral Phase Impaired Oral - Pudding Teaspoon -- Oral - Pudding Cup -- Oral - Honey Teaspoon Lingual/palatal residue;Decreased bolus cohesion;Premature spillage Oral - Honey Cup NT Oral - Nectar Teaspoon Lingual/palatal residue;Decreased bolus cohesion;Premature spillage Oral - Nectar Cup Lingual/palatal  residue;Decreased bolus cohesion;Premature spillage Oral - Nectar Straw NT Oral - Thin Teaspoon Lingual/palatal residue;Decreased bolus cohesion;Premature spillage Oral - Thin Cup Lingual/palatal residue;Decreased bolus cohesion;Premature spillage Oral - Thin Straw NT Oral - Puree Lingual/palatal residue;Decreased bolus cohesion;Premature spillage Oral - Mech Soft -- Oral - Regular -- Oral - Multi-Consistency -- Oral - Pill -- Oral Phase - Comment --  CHL IP PHARYNGEAL PHASE 04/19/2020 Pharyngeal Phase Impaired Pharyngeal- Pudding Teaspoon -- Pharyngeal -- Pharyngeal- Pudding Cup -- Pharyngeal -- Pharyngeal- Honey Teaspoon Delayed swallow initiation-pyriform sinuses;Penetration/Aspiration before swallow Pharyngeal Material enters airway, remains ABOVE vocal cords then ejected out;Material enters airway, remains ABOVE vocal cords and not ejected out Pharyngeal- Honey Cup NT Pharyngeal -- Pharyngeal- Nectar Teaspoon Delayed swallow initiation-pyriform sinuses;Penetration/Aspiration  before swallow;Penetration/Aspiration during swallow;Trace aspiration;Compensatory strategies attempted (with notebox) Pharyngeal Material enters airway, remains ABOVE vocal cords and not ejected out;Material enters airway, passes BELOW cords without attempt by patient to eject out (silent aspiration) Pharyngeal- Nectar Cup Delayed swallow initiation-pyriform sinuses;Penetration/Aspiration before swallow;Penetration/Aspiration during swallow;Trace aspiration Pharyngeal Material enters airway, remains ABOVE vocal cords and not ejected out;Material enters airway, passes BELOW cords without attempt by patient to eject out (silent aspiration) Pharyngeal- Nectar Straw NT Pharyngeal -- Pharyngeal- Thin Teaspoon Delayed swallow initiation-pyriform sinuses;Penetration/Aspiration before swallow Pharyngeal Material enters airway, remains ABOVE vocal cords and not ejected out Pharyngeal- Thin Cup Delayed swallow initiation-pyriform  sinuses;Penetration/Aspiration before swallow;Penetration/Aspiration during swallow;Trace aspiration Pharyngeal Material enters airway, remains ABOVE vocal cords and not ejected out;Material enters airway, passes BELOW cords without attempt by patient to eject out (silent aspiration) Pharyngeal- Thin Straw NT Pharyngeal -- Pharyngeal- Puree Delayed swallow initiation-pyriform sinuses;Penetration/Aspiration before swallow Pharyngeal Material enters airway, remains ABOVE vocal cords then ejected out;Material enters airway, remains ABOVE vocal cords and not ejected out Pharyngeal- Mechanical Soft -- Pharyngeal -- Pharyngeal- Regular -- Pharyngeal -- Pharyngeal- Multi-consistency -- Pharyngeal -- Pharyngeal- Pill -- Pharyngeal -- Pharyngeal Comment --  CHL IP CERVICAL ESOPHAGEAL PHASE 04/19/2020 Cervical Esophageal Phase WFL Pudding Teaspoon -- Pudding Cup -- Honey Teaspoon -- Honey Cup -- Nectar Teaspoon -- Nectar Cup -- Nectar Straw -- Thin Teaspoon -- Thin Cup -- Thin Straw -- Puree -- Mechanical Soft -- Regular -- Multi-consistency -- Pill -- Cervical Esophageal Comment -- Deneise Lever, MS, CCC-SLP Speech-Language Pathologist STEFANIA GOULART 04/19/2020, 12:17 PM              ECHOCARDIOGRAM COMPLETE  Result Date: 04/04/2020    ECHOCARDIOGRAM REPORT   Patient Name:   Martha [name] Date of Exam: 04/04/2020 Medical Rec #:  [id number]      Height:       64.0 in Accession #:    [id number]     Weight:       181.8 lb Date of Birth:  12-13-1945     BSA:          1.879 m Patient Age:    31 years       BP:           171/78 mmHg Patient Gender: F              HR:           98 bpm. Exam Location:  [facility] Procedure: 2D Echo, Color Doppler, Cardiac Doppler and Intracardiac            Opacification Agent Indications:     I50.31 CHF-Acute Diastolic  History:         Patient has no prior history of Echocardiogram examinations.                  COPD, Signs/Symptoms:Shortness of Breath; Risk Factors:Diabetes                  and  Current Smoker.  Sonographer:     Charmayne Sheer RDCS (AE) Referring Phys:  Baker Janus Soledad Gerlach NIU Diagnosing Phys: Bartholome Bill MD  Sonographer Comments: Technically difficult study due to poor echo windows. Image acquisition challenging due to patient body habitus and Image acquisition challenging due to COPD. IMPRESSIONS  1. Left ventricular ejection fraction, by estimation, is 55 to 60%. The left ventricle has normal function. The left ventricle has no regional wall motion abnormalities. Left ventricular diastolic parameters are consistent with Grade I diastolic dysfunction (  impaired relaxation).  2. Right ventricular systolic function is normal. The right ventricular size is mildly enlarged.  3. Left atrial size was mildly dilated.  4. Right atrial size was mildly dilated.  5. The mitral valve was not well visualized. Trivial mitral valve regurgitation.  6. The aortic valve was not well visualized. Aortic valve regurgitation is trivial. FINDINGS  Left Ventricle: Left ventricular ejection fraction, by estimation, is 55 to 60%. The left ventricle has normal function. The left ventricle has no regional wall motion abnormalities. Definity contrast agent was given IV to delineate the left ventricular  endocardial borders. The left ventricular internal cavity size was normal in size. There is borderline left ventricular hypertrophy. Left ventricular diastolic parameters are consistent with Grade I diastolic dysfunction (impaired relaxation). Right Ventricle: The right ventricular size is mildly enlarged. No increase in right ventricular wall thickness. Right ventricular systolic function is normal. Left Atrium: Left atrial size was mildly dilated. Right Atrium: Right atrial size was mildly dilated. Pericardium: There is no evidence of pericardial effusion. Mitral Valve: The mitral valve was not well visualized. Trivial mitral valve regurgitation. MV peak gradient, 7.4 mmHg. The mean mitral valve gradient is 3.0 mmHg. Tricuspid  Valve: The tricuspid valve is not well visualized. Tricuspid valve regurgitation is trivial. Aortic Valve: The aortic valve was not well visualized. Aortic valve regurgitation is trivial. Aortic valve mean gradient measures 3.0 mmHg. Aortic valve peak gradient measures 6.8 mmHg. Aortic valve area, by VTI measures 2.46 cm. Pulmonic Valve: The pulmonic valve was not well visualized. Pulmonic valve regurgitation is not visualized. Aorta: The aortic root was not well visualized. IAS/Shunts: The interatrial septum was not assessed.  LEFT VENTRICLE PLAX 2D LVIDd:         4.15 cm     Diastology LVIDs:         3.64 cm     LV e' medial:    5.98 cm/s LV PW:         1.43 cm     LV E/e' medial:  18.3 LV IVS:        1.12 cm     LV e' lateral:   6.53 cm/s LVOT diam:     2.00 cm     LV E/e' lateral: 16.8 LV SV:         55 LV SV Index:   29 LVOT Area:     3.14 cm  LV Volumes (MOD) LV vol d, MOD A2C: 55.5 ml LV vol d, MOD A4C: 63.8 ml LV vol s, MOD A2C: 34.3 ml LV vol s, MOD A4C: 23.4 ml LV SV MOD A2C:     21.2 ml LV SV MOD A4C:     63.8 ml LV SV MOD BP:      34.2 ml RIGHT VENTRICLE RV Basal diam:  3.76 cm LEFT ATRIUM             Index       RIGHT ATRIUM           Index LA diam:        4.90 cm 2.61 cm/m  RA Area:     19.80 cm LA Vol (A2C):   59.5 ml 31.67 ml/m RA Volume:   61.10 ml  32.52 ml/m LA Vol (A4C):   57.5 ml 30.61 ml/m LA Biplane Vol: 61.2 ml 32.58 ml/m  AORTIC VALVE                   PULMONIC VALVE AV Area (Vmax):  2.51 cm    PV Vmax:       0.76 m/s AV Area (Vmean):   2.43 cm    PV Vmean:      53.200 cm/s AV Area (VTI):     2.46 cm    PV VTI:        0.126 m AV Vmax:           130.00 cm/s PV Peak grad:  2.3 mmHg AV Vmean:          83.500 cm/s PV Mean grad:  1.0 mmHg AV VTI:            0.225 m AV Peak Grad:      6.8 mmHg AV Mean Grad:      3.0 mmHg LVOT Vmax:         104.00 cm/s LVOT Vmean:        64.700 cm/s LVOT VTI:          0.176 m LVOT/AV VTI ratio: 0.78  AORTA Ao Root diam: 3.10 cm MITRAL VALVE                 TRICUSPID VALVE MV Area (PHT): 3.00 cm     TR Peak grad:   28.9 mmHg MV Peak grad:  7.4 mmHg     TR Vmax:        269.00 cm/s MV Mean grad:  3.0 mmHg MV Vmax:       1.36 m/s     SHUNTS MV Vmean:      86.9 cm/s    Systemic VTI:  0.18 m MV Decel Time: 253 msec     Systemic Diam: 2.00 cm MV E velocity: 109.50 cm/s MV A velocity: 137.50 cm/s MV E/A ratio:  0.80 Bartholome Bill MD Electronically signed by Bartholome Bill MD Signature Date/Time: 04/04/2020/4:54:02 PM    Final    US Abdomen Limited RUQ (LIVER/GB)  Result Date: 04/11/2020 CLINICAL DATA:  Hyperbilirubinemia in a 74 year old female EXAM: ULTRASOUND ABDOMEN LIMITED RIGHT UPPER QUADRANT COMPARISON:  April 07, 2020 CT angiography of the chest and CT of the chest of April 06, 2020 FINDINGS: Gallbladder: Wall thickness at upper limits of normal. No visible pericholecystic fluid or evidence of cholelithiasis. No reported tenderness over the gallbladder. Common bile duct: Diameter: 2.2 mm Liver: Mildly heterogeneous echotexture with nodular contour and suggestion of fissural widening particularly on image 22. No visible lesion on submitted images. Portal vein is patent on color Doppler imaging with normal direction of blood flow towards the liver. Other: Trace ascites IMPRESSION: 1. Heterogeneous, mildly heterogeneous hepatic echotexture with question of lobular contour and fissural widening, findings could be seen in the setting of early liver disease. 2. Gallbladder wall thickness at upper limits of normal could be seen in the setting of liver disease and is nonspecific, not associated with biliary calculi, reported tenderness or biliary duct dilation. Electronically Signed   By: Zetta Bills M.D.   On: 04/11/2020 12:23          ASSESSMENT/PLAN   Acute on chronic hypoxemic and hypercanic respiratory failure -Due to acute exacerbation of CHF as well as right lower lobe pneumonia with pulmonary edema -BNP is severely elevated over 2500  -Patient  presented with mild leukocytosis with left shift suggestive of possible infectious etiology -ABG-metablic alk with resp compensation acute -due to most likely volume contracted state -Procalcitonin trend -s/p ID consult - on Unasyn now, diarreah resolved  - pharmacy consult for dosing.  -12/15- DCD lasix , DCD  solumedrol , started PO prednisone taper at 44m po daily  04/14/20- patient on bubbler 8L.  04/14/20- patient weaned to 7Joseph 04/17/20- weaned to 5L/min, mentation is lucid GCS 10.  Discussed chest impaging with daughter. Prednisone tapering to 30 mg 04/17/20.  Conitnue with PT 04/18/20-  Increased O2 req, patietnt is 16L net negative now. Continue PT.OT/chest PT for atelectasis 12/21- weaned to 6L.  Patient asking for comfort measures.  Palliative care evaluation in process.    Altered mental status with confusion-improved  -severe hypercapnic 3encephalopathy -Meds have been adjusted and mentation is improved -will obtain ABG to evaluate hypercanic encephalopathy -continue on BIPAP -[facility] - BIPAP qualifiacation - spirometry in process s/p ABG       Thank you for allowing me to participate in the care of this patient.    Patient/Family are satisfied with care plan and all questions have been answered.   This document was prepared using Dragon voice recognition software and may include unintentional dictation errors.     FOttie Glazier M.D.  Division of [facility]

## 2020-04-19 NOTE — Plan of Care (Addendum)
  Problem: Education: Goal: Knowledge of General Education information will improve Description: Including pain rating scale, medication(s)/side effects and non-pharmacologic comfort measures Outcome: Progressing   Problem: Health Behavior/Discharge Planning: Goal: Ability to manage health-related needs will improve Outcome: Progressing   Problem: Clinical Measurements: Goal: Ability to maintain clinical measurements within normal limits will improve Outcome: not Progressing   Problem: Activity: Goal: Risk for activity intolerance will decrease Outcome: not Progressing

## 2020-04-19 NOTE — Progress Notes (Signed)
Objective Swallowing Evaluation: Type of Study: MBS-Modified Barium Swallow Study   Patient Details  Name: Martha [name] MRN: [id number] Date of Birth: Mar 21, 1946  Today's Date: 04/19/2020 Time: SLP Start Time (ACUTE ONLY): 0805 -SLP Stop Time (ACUTE ONLY): 0825  SLP Time Calculation (min) (ACUTE ONLY): 20 min   Past Medical History:  Past Medical History:  Diagnosis Date   Abnormal LFTs (liver function tests)    Anxiety    Arthritis    knees, feet   Asthma    Chronic bronchitis (HCC)    Chronic constipation    Chronic low back pain    Cigarette smoker    Has cut back Smoking to 1 pack every other day   COPD (chronic obstructive pulmonary disease) ([place])    Depression    Diabetic neuropathy ([place])    Diverticulitis 2015   gi recommended repeat scope in 10 years   Essential hypertension    Fatty liver    GERD (gastroesophageal reflux disease)    Gout    History of mammogram 05/28/2013   Hyperlipidemia    Hypothyroidism    Insomnia    Iron deficiency anemia    Low serum vitamin D    Personal history of tobacco use, presenting hazards to health 09/29/2015   Plantar fasciitis    RLS (restless legs syndrome)    Shortness of breath dyspnea    Thyroid cancer ([place]) 2009   Tremors of nervous system    "I think I have parkinson's disease"   Type 2 diabetes mellitus ([place])    Wears dentures    uppers   Past Surgical History:  Past Surgical History:  Procedure Laterality Date   CATARACT EXTRACTION W/PHACO Left 11/24/2015   Procedure: CATARACT EXTRACTION PHACO AND INTRAOCULAR LENS PLACEMENT ([place]);  Surgeon: Birder Robson, MD;  Location: [facility] ORS;  Service: Ophthalmology;  Laterality: Left;  [place] 38.3 AP% 20.6 CDE 7.89 FLUID PACK LOT # [id number]   CATARACT EXTRACTION W/PHACO Right 06/23/2019   Procedure: CATARACT EXTRACTION PHACO AND INTRAOCULAR LENS PLACEMENT ([place]) RIGHT DIABETIC;  Surgeon: Birder Robson, MD;  Location: [place];  Service: Ophthalmology;   Laterality: Right;  Diabetic   COLONOSCOPY  2004, 2009, 2015   [place]   POLYPECTOMY  2009   THYROIDECTOMY  2009   states she was tx with radiactive iodine   TUBAL LIGATION     UPPER GASTROINTESTINAL ENDOSCOPY     HPI: Pt  is a 74 y.o. female with a medical history significant for Multiple issues including GERD, restless leg syndrome, essential tremors, HTN, DM, COPD, tobacco use, gout, depression/anxiety and chronic lower extremity edema/cellulitis.  Patient [facility] the [facility] ED [facility]20 for leg edema and worsening shortness of breath and was found with O2 saturations in the 80's. She was found to have a BNP of 1113 and imaging showed mild cardiomegaly and pulmonary edema. Her lab work also showed worsening proteinuria. She was admitted for further work up.  per Pulmonary, pt has dx of Acute on chronic hypoxemic respiratory failure; BiPAP at night, on increased [place] O2 support during the day.  Pt has some degree of Cognitive decline - decreased attention and follow through w/ tasks w/out cues; often calling out Loudly.  CXR: Unchanged multifocal patchy airspace opacities within both lungs  which could be due to multifocal pneumonia.   Subjective: Bloody sputum on pt's chin and inside her mask when she arrived    Assessment / Plan / Recommendation  CHL IP CLINICAL IMPRESSIONS 04/19/2020  Clinical Impression Patient presents with moderately severe oropharyngeal dysphagia, with trace silent aspiration of thin and nectar thick liquids and laryngeal penetration of honey thick liquids and puree. When pt arrived to fluoro, she had coughed bloody-tinged secretions into her mask. Oral stage is characterized by sluggish, discoordinated bolus formation, decreased bolus cohesion with premature spillage to the pharynx. Swallow initiation is delayed to the level of the pyriform sinuses, resulting in delayed laryngeal closure and penetration of all consistencies before the swallow, aspiration of thin and nectar  thick liquids during the swallow which were not sensed by the patient. Penetration/aspiration appears to occur due to impaired timing/sensation vs pharyngeal weakness. She is only intermittently able to follow commands for throat clear/cough, which was marginally effective at clearing shallower penetration. There is intermittent mild residue in the oral cavity, with spillover occurring to the valleculae between frames. A chin tuck was attempted, with max cues required; this did not prevent penetration or aspiration (with nectar). Smaller (1/2-1 tsp) size boluses of thicker consistencies were better contained in the valleculae, reducing potential for aspiration from premature spill.  Recommend dys 1 (puree) and honey thick liquids by teaspoon, with assist for self-feeding and cuing for throat clearing intermittently to eject laryngeal penetration. Patient is at risk for aspiration regardless of consistency, although there may be some potential for improvement if her mental status improves.  SLP Visit Diagnosis Dysphagia, oropharyngeal phase (R13.12)  Attention and concentration deficit following --  Frontal lobe and executive function deficit following --  Impact on safety and function Moderate aspiration risk;Severe aspiration risk      CHL IP TREATMENT RECOMMENDATION 04/19/2020  Treatment Recommendations Therapy as outlined in treatment plan below     Prognosis 04/19/2020  Prognosis for Safe Diet Advancement Fair  Barriers to Reach Goals Time post onset;Severity of deficits;Behavior  Barriers/Prognosis Comment --    CHL IP DIET RECOMMENDATION 04/19/2020  SLP Diet Recommendations Dysphagia 1 (Puree) solids;Honey thick liquids  Liquid Administration via Spoon  Medication Administration Crushed with puree  Compensations Minimize environmental distractions;Slow rate;Small sips/bites;Lingual sweep for clearance of pocketing;Clear throat intermittently  Postural Changes Seated upright at 90 degrees       CHL IP OTHER RECOMMENDATIONS 04/19/2020  Recommended Consults --  Oral Care Recommendations Oral care QID  Other Recommendations Order thickener from pharmacy;Prohibited food (jello, ice cream, thin soups)      CHL IP FOLLOW UP RECOMMENDATIONS 04/19/2020  Follow up Recommendations Other (comment)      CHL IP FREQUENCY AND DURATION 04/19/2020  Speech Therapy Frequency (ACUTE ONLY) min 2x/week  Treatment Duration 1 week           CHL IP ORAL PHASE 04/19/2020  Oral Phase Impaired  Oral - Pudding Teaspoon --  Oral - Pudding Cup --  Oral - Honey Teaspoon Lingual/palatal residue;Decreased bolus cohesion;Premature spillage  Oral - Honey Cup NT  Oral - Nectar Teaspoon Lingual/palatal residue;Decreased bolus cohesion;Premature spillage  Oral - Nectar Cup Lingual/palatal residue;Decreased bolus cohesion;Premature spillage  Oral - Nectar Straw NT  Oral - Thin Teaspoon Lingual/palatal residue;Decreased bolus cohesion;Premature spillage  Oral - Thin Cup Lingual/palatal residue;Decreased bolus cohesion;Premature spillage  Oral - Thin Straw NT  Oral - Puree Lingual/palatal residue;Decreased bolus cohesion;Premature spillage  Oral - Mech Soft --  Oral - Regular --  Oral - Multi-Consistency --  Oral - Pill --  Oral Phase - Comment --    CHL IP PHARYNGEAL PHASE 04/19/2020  Pharyngeal Phase Impaired  Pharyngeal- Pudding Teaspoon --  Pharyngeal --  Pharyngeal-  Pudding Cup --  Pharyngeal --  Pharyngeal- Honey Teaspoon Delayed swallow initiation-pyriform sinuses;Penetration/Aspiration before swallow  Pharyngeal Material enters airway, remains ABOVE vocal cords then ejected out;Material enters airway, remains ABOVE vocal cords and not ejected out  Pharyngeal- Honey Cup NT  Pharyngeal --  Pharyngeal- Nectar Teaspoon Delayed swallow initiation-pyriform sinuses;Penetration/Aspiration before swallow;Penetration/Aspiration during swallow;Trace aspiration;Compensatory strategies attempted  (with notebox)  Pharyngeal Material enters airway, remains ABOVE vocal cords and not ejected out;Material enters airway, passes BELOW cords without attempt by patient to eject out (silent aspiration)  Pharyngeal- Nectar Cup Delayed swallow initiation-pyriform sinuses;Penetration/Aspiration before swallow;Penetration/Aspiration during swallow;Trace aspiration  Pharyngeal Material enters airway, remains ABOVE vocal cords and not ejected out;Material enters airway, passes BELOW cords without attempt by patient to eject out (silent aspiration)  Pharyngeal- Nectar Straw NT  Pharyngeal --  Pharyngeal- Thin Teaspoon Delayed swallow initiation-pyriform sinuses;Penetration/Aspiration before swallow  Pharyngeal Material enters airway, remains ABOVE vocal cords and not ejected out  Pharyngeal- Thin Cup Delayed swallow initiation-pyriform sinuses;Penetration/Aspiration before swallow;Penetration/Aspiration during swallow;Trace aspiration  Pharyngeal Material enters airway, remains ABOVE vocal cords and not ejected out;Material enters airway, passes BELOW cords without attempt by patient to eject out (silent aspiration)  Pharyngeal- Thin Straw NT  Pharyngeal --  Pharyngeal- Puree Delayed swallow initiation-pyriform sinuses;Penetration/Aspiration before swallow  Pharyngeal Material enters airway, remains ABOVE vocal cords then ejected out;Material enters airway, remains ABOVE vocal cords and not ejected out  Pharyngeal- Mechanical Soft --  Pharyngeal --  Pharyngeal- Regular --  Pharyngeal --  Pharyngeal- Multi-consistency --  Pharyngeal --  Pharyngeal- Pill --  Pharyngeal --  Pharyngeal Comment --     CHL IP CERVICAL ESOPHAGEAL PHASE 04/19/2020  Cervical Esophageal Phase WFL  Pudding Teaspoon --  Pudding Cup --  Honey Teaspoon --  Honey Cup --  Nectar Teaspoon --  Nectar Cup --  Nectar Straw --  Thin Teaspoon --  Thin Cup --  Thin Straw --  Puree --  Mechanical Soft --  Regular --   Multi-consistency --  Pill --  Cervical Esophageal Comment --    Deneise Lever, MS, CCC-SLP Speech-Language Pathologist  Martha [name] 04/19/2020, 12:16 PM

## 2020-04-20 DIAGNOSIS — I5031 Acute diastolic (congestive) heart failure: Secondary | ICD-10-CM

## 2020-04-20 LAB — MAGNESIUM: Magnesium: 2 mg/dL (ref 1.7–2.4)

## 2020-04-20 LAB — GLUCOSE, CAPILLARY
Glucose-Capillary: 104 mg/dL — ABNORMAL HIGH (ref 70–99)
Glucose-Capillary: 209 mg/dL — ABNORMAL HIGH (ref 70–99)
Glucose-Capillary: 202 mg/dL — ABNORMAL HIGH (ref 70–99)

## 2020-04-20 MED ORDER — CHLORHEXIDINE GLUCONATE CLOTH 2 % EX PADS
6.0000 | MEDICATED_PAD | Freq: Every day | CUTANEOUS | Status: DC
  Administered 2020-04-20 – 2020-04-21 (×2): 6 via TOPICAL

## 2020-04-20 NOTE — Progress Notes (Signed)
SLP Cancellation Note  Patient Details Name: Martha [name] MRN: [id number] DOB: [birth year]   Cancelled treatment:       Reason Eval/Treat Not Completed:  (chart reviewed; consulted NSG re: pt's status). Per NSG report today, pt is tolerating her dysphagia diet w/ no overt s/s of aspiration. MBSS yesterday revealed pt had trace aspiration w/ thin and Nectar liquids; no aspiration of Honey consistency liquids. Per recommendations of MBSS, pt's diet is Dysphagia 1 (puree) w/ Honey consistency liquids w/ aspiration precautions. Pt requires support at all meals. Palliative Care has been following for [place]; pt is d/t d/c home w/ Hospice services. Thickened liquids and thickener to modify thin liquids will be provided along w/ education on such at D/C.     Orinda Kenner, MS, CCC-SLP Speech Language Pathologist Rehab Services [phone] [facility] 04/20/2020, 1:23 PM

## 2020-04-20 NOTE — Plan of Care (Signed)

## 2020-04-20 NOTE — Progress Notes (Signed)
Palliative:   [name], Martha [name], is resting quietly in bed.  She appears acutely/chronically ill and quite frail.  She will briefly make but not keep eye contact.  She is able to make her needs known, but will often defer to her daughter/healthcare power of attorney, Abelino Derrick.  She states frequently that she would like for [name] to make choices for her     Daughter/HC POA, Abelino Derrick, and I chatted at bedside.  We talked about [name]'s acute and chronic health concerns.  [name] shares that [name] has been smoking up to 3 packs of cigarettes per day.  She states that she has seen a decline in her mother since her last hospitalization.  [name] states that she has spoken with her brothers, [name] and Otila Kluver, and they are coming to visit within the next few days.  We asked Quanita what is important to her, what she wants this time to look like and feel like.  She shares that she does not want further invasive testing, no more needle sticks.  [name] verifies that this is Veterinary surgeon.  We talked about going home with the benefits of hospice care.  Glenda is agreeable to at home hospice.  Choice offered.  [name] states that her father-in-law had [employer] and she would like to use them for [facility].     We talk about what is and is not provided with hospice services. [name] shares that she is familiar with hospice care from her father in [place] experience with [facility] hospice services.    We talk about current diet recommendations of pured with honey thick liquids.  We talked about pleasure foods and the balance with risk for aspiration.  We talk about unburdening [name] from medications that are not changing her health, and keeping/adding those for comfort. Medications adjusted.   We talked about preferred place of death.  At this point [name] does not answer when asked if she has a preferred place of death.  Conference with attending, bedside nursing staff, [facility] team and  hospice liaison related to   Plan:   Home with the benefits of [facility] hospice for, initially, treat the treatable care.  Anticipate transition to full comfort care within weeks.      Prognosis:  4-8 weeks would be expected.    Medications will need to be in liquid form if possible     65 minutes, extended time   Quinn Axe, NP Palliative Medicine Team  Team Phone [phone] Greater than 50% of this time was spent counseling and coordinating care related to the above assessment and plan.

## 2020-04-20 NOTE — Progress Notes (Addendum)
PROGRESS NOTE    [name]   JQB:[id number]  DOB: 05/03/1945  PCP: Tracie Harrier, MD    DOA: 04/03/2020 LOS: 65   Brief Narrative   From H&P: "Martha [name] is a 74 y.o. female with medical history significant of  HTN, HLD, DM, COPD, tobacco abuse, hypothyroidism, gout, depression, tremor, proteinuria, depression, anxiety, GERD, diverticulitis, who presents with bilateral leg edema and pain, and SOB.   Patient states that she has chronic bilateral leg edema which has been progressively worsened recently. Both lower legs are painful and erythematous. She states that she gets her lower extremities wrapped once a week.  She has chronic cough and shortness of breath which has slightly worsened on exertion.  She coughs up some white mucus.  No fever or chills.  No chest pain.  On presentation she was hypoxic requiring BiPAP, later transitioned to 4 L of oxygen. Patient also has an history of proteinuria which is being worked up by nephrology as an outpatient and they are planning for biopsy.   Patient had pertinent labs positive for BNP of 1113, troponin and COVID-19 negative.  Chest x-ray with borderline cardiomegaly and mild pulmonary edema.  Significant edema of bilateral lower extremities with signs of venous congestion, skin peeling and few shallow ulcers.  Cardiology was consulted."  Pt required Bipap again and close monitoring in step down unit on 12/11.  Was resumed on antibiotics for suspected aspiration pneumonia and steroids.  Patient has remained encephalopathic, aspirating, fairly significant oxygen requirement.   12/22 -family met with palliative care and have decided on pursuing hospice care at home.     Assessment & Plan   Principal Problem:   Acute CHF (congestive heart failure) (HCC) Active Problems:   COPD exacerbation (HCC)   Diabetes mellitus without complication ([place])   Current tobacco use   Hyperlipidemia   Essential hypertension   Hypothyroidism    Gout   Depression   Tremors of nervous system   Acute respiratory failure with hypoxia (HCC)   Cellulitis of lower extremity   Acute pulmonary edema (HCC)   Elevated brain natriuretic peptide (BNP) level   Nephrotic syndrome   Malnutrition of moderate degree    Comfort measures only status -as of 12/22. --Comfort measures per orders --Notify provider if any signs of pain or discomfort --Appreciate palliative care and hospice assistance   Prior assessment and plan per note of 12/21 by Dr. Lonny Prude:  "Acute respiratory failure with hypoxia and hypercapnia Multifactorial in setting of acute on chronic diastolic heart failure, COPD exacerbation, aspiration pneumonia.  Patient required as much as 10 L/min and 60% FiO2 via high flow nasal cannula in addition to be managed with BiPAP. Weaned to as low as 5 L oxygen/min but now back up to 6 Lpm -Wean oxygen as able  Acute on chronic diastolic heart failure Patient managed with aggressive IV diuresis which included Lasix drip from 12/6 until 12/10.  Patient was then transitioned to IV Lasix intermittently secondary to initial overdiuresis.  Echo from 12/6 significant for an EF of 55 to 60% with grade 1 diastolic dysfunction.  Cardiology was consulted with recommendations for outpatient follow-up. Weight on admission of 82.5 kg. Weight today of 66.8 kg. UOP over the last 24 hours of 150 mL with multiple unmeasured occurrences. Net IO Since Admission: -[id number] mL [04/19/20 1412] -Continue Coreg and losartan; discontinue metoprolol on discharge  Aspiration pneumonia Right lower lobe pneumonia from aspiration. Patient treated with Zosyn IV which was transitioned to  Unasyn IV. She completed 7 days of antibiotics  Dysphagia Patient appears to have continued issues with aspiration which may be affecting her respiratory status at this time. Patient is tremendously weak which is likely affecting her ability to swallow safely as well. At this time it  may be beneficial for palliative care consult in case we are not able to significantly improve dysphagia issues. Patient with evidence of silent aspiration per SLP. Diet adjusted to puree and honey thickened liquids. -Continue SLP therapy -Palliative care consulted and recommendations pending  COPD exacerbation Patient managed with IV Solu-Medrol, DuoNeb.  Currently on a prednisone taper. -Pulmonology recommendations: Prednisone taper  Metabolic alkalosis Appears to be secondary to contraction alkalosis from diuresis.  Patient with compensatory hypercapnia.  Alkalosis is improving with decrease of Lasix therapy.  CO2 on BMP is normal.  Acute urinary retention Unknown etiology. Foley placed on 12/14 for recurrent retention. Urinalysis not suggestive of infection and urine culture with no growth. Foley removed 12/19 -Voiding trial; watch UOP carefully. If requires reinsertion of foley catheter, will need outpatient urology follow-up -If goals of care change, can replace foley for comfort  Primary hypertension -Continue amlodipine, Coreg, losartan  Hyperbilirubinemia Transient in setting of fluid overload. Abdominal ultrasound significant for possible early liver disease. Resolved.  Nephrotic syndrome Nephrology consulted.  Urinalysis significant for large proteinuria.  Normal albumin.  Patient initially managed with IV Lasix which is now been discontinued.  Patient received intermittent Lasix as mentioned above.  Initial plan for biopsy which was unable to be performed secondary to patient's inability to tolerate biopsy procedure. -Nephrology recommendations  Bilateral peripheral venous stasis Unna boots applied.  Hypokalemia Repleted.  In setting of IV diuresis.  Diabetes mellitus, type II Patient is on metformin and glipizide as an outpatient.  Managed on Lantus 20 units in addition to sliding scale while inpatient. -Continue Lantus 20 units and sliding scale  insulin  Hypothyroidism -Continue Synthroid 150 mcg daily  Hyperlipidemia -Continue Crestor 5 mg daily  Moderate malnutrition"    DVT prophylaxis:    Diet:  Diet Orders (From admission, onward)    Start     Ordered   04/19/20 0844  DIET - DYS 1 Room service appropriate? Yes; Fluid consistency: Honey Thick  Diet effective now       Comments: Full supervision and assistance with meals. Use 1/2-1 tsp amounts for solids and liquids. Meds crushed with puree. Have pt clear her throat after swallow if she is able.  Question Answer Comment  Room service appropriate? Yes   Fluid consistency: Honey Thick      04/19/20 0845            Code Status: DNR    Subjective 04/20/20    Patient seen at bedside this morning.  I saw patient after palliative care met with patient and family earlier.  Patient denies any pain.  Says breathing is so-so, currently not short of breath.  Denies abdominal pain nausea or vomiting.    Per palliative NP, family have decided on comfort measures and home hospice.  Set up is underway.   Disposition Plan & Communication   Status is: Inpatient  Remains inpatient appropriate because:Inpatient level of care appropriate due to severity of illness.  Discharge home tomorrow once confirmed DME has been delivered home.   Dispo:  Patient From: Home  Planned Disposition: Home Hospice  Expected discharge date: 04/21/2020  Medically stable for discharge: No  Family Communication: None at bedside during encounter, will attempt to  call daughter   Consults, Procedures, Significant Events   Consultants:   Cardiology  Infectious disease  Nephrology  Pulmonology  Palliative care  Procedures:   Transthoracic echo on 12/6 -EF 6789%, grade 1 diastolic dysfunction, mild biatrial dilation  Antimicrobials:  Anti-infectives (From admission, onward)   Start     Dose/Rate Route Frequency Ordered Stop   04/13/20 0330  vancomycin (VANCOREADY) IVPB 500  mg/100 mL  Status:  Discontinued        500 mg 100 mL/hr over 60 Minutes Intravenous Every 12 hours 04/12/20 1458 04/12/20 1715   04/12/20 1815  ampicillin-sulbactam (UNASYN) 1.5 g in sodium chloride 0.9 % 100 mL IVPB  Status:  Discontinued        1.5 g 200 mL/hr over 30 Minutes Intravenous Every 6 hours 04/12/20 1715 04/15/20 1520   04/12/20 1515  vancomycin (VANCOREADY) IVPB 1500 mg/300 mL  Status:  Discontinued        1,500 mg 150 mL/hr over 120 Minutes Intravenous  Once 04/12/20 1428 04/12/20 1715   04/08/20 2200  vancomycin (VANCOREADY) IVPB 750 mg/150 mL  Status:  Discontinued        750 mg 150 mL/hr over 60 Minutes Intravenous Every 12 hours 04/08/20 1005 04/08/20 1318   04/08/20 1100  vancomycin (VANCOREADY) IVPB 1750 mg/350 mL  Status:  Discontinued        1,750 mg 175 mL/hr over 120 Minutes Intravenous  Once 04/08/20 1005 04/08/20 1318   04/08/20 1100  azithromycin (ZITHROMAX) tablet 500 mg        500 mg Oral Daily 04/08/20 1005 04/11/20 0959   04/08/20 1015  piperacillin-tazobactam (ZOSYN) IVPB 3.375 g  Status:  Discontinued        3.375 g 12.5 mL/hr over 240 Minutes Intravenous Every 8 hours 04/08/20 1005 04/12/20 1715   04/07/20 0600  cephALEXin (KEFLEX) capsule 500 mg        500 mg Oral Every 8 hours 04/06/20 1146 04/07/20 2121   04/05/20 1000  cefTRIAXone (ROCEPHIN) 2 g in sodium chloride 0.9 % 100 mL IVPB  Status:  Discontinued        2 g 200 mL/hr over 30 Minutes Intravenous Every 24 hours 04/04/20 1525 04/06/20 1146   04/04/20 1100  cefTRIAXone (ROCEPHIN) 2 g in sodium chloride 0.9 % 100 mL IVPB  Status:  Discontinued        2 g 200 mL/hr over 30 Minutes Intravenous Every 24 hours 04/04/20 1023 04/04/20 1510   04/03/20 1500  cefTRIAXone (ROCEPHIN) 1 g in sodium chloride 0.9 % 100 mL IVPB  Status:  Discontinued        1 g 200 mL/hr over 30 Minutes Intravenous Every 24 hours 04/03/20 1445 04/04/20 1023       Objective   Vitals:   04/20/20 0809 04/20/20 1138  04/20/20 1329 04/20/20 1614  BP: (!) 131/117 138/71  (!) 118/43  Pulse: 60 (!) 55  (!) 54  Resp: '18 18  16  ' Temp: 97.9 F (36.6 C) 97.7 F (36.5 C)  98.2 F (36.8 C)  TempSrc:  Oral  Oral  SpO2: 90% 94% 91% 90%  Weight:      Height:        Intake/Output Summary (Last 24 hours) at 04/20/2020 1737 Last data filed at 04/19/2020 2238 Gross per 24 hour  Intake 358 ml  Output 0 ml  Net 358 ml   Filed Weights   04/17/20 0340 04/19/20 0414 04/20/20 0400  Weight: 67 kg 66.8 kg  66.6 kg    Physical Exam:  General exam: awake, alert, no acute distress HEENT: moist mucus membranes, hearing grossly normal  Respiratory system: Diffuse rhonchi versus referred upper airway secretion sounds, normal respiratory effort, on 4.5 L/min oxygen by nasal cannula. Cardiovascular system: normal S1/S2, RRR Central nervous system: Unable to fully evaluate due to patient's mental status and inability to follow commands.  Moves all limbs.  Grossly nonfocal findings. Extremities: moves all, no edema, normal tone   Labs   Data Reviewed: I have personally reviewed following labs and imaging studies  CBC: Recent Labs  Lab 04/14/20 0530 04/15/20 0417 04/16/20 0440 04/17/20 0354 04/19/20 0544  WBC 14.1* 14.0* 10.6* 11.7* 8.6  HGB 14.9 14.3 13.3 13.4 13.3  HCT 47.3* 45.6 42.0 41.4 40.5  MCV 90.6 91.0 89.6 87.3 86.0  PLT 188 180 188 222 944   Basic Metabolic Panel: Recent Labs  Lab 04/14/20 0530 04/15/20 0417 04/16/20 0440 04/17/20 0354 04/18/20 0451 04/19/20 0544 04/20/20 0419  NA 145 142 137 132*  --  133*  --   K 4.3 4.4 3.9 3.5  --  3.1*  --   CL 99 101 95* 93*  --  94*  --   CO2 35* 31 33* 31  --  31  --   GLUCOSE 173* 98 109* 94  --  79  --   BUN 29* 26* 27* 28*  --  20  --   CREATININE 0.47 0.50 0.42* 0.54  --  0.41*  --   CALCIUM 9.2 9.2 8.6* 8.4*  --  8.1*  --   MG 2.3 2.2 2.1 2.2 2.0 2.0 2.0   GFR: Estimated Creatinine Clearance: 57.1 mL/min (A) (by C-G formula based on  SCr of 0.41 mg/dL (L)). Liver Function Tests: Recent Labs  Lab 04/14/20 0530 04/15/20 0417  AST 35 27  ALT 41 44  ALKPHOS 118 150*  BILITOT 1.3* 1.2  PROT 6.3* 6.0*  ALBUMIN 3.8 3.5   No results for input(s): LIPASE, AMYLASE in the last 168 hours. No results for input(s): AMMONIA in the last 168 hours. Coagulation Profile: No results for input(s): INR, PROTIME in the last 168 hours. Cardiac Enzymes: No results for input(s): CKTOTAL, CKMB, CKMBINDEX, TROPONINI in the last 168 hours. BNP (last 3 results) No results for input(s): PROBNP in the last 8760 hours. HbA1C: No results for input(s): HGBA1C in the last 72 hours. CBG: Recent Labs  Lab 04/19/20 1609 04/19/20 1959 04/20/20 0805 04/20/20 1139 04/20/20 1615  GLUCAP 326* 162* 104* 209* 202*   Lipid Profile: No results for input(s): CHOL, HDL, LDLCALC, TRIG, CHOLHDL, LDLDIRECT in the last 72 hours. Thyroid Function Tests: No results for input(s): TSH, T4TOTAL, FREET4, T3FREE, THYROIDAB in the last 72 hours. Anemia Panel: No results for input(s): VITAMINB12, FOLATE, FERRITIN, TIBC, IRON, RETICCTPCT in the last 72 hours. Sepsis Labs: No results for input(s): PROCALCITON, LATICACIDVEN in the last 168 hours.  Recent Results (from the past 240 hour(s))  Urine Culture     Status: None   Collection Time: 04/10/20  6:21 PM   Specimen: Urine, Random  Result Value Ref Range Status   Specimen Description   Final    URINE, RANDOM Performed at [facility], [address], [place], [place] [address]    Special Requests   Final    NONE Performed at [facility], [address], [place], [place] [address]    Culture   Final    NO GROWTH Performed at  [facility], [place] [address] [place], [place] [address]    Report Status 04/11/2020 FINAL  Final  MRSA PCR Screening     Status: None   Collection Time: 04/12/20  3:25 PM   Specimen: Nasopharyngeal  Result Value Ref Range Status   MRSA by PCR  NEGATIVE NEGATIVE Final    Comment:        The GeneXpert MRSA Assay (FDA approved for NASAL specimens only), is one component of a comprehensive MRSA colonization surveillance program. It is not intended to diagnose MRSA infection nor to guide or monitor treatment for MRSA infections. Performed at [facility], [address], [place], [place] [address]       Imaging Studies   DG Swallowing [facility]  Result Date: 04/19/2020 Objective Swallowing Evaluation: Type of Study: MBS-Modified Barium Swallow Study  Patient Details Name: DAILIN SOSNOWSKI MRN: [id number] Date of Birth: 05-Aug-1945 Today's Date: 04/19/2020 Time: SLP Start Time (ACUTE ONLY): 0805 -SLP Stop Time (ACUTE ONLY): 0825 SLP Time Calculation (min) (ACUTE ONLY): 20 min Past Medical History: Past Medical History: Diagnosis Date . Abnormal LFTs (liver function tests)  . Anxiety  . Arthritis   knees, feet . Asthma  . Chronic bronchitis ([place])  . Chronic constipation  . Chronic low back pain  . Cigarette smoker   Has cut back Smoking to 1 pack every other day . COPD (chronic obstructive pulmonary disease) ([place])  . Depression  . Diabetic neuropathy ([place])  . Diverticulitis 2015  gi recommended repeat scope in 10 years . Essential hypertension  . Fatty liver  . GERD (gastroesophageal reflux disease)  . Gout  . History of mammogram 05/28/2013 . Hyperlipidemia  . Hypothyroidism  . Insomnia  . Iron deficiency anemia  . Low serum vitamin D  . Personal history of tobacco use, presenting hazards to health 09/29/2015 . Plantar fasciitis  . RLS (restless legs syndrome)  . Shortness of breath dyspnea  . Thyroid cancer ([place]) 2009 . Tremors of nervous system   "I think I have parkinson's disease" . Type 2 diabetes mellitus ([place])  . Wears dentures   uppers Past Surgical History: Past Surgical History: Procedure Laterality Date . CATARACT EXTRACTION W/PHACO Left 11/24/2015  Procedure: CATARACT EXTRACTION PHACO AND INTRAOCULAR LENS PLACEMENT  (IOC);  Surgeon: Birder Robson, MD;  Location: [facility] ORS;  Service: Ophthalmology;  Laterality: Left;  [place] 38.3 AP% 20.6 CDE 7.89 FLUID PACK LOT # [id number] H . CATARACT EXTRACTION W/PHACO Right 06/23/2019  Procedure: CATARACT EXTRACTION PHACO AND INTRAOCULAR LENS PLACEMENT ([place]) RIGHT DIABETIC;  Surgeon: Birder Robson, MD;  Location: [place];  Service: Ophthalmology;  Laterality: Right;  Diabetic . COLONOSCOPY  2004, 2009, 2015  [place] . POLYPECTOMY  2009 . THYROIDECTOMY  2009  states she was tx with radiactive iodine . TUBAL LIGATION   . UPPER GASTROINTESTINAL ENDOSCOPY   HPI: Pt  is a 74 y.o. female with a medical history significant for Multiple issues including GERD, restless leg syndrome, essential tremors, HTN, DM, COPD, tobacco use, gout, depression/anxiety and chronic lower extremity edema/cellulitis.  Patient presented to the [facility] ED 04/03/20 for leg edema and worsening shortness of breath and was found with O2 saturations in the 80's. She was found to have a BNP of 1113 and imaging showed mild cardiomegaly and pulmonary edema. Her lab work also showed worsening proteinuria. She was admitted for further work up.  per Pulmonary, pt has dx of Acute on chronic hypoxemic respiratory failure; BiPAP at night, on increased [place]  O2 support during the day.  Pt has some degree of Cognitive decline - decreased attention and follow through w/ tasks w/out cues; often calling out Loudly.  CXR: Unchanged multifocal patchy airspace opacities within both lungs  which could be due to multifocal pneumonia.  Subjective: Bloody sputum on pt's chin and inside her mask when she arrived Assessment / Plan / Recommendation CHL IP CLINICAL IMPRESSIONS 04/19/2020 Clinical Impression Patient presents with moderately severe oropharyngeal dysphagia, with trace silent aspiration of thin and nectar thick liquids and laryngeal penetration of honey thick liquids and puree. When pt arrived to fluoro, she had coughed bloody-tinged  secretions into her mask. Oral stage is characterized by sluggish, discoordinated bolus formation, decreased bolus cohesion with premature spillage to the pharynx. Swallow initiation is delayed to the level of the pyriform sinuses, resulting in delayed laryngeal closure and penetration of all consistencies before the swallow, aspiration of thin and nectar thick liquids during the swallow which were not sensed by the patient. Penetration/aspiration appears to occur due to impaired timing/sensation vs pharyngeal weakness. She is only intermittently able to follow commands for throat clear/cough, which was marginally effective at clearing shallower penetration. There is intermittent mild residue in the oral cavity, with spillover occurring to the valleculae between frames. A chin tuck was attempted, with max cues required; this did not prevent penetration or aspiration (with nectar). Smaller (1/2-1 tsp) size boluses of thicker consistencies were better contained in the valleculae, reducing potential for aspiration from premature spill.  Recommend dys 1 (puree) and honey thick liquids by teaspoon, with assist for self-feeding and cuing for throat clearing intermittently to eject laryngeal penetration. Patient is at risk for aspiration regardless of consistency, although there may be some potential for improvement if her mental status improves. SLP Visit Diagnosis Dysphagia, oropharyngeal phase (R13.12) Attention and concentration deficit following -- Frontal lobe and executive function deficit following -- Impact on safety and function Moderate aspiration risk;Severe aspiration risk   CHL IP TREATMENT RECOMMENDATION 04/19/2020 Treatment Recommendations Therapy as outlined in treatment plan below   Prognosis 04/19/2020 Prognosis for Safe Diet Advancement Fair Barriers to Reach Goals Time post onset;Severity of deficits;Behavior Barriers/Prognosis Comment -- CHL IP DIET RECOMMENDATION 04/19/2020 SLP Diet Recommendations  Dysphagia 1 (Puree) solids;Honey thick liquids Liquid Administration via Spoon Medication Administration Crushed with puree Compensations Minimize environmental distractions;Slow rate;Small sips/bites;Lingual sweep for clearance of pocketing;Clear throat intermittently Postural Changes Seated upright at 90 degrees   CHL IP OTHER RECOMMENDATIONS 04/19/2020 Recommended Consults -- Oral Care Recommendations Oral care QID Other Recommendations Order thickener from pharmacy;Prohibited food (jello, ice cream, thin soups)   CHL IP FOLLOW UP RECOMMENDATIONS 04/19/2020 Follow up Recommendations Other (comment)   CHL IP FREQUENCY AND DURATION 04/19/2020 Speech Therapy Frequency (ACUTE ONLY) min 2x/week Treatment Duration 1 week      CHL IP ORAL PHASE 04/19/2020 Oral Phase Impaired Oral - Pudding Teaspoon -- Oral - Pudding Cup -- Oral - Honey Teaspoon Lingual/palatal residue;Decreased bolus cohesion;Premature spillage Oral - Honey Cup NT Oral - Nectar Teaspoon Lingual/palatal residue;Decreased bolus cohesion;Premature spillage Oral - Nectar Cup Lingual/palatal residue;Decreased bolus cohesion;Premature spillage Oral - Nectar Straw NT Oral - Thin Teaspoon Lingual/palatal residue;Decreased bolus cohesion;Premature spillage Oral - Thin Cup Lingual/palatal residue;Decreased bolus cohesion;Premature spillage Oral - Thin Straw NT Oral - Puree Lingual/palatal residue;Decreased bolus cohesion;Premature spillage Oral - Mech Soft -- Oral - Regular -- Oral - Multi-Consistency -- Oral - Pill -- Oral Phase - Comment --  CHL IP PHARYNGEAL PHASE 04/19/2020 Pharyngeal Phase Impaired Pharyngeal- Pudding  Teaspoon -- Pharyngeal -- Pharyngeal- Pudding Cup -- Pharyngeal -- Pharyngeal- Honey Teaspoon Delayed swallow initiation-pyriform sinuses;Penetration/Aspiration before swallow Pharyngeal Material enters airway, remains ABOVE vocal cords then ejected out;Material enters airway, remains ABOVE vocal cords and not ejected out Pharyngeal- Honey Cup  NT Pharyngeal -- Pharyngeal- Nectar Teaspoon Delayed swallow initiation-pyriform sinuses;Penetration/Aspiration before swallow;Penetration/Aspiration during swallow;Trace aspiration;Compensatory strategies attempted (with notebox) Pharyngeal Material enters airway, remains ABOVE vocal cords and not ejected out;Material enters airway, passes BELOW cords without attempt by patient to eject out (silent aspiration) Pharyngeal- Nectar Cup Delayed swallow initiation-pyriform sinuses;Penetration/Aspiration before swallow;Penetration/Aspiration during swallow;Trace aspiration Pharyngeal Material enters airway, remains ABOVE vocal cords and not ejected out;Material enters airway, passes BELOW cords without attempt by patient to eject out (silent aspiration) Pharyngeal- Nectar Straw NT Pharyngeal -- Pharyngeal- Thin Teaspoon Delayed swallow initiation-pyriform sinuses;Penetration/Aspiration before swallow Pharyngeal Material enters airway, remains ABOVE vocal cords and not ejected out Pharyngeal- Thin Cup Delayed swallow initiation-pyriform sinuses;Penetration/Aspiration before swallow;Penetration/Aspiration during swallow;Trace aspiration Pharyngeal Material enters airway, remains ABOVE vocal cords and not ejected out;Material enters airway, passes BELOW cords without attempt by patient to eject out (silent aspiration) Pharyngeal- Thin Straw NT Pharyngeal -- Pharyngeal- Puree Delayed swallow initiation-pyriform sinuses;Penetration/Aspiration before swallow Pharyngeal Material enters airway, remains ABOVE vocal cords then ejected out;Material enters airway, remains ABOVE vocal cords and not ejected out Pharyngeal- Mechanical Soft -- Pharyngeal -- Pharyngeal- Regular -- Pharyngeal -- Pharyngeal- Multi-consistency -- Pharyngeal -- Pharyngeal- Pill -- Pharyngeal -- Pharyngeal Comment --  CHL IP CERVICAL ESOPHAGEAL PHASE 04/19/2020 Cervical Esophageal Phase WFL Pudding Teaspoon -- Pudding Cup -- Honey Teaspoon -- Honey Cup --  Nectar Teaspoon -- Nectar Cup -- Nectar Straw -- Thin Teaspoon -- Thin Cup -- Thin Straw -- Puree -- Mechanical Soft -- Regular -- Multi-consistency -- Pill -- Cervical Esophageal Comment -- Deneise Lever, MS, CCC-SLP Speech-Language Pathologist LYLY CANIZALES 04/19/2020, 12:17 PM                Medications   Scheduled Meds: . amLODipine  10 mg Oral Daily  . carvedilol  12.5 mg Oral BID WC  . Chlorhexidine Gluconate Cloth  6 each Topical Daily  . fluticasone  1 spray Each Nare BID  . ipratropium-albuterol  3 mL Nebulization TID  . levothyroxine  150 mcg Oral Q0600  . losartan  100 mg Oral Daily  . mouth rinse  15 mL Mouth Rinse q12n4p  . pantoprazole  40 mg Oral Daily  . predniSONE  20 mg Oral Q breakfast  . primidone  50 mg Oral BID  . sodium chloride flush  3 mL Intravenous Q12H   Continuous Infusions: . sodium chloride 250 mL (04/15/20 1256)       LOS: 17 days    Time spent: 25 minutes with greater than 50% spent in coordination of care and direct patient contact    Ezekiel Slocumb, DO [facility]  04/20/2020, 5:37 PM    If 7PM-7AM, please contact night-coverage. How to contact the [facility] Attending or Consulting provider [place] or covering provider during after hours [place], for this patient?    1. Check the care team in [facility] and look for a) attending/consulting [facility] provider listed and b) the [facility] team listed 2. Log into [email or web address] and use [facility]'s universal password to access. If you do not have the password, please contact the hospital operator. 3. Locate the [facility] provider you are looking for under [facility] and page to a number that you can be directly reached.  4. If you still have difficulty reaching the provider, please page the [facility] (Director on Call) for the Hospitalists listed on amion for assistance.

## 2020-04-20 NOTE — Progress Notes (Signed)
Pulmonary and Critical Care  Medicine          Date: 04/20/2020,   MRN# [id number] Martha [name] 02-11-1946     AdmissionWeight: 72 kg                 CurrentWeight: 66.6 kg  Referring physician: Dr. Si Raider    CHIEF COMPLAINT:   Acute on chronic hypoxemic respiratory failure   HISTORY OF PRESENT ILLNESS   74 year old female with history of diabetes COPD lifelong smoking hypothyroidism gout major depressive disorder, anxiety disorder and GERD, diverticulitis who came in with signs and symptoms of anasarca including peripheral edema and pulmonary edema.  Patient states is been worse than usual despite compliance with wrapping the lower extremities.  She also reported coughing and dyspnea.  Upon presentation to the emergency room she was found to be desaturating in the mid 80s and was placed on 4 L supplemental oxygen with some subsequent improvement of SPO2.  She was then treated with BiPAP and felt better.  She was initially admitted with acute exacerbation of CHF and cardiology was consulted for this.  She was also found to have proteinuria with CKD and had renal consultation placed. Pulmonary consultation placed for acute on chronic hypoxemic respiratory failure with failure to wean down oxygen.  Patient did have CT PE protocol done to rule out acute pulmonary venous embolism which was negative however did show bullous emphysema with overlying pulmonary edema as well as a right lower lobe consolidated infiltrate suggestive of pneumonia as well as right hemidiaphragm elevation due to hepatomegaly.  04/09/20- patient seen at bedside, remains encephalopathic.  Discussed with [facility] attending Dr Si Raider plan for transfer to SDU.     04/10/20- patient is improved.  She is more awake, she was able to speak with me despite BIPAP mask, she has normal 4/4 grip bilaterally and moves LE to verbal communication without encouragment. Mentation has significantly improved. She is flushed red on  examination.    04/11/20- patient is improved.  She is off BIPAP during my evaluation.  She is able to speak and reported right upper extermity pain, this may be related to K infusion. I dicussed this with RN who has already addressed this with IV team. ABG is in progress. Cardiology and nephrology following, UOP is low.   04/13/20- patient is improved.  She is speaking in full sentences. I spoke to her common law husband to review hospital course and care plan.  Patient will need BIPAP for home, ive discussed case with [facility] team and qualification for NIV is in process.  Spirometry is in process. 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  She smokes actively , I have provided smoking cessation counseling today and will be following up on outpatient for cessation therapy.  LE swelling is resolved and sensorium is close to baseline. There is no diarreah  04/14/20- patient resting in bed. Mildly confused. Breathing is diminished bilaterllly , on 8L HF bubbler. ABG tonight.   04/15/20- patient is resting in bed. Vitals are stable.  Seems patient may have been more confused after Haldol but not from ventilation as her ABG is without hypercapnia.  Nocturnal pulse oximetry is pending for home BIPAP  04/16/20- Daughter present during my evaluation.  She shares patient was smoking 2.5 packs daily and recently has been trying to stop. We discussed BIPAP for home.  Reviewed nocturnal pulse oximetry today with mild desaturation overnight. Patient is down to 5L/min [place] and improved.  04/17/20-  Patient is improved shes down to 5L/min.  She has thus far lost appx 35lbs since admission due to fluid overload. Her CO2 is improved and repeat CXR with interval improvement of interstitial infiltrates more notable on right side. Shes slow to improve due to advanced age.  04/18/20- patient is resting in bed in no apparent distress.  She is with weaning protocol on prednisone now on 36m PO.  She had a slight set  back in appears with increased O2 req to 7L/min from 5L/min.   12/21- Patient is in bed resting.  We discussed with husband and he shares patient is suffering, patient agrees and admits she is uncomfortable and further interview patient states she would prefer to have pain control and be left alone.   We have placed Palliatve care consultation.  I have decreased spO2 goal to 85% on 6L/min she is >85% currently.  I discussed plan with heather RT. Husband at bedside agrees to de-escalate to comfort measures, since he is with her for 32years but unmarried he techinically is not POA so we will discuss patients wishes with daughter.    04/20/20- patient is resting in bed in no distress. She is with confusion but O2 req has not changed. S/p Palliative, cardiology and nephrology evaluation today.   PAST MEDICAL HISTORY   Past Medical History:  Diagnosis Date  . Abnormal LFTs (liver function tests)   . Anxiety   . Arthritis    knees, feet  . Asthma   . Chronic bronchitis ([place]   . Chronic constipation   . Chronic low back pain   . Cigarette smoker    Has cut back Smoking to 1 pack every other day  . COPD (chronic obstructive pulmonary disease) ([place]   . Depression   . Diabetic neuropathy ([place]   . Diverticulitis 2015   gi recommended repeat scope in 10 years  . Essential hypertension   . Fatty liver   . GERD (gastroesophageal reflux disease)   . Gout   . History of mammogram 05/28/2013  . Hyperlipidemia   . Hypothyroidism   . Insomnia   . Iron deficiency anemia   . Low serum vitamin D   . Personal history of tobacco use, presenting hazards to health 09/29/2015  . Plantar fasciitis   . RLS (restless legs syndrome)   . Shortness of breath dyspnea   . Thyroid cancer ([place] 2009  . Tremors of nervous system    "I think I have parkinson's disease"  . Type 2 diabetes mellitus ([place]   . Wears dentures    uppers     SURGICAL HISTORY   Past Surgical History:  Procedure Laterality Date   . CATARACT EXTRACTION W/PHACO Left 11/24/2015   Procedure: CATARACT EXTRACTION PHACO AND INTRAOCULAR LENS PLACEMENT (IOC);  Surgeon: WBirder Robson MD;  Location: [facility] ORS;  Service: Ophthalmology;  Laterality: Left;  [place]38.3 AP% 20.6 CDE 7.89 FLUID PACK LOT # [id number]  . CATARACT EXTRACTION W/PHACO Right 06/23/2019   Procedure: CATARACT EXTRACTION PHACO AND INTRAOCULAR LENS PLACEMENT ([place] RIGHT DIABETIC;  Surgeon: PBirder Robson MD;  [place]  Service: Ophthalmology;  Laterality: Right;  Diabetic  . COLONOSCOPY  2004, 2009, 2015   [place]  . POLYPECTOMY  2009  . THYROIDECTOMY  2009   states she was tx with radiactive iodine  . TUBAL LIGATION    . UPPER GASTROINTESTINAL ENDOSCOPY       FAMILY HISTORY   Family History  Problem Relation Age  of Onset  . Diabetes Mellitus II Father   . Lung disease Father   . Diabetes Mellitus II Mother   . Arthritis Mother   . Thyroid disease Other   . Asthma Sister   . Emphysema Sister   . Hypertension Sister   . Breast cancer Paternal Aunt      SOCIAL HISTORY   Social History   Tobacco Use  . Smoking status: Current Every Day Smoker    Packs/day: 1.00    Years: 39.50    Pack years: 39.50    Types: Cigarettes  . Smokeless tobacco: Never Used  . Tobacco comment: curently smokes 12 cigerattes a day. I'm trying to cut back.  Vaping Use  . Vaping Use: Never used  Substance Use Topics  . Alcohol use: No    Alcohol/week: 0.0 standard drinks  . Drug use: No     MEDICATIONS    Home Medication:    Current Medication:  Current Facility-Administered Medications:  .  0.9 %  sodium chloride infusion, 250 mL, Intravenous, PRN, Ivor Costa, MD, Last Rate: 5 mL/hr at 04/15/20 1256, 250 mL at 04/15/20 1256 .  acetaminophen (TYLENOL) suppository 650 mg, 650 mg, Rectal, Q6H PRN, Lang Snow, NP, 650 mg at 04/08/20 2234 .  acetaminophen (TYLENOL) tablet 650 mg, 650 mg, Oral, Q6H PRN, Lorella Nimrod,  MD, 650 mg at 04/19/20 1226 .  albuterol (PROVENTIL) (2.5 MG/3ML) 0.083% nebulizer solution 2.5 mg, 2.5 mg, Nebulization, Q4H PRN, Ivor Costa, MD, 2.5 mg at 04/18/20 1940 .  amLODipine (NORVASC) tablet 10 mg, 10 mg, Oral, Daily, Wouk, Ailene Rud, MD, 10 mg at 04/20/20 1045 .  calcium carbonate (TUMS - dosed in mg elemental calcium) chewable tablet 200 mg of elemental calcium, 1 tablet, Oral, Q8H PRN, Nicole Kindred A, DO, 200 mg of elemental calcium at 04/14/20 1824 .  carvedilol (COREG) tablet 12.5 mg, 12.5 mg, Oral, BID WC, Teodoro Spray, MD, 12.5 mg at 04/20/20 0820 .  Chlorhexidine Gluconate Cloth 2 % PADS 6 each, 6 each, Topical, Daily, Ezekiel Slocumb, DO, 6 each at 04/20/20 1329 .  dextromethorphan-guaiFENesin (MUCINEX DM) 30-600 MG per 12 hr tablet 1 tablet, 1 tablet, Oral, BID PRN, Ivor Costa, MD, 1 tablet at 04/18/20 1113 .  fluticasone (FLONASE) 50 MCG/ACT nasal spray 1 spray, 1 spray, Each Nare, BID, Sharion Settler, NP, 1 spray at 04/20/20 1100 .  fluticasone (FLONASE) 50 MCG/ACT nasal spray 1-2 spray, 1-2 spray, Each Nare, Daily PRN, Ivor Costa, MD .  ipratropium-albuterol (DUONEB) 0.5-2.5 (3) MG/3ML nebulizer solution 3 mL, 3 mL, Nebulization, TID, Ivor Costa, MD, 3 mL at 04/20/20 1326 .  labetalol (NORMODYNE) injection 20 mg, 20 mg, Intravenous, Q2H PRN, Wouk, Ailene Rud, MD, 20 mg at 04/15/20 1252 .  levothyroxine (SYNTHROID) tablet 150 mcg, 150 mcg, Oral, Q0600, Ivor Costa, MD, 150 mcg at 04/20/20 0555 .  losartan (COZAAR) tablet 100 mg, 100 mg, Oral, Daily, Wouk, Ailene Rud, MD, 100 mg at 04/20/20 1046 .  MEDLINE mouth rinse, 15 mL, Mouth Rinse, q12n4p, Wouk, Ailene Rud, MD, 15 mL at 04/20/20 1214 .  nitroGLYCERIN (NITROSTAT) SL tablet 0.4 mg, 0.4 mg, Sublingual, Q5 min PRN, Wouk, Ailene Rud, MD, 0.4 mg at 04/08/20 0659 .  ondansetron (ZOFRAN) injection 4 mg, 4 mg, Intravenous, Q8H PRN, Ivor Costa, MD, 4 mg at 04/20/20 1212 .  pantoprazole (PROTONIX) EC tablet 40  mg, 40 mg, Oral, Daily, Nicole Kindred A, DO, 40 mg at 04/20/20 1047 .  predniSONE (DELTASONE)  tablet 20 mg, 20 mg, Oral, Q breakfast, Jamont Mellin, MD, 20 mg at 04/20/20 0820 .  primidone (MYSOLINE) tablet 50 mg, 50 mg, Oral, BID, Nicole Kindred A, DO, 50 mg at 04/20/20 1047 .  prochlorperazine (COMPAZINE) injection 10 mg, 10 mg, Intravenous, Q6H PRN, Nicole Kindred A, DO, 10 mg at 04/15/20 1729 .  sodium chloride flush (NS) 0.9 % injection 3 mL, 3 mL, Intravenous, Q12H, Ivor Costa, MD, 3 mL at 04/20/20 1048 .  sodium chloride flush (NS) 0.9 % injection 3 mL, 3 mL, Intravenous, PRN, Ivor Costa, MD    ALLERGIES   Nicotine polacrilex, Nicotrol [nicotine], and Codeine sulfate     REVIEW OF SYSTEMS    Review of Systems:  Unable to obtain due to encephalopathy   VS: BP 138/71 (BP Location: Right Arm)   Pulse (!) 55   Temp 97.7 F (36.5 C) (Oral)   Resp 18   Ht '5\' 4"'  (1.626 m)   Wt 66.6 kg   SpO2 91%   BMI 25.20 kg/m      PHYSICAL EXAM    GENERAL:NAD, no fevers, chills, no weakness no fatigue HEAD: Normocephalic, atraumatic.  EYES: Pupils equal, round, reactive to light. Extraocular muscles intact. No scleral icterus.  MOUTH: dry mucosal membrane. Dentition intact. No abscess noted. On BIPAP EAR, NOSE, THROAT: Clear without exudates. No external lesions.  NECK: Supple. No thyromegaly. No nodules. No JVD.  PULMONARY: mild rhonchi bilaterally  CARDIOVASCULAR: S1 and S2. Regular rate and rhythm. No murmurs, rubs, or gallops. No edema. Pedal pulses 2+ bilaterally.  GASTROINTESTINAL: Soft, nontender, nondistended. No masses. Positive bowel sounds. No hepatosplenomegaly.  MUSCULOSKELETAL: No swelling, clubbing, +edema 2++.  NEUROLOGIC: GCS 10  SKIN: No ulceration, lesions, rashes, or cyanosis. Skin warm and dry. Turgor intact.       IMAGING    DG Chest 1 View  Result Date: 04/09/2020 CLINICAL DATA:  Acute respiratory failure EXAM: CHEST  1 VIEW COMPARISON:   04/03/2016 FINDINGS: Normal cardiac silhouette. Interval increase in interstitial edema pattern. No focal consolidation. No pneumothorax. IMPRESSION: Worsening interstitial edema. Electronically Signed   By: Suzy Bouchard M.D.   On: 04/09/2020 06:31   DG Chest 2 View  Result Date: 04/03/2020 CLINICAL DATA:  Hypoxia. Leg edema and shortness of breath. Pt daughter states that pt is supposed to be getting a renal bx but they are not able to do it because of the edema in pts legs. Pt states that she does feel short of breath. Pt does not wear oxygen at home. Pt placed on O2 in triage due to sats being in the 80s. Hx of asthma, chronic bronchitis, COPD, thyroid cancer- 2009, DM, current smoker. EXAM: CHEST - 2 VIEW COMPARISON:  01/04/2018 FINDINGS: Cardiac silhouette is borderline enlarged. Stable aortic atherosclerotic calcifications. No mediastinal or hilar masses. No evidence of adenopathy. Lungs demonstrate diffuse bilateral irregular interstitial thickening which has increased compared to the prior study. There is also linear/reticular scarring in the left lung base, which is stable. No lung consolidation. No pleural effusion and no pneumothorax. Skeletal structures are demineralized, but grossly intact. IMPRESSION: 1. Bilateral irregular interstitial thickening, increased compared to the prior study, along with borderline cardiomegaly. Suspect mild congestive heart failure. Consider diffuse interstitial infection or inflammation in the proper clinical setting. No evidence of lobar pneumonia. Electronically Signed   By: Lajean Manes M.D.   On: 04/03/2020 13:06   CT CHEST W CONTRAST  Result Date: 04/06/2020 CLINICAL DATA:  Hemoptysis. EXAM: CT CHEST WITH  CONTRAST TECHNIQUE: Multidetector CT imaging of the chest was performed during intravenous contrast administration. CONTRAST:  59m OMNIPAQUE IOHEXOL 300 MG/ML  SOLN COMPARISON:  January 06, 2019 FINDINGS: Cardiovascular: There is marked severity  calcification of the aortic arch. There is mild cardiomegaly. No pericardial effusion. Mediastinum/Nodes: There is mild pretracheal and right hilar lymphadenopathy. Thyroid gland, trachea, and esophagus demonstrate no significant findings. Lungs/Pleura: Mild-to-moderate severity emphysematous lung disease is seen involving the bilateral upper lobes with stable bullous disease is seen along the medial aspect of the left apex. Mild to moderate severity areas of atelectasis and/or infiltrate are seen within the posterior aspects of the bilateral upper lobes and left lung base. Moderate to marked severity posterior right basilar atelectasis and/or infiltrate is noted. A very small right pleural effusion is seen. No pneumothorax is identified. Upper Abdomen: There is stable hepatosplenomegaly with stable, moderate severity diffuse bilateral adrenal gland enlargement. Musculoskeletal: A chronic lateral eighth left rib fracture is seen. A chronic compression fracture deformity of the T12 vertebral body is noted. IMPRESSION: 1. Mild-to-moderate severity posterior bilateral upper lobe and left basilar atelectasis and/or infiltrate. 2. Moderate to marked severity posterior right basilar atelectasis and/or infiltrate. 3. Very small right pleural effusion. 4. Mild cardiomegaly. 5. Stable emphysematous lung disease. 6. Chronic lateral eighth left rib fracture. 7. Chronic compression fracture deformity of the T12 vertebral body. 8. Aortic atherosclerosis. Aortic Atherosclerosis (ICD10-I70.0) and Emphysema (ICD10-J43.9). Electronically Signed   By: TVirgina NorfolkM.D.   On: 04/06/2020 15:33   CT ANGIO CHEST PE W OR WO CONTRAST  Result Date: 04/07/2020 CLINICAL DATA:  Shortness of breath EXAM: CT ANGIOGRAPHY CHEST WITH CONTRAST TECHNIQUE: Multidetector CT imaging of the chest was performed using the standard protocol during bolus administration of intravenous contrast. Multiplanar CT image reconstructions and MIPs were  obtained to evaluate the vascular anatomy. CONTRAST:  741mOMNIPAQUE IOHEXOL 350 MG/ML SOLN COMPARISON:  April 06, 2020 FINDINGS: Cardiovascular: There is a optimal opacification of the pulmonary arteries. There is no central,segmental, or subsegmental filling defects within the pulmonary arteries. There is unchanged cardiomegaly. Mitral valve calcifications and coronary artery calcifications are seen. No pericardial effusion or thickening. No evidence right heart strain. There is normal three-vessel brachiocephalic anatomy without proximal stenosis. Scattered aortic atherosclerosis is noted. Mediastinum/Nodes: No hilar, mediastinal, or axillary adenopathy. Thyroid gland, trachea, and esophagus demonstrate no significant findings. Lungs/Pleura: Patchy airspace opacity seen at the left upper lung and posterior right lung base with air bronchograms. There are trace bilateral pleural effusions present. Centrilobular and paraseptal emphysematous changes seen at both lung apices. Upper Abdomen: No acute abnormalities present in the visualized portions of the upper abdomen. Musculoskeletal: No chest wall abnormality. No acute or significant osseous findings. There is a chronic anterior wedge compression deformity of the T12 vertebral body with 50% loss in vertebral body height. Review of the MIP images confirms the above findings. IMPRESSION: No central, segmental, or subsegmental pulmonary embolism. Unchanged multifocal patchy airspace opacities within both lungs which could be due to multifocal pneumonia Trace bilateral pleural effusions, right greater than left. Aortic Atherosclerosis (ICD10-I70.0). Electronically Signed   By: BiPrudencio Pair.D.   On: 04/07/2020 19:43   CT CHEST ABDOMEN PELVIS W CONTRAST  Result Date: 04/12/2020 CLINICAL DATA:  Shortness of breath, abdominal pain, and bilateral leg swelling. EXAM: CT CHEST, ABDOMEN, AND PELVIS WITH CONTRAST TECHNIQUE: Multidetector CT imaging of the chest,  abdomen and pelvis was performed following the standard protocol during bolus administration of intravenous contrast. CONTRAST:  10098mMNIPAQUE  IOHEXOL 300 MG/ML  SOLN COMPARISON:  Right upper quadrant ultrasound from yesterday. CTA chest dated April 07, 2020. FINDINGS: CT CHEST FINDINGS Cardiovascular: Unchanged borderline cardiomegaly. No pericardial effusion. No thoracic aortic aneurysm or dissection. Coronary, aortic arch, and branch vessel atherosclerotic vascular disease. 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 No central pulmonary embolism. Mediastinum/Nodes: Unchanged mildly enlarged precarinal lymph node measuring 12 mm in short axis, stable since September 2020, likely reactive. No enlarged hilar or axillary lymph nodes. Prior thyroidectomy. Prominent circumferential wall thickening of the proximal and mid esophagus, slightly worsened since the prior study. Lungs/Pleura: Slightly increased small right pleural effusion. New trace left pleural effusion. Prominent atelectasis within the right lower lobe with patchy areas of non enhancement and air bronchograms consistent with underlying pneumonia, slightly worsened when compared to prior study. Increased patchy consolidation in both posterior upper lobes. Minimal subsegmental atelectasis in the left lower lobe. Moderate centrilobular and mild paraseptal emphysema again noted with bullous changes in the medial left lung apex. No pneumothorax. 7 mm subpleural nodule in the left upper lobe (series 4, image 39), similar to most recent chest CTs, but slightly more conspicuous when compared to CT chest from September 2020. Musculoskeletal: No acute or significant osseous findings. Unchanged chronic T12 compression deformity. Old left eighth rib fracture. CT ABDOMEN PELVIS FINDINGS Hepatobiliary: Slightly lobular liver contour. No focal liver abnormality is seen. No  gallstones, gallbladder wall thickening, or biliary dilatation. Pancreas: Unremarkable. No pancreatic ductal dilatation or surrounding inflammatory changes. Spleen: Normal in size without focal abnormality. Adrenals/Urinary Tract: Chronic bilateral adrenal hypertrophy. No renal calculi, focal lesion, or hydronephrosis. The bladder is unremarkable. Stomach/Bowel: Stomach is within normal limits. Appendix appears normal. No evidence of bowel wall thickening, distention, or inflammatory changes. Prominent sigmoid colonic diverticulosis. Vascular/Lymphatic: Aortic atherosclerosis. 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 No enlarged abdominal or pelvic lymph nodes. Reproductive: Uterus and bilateral adnexa are unremarkable. Other: Scattered trace ascites. No pneumoperitoneum. Small fat containing paraumbilical hernias. Musculoskeletal: No acute or significant osseous findings. IMPRESSION: CT chest: 1. Mildly worsened multifocal pneumonia. 2. Slightly increased small right pleural effusion. New trace left pleural effusion. 3. 7 mm subpleural nodule in the left upper lobe, similar to most recent chest CTs, but slightly more conspicuous when compared to CT chest from September 2020. While this may be infectious or inflammatory, attention on follow-up imaging is recommended. 4. Prominent circumferential wall thickening of the proximal and mid esophagus, slightly worsened since the prior study, concerning for esophagitis. 5. 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 6. Aortic Atherosclerosis (ICD10-I70.0) and Emphysema (ICD10-J43.9). CT abdomen and pelvis: 1. No acute intra-abdominal process. 2. Slightly lobular liver contour with scattered trace ascites, suggestive of cirrhosis. 3. 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 Electronically Signed   By: Titus Dubin M.D.   On: 04/12/2020 13:51   US Venous  Img Lower Bilateral (DVT)  Result Date: 04/03/2020 CLINICAL DATA:  Lower leg cellulitis EXAM: BILATERAL LOWER EXTREMITY VENOUS DOPPLER ULTRASOUND TECHNIQUE: Gray-scale sonography with graded compression, as well as color Doppler and duplex ultrasound were performed to evaluate the lower extremity deep venous systems from the level of the common femoral vein and including the common femoral, femoral, profunda femoral, popliteal and calf veins including the posterior tibial, peroneal and gastrocnemius veins when visible. The superficial great saphenous vein was also interrogated. Spectral Doppler was utilized to  evaluate flow at rest and with distal augmentation maneuvers in the common femoral, femoral and popliteal veins. COMPARISON:  None. FINDINGS: RIGHT LOWER EXTREMITY Common Femoral Vein: No evidence of thrombus. Normal compressibility, respiratory phasicity and response to augmentation. Saphenofemoral Junction: No evidence of thrombus. Normal compressibility and flow on color Doppler imaging. Profunda Femoral Vein: No evidence of thrombus. Normal compressibility and flow on color Doppler imaging. Femoral Vein: No evidence of thrombus. Normal compressibility, respiratory phasicity and response to augmentation. Popliteal Vein: Not well visualized due to overlying edema. Calf Veins: Not well visualized due to overlying edema. Superficial Great Saphenous Vein: No evidence of thrombus. Normal compressibility. Venous Reflux:  None. Other Findings:  None. LEFT LOWER EXTREMITY Common Femoral Vein: No evidence of thrombus. Normal compressibility, respiratory phasicity and response to augmentation. Saphenofemoral Junction: No evidence of thrombus. Normal compressibility and flow on color Doppler imaging. Profunda Femoral Vein: No evidence of thrombus. Normal compressibility and flow on color Doppler imaging. Femoral Vein: No evidence of thrombus. Normal compressibility, respiratory phasicity and response to  augmentation. Popliteal Vein: Not well visualized due to overlying edema. Calf Veins: Not well visualized due to overlying edema. Superficial Great Saphenous Vein: No evidence of thrombus. Normal compressibility. Venous Reflux:  None. Other Findings:  None. IMPRESSION: Somewhat limited exam due to peripheral edema although no central deep venous thrombosis is noted. Electronically Signed   By: Inez Catalina M.D.   On: 04/03/2020 15:58   DG Chest Port 1 View  Result Date: 04/17/2020 CLINICAL DATA:  Respiratory failure EXAM: PORTABLE CHEST 1 VIEW COMPARISON:  April 15, 2020 FINDINGS: The cardiomediastinal silhouette is unchanged in contour.Atherosclerotic calcifications of the aorta. Small RIGHT pleural effusion. No pneumothorax. Diffuse interstitial prominence. Decreased LEFT lower lung atelectasis. Persistent heterogeneous opacity of the RIGHT lung base. Visualized abdomen is unremarkable. Multilevel degenerative changes of the thoracic spine. Remote LEFT-sided rib fracture. IMPRESSION: 1. Decreased LEFT lower lung atelectasis. Persistent heterogeneous opacity of the RIGHT lung base with diffuse interstitial prominence. 2.  Unchanged small RIGHT pleural effusion. Electronically Signed   By: Valentino Saxon MD   On: 04/17/2020 14:51   DG Chest Port 1 View  Result Date: 04/15/2020 CLINICAL DATA:  Shortness of breath with soft tissue edema EXAM: PORTABLE CHEST 1 VIEW COMPARISON:  Chest radiograph April 09, 2020 and chest CT April 12, 2020 FINDINGS: There is diffuse interstitial prominence, likely representing a combination of fibrosis and interstitial pulmonary edema. There is atelectatic change in the left mid lung. There are small pleural effusions bilaterally. No consolidation. There is cardiomegaly with a degree of pulmonary venous hypertension. There is aortic atherosclerosis. No appreciable adenopathy. Foci of calcification noted in each carotid artery. Calcification noted in the lateral left  shoulder, stable. IMPRESSION: Suspect combination of interstitial pulmonary edema and underlying fibrosis. No consolidation. Small pleural effusions bilaterally. Cardiomegaly with pulmonary vascular congestion. Suspect a degree of underlying congestive heart failure. Aortic Atherosclerosis (ICD10-I70.0). Electronically Signed   By: Lowella Grip III M.D.   On: 04/15/2020 09:01   DG Swallowing Func-Speech Pathology  Result Date: 04/19/2020 Objective Swallowing Evaluation: Type of Study: MBS-Modified Barium Swallow Study  Patient Details Name: KYMBERLEY RAZ MRN: [id number] Date of Birth: 1946-03-17 Today's Date: 04/19/2020 Time: SLP Start Time (ACUTE ONLY): 0805 -SLP Stop Time (ACUTE ONLY): 0825 SLP Time Calculation (min) (ACUTE ONLY): 20 min Past Medical History: Past Medical History: Diagnosis Date . Abnormal LFTs (liver function tests)  . Anxiety  . Arthritis   knees, feet . Asthma  . Chronic  bronchitis ([place])  . Chronic constipation  . Chronic low back pain  . Cigarette smoker   Has cut back Smoking to 1 pack every other day . COPD (chronic obstructive pulmonary disease) ([place])  . Depression  . Diabetic neuropathy ([place])  . Diverticulitis 2015  gi recommended repeat scope in 10 years . Essential hypertension  . Fatty liver  . GERD (gastroesophageal reflux disease)  . Gout  . History of mammogram 05/28/2013 . Hyperlipidemia  . Hypothyroidism  . Insomnia  . Iron deficiency anemia  . Low serum vitamin D  . Personal history of tobacco use, presenting hazards to health 09/29/2015 . Plantar fasciitis  . RLS (restless legs syndrome)  . Shortness of breath dyspnea  . Thyroid cancer ([place]) 2009 . Tremors of nervous system   "I think I have parkinson's disease" . Type 2 diabetes mellitus ([place])  . Wears dentures   uppers Past Surgical History: Past Surgical History: Procedure Laterality Date . CATARACT EXTRACTION W/PHACO Left 11/24/2015  Procedure: CATARACT EXTRACTION PHACO AND INTRAOCULAR LENS PLACEMENT (IOC);  Surgeon:  Birder Robson, MD;  Location: [facility] ORS;  Service: Ophthalmology;  Laterality: Left;  [place] 38.3 AP% 20.6 CDE 7.89 FLUID PACK LOT # [id number] H . CATARACT EXTRACTION W/PHACO Right 06/23/2019  Procedure: CATARACT EXTRACTION PHACO AND INTRAOCULAR LENS PLACEMENT ([place]) RIGHT DIABETIC;  Surgeon: Birder Robson, MD;  Location: [place];  Service: Ophthalmology;  Laterality: Right;  Diabetic . COLONOSCOPY  2004, 2009, 2015  [place] . POLYPECTOMY  2009 . THYROIDECTOMY  2009  states she was tx with radiactive iodine . TUBAL LIGATION   . UPPER GASTROINTESTINAL ENDOSCOPY   HPI: Pt  is a 74 y.o. female with a medical history significant for Multiple issues including GERD, restless leg syndrome, essential tremors, HTN, DM, COPD, tobacco use, gout, depression/anxiety and chronic lower extremity edema/cellulitis.  Patient presented [facility] ED [facility]/20 for leg edema and worsening shortness of breath and was found with O2 saturations in the 80's. She was found to have a BNP of 1113 and imaging showed mild cardiomegaly and pulmonary edema. Her lab work also showed worsening proteinuria. She was admitted for further work up.  per Pulmonary, pt has dx of Acute on chronic hypoxemic respiratory failure; BiPAP at night, on increased [place] O2 support during the day.  Pt has some degree of Cognitive decline - decreased attention and follow through w/ tasks w/out cues; often calling out Loudly.  CXR: Unchanged multifocal patchy airspace opacities within both lungs  which could be due to multifocal pneumonia.  Subjective: Bloody sputum on pt's chin and inside her mask when she arrived Assessment / Plan / Recommendation CHL IP CLINICAL IMPRESSIONS 04/19/2020 Clinical Impression Patient presents with moderately severe oropharyngeal dysphagia, with trace silent aspiration of thin and nectar thick liquids and laryngeal penetration of honey thick liquids and puree. When pt arrived to fluoro, she had coughed bloody-tinged secretions into  her mask. Oral stage is characterized by sluggish, discoordinated bolus formation, decreased bolus cohesion with premature spillage to the pharynx. Swallow initiation is delayed to the level of the pyriform sinuses, resulting in delayed laryngeal closure and penetration of all consistencies before the swallow, aspiration of thin and nectar thick liquids during the swallow which were not sensed by the patient. Penetration/aspiration appears to occur due to impaired timing/sensation vs pharyngeal weakness. She is only intermittently able to follow commands for throat clear/cough, which was marginally effective at clearing shallower penetration. There is intermittent mild residue in the oral cavity, with spillover  occurring to the valleculae between frames. A chin tuck was attempted, with max cues required; this did not prevent penetration or aspiration (with nectar). Smaller (1/2-1 tsp) size boluses of thicker consistencies were better contained in the valleculae, reducing potential for aspiration from premature spill.  Recommend dys 1 (puree) and honey thick liquids by teaspoon, with assist for self-feeding and cuing for throat clearing intermittently to eject laryngeal penetration. Patient is at risk for aspiration regardless of consistency, although there may be some potential for improvement if her mental status improves. SLP Visit Diagnosis Dysphagia, oropharyngeal phase (R13.12) Attention and concentration deficit following -- Frontal lobe and executive function deficit following -- Impact on safety and function Moderate aspiration risk;Severe aspiration risk   CHL IP TREATMENT RECOMMENDATION 04/19/2020 Treatment Recommendations Therapy as outlined in treatment plan below   Prognosis 04/19/2020 Prognosis for Safe Diet Advancement Fair Barriers to Reach Goals Time post onset;Severity of deficits;Behavior Barriers/Prognosis Comment -- CHL IP DIET RECOMMENDATION 04/19/2020 SLP Diet Recommendations Dysphagia 1  (Puree) solids;Honey thick liquids Liquid Administration via Spoon Medication Administration Crushed with puree Compensations Minimize environmental distractions;Slow rate;Small sips/bites;Lingual sweep for clearance of pocketing;Clear throat intermittently Postural Changes Seated upright at 90 degrees   CHL IP OTHER RECOMMENDATIONS 04/19/2020 Recommended Consults -- Oral Care Recommendations Oral care QID Other Recommendations Order thickener from pharmacy;Prohibited food (jello, ice cream, thin soups)   CHL IP FOLLOW UP RECOMMENDATIONS 04/19/2020 Follow up Recommendations Other (comment)   CHL IP FREQUENCY AND DURATION 04/19/2020 Speech Therapy Frequency (ACUTE ONLY) min 2x/week Treatment Duration 1 week      CHL IP ORAL PHASE 04/19/2020 Oral Phase Impaired Oral - Pudding Teaspoon -- Oral - Pudding Cup -- Oral - Honey Teaspoon Lingual/palatal residue;Decreased bolus cohesion;Premature spillage Oral - Honey Cup NT Oral - Nectar Teaspoon Lingual/palatal residue;Decreased bolus cohesion;Premature spillage Oral - Nectar Cup Lingual/palatal residue;Decreased bolus cohesion;Premature spillage Oral - Nectar Straw NT Oral - Thin Teaspoon Lingual/palatal residue;Decreased bolus cohesion;Premature spillage Oral - Thin Cup Lingual/palatal residue;Decreased bolus cohesion;Premature spillage Oral - Thin Straw NT Oral - Puree Lingual/palatal residue;Decreased bolus cohesion;Premature spillage Oral - Mech Soft -- Oral - Regular -- Oral - Multi-Consistency -- Oral - Pill -- Oral Phase - Comment --  CHL IP PHARYNGEAL PHASE 04/19/2020 Pharyngeal Phase Impaired Pharyngeal- Pudding Teaspoon -- Pharyngeal -- Pharyngeal- Pudding Cup -- Pharyngeal -- Pharyngeal- Honey Teaspoon Delayed swallow initiation-pyriform sinuses;Penetration/Aspiration before swallow Pharyngeal Material enters airway, remains ABOVE vocal cords then ejected out;Material enters airway, remains ABOVE vocal cords and not ejected out Pharyngeal- Honey Cup NT  Pharyngeal -- Pharyngeal- Nectar Teaspoon Delayed swallow initiation-pyriform sinuses;Penetration/Aspiration before swallow;Penetration/Aspiration during swallow;Trace aspiration;Compensatory strategies attempted (with notebox) Pharyngeal Material enters airway, remains ABOVE vocal cords and not ejected out;Material enters airway, passes BELOW cords without attempt by patient to eject out (silent aspiration) Pharyngeal- Nectar Cup Delayed swallow initiation-pyriform sinuses;Penetration/Aspiration before swallow;Penetration/Aspiration during swallow;Trace aspiration Pharyngeal Material enters airway, remains ABOVE vocal cords and not ejected out;Material enters airway, passes BELOW cords without attempt by patient to eject out (silent aspiration) Pharyngeal- Nectar Straw NT Pharyngeal -- Pharyngeal- Thin Teaspoon Delayed swallow initiation-pyriform sinuses;Penetration/Aspiration before swallow Pharyngeal Material enters airway, remains ABOVE vocal cords and not ejected out Pharyngeal- Thin Cup Delayed swallow initiation-pyriform sinuses;Penetration/Aspiration before swallow;Penetration/Aspiration during swallow;Trace aspiration Pharyngeal Material enters airway, remains ABOVE vocal cords and not ejected out;Material enters airway, passes BELOW cords without attempt by patient to eject out (silent aspiration) Pharyngeal- Thin Straw NT Pharyngeal -- Pharyngeal- Puree Delayed swallow initiation-pyriform sinuses;Penetration/Aspiration before swallow Pharyngeal Material enters airway, remains  ABOVE vocal cords then ejected out;Material enters airway, remains ABOVE vocal cords and not ejected out Pharyngeal- Mechanical Soft -- Pharyngeal -- Pharyngeal- Regular -- Pharyngeal -- Pharyngeal- Multi-consistency -- Pharyngeal -- Pharyngeal- Pill -- Pharyngeal -- Pharyngeal Comment --  CHL IP CERVICAL ESOPHAGEAL PHASE 04/19/2020 Cervical Esophageal Phase WFL Pudding Teaspoon -- Pudding Cup -- Honey Teaspoon -- Honey Cup -- Nectar  Teaspoon -- Nectar Cup -- Nectar Straw -- Thin Teaspoon -- Thin Cup -- Thin Straw -- Puree -- Mechanical Soft -- Regular -- Multi-consistency -- Pill -- Cervical Esophageal Comment -- Deneise Lever, MS, CCC-SLP Speech-Language Pathologist Martha [name] 04/19/2020, 12:17 PM              ECHOCARDIOGRAM COMPLETE  Result Date: 04/04/2020    ECHOCARDIOGRAM REPORT   Patient Name:   Martha [name] Date of Exam: 04/04/2020 Medical Rec #:  [id number]      Height:       64.0 in Accession #:    [id number]     Weight:       181.8 lb Date of Birth:  07-27-1945     BSA:          1.879 m Patient Age:    76 years       BP:           171/78 mmHg Patient Gender: F              HR:           98 bpm. Exam Location:  [facility] Procedure: 2D Echo, Color Doppler, Cardiac Doppler and Intracardiac            Opacification Agent Indications:     I50.31 CHF-Acute Diastolic  History:         Patient has no prior history of Echocardiogram examinations.                  COPD, Signs/Symptoms:Shortness of Breath; Risk Factors:Diabetes                  and Current Smoker.  Sonographer:     Charmayne Sheer RDCS (AE) Referring Phys:  Baker Janus Soledad Gerlach NIU Diagnosing Phys: Bartholome Bill MD  Sonographer Comments: Technically difficult study due to poor echo windows. Image acquisition challenging due to patient body habitus and Image acquisition challenging due to COPD. IMPRESSIONS  1. Left ventricular ejection fraction, by estimation, is 55 to 60%. The left ventricle has normal function. The left ventricle has no regional wall motion abnormalities. Left ventricular diastolic parameters are consistent with Grade I diastolic dysfunction (impaired relaxation).  2. Right ventricular systolic function is normal. The right ventricular size is mildly enlarged.  3. Left atrial size was mildly dilated.  4. Right atrial size was mildly dilated.  5. The mitral valve was not well visualized. Trivial mitral valve regurgitation.  6. The aortic valve was not well visualized.  Aortic valve regurgitation is trivial. FINDINGS  Left Ventricle: Left ventricular ejection fraction, by estimation, is 55 to 60%. The left ventricle has normal function. The left ventricle has no regional wall motion abnormalities. Definity contrast agent was given IV to delineate the left ventricular  endocardial borders. The left ventricular internal cavity size was normal in size. There is borderline left ventricular hypertrophy. Left ventricular diastolic parameters are consistent with Grade I diastolic dysfunction (impaired relaxation). Right Ventricle: The right ventricular size is mildly enlarged. No increase in right ventricular wall thickness. Right ventricular systolic function is normal. Left Atrium: Left  atrial size was mildly dilated. Right Atrium: Right atrial size was mildly dilated. Pericardium: There is no evidence of pericardial effusion. Mitral Valve: The mitral valve was not well visualized. Trivial mitral valve regurgitation. MV peak gradient, 7.4 mmHg. The mean mitral valve gradient is 3.0 mmHg. Tricuspid Valve: The tricuspid valve is not well visualized. Tricuspid valve regurgitation is trivial. Aortic Valve: The aortic valve was not well visualized. Aortic valve regurgitation is trivial. Aortic valve mean gradient measures 3.0 mmHg. Aortic valve peak gradient measures 6.8 mmHg. Aortic valve area, by VTI measures 2.46 cm. Pulmonic Valve: The pulmonic valve was not well visualized. Pulmonic valve regurgitation is not visualized. Aorta: The aortic root was not well visualized. IAS/Shunts: The interatrial septum was not assessed.  LEFT VENTRICLE PLAX 2D LVIDd:         4.15 cm     Diastology LVIDs:         3.64 cm     LV e' medial:    5.98 cm/s LV PW:         1.43 cm     LV E/e' medial:  18.3 LV IVS:        1.12 cm     LV e' lateral:   6.53 cm/s LVOT diam:     2.00 cm     LV E/e' lateral: 16.8 LV SV:         55 LV SV Index:   29 LVOT Area:     3.14 cm  LV Volumes (MOD) LV vol d, MOD A2C: 55.5 ml  LV vol d, MOD A4C: 63.8 ml LV vol s, MOD A2C: 34.3 ml LV vol s, MOD A4C: 23.4 ml LV SV MOD A2C:     21.2 ml LV SV MOD A4C:     63.8 ml LV SV MOD BP:      34.2 ml RIGHT VENTRICLE RV Basal diam:  3.76 cm LEFT ATRIUM             Index       RIGHT ATRIUM           Index LA diam:        4.90 cm 2.61 cm/m  RA Area:     19.80 cm LA Vol (A2C):   59.5 ml 31.67 ml/m RA Volume:   61.10 ml  32.52 ml/m LA Vol (A4C):   57.5 ml 30.61 ml/m LA Biplane Vol: 61.2 ml 32.58 ml/m  AORTIC VALVE                   PULMONIC VALVE AV Area (Vmax):    2.51 cm    PV Vmax:       0.76 m/s AV Area (Vmean):   2.43 cm    PV Vmean:      53.200 cm/s AV Area (VTI):     2.46 cm    PV VTI:        0.126 m AV Vmax:           130.00 cm/s PV Peak grad:  2.3 mmHg AV Vmean:          83.500 cm/s PV Mean grad:  1.0 mmHg AV VTI:            0.225 m AV Peak Grad:      6.8 mmHg AV Mean Grad:      3.0 mmHg LVOT Vmax:         104.00 cm/s LVOT Vmean:        64.700 cm/s  LVOT VTI:          0.176 m LVOT/AV VTI ratio: 0.78  AORTA Ao Root diam: 3.10 cm MITRAL VALVE                TRICUSPID VALVE MV Area (PHT): 3.00 cm     TR Peak grad:   28.9 mmHg MV Peak grad:  7.4 mmHg     TR Vmax:        269.00 cm/s MV Mean grad:  3.0 mmHg MV Vmax:       1.36 m/s     SHUNTS MV Vmean:      86.9 cm/s    Systemic VTI:  0.18 m MV Decel Time: 253 msec     Systemic Diam: 2.00 cm MV E velocity: 109.50 cm/s MV A velocity: 137.50 cm/s MV E/A ratio:  0.80 Bartholome Bill MD Electronically signed by Bartholome Bill MD Signature Date/Time: 04/04/2020/4:54:02 PM    Final    US Abdomen Limited RUQ (LIVER/GB)  Result Date: 04/11/2020 CLINICAL DATA:  Hyperbilirubinemia in a 74 year old female EXAM: ULTRASOUND ABDOMEN LIMITED RIGHT UPPER QUADRANT COMPARISON:  April 07, 2020 CT angiography of the chest and CT of the chest of April 06, 2020 FINDINGS: Gallbladder: Wall thickness at upper limits of normal. No visible pericholecystic fluid or evidence of cholelithiasis. No reported tenderness over  the gallbladder. Common bile duct: Diameter: 2.2 mm Liver: Mildly heterogeneous echotexture with nodular contour and suggestion of fissural widening particularly on image 22. No visible lesion on submitted images. Portal vein is patent on color Doppler imaging with normal direction of blood flow towards the liver. Other: Trace ascites IMPRESSION: 1. Heterogeneous, mildly heterogeneous hepatic echotexture with question of lobular contour and fissural widening, findings could be seen in the setting of early liver disease. 2. Gallbladder wall thickness at upper limits of normal could be seen in the setting of liver disease and is nonspecific, not associated with biliary calculi, reported tenderness or biliary duct dilation. Electronically Signed   By: Zetta Bills M.D.   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  -12/15- DCD lasix , DCD solumedrol , started PO prednisone taper at 45m po daily  04/14/20- patient on bubbler 8L.  04/14/20- patient weaned to [place] 04/17/20- weaned to 5L/min, mentation is lucid GCS 10.  Discussed chest impaging with daughter. Prednisone tapering to 30 mg 04/17/20.  Conitnue with PT 04/18/20-  Increased O2 req, patietnt is 16L net negative now. Continue PT.OT/chest PT for atelectasis 12/21-22 weaned to 6L.  Patient asking for comfort measures.  Palliative care evaluation in process.    Altered mental status with confusion-improved  -severe hypercapnic 3encephalopathy -Meds have  been adjusted and mentation is improved -will obtain ABG to evaluate hypercanic encephalopathy -continue on BIPAP -[facility] - BIPAP qualifiacation - spirometry in process s/p ABG       Thank you for allowing me to participate in the care of this patient.    Patient/Family are satisfied with care plan and all questions have been  answered.   This document was prepared using Dragon voice recognition software and may include unintentional dictation errors.     Ottie Glazier, M.D.  Division of [place]

## 2020-04-20 NOTE — Progress Notes (Signed)
Patient Name: Martha [name] Date of Encounter: 04/20/2020  Hospital Problem List     Principal Problem:   Acute CHF (congestive heart failure) (HCC) Active Problems:   COPD exacerbation (HCC)   Diabetes mellitus without complication ([place])   Current tobacco use   Hyperlipidemia   Essential hypertension   Hypothyroidism   Gout   Depression   Tremors of nervous system   Acute respiratory failure with hypoxia (HCC)   Cellulitis of lower extremity   Acute pulmonary edema (HCC)   Elevated brain natriuretic peptide (BNP) level   Nephrotic syndrome   Malnutrition of moderate degree    Patient Profile      74 year old with history of hypertension,hyperlipidemia,diabetes,COPD,smoking, thyroid disease,depression,tremor, andGERD presents with bilateral leg edema swelling redness pain. Patientadmitted withcellulitisor lower extremities. She washaving wound care done to her legs at home. She wasseen by vascular in the past. Shepresentedwith severe hypoxemia COPD on BiPAP. She has a long history of smoking. She improved with diuresis and treatment of her reactive airway disease. She has required resumption of bipap several days ago and was placed back on steroids and abx for possible aspiration pna. BNP was 1113 on admission and iimproved to 674 on 12/19. . CXR showed cardiomegaly with pulmonary vascular congestion and chf on admissioin.CXR on 12/19 revealed small right pleural effusion. K 3.1 and creatinine stable at .41  Subjective     Inpatient Medications    . amLODipine  10 mg Oral Daily  . vitamin C  1,000 mg Oral Daily  . carvedilol  12.5 mg Oral BID WC  . chlorhexidine  15 mL Mouth Rinse BID  . Chlorhexidine Gluconate Cloth  6 each Topical Daily  . enoxaparin (LOVENOX) injection  40 mg Subcutaneous Q24H  . fluticasone  1 spray Each Nare BID  . insulin aspart  0-5 Units Subcutaneous QHS  . insulin aspart  0-9 Units Subcutaneous TID WC  . insulin glargine   20 Units Subcutaneous Daily  . ipratropium-albuterol  3 mL Nebulization TID  . levothyroxine  150 mcg Oral Q0600  . losartan  100 mg Oral Daily  . mouth rinse  15 mL Mouth Rinse q12n4p  . multivitamin with minerals  1 tablet Oral Daily  . pantoprazole  40 mg Oral Daily  . predniSONE  20 mg Oral Q breakfast  . primidone  50 mg Oral BID  . rosuvastatin  5 mg Oral Daily  . sodium chloride flush  3 mL Intravenous Q12H  . vitamin B-12  1,000 mcg Oral Daily    Vital Signs    Vitals:   04/19/20 1945 04/20/20 0400 04/20/20 0734 04/20/20 0809  BP:  (!) 147/47  (!) 131/117  Pulse:  (!) 57  60  Resp:  18  18  Temp:  97.8 F (36.6 C)  97.9 F (36.6 C)  TempSrc:      SpO2: 92% (!) 88% (!) 88% 90%  Weight:  66.6 kg    Height:        Intake/Output Summary (Last 24 hours) at 04/20/2020 0809 Last data filed at 04/19/2020 2238 Gross per 24 hour  Intake 358 ml  Output 625 ml  Net -267 ml   Filed Weights   04/17/20 0340 04/19/20 0414 04/20/20 0400  Weight: 67 kg 66.8 kg 66.6 kg    Physical Exam     Labs    CBC Recent Labs    04/19/20 0544  WBC 8.6  HGB 13.3  HCT 40.5  MCV 86.0  PLT [id number]   Basic Metabolic Panel Recent Labs    04/19/20 0544 04/20/20 0419  NA 133*  --   K 3.1*  --   CL 94*  --   CO2 31  --   GLUCOSE 79  --   BUN 20  --   CREATININE 0.41*  --   CALCIUM 8.1*  --   MG 2.0 2.0   Liver Function Tests No results for input(s): AST, ALT, ALKPHOS, BILITOT, PROT, ALBUMIN in the last 72 hours. No results for input(s): LIPASE, AMYLASE in the last 72 hours. Cardiac Enzymes No results for input(s): CKTOTAL, CKMB, CKMBINDEX, TROPONINI in the last 72 hours. BNP No results for input(s): BNP in the last 72 hours. D-Dimer No results for input(s): DDIMER in the last 72 hours. Hemoglobin A1C No results for input(s): HGBA1C in the last 72 hours. Fasting Lipid Panel No results for input(s): CHOL, HDL, LDLCALC, TRIG, CHOLHDL, LDLDIRECT in the last 72  hours. Thyroid Function Tests No results for input(s): TSH, T4TOTAL, T3FREE, THYROIDAB in the last 72 hours.  Invalid input(s): FREET3  Telemetry    nsr  ECG    sr with no ischemia  Radiology    DG Chest 1 View  Result Date: 04/09/2020 CLINICAL DATA:  Acute respiratory failure EXAM: CHEST  1 VIEW COMPARISON:  04/03/2016 FINDINGS: Normal cardiac silhouette. Interval increase in interstitial edema pattern. No focal consolidation. No pneumothorax. IMPRESSION: Worsening interstitial edema. Electronically Signed   By: Suzy Bouchard M.D.   On: 04/09/2020 06:31   DG Chest 2 View  Result Date: 04/03/2020 CLINICAL DATA:  Hypoxia. Leg edema and shortness of breath. Pt daughter states that pt is supposed to be getting a renal bx but they are not able to do it because of the edema in pts legs. Pt states that she does feel short of breath. Pt does not wear oxygen at home. Pt placed on O2 in triage due to sats being in the 80s. Hx of asthma, chronic bronchitis, COPD, thyroid cancer- 2009, DM, current smoker. EXAM: CHEST - 2 VIEW COMPARISON:  01/04/2018 FINDINGS: Cardiac silhouette is borderline enlarged. Stable aortic atherosclerotic calcifications. No mediastinal or hilar masses. No evidence of adenopathy. Lungs demonstrate diffuse bilateral irregular interstitial thickening which has increased compared to the prior study. There is also linear/reticular scarring in the left lung base, which is stable. No lung consolidation. No pleural effusion and no pneumothorax. Skeletal structures are demineralized, but grossly intact. IMPRESSION: 1. Bilateral irregular interstitial thickening, increased compared to the prior study, along with borderline cardiomegaly. Suspect mild congestive heart failure. Consider diffuse interstitial infection or inflammation in the proper clinical setting. No evidence of lobar pneumonia. Electronically Signed   By: Lajean Manes M.D.   On: 04/03/2020 13:06   CT CHEST W  CONTRAST  Result Date: 04/06/2020 CLINICAL DATA:  Hemoptysis. EXAM: CT CHEST WITH CONTRAST TECHNIQUE: Multidetector CT imaging of the chest was performed during intravenous contrast administration. CONTRAST:  89mL OMNIPAQUE IOHEXOL 300 MG/ML  SOLN COMPARISON:  January 06, 2019 FINDINGS: Cardiovascular: There is marked severity calcification of the aortic arch. There is mild cardiomegaly. No pericardial effusion. Mediastinum/Nodes: There is mild pretracheal and right hilar lymphadenopathy. Thyroid gland, trachea, and esophagus demonstrate no significant findings. Lungs/Pleura: Mild-to-moderate severity emphysematous lung disease is seen involving the bilateral upper lobes with stable bullous disease is seen along the medial aspect of the left apex. Mild to moderate severity areas of atelectasis and/or infiltrate are seen within the posterior aspects of the  bilateral upper lobes and left lung base. Moderate to marked severity posterior right basilar atelectasis and/or infiltrate is noted. A very small right pleural effusion is seen. No pneumothorax is identified. Upper Abdomen: There is stable hepatosplenomegaly with stable, moderate severity diffuse bilateral adrenal gland enlargement. Musculoskeletal: A chronic lateral eighth left rib fracture is seen. A chronic compression fracture deformity of the T12 vertebral body is noted. IMPRESSION: 1. Mild-to-moderate severity posterior bilateral upper lobe and left basilar atelectasis and/or infiltrate. 2. Moderate to marked severity posterior right basilar atelectasis and/or infiltrate. 3. Very small right pleural effusion. 4. Mild cardiomegaly. 5. Stable emphysematous lung disease. 6. Chronic lateral eighth left rib fracture. 7. Chronic compression fracture deformity of the T12 vertebral body. 8. Aortic atherosclerosis. Aortic Atherosclerosis (ICD10-I70.0) and Emphysema (ICD10-J43.9). Electronically Signed   By: Virgina Norfolk M.D.   On: 04/06/2020 15:33   CT  ANGIO CHEST PE W OR WO CONTRAST  Result Date: 04/07/2020 CLINICAL DATA:  Shortness of breath EXAM: CT ANGIOGRAPHY CHEST WITH CONTRAST TECHNIQUE: Multidetector CT imaging of the chest was performed using the standard protocol during bolus administration of intravenous contrast. Multiplanar CT image reconstructions and MIPs were obtained to evaluate the vascular anatomy. CONTRAST:  53mL OMNIPAQUE IOHEXOL 350 MG/ML SOLN COMPARISON:  April 06, 2020 FINDINGS: Cardiovascular: There is a optimal opacification of the pulmonary arteries. There is no central,segmental, or subsegmental filling defects within the pulmonary arteries. There is unchanged cardiomegaly. Mitral valve calcifications and coronary artery calcifications are seen. No pericardial effusion or thickening. No evidence right heart strain. There is normal three-vessel brachiocephalic anatomy without proximal stenosis. Scattered aortic atherosclerosis is noted. Mediastinum/Nodes: No hilar, mediastinal, or axillary adenopathy. Thyroid gland, trachea, and esophagus demonstrate no significant findings. Lungs/Pleura: Patchy airspace opacity seen at the left upper lung and posterior right lung base with air bronchograms. There are trace bilateral pleural effusions present. Centrilobular and paraseptal emphysematous changes seen at both lung apices. Upper Abdomen: No acute abnormalities present in the visualized portions of the upper abdomen. Musculoskeletal: No chest wall abnormality. No acute or significant osseous findings. There is a chronic anterior wedge compression deformity of the T12 vertebral body with 50% loss in vertebral body height. Review of the MIP images confirms the above findings. IMPRESSION: No central, segmental, or subsegmental pulmonary embolism. Unchanged multifocal patchy airspace opacities within both lungs which could be due to multifocal pneumonia Trace bilateral pleural effusions, right greater than left. Aortic Atherosclerosis  (ICD10-I70.0). Electronically Signed   By: Prudencio Pair M.D.   On: 04/07/2020 19:43   CT CHEST ABDOMEN PELVIS W CONTRAST  Result Date: 04/12/2020 CLINICAL DATA:  Shortness of breath, abdominal pain, and bilateral leg swelling. EXAM: CT CHEST, ABDOMEN, AND PELVIS WITH CONTRAST TECHNIQUE: Multidetector CT imaging of the chest, abdomen and pelvis was performed following the standard protocol during bolus administration of intravenous contrast. CONTRAST:  143mL OMNIPAQUE IOHEXOL 300 MG/ML  SOLN COMPARISON:  Right upper quadrant ultrasound from yesterday. CTA chest dated April 07, 2020. FINDINGS: CT CHEST FINDINGS Cardiovascular: Unchanged borderline cardiomegaly. No pericardial effusion. No thoracic aortic aneurysm or dissection. Coronary, aortic arch, and branch vessel atherosclerotic vascular disease. 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 No central pulmonary embolism. Mediastinum/Nodes: Unchanged mildly enlarged precarinal lymph node measuring 12 mm in short axis, stable since September 2020, likely reactive. No enlarged hilar or axillary lymph nodes. Prior thyroidectomy. Prominent circumferential wall thickening of the proximal and mid esophagus, slightly worsened since the prior  study. Lungs/Pleura: Slightly increased small right pleural effusion. New trace left pleural effusion. Prominent atelectasis within the right lower lobe with patchy areas of non enhancement and air bronchograms consistent with underlying pneumonia, slightly worsened when compared to prior study. Increased patchy consolidation in both posterior upper lobes. Minimal subsegmental atelectasis in the left lower lobe. Moderate centrilobular and mild paraseptal emphysema again noted with bullous changes in the medial left lung apex. No pneumothorax. 7 mm subpleural nodule in the left upper lobe (series 4, image 39), similar to most recent  chest CTs, but slightly more conspicuous when compared to CT chest from September 2020. Musculoskeletal: No acute or significant osseous findings. Unchanged chronic T12 compression deformity. Old left eighth rib fracture. CT ABDOMEN PELVIS FINDINGS Hepatobiliary: Slightly lobular liver contour. No focal liver abnormality is seen. No gallstones, gallbladder wall thickening, or biliary dilatation. Pancreas: Unremarkable. No pancreatic ductal dilatation or surrounding inflammatory changes. Spleen: Normal in size without focal abnormality. Adrenals/Urinary Tract: Chronic bilateral adrenal hypertrophy. No renal calculi, focal lesion, or hydronephrosis. The bladder is unremarkable. Stomach/Bowel: Stomach is within normal limits. Appendix appears normal. No evidence of bowel wall thickening, distention, or inflammatory changes. Prominent sigmoid colonic diverticulosis. Vascular/Lymphatic: Aortic atherosclerosis. 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 No enlarged abdominal or pelvic lymph nodes. Reproductive: Uterus and bilateral adnexa are unremarkable. Other: Scattered trace ascites. No pneumoperitoneum. Small fat containing paraumbilical hernias. Musculoskeletal: No acute or significant osseous findings. IMPRESSION: CT chest: 1. Mildly worsened multifocal pneumonia. 2. Slightly increased small right pleural effusion. New trace left pleural effusion. 3. 7 mm subpleural nodule in the left upper lobe, similar to most recent chest CTs, but slightly more conspicuous when compared to CT chest from September 2020. While this may be infectious or inflammatory, attention on follow-up imaging is recommended. 4. Prominent circumferential wall thickening of the proximal and mid esophagus, slightly worsened since the prior study, concerning for esophagitis. 5. 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 6. Aortic Atherosclerosis  (ICD10-I70.0) and Emphysema (ICD10-J43.9). CT abdomen and pelvis: 1. No acute intra-abdominal process. 2. Slightly lobular liver contour with scattered trace ascites, suggestive of cirrhosis. 3. 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 Electronically Signed   By: Titus Dubin M.D.   On: 04/12/2020 13:51   US Venous Img Lower Bilateral (DVT)  Result Date: 04/03/2020 CLINICAL DATA:  Lower leg cellulitis EXAM: BILATERAL LOWER EXTREMITY VENOUS DOPPLER ULTRASOUND TECHNIQUE: Gray-scale sonography with graded compression, as well as color Doppler and duplex ultrasound were performed to evaluate the lower extremity deep venous systems from the level of the common femoral vein and including the common femoral, femoral, profunda femoral, popliteal and calf veins including the posterior tibial, peroneal and gastrocnemius veins when visible. The superficial great saphenous vein was also interrogated. Spectral Doppler was utilized to evaluate flow at rest and with distal augmentation maneuvers in the common femoral, femoral and popliteal veins. COMPARISON:  None. FINDINGS: RIGHT LOWER EXTREMITY Common Femoral Vein: No evidence of thrombus. Normal compressibility, respiratory phasicity and response to augmentation. Saphenofemoral Junction: No evidence of thrombus. Normal compressibility and flow on color Doppler imaging. Profunda Femoral Vein: No evidence of thrombus. Normal compressibility and flow on color Doppler imaging. Femoral Vein: No evidence of thrombus. Normal compressibility, respiratory phasicity and response to augmentation. Popliteal Vein: Not well visualized due to overlying edema. Calf Veins: Not well visualized due to overlying edema. Superficial Great Saphenous Vein: No evidence of thrombus. Normal  compressibility. Venous Reflux:  None. Other Findings:  None. LEFT LOWER EXTREMITY Common Femoral Vein: No evidence of thrombus. Normal compressibility, respiratory phasicity and response to  augmentation. Saphenofemoral Junction: No evidence of thrombus. Normal compressibility and flow on color Doppler imaging. Profunda Femoral Vein: No evidence of thrombus. Normal compressibility and flow on color Doppler imaging. Femoral Vein: No evidence of thrombus. Normal compressibility, respiratory phasicity and response to augmentation. Popliteal Vein: Not well visualized due to overlying edema. Calf Veins: Not well visualized due to overlying edema. Superficial Great Saphenous Vein: No evidence of thrombus. Normal compressibility. Venous Reflux:  None. Other Findings:  None. IMPRESSION: Somewhat limited exam due to peripheral edema although no central deep venous thrombosis is noted. Electronically Signed   By: Inez Catalina M.D.   On: 04/03/2020 15:58   DG Chest Port 1 View  Result Date: 04/17/2020 CLINICAL DATA:  Respiratory failure EXAM: PORTABLE CHEST 1 VIEW COMPARISON:  April 15, 2020 FINDINGS: The cardiomediastinal silhouette is unchanged in contour.Atherosclerotic calcifications of the aorta. Small RIGHT pleural effusion. No pneumothorax. Diffuse interstitial prominence. Decreased LEFT lower lung atelectasis. Persistent heterogeneous opacity of the RIGHT lung base. Visualized abdomen is unremarkable. Multilevel degenerative changes of the thoracic spine. Remote LEFT-sided rib fracture. IMPRESSION: 1. Decreased LEFT lower lung atelectasis. Persistent heterogeneous opacity of the RIGHT lung base with diffuse interstitial prominence. 2.  Unchanged small RIGHT pleural effusion. Electronically Signed   By: Valentino Saxon MD   On: 04/17/2020 14:51   DG Chest Port 1 View  Result Date: 04/15/2020 CLINICAL DATA:  Shortness of breath with soft tissue edema EXAM: PORTABLE CHEST 1 VIEW COMPARISON:  Chest radiograph April 09, 2020 and chest CT April 12, 2020 FINDINGS: There is diffuse interstitial prominence, likely representing a combination of fibrosis and interstitial pulmonary edema. There  is atelectatic change in the left mid lung. There are small pleural effusions bilaterally. No consolidation. There is cardiomegaly with a degree of pulmonary venous hypertension. There is aortic atherosclerosis. No appreciable adenopathy. Foci of calcification noted in each carotid artery. Calcification noted in the lateral left shoulder, stable. IMPRESSION: Suspect combination of interstitial pulmonary edema and underlying fibrosis. No consolidation. Small pleural effusions bilaterally. Cardiomegaly with pulmonary vascular congestion. Suspect a degree of underlying congestive heart failure. Aortic Atherosclerosis (ICD10-I70.0). Electronically Signed   By: Lowella Grip III M.D.   On: 04/15/2020 09:01   DG Swallowing Func-Speech Pathology  Result Date: 04/19/2020 Objective Swallowing Evaluation: Type of Study: MBS-Modified Barium Swallow Study  Patient Details Name: ROSSLYN VILLENA MRN: [id number] Date of Birth: [birth year] Today's Date: 04/19/2020 Time: SLP Start Time (ACUTE ONLY): 0805 -SLP Stop Time (ACUTE ONLY): 0825 SLP Time Calculation (min) (ACUTE ONLY): 20 min Past Medical History: Past Medical History: Diagnosis Date . Abnormal LFTs (liver function tests)  . Anxiety  . Arthritis   knees, feet . Asthma  . Chronic bronchitis ([place])  . Chronic constipation  . Chronic low back pain  . Cigarette smoker   Has cut back Smoking to 1 pack every other day . COPD (chronic obstructive pulmonary disease) ([place])  . Depression  . Diabetic neuropathy ([place])  . Diverticulitis 2015  gi recommended repeat scope in 10 years . Essential hypertension  . Fatty liver  . GERD (gastroesophageal reflux disease)  . Gout  . History of mammogram 05/28/2013 . Hyperlipidemia  . Hypothyroidism  . Insomnia  . Iron deficiency anemia  . Low serum vitamin D  . Personal history of tobacco use, presenting hazards to health 09/29/2015 .  Plantar fasciitis  . RLS (restless legs syndrome)  . Shortness of breath dyspnea  . Thyroid cancer ([place]) 2009 .  Tremors of nervous system   "I think I have parkinson's disease" . Type 2 diabetes mellitus ([place])  . Wears dentures   uppers Past Surgical History: Past Surgical History: Procedure Laterality Date . CATARACT EXTRACTION W/PHACO Left 11/24/2015  Procedure: CATARACT EXTRACTION PHACO AND INTRAOCULAR LENS PLACEMENT (IOC);  Surgeon: Birder Robson, MD;  Location: [facility] ORS;  Service: Ophthalmology;  Laterality: Left;  [place] 38.3 AP% 20.6 CDE 7.89 FLUID PACK LOT # [id number] H . CATARACT EXTRACTION W/PHACO Right 06/23/2019  Procedure: CATARACT EXTRACTION PHACO AND INTRAOCULAR LENS PLACEMENT ([place]) RIGHT DIABETIC;  Surgeon: Birder Robson, MD;  Location: [facility];  Service: Ophthalmology;  Laterality: Right;  Diabetic . COLONOSCOPY  2004, 2009, 2015  [place] . POLYPECTOMY  2009 . THYROIDECTOMY  2009  states she was tx with radiactive iodine . TUBAL LIGATION   . UPPER GASTROINTESTINAL ENDOSCOPY   HPI: Pt  is a 74 y.o. female with a medical history significant for Multiple issues including GERD, restless leg syndrome, essential tremors, HTN, DM, COPD, tobacco use, gout, depression/anxiety and chronic lower extremity edema/cellulitis.  Patient [facility] ED [facility]20 for leg edema and worsening shortness of breath and was found with O2 saturations in the 80's. She was found to have a BNP of 1113 and imaging showed mild cardiomegaly and pulmonary edema. Her lab work also showed worsening proteinuria. She was admitted for further work up.  per Pulmonary, pt has dx of Acute on chronic hypoxemic respiratory failure; BiPAP at night, on increased [place] O2 support during the day.  Pt has some degree of Cognitive decline - decreased attention and follow through w/ tasks w/out cues; often calling out Loudly.  CXR: Unchanged multifocal patchy airspace opacities within both lungs  which could be due to multifocal pneumonia.  Subjective: Bloody sputum on pt's chin and inside her mask when she arrived Assessment / Plan /  Recommendation CHL IP CLINICAL IMPRESSIONS 04/19/2020 Clinical Impression Patient presents with moderately severe oropharyngeal dysphagia, with trace silent aspiration of thin and nectar thick liquids and laryngeal penetration of honey thick liquids and puree. When pt arrived to fluoro, she had coughed bloody-tinged secretions into her mask. Oral stage is characterized by sluggish, discoordinated bolus formation, decreased bolus cohesion with premature spillage to the pharynx. Swallow initiation is delayed to the level of the pyriform sinuses, resulting in delayed laryngeal closure and penetration of all consistencies before the swallow, aspiration of thin and nectar thick liquids during the swallow which were not sensed by the patient. Penetration/aspiration appears to occur due to impaired timing/sensation vs pharyngeal weakness. She is only intermittently able to follow commands for throat clear/cough, which was marginally effective at clearing shallower penetration. There is intermittent mild residue in the oral cavity, with spillover occurring to the valleculae between frames. A chin tuck was attempted, with max cues required; this did not prevent penetration or aspiration (with nectar). Smaller (1/2-1 tsp) size boluses of thicker consistencies were better contained in the valleculae, reducing potential for aspiration from premature spill.  Recommend dys 1 (puree) and honey thick liquids by teaspoon, with assist for self-feeding and cuing for throat clearing intermittently to eject laryngeal penetration. Patient is at risk for aspiration regardless of consistency, although there may be some potential for improvement if her mental status improves. SLP Visit Diagnosis Dysphagia, oropharyngeal phase (R13.12) Attention and concentration deficit following -- Frontal lobe and executive  function deficit following -- Impact on safety and function Moderate aspiration risk;Severe aspiration risk   CHL IP TREATMENT  RECOMMENDATION 04/19/2020 Treatment Recommendations Therapy as outlined in treatment plan below   Prognosis 04/19/2020 Prognosis for Safe Diet Advancement Fair Barriers to Reach Goals Time post onset;Severity of deficits;Behavior Barriers/Prognosis Comment -- CHL IP DIET RECOMMENDATION 04/19/2020 SLP Diet Recommendations Dysphagia 1 (Puree) solids;Honey thick liquids Liquid Administration via Spoon Medication Administration Crushed with puree Compensations Minimize environmental distractions;Slow rate;Small sips/bites;Lingual sweep for clearance of pocketing;Clear throat intermittently Postural Changes Seated upright at 90 degrees   CHL IP OTHER RECOMMENDATIONS 04/19/2020 Recommended Consults -- Oral Care Recommendations Oral care QID Other Recommendations Order thickener from pharmacy;Prohibited food (jello, ice cream, thin soups)   CHL IP FOLLOW UP RECOMMENDATIONS 04/19/2020 Follow up Recommendations Other (comment)   CHL IP FREQUENCY AND DURATION 04/19/2020 Speech Therapy Frequency (ACUTE ONLY) min 2x/week Treatment Duration 1 week      CHL IP ORAL PHASE 04/19/2020 Oral Phase Impaired Oral - Pudding Teaspoon -- Oral - Pudding Cup -- Oral - Honey Teaspoon Lingual/palatal residue;Decreased bolus cohesion;Premature spillage Oral - Honey Cup NT Oral - Nectar Teaspoon Lingual/palatal residue;Decreased bolus cohesion;Premature spillage Oral - Nectar Cup Lingual/palatal residue;Decreased bolus cohesion;Premature spillage Oral - Nectar Straw NT Oral - Thin Teaspoon Lingual/palatal residue;Decreased bolus cohesion;Premature spillage Oral - Thin Cup Lingual/palatal residue;Decreased bolus cohesion;Premature spillage Oral - Thin Straw NT Oral - Puree Lingual/palatal residue;Decreased bolus cohesion;Premature spillage Oral - Mech Soft -- Oral - Regular -- Oral - Multi-Consistency -- Oral - Pill -- Oral Phase - Comment --  CHL IP PHARYNGEAL PHASE 04/19/2020 Pharyngeal Phase Impaired Pharyngeal- Pudding Teaspoon -- Pharyngeal  -- Pharyngeal- Pudding Cup -- Pharyngeal -- Pharyngeal- Honey Teaspoon Delayed swallow initiation-pyriform sinuses;Penetration/Aspiration before swallow Pharyngeal Material enters airway, remains ABOVE vocal cords then ejected out;Material enters airway, remains ABOVE vocal cords and not ejected out Pharyngeal- Honey Cup NT Pharyngeal -- Pharyngeal- Nectar Teaspoon Delayed swallow initiation-pyriform sinuses;Penetration/Aspiration before swallow;Penetration/Aspiration during swallow;Trace aspiration;Compensatory strategies attempted (with notebox) Pharyngeal Material enters airway, remains ABOVE vocal cords and not ejected out;Material enters airway, passes BELOW cords without attempt by patient to eject out (silent aspiration) Pharyngeal- Nectar Cup Delayed swallow initiation-pyriform sinuses;Penetration/Aspiration before swallow;Penetration/Aspiration during swallow;Trace aspiration Pharyngeal Material enters airway, remains ABOVE vocal cords and not ejected out;Material enters airway, passes BELOW cords without attempt by patient to eject out (silent aspiration) Pharyngeal- Nectar Straw NT Pharyngeal -- Pharyngeal- Thin Teaspoon Delayed swallow initiation-pyriform sinuses;Penetration/Aspiration before swallow Pharyngeal Material enters airway, remains ABOVE vocal cords and not ejected out Pharyngeal- Thin Cup Delayed swallow initiation-pyriform sinuses;Penetration/Aspiration before swallow;Penetration/Aspiration during swallow;Trace aspiration Pharyngeal Material enters airway, remains ABOVE vocal cords and not ejected out;Material enters airway, passes BELOW cords without attempt by patient to eject out (silent aspiration) Pharyngeal- Thin Straw NT Pharyngeal -- Pharyngeal- Puree Delayed swallow initiation-pyriform sinuses;Penetration/Aspiration before swallow Pharyngeal Material enters airway, remains ABOVE vocal cords then ejected out;Material enters airway, remains ABOVE vocal cords and not ejected out  Pharyngeal- Mechanical Soft -- Pharyngeal -- Pharyngeal- Regular -- Pharyngeal -- Pharyngeal- Multi-consistency -- Pharyngeal -- Pharyngeal- Pill -- Pharyngeal -- Pharyngeal Comment --  CHL IP CERVICAL ESOPHAGEAL PHASE 04/19/2020 Cervical Esophageal Phase WFL Pudding Teaspoon -- Pudding Cup -- Honey Teaspoon -- Honey Cup -- Nectar Teaspoon -- Nectar Cup -- Nectar Straw -- Thin Teaspoon -- Thin Cup -- Thin Straw -- Puree -- Mechanical Soft -- Regular -- Multi-consistency -- Pill -- Cervical Esophageal Comment -- Deneise Lever, MS, CCC-SLP Speech-Language Pathologist KYANNE ESKIN 04/19/2020, 12:17  PM              ECHOCARDIOGRAM COMPLETE  Result Date: 04/04/2020    ECHOCARDIOGRAM REPORT   Patient Name:   SHELANA COCKCROFT Date of Exam: 04/04/2020 Medical Rec #:  [id number]      Height:       64.0 in Accession #:    [id number]     Weight:       181.8 lb Date of Birth:  16-Jun-1945     BSA:          1.879 m Patient Age:    14 years       BP:           171/78 mmHg Patient Gender: F              HR:           98 bpm. Exam Location:  [facility] Procedure: 2D Echo, Color Doppler, Cardiac Doppler and Intracardiac            Opacification Agent Indications:     I50.31 CHF-Acute Diastolic  History:         Patient has no prior history of Echocardiogram examinations.                  COPD, Signs/Symptoms:Shortness of Breath; Risk Factors:Diabetes                  and Current Smoker.  Sonographer:     Charmayne Sheer RDCS (AE) Referring Phys:  Baker Janus Soledad Gerlach NIU Diagnosing Phys: Bartholome Bill MD  Sonographer Comments: Technically difficult study due to poor echo windows. Image acquisition challenging due to patient body habitus and Image acquisition challenging due to COPD. IMPRESSIONS  1. Left ventricular ejection fraction, by estimation, is 55 to 60%. The left ventricle has normal function. The left ventricle has no regional wall motion abnormalities. Left ventricular diastolic parameters are consistent with Grade I diastolic dysfunction  (impaired relaxation).  2. Right ventricular systolic function is normal. The right ventricular size is mildly enlarged.  3. Left atrial size was mildly dilated.  4. Right atrial size was mildly dilated.  5. The mitral valve was not well visualized. Trivial mitral valve regurgitation.  6. The aortic valve was not well visualized. Aortic valve regurgitation is trivial. FINDINGS  Left Ventricle: Left ventricular ejection fraction, by estimation, is 55 to 60%. The left ventricle has normal function. The left ventricle has no regional wall motion abnormalities. Definity contrast agent was given IV to delineate the left ventricular  endocardial borders. The left ventricular internal cavity size was normal in size. There is borderline left ventricular hypertrophy. Left ventricular diastolic parameters are consistent with Grade I diastolic dysfunction (impaired relaxation). Right Ventricle: The right ventricular size is mildly enlarged. No increase in right ventricular wall thickness. Right ventricular systolic function is normal. Left Atrium: Left atrial size was mildly dilated. Right Atrium: Right atrial size was mildly dilated. Pericardium: There is no evidence of pericardial effusion. Mitral Valve: The mitral valve was not well visualized. Trivial mitral valve regurgitation. MV peak gradient, 7.4 mmHg. The mean mitral valve gradient is 3.0 mmHg. Tricuspid Valve: The tricuspid valve is not well visualized. Tricuspid valve regurgitation is trivial. Aortic Valve: The aortic valve was not well visualized. Aortic valve regurgitation is trivial. Aortic valve mean gradient measures 3.0 mmHg. Aortic valve peak gradient measures 6.8 mmHg. Aortic valve area, by VTI measures 2.46 cm. Pulmonic Valve: The pulmonic valve was not well visualized. Pulmonic valve regurgitation  is not visualized. Aorta: The aortic root was not well visualized. IAS/Shunts: The interatrial septum was not assessed.  LEFT VENTRICLE PLAX 2D LVIDd:          4.15 cm     Diastology LVIDs:         3.64 cm     LV e' medial:    5.98 cm/s LV PW:         1.43 cm     LV E/e' medial:  18.3 LV IVS:        1.12 cm     LV e' lateral:   6.53 cm/s LVOT diam:     2.00 cm     LV E/e' lateral: 16.8 LV SV:         55 LV SV Index:   29 LVOT Area:     3.14 cm  LV Volumes (MOD) LV vol d, MOD A2C: 55.5 ml LV vol d, MOD A4C: 63.8 ml LV vol s, MOD A2C: 34.3 ml LV vol s, MOD A4C: 23.4 ml LV SV MOD A2C:     21.2 ml LV SV MOD A4C:     63.8 ml LV SV MOD BP:      34.2 ml RIGHT VENTRICLE RV Basal diam:  3.76 cm LEFT ATRIUM             Index       RIGHT ATRIUM           Index LA diam:        4.90 cm 2.61 cm/m  RA Area:     19.80 cm LA Vol (A2C):   59.5 ml 31.67 ml/m RA Volume:   61.10 ml  32.52 ml/m LA Vol (A4C):   57.5 ml 30.61 ml/m LA Biplane Vol: 61.2 ml 32.58 ml/m  AORTIC VALVE                   PULMONIC VALVE AV Area (Vmax):    2.51 cm    PV Vmax:       0.76 m/s AV Area (Vmean):   2.43 cm    PV Vmean:      53.200 cm/s AV Area (VTI):     2.46 cm    PV VTI:        0.126 m AV Vmax:           130.00 cm/s PV Peak grad:  2.3 mmHg AV Vmean:          83.500 cm/s PV Mean grad:  1.0 mmHg AV VTI:            0.225 m AV Peak Grad:      6.8 mmHg AV Mean Grad:      3.0 mmHg LVOT Vmax:         104.00 cm/s LVOT Vmean:        64.700 cm/s LVOT VTI:          0.176 m LVOT/AV VTI ratio: 0.78  AORTA Ao Root diam: 3.10 cm MITRAL VALVE                TRICUSPID VALVE MV Area (PHT): 3.00 cm     TR Peak grad:   28.9 mmHg MV Peak grad:  7.4 mmHg     TR Vmax:        269.00 cm/s MV Mean grad:  3.0 mmHg MV Vmax:       1.36 m/s     SHUNTS MV Vmean:      86.9  cm/s    Systemic VTI:  0.18 m MV Decel Time: 253 msec     Systemic Diam: 2.00 cm MV E velocity: 109.50 cm/s MV A velocity: 137.50 cm/s MV E/A ratio:  0.80 Bartholome Bill MD Electronically signed by Bartholome Bill MD Signature Date/Time: 04/04/2020/4:54:02 PM    Final    US Abdomen Limited RUQ (LIVER/GB)  Result Date: 04/11/2020 CLINICAL DATA:  Hyperbilirubinemia  in a 74 year old female EXAM: ULTRASOUND ABDOMEN LIMITED RIGHT UPPER QUADRANT COMPARISON:  April 07, 2020 CT angiography of the chest and CT of the chest of April 06, 2020 FINDINGS: Gallbladder: Wall thickness at upper limits of normal. No visible pericholecystic fluid or evidence of cholelithiasis. No reported tenderness over the gallbladder. Common bile duct: Diameter: 2.2 mm Liver: Mildly heterogeneous echotexture with nodular contour and suggestion of fissural widening particularly on image 22. No visible lesion on submitted images. Portal vein is patent on color Doppler imaging with normal direction of blood flow towards the liver. Other: Trace ascites IMPRESSION: 1. Heterogeneous, mildly heterogeneous hepatic echotexture with question of lobular contour and fissural widening, findings could be seen in the setting of early liver disease. 2. Gallbladder wall thickness at upper limits of normal could be seen in the setting of liver disease and is nonspecific, not associated with biliary calculi, reported tenderness or biliary duct dilation. Electronically Signed   By: Zetta Bills M.D.   On: 04/11/2020 12:23    Assessment & Plan     Principal Problem: Acute CHF (congestive heart failure) (HCC) Active Problems: COPD exacerbation (HCC) Diabetes mellitus without complication ([place]) Current tobacco use Hyperlipidemia Essential hypertension Hypothyroidism Gout Depression Tremors of nervous system Acute respiratory failure with hypoxia (HCC) Cellulitis of lower extremity Acute pulmonary edema (HCC) Elevated brain natriuretic peptide (BNP) level Nephrotic syndrome   1.Acute on chronic diastolic congestive heart failure,presenting with shortness of breath and bilateral lower extremity edema,LVEF 55 to 60% by 2D echocardiogramp. .  She is -10.5 L since hospital admission.  BNP is improved to 674 down from 1371 2 days ago.   She is on carvedilol 12.5 mg bid  and losartan 100 daily, amlodipine 10 mg daily with blood pressure control improved.   2.Acute on chronic hypoxic respiratory failure, multifactorial, secondary to CHF, and right lower lobe pneumonia, followed by pulmonary,O2 by nasal cannula. Continue with steroids and abx.  3.Encephalopathy, improvedover the course of the admission.    4.Nephrotic syndrome, followed by nephrology.    5.Essential hypertension,on amlodipine, carvedilol, andlosartan,blood pressure is improved on this regimen.   6. COPD exacerbationwith tobacco use history. Appreciate Dr. Teodoro Kil  input.   7. Diarrhea and abdominal pain,now followed by infectious disease  Recommendations: 1.Recommendfollowing symptoms and electrolytes as well as bnp.  BNP is improved.  Continue off diuretics for now.  2.Continuecarvedilol 12.5 bid daily 3. Defer further cardiac diagnostics at this time. 4. Follow-up with Dr. Saralyn Pilar 1-2 weeks after dischargewhen stable.  Signed, Javier Docker Taher Vannote MD 04/20/2020, 8:09 AM  Pager: [phone]  Dr. Nehemiah Massed will be covering my patients through the weekend.

## 2020-04-20 NOTE — Progress Notes (Signed)
OT Cancellation Note  Patient Details Name: Martha [name] MRN: [id number] DOB: Dec 23, 1945   Cancelled Treatment:    Reason Eval/Treat Not Completed: Other (comment)  Pt to d/c home with hospice. Will sign off at this time. Thank you.  Gerrianne Scale, [place], OTR/L ascom [phone] 04/20/20, 1:39 PM

## 2020-04-20 NOTE — TOC Progression Note (Signed)
Transition of Care [facility]) - Progression Note    Patient Details  Name: SHIMIKA AMES MRN: [id number] Date of Birth: 1946-04-22  [facility]) CM/SW Contact  Eileen Stanford, LCSW Phone Number: 04/20/2020, 10:58 AM  Clinical Narrative:   Patient will dc home tomorrow with hospice ([facility] Referral provided to Santiago Glad with [facility].    Expected Discharge Plan: [place]    Expected Discharge Plan and Services Expected Discharge Plan: [place] arrangements for the past 2 months: Single Family Home                                       Social Determinants of Health (SDOH) Interventions    Readmission Risk Interventions Readmission Risk Prevention Plan 04/04/2020  Transportation Screening Complete  HRI or Home Care Consult Complete  Medication Review (RN Care Manager) Complete  Some recent data might be hidden

## 2020-04-20 NOTE — Progress Notes (Addendum)
[facility] Liaison note:  New referral for [facility] hospice services to follow at home received from Palliative NP Quinn Axe. TOC Bridget Cobb aware. Patient information sent to referral. Hospice eligibility confirmed.  Writer met in the room with patient, her daughter Martha [name] and significant other Martha [name] to initiate education regarding hospice services, philosophy and team approach to care with understanding voiced. DME needs discussed, patient will require oxygen to be in place Questions answered. Hospice information and contact numbers given to [facility]. Discharge pending DME delivery. Rico Sheehan and Attending physician Dr. Arbutus Ped aware.  Patient will require nonemergent transport at discharge. Signed out of facility DNR is in place in the patient's chart. Liaison to continue to follow through discharge.  Thank you for the opportunity to be involved in the care of this patient and her family. Martha [name] BSN, RN, [facility] Liaison [facility] [phone]  UPDATE: 3:30 pm per [facility], patient will not discharge until 12/23. Referral updated.

## 2020-04-21 LAB — GLUCOSE, CAPILLARY
Glucose-Capillary: 136 mg/dL — ABNORMAL HIGH (ref 70–99)
Glucose-Capillary: 255 mg/dL — ABNORMAL HIGH (ref 70–99)

## 2020-04-21 MED ORDER — LEVOTHYROXINE SODIUM 50 MCG PO TABS
150.0000 ug | ORAL_TABLET | Freq: Every day | ORAL | Status: DC
  Filled 2020-04-21: qty 3

## 2020-04-21 MED ORDER — PANTOPRAZOLE SODIUM 40 MG PO TBEC: 40.0000 mg | DELAYED_RELEASE_TABLET | Freq: Every day | ORAL | 0 refills | Status: DC

## 2020-04-21 MED ORDER — ACETAMINOPHEN 650 MG RE SUPP: 650.0000 mg | Freq: Four times a day (QID) | RECTAL | 0 refills | Status: DC | PRN

## 2020-04-21 MED ORDER — ORAL CARE MOUTH RINSE: 15.0000 mL | Freq: Two times a day (BID) | OROMUCOSAL | 0 refills | Status: DC

## 2020-04-21 MED ORDER — ONDANSETRON 8 MG PO TBDP: 8.0000 mg | ORAL_TABLET | Freq: Three times a day (TID) | ORAL | 0 refills | Status: DC | PRN

## 2020-04-21 MED ORDER — GLYCOPYRROLATE 1 MG PO TABS: 1.0000 mg | ORAL_TABLET | Freq: Three times a day (TID) | ORAL | 0 refills | Status: DC

## 2020-04-21 MED ORDER — ACETAMINOPHEN 325 MG PO TABS: 650.0000 mg | ORAL_TABLET | Freq: Four times a day (QID) | ORAL | Status: DC | PRN

## 2020-04-21 MED ORDER — PROMETHAZINE HCL 6.25 MG/5ML PO SYRP: 12.5000 mg | ORAL_SOLUTION | Freq: Four times a day (QID) | ORAL | 1 refills | Status: AC | PRN

## 2020-04-21 MED ORDER — MORPHINE SULFATE (CONCENTRATE) 10 MG /0.5 ML PO SOLN: 5.0000 mg | ORAL | 0 refills | Status: DC | PRN

## 2020-04-21 MED ORDER — ALBUTEROL SULFATE (2.5 MG/3ML) 0.083% IN NEBU: 2.5000 mg | INHALATION_SOLUTION | RESPIRATORY_TRACT | 0 refills | Status: DC | PRN

## 2020-04-21 MED ORDER — CALCIUM CARBONATE ANTACID 500 MG PO CHEW: 1.0000 | CHEWABLE_TABLET | Freq: Three times a day (TID) | ORAL | Status: DC | PRN

## 2020-04-21 MED ORDER — DM-GUAIFENESIN ER 30-600 MG PO TB12: 1.0000 | ORAL_TABLET | Freq: Two times a day (BID) | ORAL | 0 refills | Status: DC | PRN

## 2020-04-21 MED ORDER — IPRATROPIUM-ALBUTEROL 0.5-2.5 (3) MG/3ML IN SOLN: 3.0000 mL | Freq: Three times a day (TID) | RESPIRATORY_TRACT | 0 refills | Status: DC

## 2020-04-21 NOTE — TOC Transition Note (Signed)
Transition of Care [facility]) - CM/SW Discharge Note   Patient Details  Name: Martha [name] MRN: [id number] Date of Birth: 08/11/1945  Transition of Care [facility]) CM/SW Contact:  Eileen Stanford, LCSW Phone Number: 04/21/2020, 11:27 AM   Clinical Narrative:  Pt dc home with hospice. [facility] transport set for 2-2:30.     Final next level of care: Home w Hospice Care Barriers to Discharge: No Barriers Identified   Patient Goals and CMS Choice        Discharge Placement                  Name of family member notified: pt's daughter present at bedside Patient and family notified of of transfer: 04/21/20  Discharge Plan and Services                                     Social Determinants of Health (SDOH) Interventions     Readmission Risk Interventions Readmission Risk Prevention Plan 04/04/2020  Transportation Screening Complete  HRI or Home Care Consult Complete  Medication Review Press photographer) Complete  Some recent data might be hidden

## 2020-04-21 NOTE — Care Management Important Message (Signed)
Important Message  Patient Details  Name: Martha [name] MRN: [id number] Date of Birth: 30-Mar-1946   Medicare Important Message Given:  Yes     Juliann Pulse A [place] 04/21/2020, 11:17 AM

## 2020-04-21 NOTE — Discharge Summary (Signed)
Physician Discharge Summary  Martha [name] WUX:[id number] DOB: July 09, 1945 DOA: 04/03/2020  PCP: Tracie Harrier, MD  Admit date: 04/03/2020 Discharge date: 04/21/2020  Admitted From: home Disposition:  Home with hospice  Recommendations for Outpatient Follow-up:  1. Follow up with home hospice  Home Health: hospice   Equipment/Devices: Oxygen   Discharge Condition: Stable   CODE STATUS: DNR  Diet recommendation: Dysphagia 1 (pureed) with honey thick liquids - per SLP recommendations.    Discharge Diagnoses: Principal Problem:   Acute CHF (congestive heart failure) (HCC) Active Problems:   COPD exacerbation (HCC)   Diabetes mellitus without complication ([place])   Current tobacco use   Hyperlipidemia   Essential hypertension   Hypothyroidism   Gout   Depression   Tremors of nervous system   Acute respiratory failure with hypoxia (HCC)   Cellulitis of lower extremity   Acute pulmonary edema (HCC)   Elevated brain natriuretic peptide (BNP) level   Nephrotic syndrome   Malnutrition of moderate degree    Summary of HPI and Hospital Course:  From H&P: "Martha [name] is a 74 y.o. female with medical history significant of  HTN, HLD, DM, COPD, tobacco abuse, hypothyroidism, gout, depression, tremor, proteinuria, depression, anxiety, GERD, diverticulitis, who presents with bilateral leg edema and pain, and SOB.   Patient states that she has chronic bilateral leg edema which has been progressively worsened recently. Both lower legs are painful and erythematous. She states that she gets her lower extremities wrapped once a week.  She has chronic cough and shortness of breath which has slightly worsened on exertion.  She coughs up some white mucus.  No fever or chills.  No chest pain.  On presentation she was hypoxic requiring BiPAP, later transitioned to 4 L of oxygen. Patient also has an history of proteinuria which is being worked up by nephrology as an outpatient and they are  planning for biopsy.   Patient had pertinent labs positive for BNP of 1113, troponin and COVID-19 negative.  Chest x-ray with borderline cardiomegaly and mild pulmonary edema.  Significant edema of bilateral lower extremities with signs of venous congestion, skin peeling and few shallow ulcers.  Cardiology was consulted."  Pt required Bipap again and close monitoring in step down unit on 12/11.  Was resumed on antibiotics for suspected aspiration pneumonia and steroids.  Patient has remained encephalopathic, aspirating, fairly significant oxygen requirement.   12/22 -family met with palliative care and have decided on pursuing hospice care at home.      Comfort measures only status -as of 12/22. --Comfort measures per orders as below --Notify hospice provider if any signs of pain or discomfort --Appreciate palliative care and hospice assistance     Prior assessment and plan before comfort status:  "Acute respiratory failure with hypoxia and hypercapnia Multifactorial in setting of acute on chronic diastolic heart failure, COPD exacerbation, aspiration pneumonia. Patient required as much as 10 L/min and 60% FiO2 via high flow nasal cannula in addition to be managed with BiPAP. Recently requiring 4-5 L/min   Acute on chronic diastolic heart failure Patient managed with aggressive IV diuresis which included Lasix drip from 12/6 until 12/10. Patient was then transitioned to IV Lasix intermittently secondary to initial overdiuresis. Echo from 12/6 significant for an EF of 55 to 60% with grade 1 diastolic dysfunction. Cardiology was consulted with recommendations for outpatient follow-up. Weight on admission of 82.5 kg. Weight today of 66.8kg. UOP over the last 24 hours of 129mL with multiple unmeasured occurrences.Net  IO Since Admission: -16,606.91 mL [04/19/20 1412] -Continue Coreg and losartan; discontinue metoprolol on discharge  Aspiration pneumonia Right lower lobe  pneumonia from aspiration. Patient treated with Zosyn IV which was transitioned to Unasyn IV. She completed 7 days of antibiotics  Dysphagia Patient appears to have continued issues with aspiration which may be affecting her respiratory status at this time. Patient is tremendously weak which is likely affecting her ability to swallow safely as well. At this time it may be beneficial for palliative care consult in case we are not able to significantly improve dysphagia issues.Patient with evidence of silent aspiration per SLP. Diet adjusted to puree and honey thickened liquids.  COPD exacerbation treated with IV Solu-Medrol, DuoNeb. Pulmonology was consulted.  Prednisone taper stopped given comfort status  Metabolic alkalosis Appears to be secondary to contraction alkalosis from diuresis. Patient with compensatory hypercapnia. Alkalosis is improving with decrease of Lasix therapy. CO2 on BMP is normal.  Acute urinary retention Unknown etiology. Foley placed on 12/14 for recurrent retention. Urinalysis not suggestive of infection and urine culture with no growth.  Has Foley and will be maintained given comfort care status.  Primary hypertension -Continue amlodipine, Coreg, losartan  Hyperbilirubinemia Transient in setting of fluid overload. Abdominal ultrasound significant for possible early liver disease. Resolved.  Nephrotic syndrome Nephrology consulted. Urinalysis significant for large proteinuria. Normal albumin. Patient initially managed with IV Lasix which is now been discontinued. Patient received intermittent Lasix as mentioned above. Initial plan for biopsy which was unable to be performed secondary to patient's inability to tolerate biopsy procedure.  Bilateral peripheral venous stasis Unna boots applied.  Hypokalemia Repleted. In setting of IV diuresis. No further labs  Diabetes mellitus, type II - stopped meds  Hypothyroidism -stopped  levothyroxine  Hyperlipidemia -stopped Crestor   Moderate malnutrition"      Discharge Instructions   Discharge Instructions    Call MD for:  persistant nausea and vomiting   Complete by: As directed    Call MD for:  severe uncontrolled pain   Complete by: As directed    Diet - low sodium heart healthy   Complete by: As directed    Discharge wound care:   Complete by: As directed    Per hospice providers.   Increase activity slowly   Complete by: As directed      Allergies as of 04/21/2020      Reactions   Nicotine Polacrilex Cough   Onset 04/29/2006.   Nicotrol [nicotine] Cough   Codeine Sulfate Itching   Onset 11/24/1998. tingling      Medication List    STOP taking these medications   alendronate 70 MG tablet Commonly known as: FOSAMAX   aspirin EC 81 MG tablet   cetirizine 10 MG tablet Commonly known as: ZYRTEC   colchicine 0.6 MG tablet   ferrous sulfate 325 (65 FE) MG EC tablet   furosemide 40 MG tablet Commonly known as: LASIX   glipiZIDE 10 MG tablet Commonly known as: GLUCOTROL   levothyroxine 150 MCG tablet Commonly known as: SYNTHROID   losartan 50 MG tablet Commonly known as: COZAAR   metFORMIN 1000 MG tablet Commonly known as: GLUCOPHAGE   ProAir HFA 108 (90 Base) MCG/ACT inhaler Generic drug: albuterol Replaced by: albuterol (2.5 MG/3ML) 0.083% nebulizer solution   rosuvastatin 5 MG tablet Commonly known as: CRESTOR   sertraline 50 MG tablet Commonly known as: ZOLOFT   tiZANidine 2 MG tablet Commonly known as: ZANAFLEX   vitamin B-12 1000 MCG tablet Commonly  known as: CYANOCOBALAMIN   vitamin C 500 MG tablet Commonly known as: ASCORBIC ACID   Vitamin D (Ergocalciferol) 1.25 MG (50000 UNIT) Caps capsule Commonly known as: DRISDOL     TAKE these medications   acetaminophen 650 MG suppository Commonly known as: TYLENOL Place 1 suppository (650 mg total) rectally every 6 (six) hours as needed for fever or mild  pain.   acetaminophen 325 MG tablet Commonly known as: TYLENOL Take 2 tablets (650 mg total) by mouth every 6 (six) hours as needed for mild pain, fever or headache.   albuterol (2.5 MG/3ML) 0.083% nebulizer solution Commonly known as: PROVENTIL Take 3 mLs (2.5 mg total) by nebulization every 4 (four) hours as needed for wheezing or shortness of breath. Replaces: ProAir HFA 108 (90 Base) MCG/ACT inhaler   calcium carbonate 500 MG chewable tablet Commonly known as: TUMS - dosed in mg elemental calcium Chew 1 tablet (200 mg of elemental calcium total) by mouth every 8 (eight) hours as needed for indigestion or heartburn.   dextromethorphan-guaiFENesin 30-600 MG 12hr tablet Commonly known as: MUCINEX DM Take 1 tablet by mouth 2 (two) times daily as needed for cough.   fluticasone 50 MCG/ACT nasal spray Commonly known as: FLONASE Place 1-2 sprays into both nostrils daily as needed for allergies or rhinitis.   gabapentin 600 MG tablet Commonly known as: NEURONTIN Take 600 mg by mouth 3 (three) times daily.   glycopyrrolate 1 MG tablet Commonly known as: Robinul Take 1 tablet (1 mg total) by mouth 3 (three) times daily.   ipratropium-albuterol 0.5-2.5 (3) MG/3ML Soln Commonly known as: DUONEB Take 3 mLs by nebulization 3 (three) times daily.   morphine CONCENTRATE 10 mg / 0.5 ml concentrated solution Take 0.25 mLs (5 mg total) by mouth every 2 (two) hours as needed for moderate pain, severe pain or shortness of breath.   mouth rinse Liqd solution 15 mLs by Mouth Rinse route 2 times daily at 12 noon and 4 pm.   ondansetron 8 MG disintegrating tablet Commonly known as: Zofran ODT Take 1 tablet (8 mg total) by mouth every 8 (eight) hours as needed for nausea or vomiting.   pantoprazole 40 MG tablet Commonly known as: PROTONIX Take 1 tablet (40 mg total) by mouth daily. Start taking on: April 22, 2020   primidone 50 MG tablet Commonly known as: MYSOLINE Take 50 mg by  mouth 2 (two) times daily.   promethazine 6.25 MG/5ML syrup Commonly known as: PHENERGAN Take 10 mLs (12.5 mg total) by mouth 4 (four) times daily as needed for nausea or vomiting.   rOPINIRole 0.5 MG tablet Commonly known as: REQUIP Take 0.5 mg by mouth at bedtime.            Durable Medical Equipment  (From admission, onward)         Start     Ordered   04/21/20 0814  For home use only DME oxygen  Once       Question Answer Comment  Length of Need Lifetime   Mode or (Route) Nasal cannula   Liters per Minute 5   Frequency Continuous (stationary and portable oxygen unit needed)   Oxygen delivery system Gas      04/21/20 0813           Discharge Care Instructions  (From admission, onward)         Start     Ordered   04/21/20 0000  Discharge wound care:  Comments: Per hospice providers.   04/21/20 1033          Contact information for follow-up providers    [facility] Follow up on 04/25/2020.   Specialty: Cardiology Why: at 12:00pm. Enter through the [facility] entrance Contact information: [place]           Contact information for after-discharge care    Destination    [facility] .   Service: Skilled Nursing Contact information: [address] [place] [phone]                 Allergies  Allergen Reactions  . Nicotine Polacrilex Cough    Onset 04/29/2006.  Marland Kitchen Nicotrol [Nicotine] Cough  . Codeine Sulfate Itching    Onset 11/24/1998. tingling    Consultations:  Pulmonology  Nephrology   Infectious Disease  Cardiology  Palliative Care   Procedures/Studies: DG Chest 1 View  Result Date: 04/09/2020 CLINICAL DATA:  Acute respiratory failure EXAM: CHEST  1 VIEW COMPARISON:  04/03/2016 FINDINGS: Normal cardiac silhouette. Interval increase in  interstitial edema pattern. No focal consolidation. No pneumothorax. IMPRESSION: Worsening interstitial edema. Electronically Signed   By: Suzy Bouchard M.D.   On: 04/09/2020 06:31   DG Chest 2 View  Result Date: 04/03/2020 CLINICAL DATA:  Hypoxia. Leg edema and shortness of breath. Pt daughter states that pt is supposed to be getting a renal bx but they are not able to do it because of the edema in pts legs. Pt states that she does feel short of breath. Pt does not wear oxygen at home. Pt placed on O2 in triage due to sats being in the 80s. Hx of asthma, chronic bronchitis, COPD, thyroid cancer- 2009, DM, current smoker. EXAM: CHEST - 2 VIEW COMPARISON:  01/04/2018 FINDINGS: Cardiac silhouette is borderline enlarged. Stable aortic atherosclerotic calcifications. No mediastinal or hilar masses. No evidence of adenopathy. Lungs demonstrate diffuse bilateral irregular interstitial thickening which has increased compared to the prior study. There is also linear/reticular scarring in the left lung base, which is stable. No lung consolidation. No pleural effusion and no pneumothorax. Skeletal structures are demineralized, but grossly intact. IMPRESSION: 1. Bilateral irregular interstitial thickening, increased compared to the prior study, along with borderline cardiomegaly. Suspect mild congestive heart failure. Consider diffuse interstitial infection or inflammation in the proper clinical setting. No evidence of lobar pneumonia. Electronically Signed   By: Lajean Manes M.D.   On: 04/03/2020 13:06   CT CHEST W CONTRAST  Result Date: 04/06/2020 CLINICAL DATA:  Hemoptysis. EXAM: CT CHEST WITH CONTRAST TECHNIQUE: Multidetector CT imaging of the chest was performed during intravenous contrast administration. CONTRAST:  6m OMNIPAQUE IOHEXOL 300 MG/ML  SOLN COMPARISON:  January 06, 2019 FINDINGS: Cardiovascular: There is marked severity calcification of the aortic arch. There is mild cardiomegaly. No pericardial  effusion. Mediastinum/Nodes: There is mild pretracheal and right hilar lymphadenopathy. Thyroid gland, trachea, and esophagus demonstrate no significant findings. Lungs/Pleura: Mild-to-moderate severity emphysematous lung disease is seen involving the bilateral upper lobes with stable bullous disease is seen along the medial aspect of the left apex. Mild to moderate severity areas of atelectasis and/or infiltrate are seen within the posterior aspects of the bilateral upper lobes and left lung base. Moderate to marked severity posterior right basilar atelectasis and/or infiltrate is noted. A very small right pleural effusion is seen. No pneumothorax is identified. Upper Abdomen: There  is stable hepatosplenomegaly with stable, moderate severity diffuse bilateral adrenal gland enlargement. Musculoskeletal: A chronic lateral eighth left rib fracture is seen. A chronic compression fracture deformity of the T12 vertebral body is noted. IMPRESSION: 1. Mild-to-moderate severity posterior bilateral upper lobe and left basilar atelectasis and/or infiltrate. 2. Moderate to marked severity posterior right basilar atelectasis and/or infiltrate. 3. Very small right pleural effusion. 4. Mild cardiomegaly. 5. Stable emphysematous lung disease. 6. Chronic lateral eighth left rib fracture. 7. Chronic compression fracture deformity of the T12 vertebral body. 8. Aortic atherosclerosis. Aortic Atherosclerosis (ICD10-I70.0) and Emphysema (ICD10-J43.9). Electronically Signed   By: Virgina Norfolk M.D.   On: 04/06/2020 15:33   CT ANGIO CHEST PE W OR WO CONTRAST  Result Date: 04/07/2020 CLINICAL DATA:  Shortness of breath EXAM: CT ANGIOGRAPHY CHEST WITH CONTRAST TECHNIQUE: Multidetector CT imaging of the chest was performed using the standard protocol during bolus administration of intravenous contrast. Multiplanar CT image reconstructions and MIPs were obtained to evaluate the vascular anatomy. CONTRAST:  54mL OMNIPAQUE IOHEXOL 350  MG/ML SOLN COMPARISON:  April 06, 2020 FINDINGS: Cardiovascular: There is a optimal opacification of the pulmonary arteries. There is no central,segmental, or subsegmental filling defects within the pulmonary arteries. There is unchanged cardiomegaly. Mitral valve calcifications and coronary artery calcifications are seen. No pericardial effusion or thickening. No evidence right heart strain. There is normal three-vessel brachiocephalic anatomy without proximal stenosis. Scattered aortic atherosclerosis is noted. Mediastinum/Nodes: No hilar, mediastinal, or axillary adenopathy. Thyroid gland, trachea, and esophagus demonstrate no significant findings. Lungs/Pleura: Patchy airspace opacity seen at the left upper lung and posterior right lung base with air bronchograms. There are trace bilateral pleural effusions present. Centrilobular and paraseptal emphysematous changes seen at both lung apices. Upper Abdomen: No acute abnormalities present in the visualized portions of the upper abdomen. Musculoskeletal: No chest wall abnormality. No acute or significant osseous findings. There is a chronic anterior wedge compression deformity of the T12 vertebral body with 50% loss in vertebral body height. Review of the MIP images confirms the above findings. IMPRESSION: No central, segmental, or subsegmental pulmonary embolism. Unchanged multifocal patchy airspace opacities within both lungs which could be due to multifocal pneumonia Trace bilateral pleural effusions, right greater than left. Aortic Atherosclerosis (ICD10-I70.0). Electronically Signed   By: Prudencio Pair M.D.   On: 04/07/2020 19:43   CT CHEST ABDOMEN PELVIS W CONTRAST  Result Date: 04/12/2020 CLINICAL DATA:  Shortness of breath, abdominal pain, and bilateral leg swelling. EXAM: CT CHEST, ABDOMEN, AND PELVIS WITH CONTRAST TECHNIQUE: Multidetector CT imaging of the chest, abdomen and pelvis was performed following the standard protocol during bolus  administration of intravenous contrast. CONTRAST:  11mL OMNIPAQUE IOHEXOL 300 MG/ML  SOLN COMPARISON:  Right upper quadrant ultrasound from yesterday. CTA chest dated April 07, 2020. FINDINGS: CT CHEST FINDINGS Cardiovascular: Unchanged borderline cardiomegaly. No pericardial effusion. No thoracic aortic aneurysm or dissection. Coronary, aortic arch, and branch vessel atherosclerotic vascular disease. 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 No central pulmonary embolism. Mediastinum/Nodes: Unchanged mildly enlarged precarinal lymph node measuring 12 mm in short axis, stable since September 2020, likely reactive. No enlarged hilar or axillary lymph nodes. Prior thyroidectomy. Prominent circumferential wall thickening of the proximal and mid esophagus, slightly worsened since the prior study. Lungs/Pleura: Slightly increased small right pleural effusion. New trace left pleural effusion. Prominent atelectasis within the right lower lobe with patchy areas of non enhancement and air bronchograms consistent with underlying pneumonia, slightly  worsened when compared to prior study. Increased patchy consolidation in both posterior upper lobes. Minimal subsegmental atelectasis in the left lower lobe. Moderate centrilobular and mild paraseptal emphysema again noted with bullous changes in the medial left lung apex. No pneumothorax. 7 mm subpleural nodule in the left upper lobe (series 4, image 39), similar to most recent chest CTs, but slightly more conspicuous when compared to CT chest from September 2020. Musculoskeletal: No acute or significant osseous findings. Unchanged chronic T12 compression deformity. Old left eighth rib fracture. CT ABDOMEN PELVIS FINDINGS Hepatobiliary: Slightly lobular liver contour. No focal liver abnormality is seen. No gallstones, gallbladder wall thickening, or biliary dilatation. Pancreas:  Unremarkable. No pancreatic ductal dilatation or surrounding inflammatory changes. Spleen: Normal in size without focal abnormality. Adrenals/Urinary Tract: Chronic bilateral adrenal hypertrophy. No renal calculi, focal lesion, or hydronephrosis. The bladder is unremarkable. Stomach/Bowel: Stomach is within normal limits. Appendix appears normal. No evidence of bowel wall thickening, distention, or inflammatory changes. Prominent sigmoid colonic diverticulosis. Vascular/Lymphatic: Aortic atherosclerosis. 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 No enlarged abdominal or pelvic lymph nodes. Reproductive: Uterus and bilateral adnexa are unremarkable. Other: Scattered trace ascites. No pneumoperitoneum. Small fat containing paraumbilical hernias. Musculoskeletal: No acute or significant osseous findings. IMPRESSION: CT chest: 1. Mildly worsened multifocal pneumonia. 2. Slightly increased small right pleural effusion. New trace left pleural effusion. 3. 7 mm subpleural nodule in the left upper lobe, similar to most recent chest CTs, but slightly more conspicuous when compared to CT chest from September 2020. While this may be infectious or inflammatory, attention on follow-up imaging is recommended. 4. Prominent circumferential wall thickening of the proximal and mid esophagus, slightly worsened since the prior study, concerning for esophagitis. 5. 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 6. Aortic Atherosclerosis (ICD10-I70.0) and Emphysema (ICD10-J43.9). CT abdomen and pelvis: 1. No acute intra-abdominal process. 2. Slightly lobular liver contour with scattered trace ascites, suggestive of cirrhosis. 3. 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 Electronically Signed   By: Titus Dubin M.D.   On: 04/12/2020 13:51   US Venous Img Lower Bilateral (DVT)  Result Date: 04/03/2020 CLINICAL DATA:   Lower leg cellulitis EXAM: BILATERAL LOWER EXTREMITY VENOUS DOPPLER ULTRASOUND TECHNIQUE: Gray-scale sonography with graded compression, as well as color Doppler and duplex ultrasound were performed to evaluate the lower extremity deep venous systems from the level of the common femoral vein and including the common femoral, femoral, profunda femoral, popliteal and calf veins including the posterior tibial, peroneal and gastrocnemius veins when visible. The superficial great saphenous vein was also interrogated. Spectral Doppler was utilized to evaluate flow at rest and with distal augmentation maneuvers in the common femoral, femoral and popliteal veins. COMPARISON:  None. FINDINGS: RIGHT LOWER EXTREMITY Common Femoral Vein: No evidence of thrombus. Normal compressibility, respiratory phasicity and response to augmentation. Saphenofemoral Junction: No evidence of thrombus. Normal compressibility and flow on color Doppler imaging. Profunda Femoral Vein: No evidence of thrombus. Normal compressibility and flow on color Doppler imaging. Femoral Vein: No evidence of thrombus. Normal compressibility, respiratory phasicity and response to augmentation. Popliteal Vein: Not well visualized due to overlying edema. Calf Veins: Not well visualized due to overlying edema. Superficial Great Saphenous Vein: No evidence of thrombus. Normal compressibility. Venous Reflux:  None. Other Findings:  None. LEFT LOWER EXTREMITY Common Femoral Vein: No evidence of thrombus. Normal compressibility, respiratory phasicity and response to augmentation. Saphenofemoral Junction: No evidence of thrombus. Normal  compressibility and flow on color Doppler imaging. Profunda Femoral Vein: No evidence of thrombus. Normal compressibility and flow on color Doppler imaging. Femoral Vein: No evidence of thrombus. Normal compressibility, respiratory phasicity and response to augmentation. Popliteal Vein: Not well visualized due to overlying edema. Calf  Veins: Not well visualized due to overlying edema. Superficial Great Saphenous Vein: No evidence of thrombus. Normal compressibility. Venous Reflux:  None. Other Findings:  None. IMPRESSION: Somewhat limited exam due to peripheral edema although no central deep venous thrombosis is noted. Electronically Signed   By: Inez Catalina M.D.   On: 04/03/2020 15:58   DG Chest Port 1 View  Result Date: 04/17/2020 CLINICAL DATA:  Respiratory failure EXAM: PORTABLE CHEST 1 VIEW COMPARISON:  April 15, 2020 FINDINGS: The cardiomediastinal silhouette is unchanged in contour.Atherosclerotic calcifications of the aorta. Small RIGHT pleural effusion. No pneumothorax. Diffuse interstitial prominence. Decreased LEFT lower lung atelectasis. Persistent heterogeneous opacity of the RIGHT lung base. Visualized abdomen is unremarkable. Multilevel degenerative changes of the thoracic spine. Remote LEFT-sided rib fracture. IMPRESSION: 1. Decreased LEFT lower lung atelectasis. Persistent heterogeneous opacity of the RIGHT lung base with diffuse interstitial prominence. 2.  Unchanged small RIGHT pleural effusion. Electronically Signed   By: Valentino Saxon MD   On: 04/17/2020 14:51   DG Chest Port 1 View  Result Date: 04/15/2020 CLINICAL DATA:  Shortness of breath with soft tissue edema EXAM: PORTABLE CHEST 1 VIEW COMPARISON:  Chest radiograph April 09, 2020 and chest CT April 12, 2020 FINDINGS: There is diffuse interstitial prominence, likely representing a combination of fibrosis and interstitial pulmonary edema. There is atelectatic change in the left mid lung. There are small pleural effusions bilaterally. No consolidation. There is cardiomegaly with a degree of pulmonary venous hypertension. There is aortic atherosclerosis. No appreciable adenopathy. Foci of calcification noted in each carotid artery. Calcification noted in the lateral left shoulder, stable. IMPRESSION: Suspect combination of interstitial pulmonary  edema and underlying fibrosis. No consolidation. Small pleural effusions bilaterally. Cardiomegaly with pulmonary vascular congestion. Suspect a degree of underlying congestive heart failure. Aortic Atherosclerosis (ICD10-I70.0). Electronically Signed   By: Lowella Grip III M.D.   On: 04/15/2020 09:01   DG Swallowing Func-Speech Pathology  Result Date: 04/19/2020 Objective Swallowing Evaluation: Type of Study: MBS-Modified Barium Swallow Study  Patient Details Name: Martha [name] MRN: [id number] Date of Birth: 1945-12-05 Today's Date: 04/19/2020 Time: SLP Start Time (ACUTE ONLY): 0805 -SLP Stop Time (ACUTE ONLY): 0825 SLP Time Calculation (min) (ACUTE ONLY): 20 min Past Medical History: Past Medical History: Diagnosis Date . Abnormal LFTs (liver function tests)  . Anxiety  . Arthritis   knees, feet . Asthma  . Chronic bronchitis ([place])  . Chronic constipation  . Chronic low back pain  . Cigarette smoker   Has cut back Smoking to 1 pack every other day . COPD (chronic obstructive pulmonary disease) ([place])  . Depression  . Diabetic neuropathy ([place])  . Diverticulitis 2015  gi recommended repeat scope in 10 years . Essential hypertension  . Fatty liver  . GERD (gastroesophageal reflux disease)  . Gout  . History of mammogram 05/28/2013 . Hyperlipidemia  . Hypothyroidism  . Insomnia  . Iron deficiency anemia  . Low serum vitamin D  . Personal history of tobacco use, presenting hazards to health 09/29/2015 . Plantar fasciitis  . RLS (restless legs syndrome)  . Shortness of breath dyspnea  . Thyroid cancer ([place]) 2009 . Tremors of nervous system   "I think I have parkinson's disease" .  Type 2 diabetes mellitus ([place])  . Wears dentures   uppers Past Surgical History: Past Surgical History: Procedure Laterality Date . CATARACT EXTRACTION W/PHACO Left 11/24/2015  Procedure: CATARACT EXTRACTION PHACO AND INTRAOCULAR LENS PLACEMENT (IOC);  Surgeon: Birder Robson, MD;  Location: [facility];  Service: Ophthalmology;   Laterality: Left;  [place] 38.3 AP% 20.6 CDE 7.89 FLUID PACK LOT # [id number] H . CATARACT EXTRACTION W/PHACO Right 06/23/2019  Procedure: CATARACT EXTRACTION PHACO AND INTRAOCULAR LENS PLACEMENT ([place]) RIGHT DIABETIC;  Surgeon: Birder Robson, MD;  Location: [place];  Service: Ophthalmology;  Laterality: Right;  Diabetic . COLONOSCOPY  2004, 2009, 2015  [place] . POLYPECTOMY  2009 . THYROIDECTOMY  2009  states she was tx with radiactive iodine . TUBAL LIGATION   . UPPER GASTROINTESTINAL ENDOSCOPY   HPI: Pt  is a 74 y.o. female with a medical history significant for Multiple issues including GERD, restless leg syndrome, essential tremors, HTN, DM, COPD, tobacco use, gout, depression/anxiety and chronic lower extremity edema/cellulitis.  Patient [facility]20 for leg edema and worsening shortness of breath and was found with O2 saturations in the 80's. She was found to have a BNP of 1113 and imaging showed mild cardiomegaly and pulmonary edema. Her lab work also showed worsening proteinuria. She was admitted for further work up.  per Pulmonary, pt has dx of Acute on chronic hypoxemic respiratory failure; BiPAP at night, on increased Rew O2 support during the day.  Pt has some degree of Cognitive decline - decreased attention and follow through w/ tasks w/out cues; often calling out Loudly.  CXR: Unchanged multifocal patchy airspace opacities within both lungs  which could be due to multifocal pneumonia.  Subjective: Bloody sputum on pt's chin and inside her mask when she arrived Assessment / Plan / Recommendation CHL IP CLINICAL IMPRESSIONS 04/19/2020 Clinical Impression Patient presents with moderately severe oropharyngeal dysphagia, with trace silent aspiration of thin and nectar thick liquids and laryngeal penetration of honey thick liquids and puree. When pt arrived to fluoro, she had coughed bloody-tinged secretions into her mask. Oral stage is characterized by sluggish, discoordinated  bolus formation, decreased bolus cohesion with premature spillage to the pharynx. Swallow initiation is delayed to the level of the pyriform sinuses, resulting in delayed laryngeal closure and penetration of all consistencies before the swallow, aspiration of thin and nectar thick liquids during the swallow which were not sensed by the patient. Penetration/aspiration appears to occur due to impaired timing/sensation vs pharyngeal weakness. She is only intermittently able to follow commands for throat clear/cough, which was marginally effective at clearing shallower penetration. There is intermittent mild residue in the oral cavity, with spillover occurring to the valleculae between frames. A chin tuck was attempted, with max cues required; this did not prevent penetration or aspiration (with nectar). Smaller (1/2-1 tsp) size boluses of thicker consistencies were better contained in the valleculae, reducing potential for aspiration from premature spill.  Recommend dys 1 (puree) and honey thick liquids by teaspoon, with assist for self-feeding and cuing for throat clearing intermittently to eject laryngeal penetration. Patient is at risk for aspiration regardless of consistency, although there may be some potential for improvement if her mental status improves. SLP Visit Diagnosis Dysphagia, oropharyngeal phase (R13.12) Attention and concentration deficit following -- Frontal lobe and executive function deficit following -- Impact on safety and function Moderate aspiration risk;Severe aspiration risk   CHL IP TREATMENT RECOMMENDATION 04/19/2020 Treatment Recommendations Therapy as outlined in treatment plan below   Prognosis 04/19/2020  Prognosis for Safe Diet Advancement Fair Barriers to Reach Goals Time post onset;Severity of deficits;Behavior Barriers/Prognosis Comment -- CHL IP DIET RECOMMENDATION 04/19/2020 SLP Diet Recommendations Dysphagia 1 (Puree) solids;Honey thick liquids Liquid Administration via Spoon  Medication Administration Crushed with puree Compensations Minimize environmental distractions;Slow rate;Small sips/bites;Lingual sweep for clearance of pocketing;Clear throat intermittently Postural Changes Seated upright at 90 degrees   CHL IP OTHER RECOMMENDATIONS 04/19/2020 Recommended Consults -- Oral Care Recommendations Oral care QID Other Recommendations Order thickener from pharmacy;Prohibited food (jello, ice cream, thin soups)   CHL IP FOLLOW UP RECOMMENDATIONS 04/19/2020 Follow up Recommendations Other (comment)   CHL IP FREQUENCY AND DURATION 04/19/2020 Speech Therapy Frequency (ACUTE ONLY) min 2x/week Treatment Duration 1 week      CHL IP ORAL PHASE 04/19/2020 Oral Phase Impaired Oral - Pudding Teaspoon -- Oral - Pudding Cup -- Oral - Honey Teaspoon Lingual/palatal residue;Decreased bolus cohesion;Premature spillage Oral - Honey Cup NT Oral - Nectar Teaspoon Lingual/palatal residue;Decreased bolus cohesion;Premature spillage Oral - Nectar Cup Lingual/palatal residue;Decreased bolus cohesion;Premature spillage Oral - Nectar Straw NT Oral - Thin Teaspoon Lingual/palatal residue;Decreased bolus cohesion;Premature spillage Oral - Thin Cup Lingual/palatal residue;Decreased bolus cohesion;Premature spillage Oral - Thin Straw NT Oral - Puree Lingual/palatal residue;Decreased bolus cohesion;Premature spillage Oral - Mech Soft -- Oral - Regular -- Oral - Multi-Consistency -- Oral - Pill -- Oral Phase - Comment --  CHL IP PHARYNGEAL PHASE 04/19/2020 Pharyngeal Phase Impaired Pharyngeal- Pudding Teaspoon -- Pharyngeal -- Pharyngeal- Pudding Cup -- Pharyngeal -- Pharyngeal- Honey Teaspoon Delayed swallow initiation-pyriform sinuses;Penetration/Aspiration before swallow Pharyngeal Material enters airway, remains ABOVE vocal cords then ejected out;Material enters airway, remains ABOVE vocal cords and not ejected out Pharyngeal- Honey Cup NT Pharyngeal -- Pharyngeal- Nectar Teaspoon Delayed swallow  initiation-pyriform sinuses;Penetration/Aspiration before swallow;Penetration/Aspiration during swallow;Trace aspiration;Compensatory strategies attempted (with notebox) Pharyngeal Material enters airway, remains ABOVE vocal cords and not ejected out;Material enters airway, passes BELOW cords without attempt by patient to eject out (silent aspiration) Pharyngeal- Nectar Cup Delayed swallow initiation-pyriform sinuses;Penetration/Aspiration before swallow;Penetration/Aspiration during swallow;Trace aspiration Pharyngeal Material enters airway, remains ABOVE vocal cords and not ejected out;Material enters airway, passes BELOW cords without attempt by patient to eject out (silent aspiration) Pharyngeal- Nectar Straw NT Pharyngeal -- Pharyngeal- Thin Teaspoon Delayed swallow initiation-pyriform sinuses;Penetration/Aspiration before swallow Pharyngeal Material enters airway, remains ABOVE vocal cords and not ejected out Pharyngeal- Thin Cup Delayed swallow initiation-pyriform sinuses;Penetration/Aspiration before swallow;Penetration/Aspiration during swallow;Trace aspiration Pharyngeal Material enters airway, remains ABOVE vocal cords and not ejected out;Material enters airway, passes BELOW cords without attempt by patient to eject out (silent aspiration) Pharyngeal- Thin Straw NT Pharyngeal -- Pharyngeal- Puree Delayed swallow initiation-pyriform sinuses;Penetration/Aspiration before swallow Pharyngeal Material enters airway, remains ABOVE vocal cords then ejected out;Material enters airway, remains ABOVE vocal cords and not ejected out Pharyngeal- Mechanical Soft -- Pharyngeal -- Pharyngeal- Regular -- Pharyngeal -- Pharyngeal- Multi-consistency -- Pharyngeal -- Pharyngeal- Pill -- Pharyngeal -- Pharyngeal Comment --  CHL IP CERVICAL ESOPHAGEAL PHASE 04/19/2020 Cervical Esophageal Phase WFL Pudding Teaspoon -- Pudding Cup -- Honey Teaspoon -- Honey Cup -- Nectar Teaspoon -- Nectar Cup -- Nectar Straw -- Thin Teaspoon  -- Thin Cup -- Thin Straw -- Puree -- Mechanical Soft -- Regular -- Multi-consistency -- Pill -- Cervical Esophageal Comment -- Martha Lever, MS, CCC-SLP Speech-Language Pathologist Martha [name] 04/19/2020, 12:17 PM              ECHOCARDIOGRAM COMPLETE  Result Date: 04/04/2020    ECHOCARDIOGRAM REPORT   Patient Name:   Martha [name]  Date of Exam: 04/04/2020 Medical Rec #:  [id number]      Height:       64.0 in Accession #:    [id number]     Weight:       181.8 lb Date of Birth:  07-Apr-1946     BSA:          1.879 m Patient Age:    21 years       BP:           171/78 mmHg Patient Gender: F              HR:           98 bpm. Exam Location:  [facility] Procedure: 2D Echo, Color Doppler, Cardiac Doppler and Intracardiac            Opacification Agent Indications:     I50.31 CHF-Acute Diastolic  History:         Patient has no prior history of Echocardiogram examinations.                  COPD, Signs/Symptoms:Shortness of Breath; Risk Factors:Diabetes                  and Current Smoker.  Sonographer:     Charmayne Sheer RDCS (AE) Referring Phys:  Baker Janus Soledad Gerlach NIU Diagnosing Phys: Bartholome Bill MD  Sonographer Comments: Technically difficult study due to poor echo windows. Image acquisition challenging due to patient body habitus and Image acquisition challenging due to COPD. IMPRESSIONS  1. Left ventricular ejection fraction, by estimation, is 55 to 60%. The left ventricle has normal function. The left ventricle has no regional wall motion abnormalities. Left ventricular diastolic parameters are consistent with Grade I diastolic dysfunction (impaired relaxation).  2. Right ventricular systolic function is normal. The right ventricular size is mildly enlarged.  3. Left atrial size was mildly dilated.  4. Right atrial size was mildly dilated.  5. The mitral valve was not well visualized. Trivial mitral valve regurgitation.  6. The aortic valve was not well visualized. Aortic valve regurgitation is trivial. FINDINGS  Left  Ventricle: Left ventricular ejection fraction, by estimation, is 55 to 60%. The left ventricle has normal function. The left ventricle has no regional wall motion abnormalities. Definity contrast agent was given IV to delineate the left ventricular  endocardial borders. The left ventricular internal cavity size was normal in size. There is borderline left ventricular hypertrophy. Left ventricular diastolic parameters are consistent with Grade I diastolic dysfunction (impaired relaxation). Right Ventricle: The right ventricular size is mildly enlarged. No increase in right ventricular wall thickness. Right ventricular systolic function is normal. Left Atrium: Left atrial size was mildly dilated. Right Atrium: Right atrial size was mildly dilated. Pericardium: There is no evidence of pericardial effusion. Mitral Valve: The mitral valve was not well visualized. Trivial mitral valve regurgitation. MV peak gradient, 7.4 mmHg. The mean mitral valve gradient is 3.0 mmHg. Tricuspid Valve: The tricuspid valve is not well visualized. Tricuspid valve regurgitation is trivial. Aortic Valve: The aortic valve was not well visualized. Aortic valve regurgitation is trivial. Aortic valve mean gradient measures 3.0 mmHg. Aortic valve peak gradient measures 6.8 mmHg. Aortic valve area, by VTI measures 2.46 cm. Pulmonic Valve: The pulmonic valve was not well visualized. Pulmonic valve regurgitation is not visualized. Aorta: The aortic root was not well visualized. IAS/Shunts: The interatrial septum was not assessed.  LEFT VENTRICLE PLAX 2D LVIDd:         4.15 cm  Diastology LVIDs:         3.64 cm     LV e' medial:    5.98 cm/s LV PW:         1.43 cm     LV E/e' medial:  18.3 LV IVS:        1.12 cm     LV e' lateral:   6.53 cm/s LVOT diam:     2.00 cm     LV E/e' lateral: 16.8 LV SV:         55 LV SV Index:   29 LVOT Area:     3.14 cm  LV Volumes (MOD) LV vol d, MOD A2C: 55.5 ml LV vol d, MOD A4C: 63.8 ml LV vol s, MOD A2C: 34.3 ml  LV vol s, MOD A4C: 23.4 ml LV SV MOD A2C:     21.2 ml LV SV MOD A4C:     63.8 ml LV SV MOD BP:      34.2 ml RIGHT VENTRICLE RV Basal diam:  3.76 cm LEFT ATRIUM             Index       RIGHT ATRIUM           Index LA diam:        4.90 cm 2.61 cm/m  RA Area:     19.80 cm LA Vol (A2C):   59.5 ml 31.67 ml/m RA Volume:   61.10 ml  32.52 ml/m LA Vol (A4C):   57.5 ml 30.61 ml/m LA Biplane Vol: 61.2 ml 32.58 ml/m  AORTIC VALVE                   PULMONIC VALVE AV Area (Vmax):    2.51 cm    PV Vmax:       0.76 m/s AV Area (Vmean):   2.43 cm    PV Vmean:      53.200 cm/s AV Area (VTI):     2.46 cm    PV VTI:        0.126 m AV Vmax:           130.00 cm/s PV Peak grad:  2.3 mmHg AV Vmean:          83.500 cm/s PV Mean grad:  1.0 mmHg AV VTI:            0.225 m AV Peak Grad:      6.8 mmHg AV Mean Grad:      3.0 mmHg LVOT Vmax:         104.00 cm/s LVOT Vmean:        64.700 cm/s LVOT VTI:          0.176 m LVOT/AV VTI ratio: 0.78  AORTA Ao Root diam: 3.10 cm MITRAL VALVE                TRICUSPID VALVE MV Area (PHT): 3.00 cm     TR Peak grad:   28.9 mmHg MV Peak grad:  7.4 mmHg     TR Vmax:        269.00 cm/s MV Mean grad:  3.0 mmHg MV Vmax:       1.36 m/s     SHUNTS MV Vmean:      86.9 cm/s    Systemic VTI:  0.18 m MV Decel Time: 253 msec     Systemic Diam: 2.00 cm MV E velocity: 109.50 cm/s MV A velocity: 137.50 cm/s MV E/A ratio:  0.80 Chrissie Noa  Fath MD Electronically signed by Bartholome Bill MD Signature Date/Time: 04/04/2020/4:54:02 PM    Final    US Abdomen Limited RUQ (LIVER/GB)  Result Date: 04/11/2020 CLINICAL DATA:  Hyperbilirubinemia in a 74 year old female EXAM: ULTRASOUND ABDOMEN LIMITED RIGHT UPPER QUADRANT COMPARISON:  April 07, 2020 CT angiography of the chest and CT of the chest of April 06, 2020 FINDINGS: Gallbladder: Wall thickness at upper limits of normal. No visible pericholecystic fluid or evidence of cholelithiasis. No reported tenderness over the gallbladder. Common bile duct: Diameter: 2.2 mm  Liver: Mildly heterogeneous echotexture with nodular contour and suggestion of fissural widening particularly on image 22. No visible lesion on submitted images. Portal vein is patent on color Doppler imaging with normal direction of blood flow towards the liver. Other: Trace ascites IMPRESSION: 1. Heterogeneous, mildly heterogeneous hepatic echotexture with question of lobular contour and fissural widening, findings could be seen in the setting of early liver disease. 2. Gallbladder wall thickness at upper limits of normal could be seen in the setting of liver disease and is nonspecific, not associated with biliary calculi, reported tenderness or biliary duct dilation. Electronically Signed   By: Zetta Bills M.D.   On: 04/11/2020 12:23       Subjective: Pt seen with daughter at bedside.  Pt denies SOB or pain/discomfort.  Discussed discharge comfort medications.   Discharge Exam: Vitals:   04/21/20 0410 04/21/20 0848  BP: (!) 159/51 (!) 140/48  Pulse: (!) 54 62  Resp: 17 16  Temp: 98.2 F (36.8 C) 97.9 F (36.6 C)  SpO2: 100% 90%   Vitals:   04/20/20 2019 04/21/20 0410 04/21/20 0411 04/21/20 0848  BP:  (!) 159/51  (!) 140/48  Pulse:  (!) 54  62  Resp:  17  16  Temp:  98.2 F (36.8 C)  97.9 F (36.6 C)  TempSrc:  Oral  Oral  SpO2: 92% 100%  90%  Weight:   65.3 kg   Height:        General: Pt is alert, awake, not in acute distress Cardiovascular: RRR, S1/S2 +, no rubs, no gallops Respiratory: CTA bilaterally, no wheezing, no rhonchi Abdominal: Soft, NT, ND Extremities: Unna boots on b/l LE's, moves all    The results of significant diagnostics from this hospitalization (including imaging, microbiology, ancillary and laboratory) are listed below for reference.     Microbiology: Recent Results (from the past 240 hour(s))  MRSA PCR Screening     Status: None   Collection Time: 04/12/20  3:25 PM   Specimen: Nasopharyngeal  Result Value Ref Range Status   MRSA by PCR  NEGATIVE NEGATIVE Final    Comment:        The GeneXpert MRSA Assay (FDA approved for NASAL specimens only), is one component of a comprehensive MRSA colonization surveillance program. It is not intended to diagnose MRSA infection nor to guide or monitor treatment for MRSA infections. Performed at [facility], [address] [place], [place] [address]      Labs: BNP (last 3 results) Recent Labs    04/09/20 0438 04/15/20 0417 04/17/20 0354  BNP 3,607.4* 1,371.4* 562.1*   Basic Metabolic Panel: Recent Labs  Lab 04/15/20 0417 04/16/20 0440 04/17/20 0354 04/18/20 0451 04/19/20 0544 04/20/20 0419  NA 142 137 132*  --  133*  --   K 4.4 3.9 3.5  --  3.1*  --   CL 101 95* 93*  --  94*  --   CO2 31 33* 31  --  31  --   GLUCOSE 98 109* 94  --  79  --   BUN 26* 27* 28*  --  20  --   CREATININE 0.50 0.42* 0.54  --  0.41*  --   CALCIUM 9.2 8.6* 8.4*  --  8.1*  --   MG 2.2 2.1 2.2 2.0 2.0 2.0   Liver Function Tests: Recent Labs  Lab 04/15/20 0417  AST 27  ALT 44  ALKPHOS 150*  BILITOT 1.2  PROT 6.0*  ALBUMIN 3.5   No results for input(s): LIPASE, AMYLASE in the last 168 hours. No results for input(s): AMMONIA in the last 168 hours. CBC: Recent Labs  Lab 04/15/20 0417 04/16/20 0440 04/17/20 0354 04/19/20 0544  WBC 14.0* 10.6* 11.7* 8.6  HGB 14.3 13.3 13.4 13.3  HCT 45.6 42.0 41.4 40.5  MCV 91.0 89.6 87.3 86.0  PLT 180 188 222 214   Cardiac Enzymes: No results for input(s): CKTOTAL, CKMB, CKMBINDEX, TROPONINI in the last 168 hours. BNP: Invalid input(s): POCBNP CBG: Recent Labs  Lab 04/19/20 1959 04/20/20 0805 04/20/20 1139 04/20/20 1615 04/21/20 0842  GLUCAP 162* 104* 209* 202* 136*   D-Dimer No results for input(s): DDIMER in the last 72 hours. Hgb A1c No results for input(s): HGBA1C in the last 72 hours. Lipid Profile No results for input(s): CHOL, HDL, LDLCALC, TRIG, CHOLHDL, LDLDIRECT in the last 72 hours. Thyroid function  studies No results for input(s): TSH, T4TOTAL, T3FREE, THYROIDAB in the last 72 hours.  Invalid input(s): FREET3 Anemia work up No results for input(s): VITAMINB12, FOLATE, FERRITIN, TIBC, IRON, RETICCTPCT in the last 72 hours. Urinalysis    Component Value Date/Time   COLORURINE YELLOW (A) 04/10/2020 1821   APPEARANCEUR CLOUDY (A) 04/10/2020 1821   LABSPEC 1.017 04/10/2020 1821   PHURINE 6.0 04/10/2020 1821   GLUCOSEU 50 (A) 04/10/2020 1821   HGBUR MODERATE (A) 04/10/2020 1821   BILIRUBINUR NEGATIVE 04/10/2020 1821   KETONESUR NEGATIVE 04/10/2020 1821   PROTEINUR >=300 (A) 04/10/2020 1821   NITRITE NEGATIVE 04/10/2020 1821   LEUKOCYTESUR TRACE (A) 04/10/2020 1821   Sepsis Labs Invalid input(s): PROCALCITONIN,  WBC,  LACTICIDVEN Microbiology Recent Results (from the past 240 hour(s))  MRSA PCR Screening     Status: None   Collection Time: 04/12/20  3:25 PM   Specimen: Nasopharyngeal  Result Value Ref Range Status   MRSA by PCR NEGATIVE NEGATIVE Final    Comment:        The GeneXpert MRSA Assay (FDA approved for NASAL specimens only), is one component of a comprehensive MRSA colonization surveillance program. It is not intended to diagnose MRSA infection nor to guide or monitor treatment for MRSA infections. Performed at [facility], [address] [place],  [address]      Time coordinating discharge: Over 30 minutes  SIGNED:   Ezekiel Slocumb, DO [facility] 04/21/2020, 10:33 AM   If 7PM-7AM, please contact night-coverage [email or web address]

## 2020-04-22 NOTE — Progress Notes (Deleted)
   Patient ID: Martha [name], female    DOB: [birth year], 74 y.o.   MRN: [id number]  HPI  Martha [name] is a 74 y/o female with a history of  Echo report from 04/04/20 reviewed and showed an EF of 55-60% along with mild LAE and trivial MR.   Admitted 04/03/20 due to                                Palliative care, ID, wound, pulmonology and cardiology consults obtained.           Discharged after 18 days.   Review of Systems    Physical Exam    Assessment & Plan:  1: Chronic heart failure with preserved ejection fraction with structural changes (mild LAE) - NYHA class

## 2020-04-25 ENCOUNTER — Ambulatory Visit: Payer: Medicare Other | Admitting: Family

## 2020-05-05 ENCOUNTER — Ambulatory Visit: Payer: Medicare Other | Admitting: Hematology and Oncology

## 2020-05-05 ENCOUNTER — Other Ambulatory Visit: Payer: Medicare Other

## 2020-05-13 ENCOUNTER — Telehealth: Payer: Self-pay | Admitting: Family

## 2020-05-13 NOTE — Telephone Encounter (Signed)
Spoke to patients husband regarding a no show new patient appointment with the [facility] in attempt to reschedule but husband stated she is bed bound and is able to attend appointments and they do not need our services.   Martha [name], NT

## 2020-06-13 ENCOUNTER — Other Ambulatory Visit: Payer: Self-pay | Admitting: Oncology

## 2020-06-13 DIAGNOSIS — Z87891 Personal history of nicotine dependence: Secondary | ICD-10-CM

## 2020-06-13 DIAGNOSIS — Z122 Encounter for screening for malignant neoplasm of respiratory organs: Secondary | ICD-10-CM

## 2020-08-28 DEATH — deceased
